# Patient Record
Sex: Female | Born: 1990 | Race: Black or African American | Hispanic: No | Marital: Single | Smoking: Former smoker
Health system: Southern US, Community
[De-identification: ages and names within clinical notes are randomized; demographics above are authoritative.]

## PROBLEM LIST (undated history)

## (undated) DIAGNOSIS — R12 Heartburn: Secondary | ICD-10-CM

## (undated) DIAGNOSIS — G47411 Narcolepsy with cataplexy: Secondary | ICD-10-CM

## (undated) DIAGNOSIS — K219 Gastro-esophageal reflux disease without esophagitis: Secondary | ICD-10-CM

## (undated) DIAGNOSIS — E6609 Other obesity due to excess calories: Secondary | ICD-10-CM

## (undated) DIAGNOSIS — G4730 Sleep apnea, unspecified: Secondary | ICD-10-CM

## (undated) DIAGNOSIS — R0602 Shortness of breath: Secondary | ICD-10-CM

## (undated) DIAGNOSIS — F419 Anxiety disorder, unspecified: Secondary | ICD-10-CM

## (undated) DIAGNOSIS — D649 Anemia, unspecified: Secondary | ICD-10-CM

## (undated) DIAGNOSIS — Z72821 Inadequate sleep hygiene: Secondary | ICD-10-CM

## (undated) DIAGNOSIS — R079 Chest pain, unspecified: Secondary | ICD-10-CM

## (undated) DIAGNOSIS — G4710 Hypersomnia, unspecified: Secondary | ICD-10-CM

## (undated) DIAGNOSIS — M549 Dorsalgia, unspecified: Secondary | ICD-10-CM

## (undated) DIAGNOSIS — F32A Depression, unspecified: Secondary | ICD-10-CM

## (undated) DIAGNOSIS — R609 Edema, unspecified: Secondary | ICD-10-CM

## (undated) DIAGNOSIS — D689 Coagulation defect, unspecified: Secondary | ICD-10-CM

## (undated) DIAGNOSIS — F329 Major depressive disorder, single episode, unspecified: Secondary | ICD-10-CM

## (undated) DIAGNOSIS — K5900 Constipation, unspecified: Secondary | ICD-10-CM

## (undated) DIAGNOSIS — F909 Attention-deficit hyperactivity disorder, unspecified type: Secondary | ICD-10-CM

## (undated) DIAGNOSIS — Z86718 Personal history of other venous thrombosis and embolism: Secondary | ICD-10-CM

## (undated) HISTORY — DX: Anxiety disorder, unspecified: F41.9

## (undated) HISTORY — DX: Other obesity due to excess calories: E66.09

## (undated) HISTORY — DX: Hypersomnia, unspecified: G47.10

## (undated) HISTORY — DX: Coagulation defect, unspecified: D68.9

## (undated) HISTORY — DX: Personal history of other venous thrombosis and embolism: Z86.718

## (undated) HISTORY — DX: Sleep apnea, unspecified: G47.30

## (undated) HISTORY — DX: Constipation, unspecified: K59.00

## (undated) HISTORY — DX: Chest pain, unspecified: R07.9

## (undated) HISTORY — DX: Narcolepsy with cataplexy: G47.411

## (undated) HISTORY — DX: Edema, unspecified: R60.9

## (undated) HISTORY — DX: Anemia, unspecified: D64.9

## (undated) HISTORY — DX: Shortness of breath: R06.02

## (undated) HISTORY — DX: Heartburn: R12

## (undated) HISTORY — DX: Dorsalgia, unspecified: M54.9

## (undated) HISTORY — PX: OTHER SURGICAL HISTORY: SHX169

## (undated) HISTORY — DX: Inadequate sleep hygiene: Z72.821

---
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
## 2008-01-20 ENCOUNTER — Telehealth: Payer: Self-pay | Admitting: *Deleted

## 2008-01-21 ENCOUNTER — Ambulatory Visit: Payer: Self-pay | Admitting: Family Medicine

## 2008-01-21 ENCOUNTER — Encounter (INDEPENDENT_AMBULATORY_CARE_PROVIDER_SITE_OTHER): Payer: Self-pay | Admitting: Family Medicine

## 2008-01-28 ENCOUNTER — Ambulatory Visit: Payer: Self-pay | Admitting: Internal Medicine

## 2008-03-01 ENCOUNTER — Encounter: Payer: Self-pay | Admitting: Internal Medicine

## 2008-03-15 ENCOUNTER — Ambulatory Visit: Payer: Self-pay | Admitting: Internal Medicine

## 2008-03-15 LAB — CONVERTED CEMR LAB
T4, Total: 9.6 ug/dL (ref 5.0–12.5)
TSH: 1.17 microintl units/mL (ref 0.35–5.50)

## 2008-07-15 ENCOUNTER — Ambulatory Visit: Payer: Self-pay | Admitting: Internal Medicine

## 2008-09-19 ENCOUNTER — Telehealth: Payer: Self-pay | Admitting: Internal Medicine

## 2009-03-17 ENCOUNTER — Inpatient Hospital Stay (HOSPITAL_COMMUNITY): Admission: AD | Admit: 2009-03-17 | Discharge: 2009-03-19 | Payer: Self-pay | Admitting: Family Medicine

## 2009-03-17 ENCOUNTER — Telehealth: Payer: Self-pay | Admitting: Family Medicine

## 2009-03-17 ENCOUNTER — Ambulatory Visit: Payer: Self-pay | Admitting: Family Medicine

## 2009-03-17 DIAGNOSIS — N920 Excessive and frequent menstruation with regular cycle: Secondary | ICD-10-CM | POA: Insufficient documentation

## 2009-03-17 LAB — CONVERTED CEMR LAB: Hemoglobin: 4.6 g/dL

## 2009-03-23 ENCOUNTER — Telehealth (INDEPENDENT_AMBULATORY_CARE_PROVIDER_SITE_OTHER): Payer: Self-pay | Admitting: Family Medicine

## 2009-03-24 ENCOUNTER — Ambulatory Visit: Payer: Self-pay | Admitting: Family Medicine

## 2009-03-24 DIAGNOSIS — K219 Gastro-esophageal reflux disease without esophagitis: Secondary | ICD-10-CM

## 2009-03-27 ENCOUNTER — Ambulatory Visit (HOSPITAL_COMMUNITY): Admission: RE | Admit: 2009-03-27 | Discharge: 2009-03-27 | Payer: Self-pay | Admitting: Family Medicine

## 2009-03-28 ENCOUNTER — Encounter (INDEPENDENT_AMBULATORY_CARE_PROVIDER_SITE_OTHER): Payer: Self-pay | Admitting: Family Medicine

## 2009-10-27 ENCOUNTER — Encounter: Payer: Self-pay | Admitting: Family Medicine

## 2009-10-27 ENCOUNTER — Ambulatory Visit: Payer: Self-pay | Admitting: Family Medicine

## 2009-10-27 LAB — CONVERTED CEMR LAB
Hemoglobin: 12.3 g/dL
TSH: 1.349 microintl units/mL (ref 0.700–6.400)

## 2009-12-27 ENCOUNTER — Ambulatory Visit: Payer: Self-pay | Admitting: Family Medicine

## 2010-02-13 ENCOUNTER — Encounter: Payer: Self-pay | Admitting: Family Medicine

## 2010-02-14 ENCOUNTER — Ambulatory Visit: Payer: Self-pay | Admitting: Family Medicine

## 2010-02-14 ENCOUNTER — Encounter: Payer: Self-pay | Admitting: Family Medicine

## 2010-02-14 LAB — CONVERTED CEMR LAB
Barbiturate Quant, Ur: NEGATIVE
Creatinine,U: 89.1 mg/dL
Ethyl Alcohol: <10 mg/dL (ref <10)
Methadone: NEGATIVE
Opiate Screen, Urine: NEGATIVE

## 2010-03-05 ENCOUNTER — Telehealth: Payer: Self-pay | Admitting: *Deleted

## 2010-03-09 ENCOUNTER — Encounter: Payer: Self-pay | Admitting: Family Medicine

## 2010-03-09 ENCOUNTER — Ambulatory Visit: Payer: Self-pay | Admitting: Family Medicine

## 2010-03-09 DIAGNOSIS — D500 Iron deficiency anemia secondary to blood loss (chronic): Principal | ICD-10-CM | POA: Diagnosis present

## 2010-03-09 DIAGNOSIS — D509 Iron deficiency anemia, unspecified: Secondary | ICD-10-CM | POA: Insufficient documentation

## 2010-03-09 DIAGNOSIS — R358 Other polyuria: Secondary | ICD-10-CM

## 2010-03-13 LAB — CONVERTED CEMR LAB
Ferritin: 11 ng/mL (ref 10–291)
Hemoglobin: 9.2 g/dL — ABNORMAL LOW (ref 12.0–15.0)
MCHC: 31.1 g/dL (ref 30.0–36.0)
MCV: 72.5 fL — ABNORMAL LOW (ref 78.0–100.0)
RBC: 4.08 M/uL (ref 3.87–5.11)
TIBC: 308 ug/dL (ref 250–470)
UIBC: 294 ug/dL
WBC: 6.9 10*3/uL (ref 4.0–10.5)

## 2010-03-28 ENCOUNTER — Telehealth: Payer: Self-pay | Admitting: Family Medicine

## 2010-04-11 ENCOUNTER — Ambulatory Visit: Payer: Self-pay | Admitting: Family Medicine

## 2010-04-11 DIAGNOSIS — J309 Allergic rhinitis, unspecified: Secondary | ICD-10-CM

## 2010-04-21 ENCOUNTER — Ambulatory Visit: Payer: Self-pay | Admitting: Internal Medicine

## 2010-04-23 ENCOUNTER — Telehealth: Payer: Self-pay | Admitting: Family Medicine

## 2010-04-24 ENCOUNTER — Ambulatory Visit (HOSPITAL_BASED_OUTPATIENT_CLINIC_OR_DEPARTMENT_OTHER): Admission: RE | Admit: 2010-04-24 | Discharge: 2010-04-24 | Payer: Self-pay | Admitting: Internal Medicine

## 2010-04-24 ENCOUNTER — Ambulatory Visit: Payer: Self-pay | Admitting: Internal Medicine

## 2010-05-21 ENCOUNTER — Telehealth: Payer: Self-pay | Admitting: Family Medicine

## 2010-05-25 ENCOUNTER — Ambulatory Visit: Payer: Self-pay | Admitting: Internal Medicine

## 2010-06-28 ENCOUNTER — Ambulatory Visit: Payer: Self-pay | Admitting: Internal Medicine

## 2010-06-29 ENCOUNTER — Telehealth (INDEPENDENT_AMBULATORY_CARE_PROVIDER_SITE_OTHER): Payer: Self-pay | Admitting: *Deleted

## 2010-07-05 ENCOUNTER — Encounter: Payer: Self-pay | Admitting: Internal Medicine

## 2010-07-05 ENCOUNTER — Ambulatory Visit: Payer: Self-pay | Admitting: Family Medicine

## 2010-07-05 DIAGNOSIS — J069 Acute upper respiratory infection, unspecified: Secondary | ICD-10-CM | POA: Insufficient documentation

## 2010-08-28 ENCOUNTER — Telehealth: Payer: Self-pay | Admitting: Internal Medicine

## 2011-01-17 NOTE — Progress Notes (Signed)
Summary: refill  Phone Note Refill Request Call back at [phone] Message from:  Ms Sharee Pimple  Refills Requested: Medication #1:  METHYLPHENIDATE HCL CR 20 MG  TBCR 3 tabs Initial call taken by: De Nurse,  Apr 23, 2010 11:13 AM  Follow-up for Phone Call        will leave up front by noon today Follow-up by: Asher Muir MD,  Apr 24, 2010 10:10 AM  Additional Follow-up for Phone Call Additional follow up Details #1::        informed Ms Sharee Pimple Additional Follow-up by: De Nurse,  Apr 24, 2010 4:44 PM    Prescriptions: METHYLPHENIDATE HCL CR 20 MG  TBCR (METHYLPHENIDATE HCL) 3 tabs, twice daily as needed Brand medically necessary #60 x 0   Entered and Authorized by:   Asher Muir MD   Signed by:   Asher Muir MD on 04/24/2010   Method used:   Print then Give to Patient   RxID:   [id number]

## 2011-01-17 NOTE — Progress Notes (Signed)
Summary: Rx Req  Phone Note Refill Request Call back at [phone] Message from:  Patient  Refills Requested: Medication #1:  METHYLPHENIDATE HCL CR 20 MG  TBCR 3 tabs Initial call taken by: Clydell Hakim,  May 21, 2010 8:52 AM  Follow-up for Phone Call        Prescription placed at front desk.  Please call to inform patient. Follow-up by: Romero Belling MD,  May 21, 2010 1:36 PM  Additional Follow-up for Phone Call Additional follow up Details #1::        pt called in and informed it was ready Additional Follow-up by: Knox Royalty,  May 21, 2010 2:23 PM    Prescriptions: METHYLPHENIDATE HCL CR 20 MG  TBCR (METHYLPHENIDATE HCL) 3 tabs, twice daily as needed Brand medically necessary #60 x 0   Entered and Authorized by:   Romero Belling MD   Signed by:   Romero Belling MD on 05/21/2010   Method used:   Print then Give to Patient   RxID:   [id number]

## 2011-01-17 NOTE — Miscellaneous (Signed)
Summary: RX Refill  Patient stopped in and needs a refill of Methylphenidate.  She uses [facility] on [address].  Her phone number is [phone]. Bradly Bienenstock  February 13, 2010 1:12 PM  Will refill meds when back in office.  She takes Methylphenidate for Narcolepsy.  Dr. Roxy Cedar note from 06/2008 indicates he felt she needed a repeat sleep study in a couple of years.  This is due at this point, so will set up for patient.  Romero Belling MD  February 13, 2010 9:22 PM  Would like patient to come to clinic to sign Controlled Substances Contract if she is going to have these medicines filled regularly from now on.  Called number above and left message for patient to call the office.  Patient just called back and we discussed Controlled Substances Contract--she is agreeable and will come to my office at 4 pm today for clinic visit to go over contract.  To Admin Team to schedule visit.  ADMIN TEAM:  Please put Ms. Messineo on my schedule for 4 pm today.  Okay to double book.  Visit is for:  "Controlled Substances Contract, Sleep Study."  Thank you. Romero Belling MD  February 14, 2010 9:00 AM

## 2011-01-17 NOTE — Progress Notes (Signed)
Summary: nos appt  Phone Note Call from Patient   Caller: [email or web address]  Call For: Andrika Peraza Summary of Call: In ref to nos from 9/12, pt will rsc at a later date. Initial call taken by: Darletta Moll,  August 28, 2010 9:13 AM
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
## 2011-01-17 NOTE — Assessment & Plan Note (Signed)
Summary: Controlled Substances Contract, Sleep Study,df   Vital Signs:  Patient profile:   20 year old female Height:      62.5 inches Weight:      318 pounds BMI:     57.44 BSA:     2.34 Temp:     98.1 degrees F Pulse rate:   111 / minute BP sitting:   126 / 82  Vitals Entered By: Jone Baseman CMA (February 14, 2010 4:31 PM) CC: Controlled Substances Contract, Sleep Study Is Patient Diabetic? No Pain Assessment Patient in pain? no        Primary Care Provider:  Romero Belling MD  CC:  Controlled Substances Contract and Sleep Study.  History of Present Illness: 20 yo female presenting for refill of Methylphenidate, which she takes for Narcolepsy and apparently ADHD, though that history is less clear.  Review of notes indicates has been seen by sleep medicine specialist in the past who felt she would need a re-evaluation in the future.  Methylphenidate is prescribed for up to two times a day, but she is taking typically once daily.  Habits & Providers  Alcohol-Tobacco-Diet     Tobacco Status: never  Current Problems (verified): 1)  Narcolepsy w/o Cataplexy  (ICD-347.00) 2)  Esophageal Reflux  (ICD-530.81) 3)  Obesity, Morbid  (ICD-278.01) 4)  Menorrhagia  (ICD-626.2) 5)  Depressive Disorder, Nos  (ICD-311) 6)  Attention Deficit, w/o Hyperactivity  (ICD-314.00)  Current Medications (verified): 1)  Methylphenidate Hcl Cr 20 Mg  Tbcr (Methylphenidate Hcl) .... 3 Tabs, Twice Daily As Needed 2)  Omeprazole 20 Mg Cpdr (Omeprazole) .Marland Kitchen.. 1 Tab By Mouth Daily For Acid Reflux  Allergies (verified): No Known Drug Allergies  Physical Exam  General:  Alert and oriented x3, obese, no acute distress Psych:  flat affect.     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  Contract is signed today and will be scanned into EMR.  UDS  today as well.  Discussed need for re-evaluation of narcolepsy with patient who is agreeable to referral to sleep clinic--will arrange today.  Medications filled x1 month. Orders: Miscellaneous Lab Charge-[facility] [phone]) [facility]- Est Level  2 (60454) Sleep Disorder Referral (Sleep Disorder)  Complete Medication List: 1)  Methylphenidate Hcl Cr 20 Mg Tbcr (Methylphenidate hcl) .... 3 tabs, twice daily as needed 2)  Omeprazole 20 Mg Cpdr (Omeprazole) .Marland Kitchen.. 1 tab by mouth daily for acid reflux  Patient Instructions: 1)  We'll call with sleep appointment. 2)  Please schedule a follow-up appointment in 3 months.  Prescriptions: METHYLPHENIDATE HCL CR 20 MG  TBCR (METHYLPHENIDATE HCL) 3 tabs, twice daily as needed Brand medically necessary #60 x 0   Entered and Authorized by:   Romero Belling MD   Signed by:   Romero Belling MD on 02/14/2010   Method used:   Print then Give to Patient   RxID:   [id number]

## 2011-01-17 NOTE — Miscellaneous (Signed)
Summary: [facility] Controlled Substance Contract  [facility] Controlled Substance Contract   Imported By: Clydell Hakim 02/16/2010 12:08:44  _____________________________________________________________________  External Attachment:    Type:   Image     Comment:   External Document
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
## 2011-01-17 NOTE — Progress Notes (Signed)
Summary: Nuvigil Prior Auth----approved!!  Phone Note Outgoing Call   Call placed by: Gweneth Dimitri RN,  June 29, 2010 11:53 AM Summary of Call: Received request from pharmacy to initiate prior auth for Nuvigil 250mg .  Called [employer] to initiate this.  Awaiting fax to traige.   Initial call taken by: Gweneth Dimitri RN,  June 29, 2010 11:54 AM  Follow-up for Phone Call        PA has been faxed and in awaiting answer stack in Triage.Reynaldo Minium CMA  June 29, 2010 1:54 PM   Additional Follow-up for Phone Call Additional follow up Details #1::        received paperwork back from [facility] company approving Nuvigil.  Arman Filter LPN  July 02, 2010 5:26 PM

## 2011-01-17 NOTE — Assessment & Plan Note (Signed)
Summary: fu/kh   Vital Signs:  Patient profile:   20 year old female Height:      62.5 inches Weight:      324.8 pounds BMI:     58.67 Temp:     98.3 degrees F oral Pulse rate:   96 / minute BP sitting:   95 / 60  (left arm) Cuff size:   large  Vitals Entered By: Garen Grams LPN (April 11, 2010 11:35 AM) CC: f/u menorrhagia Is Patient Diabetic? No Pain Assessment Patient in pain? no        Primary Care Provider:  Romero Belling MD  CC:  f/u menorrhagia.  History of Present Illness: Seen 1 month ago for menorrhagia and found to be anemic.  Took OCPs as directed:  2 daily x3 days, then 1 daily.  Period had been going on for 3 weeks when she was seen last time, but stopped when she started the OCPs.  Started again sometime later (unsure when) and has been going on for approximately 2 weeks now, approximately 3 pads per day, which she says is like last time.  Is also taking Fe two times a day.  Feels nauseated on the new medicines.  Not sexually active.  Habits & Providers  Alcohol-Tobacco-Diet     Tobacco Status: never  Allergies (verified): No Known Drug Allergies  Review of Systems       Per HPI.  Physical Exam  General:  Well-developed,well-nourished,in no acute distress; alert,appropriate and cooperative throughout examination Abdomen:  Bowel sounds positive,abdomen soft and non-tender without masses, organomegaly or hernias noted.   Impression & Recommendations:  Problem # 1:  MENORRHAGIA (ICD-626.2) Assessment Unchanged  Will do trial of OCPs as follows.  Advised that Mirena would be a good option--patient declines. Her updated medication list for this problem includes:    Ortho-cyclen (28) 0.25-35 Mg-mcg Tabs (Norgestimate-eth estradiol) .Marland KitchenMarland KitchenMarland KitchenMarland Kitchen 3 tab by mouth daily for one week on the first 7 days of the month for the next 3 months. disp #1 pack  Orders: [facility]- Est Level  3 (16109)  Problem # 2:  ANEMIA (ICD-285.9) Assessment: Unchanged  Iron deficient  secondary to menorrhagia.  Continue Fe supplement. Her updated medication list for this problem includes:    Ferrous Sulfate 325 (65 Fe) Mg Tabs (Ferrous sulfate) .Marland Kitchen..Marland Kitchen Two times a day  Hgb: 9.2 (03/09/2010)   Hct: 29.6 (03/09/2010)   Platelets: 351 (03/09/2010) RBC: 4.08 (03/09/2010)   RDW: 16.9 (03/09/2010)   WBC: 6.9 (03/09/2010) MCV: 72.5 (03/09/2010)   MCHC: 31.1 (03/09/2010) Ferritin: 11 (03/09/2010) Iron: 14 (03/09/2010)   TIBC: 308 (03/09/2010)   % Sat: 5 (03/09/2010) TSH: 1.349 (10/27/2009)  Orders: [facility]- Est Level  3 (60454)  Complete Medication List: 1)  Methylphenidate Hcl Cr 20 Mg Tbcr (Methylphenidate hcl) .... 3 tabs, twice daily as needed 2)  Omeprazole 20 Mg Cpdr (Omeprazole) .Marland Kitchen.. 1 tab by mouth daily for acid reflux 3)  Ortho-cyclen (28) 0.25-35 Mg-mcg Tabs (Norgestimate-eth estradiol) .... 3 tab by mouth daily for one week on the first 7 days of the month for the next 3 months. disp #1 pack 4)  Ferrous Sulfate 325 (65 Fe) Mg Tabs (Ferrous sulfate) .... Two times a day 5)  Naprosyn 500 Mg Tabs (Naproxen) .... One tab two times a day as needed for headache  Patient Instructions: 1)  Stop taking the other birth control pills. 2)  Take the new ones I just prescribed:  3 tabs daily for 1 week beginning May 1.  Do this again on June 1, and again on July 1. 3)  Consider having a Mirena IUD inserted. 4)  Please schedule a follow-up appointment in 2 months.  Prescriptions: ORTHO-CYCLEN (28) 0.25-35 MG-MCG TABS (NORGESTIMATE-ETH ESTRADIOL) 3 tab by mouth daily for one week on the first 7 days of the month for the next 3 months. Disp #1 pack  #1 x 2   Entered and Authorized by:   Romero Belling MD   Signed by:   Romero Belling MD on 04/11/2010   Method used:   [facility]        [facility] [address] [phone]* (retail)       [address]       [place], [place]  [address]       Ph: [phone] or [phone]       Fax: [phone]   RxID:    [id number]   Appended Document: fu/kh     Allergies: No Known Drug Allergies   Impression & Recommendations:  Problem # 1:  ALLERGIC RHINITIS (ICD-477.9)  Patient asks for medicine for allergies as she walks out the door today.  It's in her nose and throat.  Not evaluated.  Rx for Cetirizine sent.  Her updated medication list for this problem includes:    Cetirizine Hcl 10 Mg Tabs (Cetirizine hcl) .Marland Kitchen... 1 tab by mouth daily for allergies  Complete Medication List: 1)  Methylphenidate Hcl Cr 20 Mg Tbcr (Methylphenidate hcl) .... 3 tabs, twice daily as needed 2)  Omeprazole 20 Mg Cpdr (Omeprazole) .Marland Kitchen.. 1 tab by mouth daily for acid reflux 3)  Ortho-cyclen (28) 0.25-35 Mg-mcg Tabs (Norgestimate-eth estradiol) .... 3 tab by mouth daily for one week on the first 7 days of the month for the next 3 months. disp #1 pack 4)  Ferrous Sulfate 325 (65 Fe) Mg Tabs (Ferrous sulfate) .... Two times a day 5)  Naprosyn 500 Mg Tabs (Naproxen) .... One tab two times a day as needed for headache 6)  Cetirizine Hcl 10 Mg Tabs (Cetirizine hcl) .Marland Kitchen.. 1 tab by mouth daily for allergies Prescriptions: CETIRIZINE HCL 10 MG TABS (CETIRIZINE HCL) 1 tab by mouth daily for allergies  #30 x 6   Entered and Authorized by:   Romero Belling MD   Signed by:   Romero Belling MD on 04/11/2010   Method used:   [facility]ron[facility] to        [facility] [address] [phone]* (retail)       [address]       [place], [place]  [address]       Ph: [phone] or [phone]       Fax: [phone]   RxID:   [id number]
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
## 2011-01-17 NOTE — Progress Notes (Signed)
Summary: Cancel Sleep Study  Phone Note Call from Patient Call back at [phone]   Caller: Patient Summary of Call: Would like to cancel sleep study and have it rescheduled. Initial call taken by: Clydell Hakim,  March 05, 2010 2:56 PM  Follow-up for Phone Call        Called patient and gave her the number to reschedule this herself. Follow-up by: Garen Grams LPN,  March 05, 2010 3:25 PM

## 2011-01-17 NOTE — Progress Notes (Signed)
Summary: Rx Req  Phone Note Refill Request Call back at [phone] Message from:  Caregiver-Blondine  Refills Requested: Medication #1:  METHYLPHENIDATE HCL CR 20 MG  TBCR 3 tabs Initial call taken by: Clydell Hakim,  March 28, 2010 9:55 AM  Follow-up for Phone Call        Rx at front desk. Follow-up by: Romero Belling MD,  March 28, 2010 10:09 AM    Prescriptions: METHYLPHENIDATE HCL CR 20 MG  TBCR (METHYLPHENIDATE HCL) 3 tabs, twice daily as needed Brand medically necessary #60 x 0   Entered and Authorized by:   Romero Belling MD   Signed by:   Romero Belling MD on 03/28/2010   Method used:   Print then Give to Patient   RxID:   [id number]   Appended Document: Rx Req notified rx was ready to be picked up.

## 2011-01-17 NOTE — Assessment & Plan Note (Signed)
Summary: headache,df   Vital Signs:  Patient profile:   20 year old female Height:      62.5 inches Weight:      320.4 pounds BMI:     57.88 Temp:     98.2 degrees F oral Pulse rate:   90 / minute Resp:     18 per minute BP sitting:   128 / 80  Primary Care Provider:  Romero Belling MD   History of Present Illness: Vaginal bleeding for one month, feeling fatigued and has had a headache for over one week.  Past hospitalization for anemia, and was started on OCP to regulate menses she quit taking because she could not remember.   Living in a transitional home, the house mother was here and very positive, reported that [name] does very little except eat and sleep.  She occasionally walks to the corner store to buy a 2 liter soda and drinks it all.  Reports a significant history of depression, not currently on antidepressants.  Is on stimulants for 'narcolepsy" that she does not take regularly.  Just spent a week with her mother.  She denies any sexual acitivity.  Current Medications (verified): 1)  Methylphenidate Hcl Cr 20 Mg  Tbcr (Methylphenidate Hcl) .... 3 Tabs, Twice Daily As Needed 2)  Omeprazole 20 Mg Cpdr (Omeprazole) .Marland Kitchen.. 1 Tab By Mouth Daily For Acid Reflux 3)  Loestrin 24 Fe 1-20 Mg-Mcg Tabs (Norethin Ace-Eth Estrad-Fe) .... One Tab Two Times A Day For 3 Days Then One Tab Daily, Please Stay On The Pill To Contol Bleeding 4)  Ferrous Sulfate 325 (65 Fe) Mg Tabs (Ferrous Sulfate) .... Two Times A Day 5)  Naprosyn 500 Mg Tabs (Naproxen) .... One Tab Two Times A Day As Needed For Headache  Allergies (verified): No Known Drug Allergies  Review of Systems General:  Complains of fatigue and malaise. Neuro:  Complains of headaches.  Physical Exam  General:  Lethargic, morbidly obese 20 year old. Eyes:  pale lid conjunctive Mouth:  moist Lungs:  normal respiratory effort and normal breath sounds.   Heart:  rate 90, regular   Impression & Recommendations:  Problem #  1:  MENORRHAGIA (ICD-626.2) Restart OCP, add low estrogen with iron, check indicies-hbg was 9.9 today, add iron for 6 months Her updated medication list for this problem includes:    Loestrin 24 Fe 1-20 Mg-mcg Tabs (Norethin ace-eth estrad-fe) ..... 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 3 tabs, twice daily as needed 2)  Omeprazole 20 Mg Cpdr (Omeprazole) .Marland Kitchen.. 1 tab by mouth daily for acid reflux 3)  Loestrin 24 Fe 1-20 Mg-mcg Tabs (Norethin ace-eth estrad-fe) .... One tab two times a day for 3 days then one tab daily, please stay on the pill to contol  bleeding 4)  Ferrous Sulfate 325 (65 Fe) Mg Tabs (Ferrous sulfate) .... Two times a day 5)  Naprosyn 500 Mg Tabs (Naproxen) .... One tab two times a day as needed for headache  Patient Instructions: 1)  It is important that you take the hormone to control your bleeding, low blood count is dangerous and if it continue could hurt you 2)  I am sure that you feel bad because of this low blood count and it could be causing the headache 3)  You have medicine for your headache 4)  you need to take iron for 6 months. 5)  Follow up with primary MD in one month Prescriptions: NAPROSYN 500 MG TABS (NAPROXEN) one tab two times a day  as needed for headache  #60 x 1   Entered and Authorized by:   Luretha Murphy NP   Signed by:   Luretha Murphy NP on 03/09/2010   Method used:   Electronically to        [facility] [address] [phone]* (retail)       [address]       [place], [place]  [address]       Ph: [phone] or [phone]       Fax: [phone]   RxID:   [id number] FERROUS SULFATE 325 (65 FE) MG TABS (FERROUS SULFATE) two times a day  #60 x 5   Entered and Authorized by:   Luretha Murphy NP   Signed by:   Luretha Murphy NP on 03/09/2010   Method used:   Electronically to        [facility] [address] [phone]* (retail)       [address]       [place], [place]  [address]       Ph: [phone] or [phone]       Fax: [phone]   RxID:   [id number] LOESTRIN 24 FE 1-20 MG-MCG TABS (NORETHIN ACE-ETH ESTRAD-FE) one tab two times a day for 3 days then one tab daily, please stay on the pill to contol bleeding  #1 x 11   Entered and Authorized by:   Luretha Murphy NP   Signed by:   Luretha Murphy NP on 03/09/2010   Method used:   Electronically to        [facility] [address] [phone]* (retail)       [address]       [place], [place]  [address]       Ph: [phone] or [phone]       Fax: [phone]   RxID:   [id number]   Laboratory Results   Blood Tests   Date/Time Received: March 09, 2010 4:08 PM  Date/Time Reported: March 09, 2010 4:16 PM     CBC   HGB:  9.9 g/dL   (Normal Range: 78.9-38.1 in Males, 12.0-15.0 in Females) Comments: ...........test performed by...........Marland KitchenTerese Door, CMA

## 2011-01-17 NOTE — Medication Information (Signed)
Summary: Tax adviser   Imported By: Valinda Hoar 07/05/2010 08:22:57  _____________________________________________________________________  External Attachment:    Type:   Image     Comment:   External Document

## 2011-03-11 LAB — GLUCOSE, CAPILLARY: Glucose-Capillary: 91 mg/dL (ref 70–99)

## 2011-03-27 LAB — CROSSMATCH

## 2011-03-27 LAB — PREGNANCY, URINE: Preg Test, Ur: NEGATIVE

## 2011-03-27 LAB — DIFFERENTIAL
Basophils Absolute: 0.1 10*3/uL (ref 0.0–0.1)
Eosinophils Absolute: 0.2 10*3/uL (ref 0.0–0.7)
Eosinophils Relative: 3 % (ref 0–5)
Lymphocytes Relative: 34 % (ref 12–46)
Lymphs Abs: 2.9 10*3/uL (ref 0.7–4.0)
Monocytes Absolute: 0.7 10*3/uL (ref 0.1–1.0)

## 2011-03-27 LAB — COMPREHENSIVE METABOLIC PANEL
ALT: <8 U/L (ref 0–35)
AST: 13 U/L (ref 0–37)
Alkaline Phosphatase: 99 U/L (ref 39–117)
CO2: 25 mEq/L (ref 19–32)
Chloride: 109 mEq/L (ref 96–112)
GFR calc Af Amer: >60 mL/min (ref >60)
GFR calc non Af Amer: >60 mL/min (ref >60)
Glucose, Bld: 92 mg/dL (ref 70–99)
Sodium: 140 mEq/L (ref 135–145)
Total Bilirubin: 0.5 mg/dL (ref 0.3–1.2)

## 2011-03-27 LAB — CBC
HCT: 20.2 % — ABNORMAL LOW (ref 36.0–46.0)
HCT: 20.5 % — ABNORMAL LOW (ref 36.0–46.0)
Hemoglobin: 4.2 g/dL — CL (ref 12.0–15.0)
Hemoglobin: 6.2 g/dL — CL (ref 12.0–15.0)
Hemoglobin: 6.3 g/dL — CL (ref 12.0–15.0)
MCHC: 28.6 g/dL — ABNORMAL LOW (ref 30.0–36.0)
MCHC: 30.9 g/dL (ref 30.0–36.0)
MCHC: 32.6 g/dL (ref 30.0–36.0)
MCV: 61.5 fL — ABNORMAL LOW (ref 78.0–100.0)
MCV: 65.7 fL — ABNORMAL LOW (ref 78.0–100.0)
Platelets: 307 10*3/uL (ref 150–400)
Platelets: 387 10*3/uL (ref 150–400)
RBC: 2.81 MIL/uL — ABNORMAL LOW (ref 3.87–5.11)
RDW: 37.1 % — ABNORMAL HIGH (ref 11.5–15.5)
RDW: 37.1 % — ABNORMAL HIGH (ref 11.5–15.5)
WBC: 8.8 10*3/uL (ref 4.0–10.5)

## 2011-03-27 LAB — TSH: TSH: 1.595 u[IU]/mL (ref 0.350–4.500)

## 2011-04-30 NOTE — Discharge Summary (Signed)
[name], [name]          ACCOUNT NO.:  [id number]   MEDICAL RECORD NO.:  [id number]          PATIENT TYPE:  INP   LOCATION:  5507                         FACILITY:  [facility]   PHYSICIAN:  Wayne A. Sheffield Slider, M.D.    DATE OF BIRTH:  1991/11/04   DATE OF ADMISSION:  03/17/2009  DATE OF DISCHARGE:  03/19/2009                               DISCHARGE SUMMARY   PRIMARY CARE PHYSICIAN:  Lupita Raider, MD   CONSULT:  None.   DISCHARGE DIAGNOSE:  1. Symptomatic anemia secondary to dysfunctional uterine bleeding.  2. Functional uterine bleeding.  3. Morbid obesity.  4. Narcolepsy.   DISCHARGE MEDICATIONS:  1. Provera 10 mg p.o. daily until March 31, 2009.  2. Sprintec oral contraceptive pills one daily as directed starting      April 09, 2009.  3. Ferrous sulfate 325 mg p.o. b.i.d.  4. Methylphenidate 60 mg p.o. b.i.d. p.r.n.   HOSPITAL COURSE:  Please see dictated H&P for details of her admission.  Briefly, this is a 20 year old morbidly obese African American female  with no known history of menorrhagia or dysfunctional uterine bleeding  who presented to clinic with a hemoglobin of 4.6 and some dizziness and  fatigue.  She did report bleeding vaginally for approximately 5 months  straight.  She was not taking any birth control pills at that time and  has no history of fibroids.  She was admitted to the hospital and given  4 units of packed red blood cells which increased her hemoglobin to 68.9  along with IV iron dose per pharmacy.  She was started on Provera 10 mg  p.o. daily and by day of discharge she had minimal to no vaginal  bleeding.  She was feeling better from an energy standpoint. Plan is to  have her continue the Provera for 14-day course, have a withdrawal  bleeding and start on Sprintec oral contraceptive pills daily for  approximately 3 months.  She will also need an outpatient transvaginal  ultrasound to assess for fibroids or masses as her body habitus is so  large that that is not possible to palpate her uterus on exam.  We will  have the [facility] set this up and call the patient with  this appointment.  They will also call her to set her up with a followup  appointment with her primary care physician, Dr. Lupita Raider, in 1-2  months to ensure  that the bleeding has stopped and she is tolerating oral contraceptive  pills.  Of note, it was explained to the patient her increased risk of  blood clots on oral contraceptive pills given her morbid obesity.  She  was counseled on smoking and counseled on the red flags to watch for  DVTs.      Lupita Raider, M.D.  Electronically Signed      Wayne A. Sheffield Slider, M.D.  Electronically Signed    KS/MEDQ  D:  03/19/2009  T:  03/19/2009  Job:  [id number]

## 2011-04-30 NOTE — Assessment & Plan Note (Signed)
[facility]                             PULMONARY OFFICE NOTE   NAME:Yiu, TAMEYA KUZNIA                 MRN:          [id number]  DATE:08/25/2007                            DOB:          11-20-1991    PROBLEMS:  1. Narcolepsy syndrome.  2. Severe daytime somnolence.  3. Poor sleep hygiene.  4. Exogenous obesity.   HISTORY:  This young lady returns for followup with her stepmother.  She  had a polysomnogram in October of 2006, recording an Epworth Sleepiness  Score of 20/24, an AHI of 5.3, with moderate snoring and bruxism.  She  subsequently had a multiple sleep latency test with a mean sleep latency  of 0 and a mean REM latency of only 0.3 minutes, REM in all 5 naps and  marked difficulty keeping her awake between naps.  Her difficult social  environment has been detailed in earlier notes.  She comes now for  followup, having been using 20 mg sustained release Ritalin, 6 daily,  saying this is very well tolerated.  She stopped Zoloft which did not  seem to help, and her mother had taken her out of counseling for the  same reason.  Mother also says she really cannot appreciate much  difference at home on the current regimen which is usually dosed out as  3 tablets in the morning and 3 at lunch.  They deny overstimulation,  palpitations, mood changes, or other acute events.  She is still at  [employer] in 12th grade, but school work barely gets  done.   MEDICATIONS:  Zoloft was discontinued.  She takes only the Ritalin.   ALLERGIES:  NO MEDICATION ALLERGY.   OBJECTIVE:  VITAL SIGNS:  Weight 286 pounds, blood pressure 108/70,  pulse 83, room air saturation 100%.  GENERAL:  Blank affect.  Slow responses.  Obese and passive.  No tremor.   IMPRESSION:  Severe excessive somnolence.  I am sure there is a  significant psychosocial component, but I am also concerned that her  somnolence better fits the etiopathic pattern in that it is  extremely  unresponsive to medication.  We had considered Xyrem in the past, but I  was not sure if Medicaid could help with that.  I would like to at least  offer her a second opinion, and we are going to see if we can get her an  appointment with Dr. Vickey Huger to look at her from more of a neurologic  perspective.   PLAN:  1. We have reemphasized the importance of basic good sleep hygiene.  2. We will continue to refill Ritalin as she is followed here or until      a change is made.  3. Question when to repeat a sleep study.  4. She is given print material on sleep hygiene.  5. She will return her p.r.n.  In the meanwhile, an appointment is      made with Dr. Vickey Huger.  A [facility] authorization for      medication at school form is filled out.     Clinton D. Maple Hudson, MD, FCCP,  FACP  Electronically Signed    CDY/MedQ  DD: 08/30/2007  DT: 08/31/2007  Job #: [id number]   cc:   Tawnya Crook Erenest Rasher, M.D.  Melvyn Novas, M.D.

## 2011-05-03 NOTE — Assessment & Plan Note (Signed)
[facility]                               PULMONARY OFFICE NOTE   NAME:Toney, MIMA CRANMORE                 MRN:          [id number]  DATE:10/10/2006                            DOB:          January 19, 1991    PROBLEMS:  1. Narcolepsy syndrome.  2. Severe daytime somnolence.  3. Poor sleep hygiene.  4. Exogenous obesity.   HISTORY OF PRESENT ILLNESS:  She comes again by herself.  She is a little  more alert this time and a little more interactive.  She tells me Ritalin 20  mg SR, taking two b.i.d., does help in school.  Apparently, she often  forgets to take it, and she has no idea how many pills she has left.  She  should have just about used up her supply.  I think that tells [place] something.  She does seem to feel that it helps her be more alert, even if it is not  enough.  She does not recognize any of the symptoms of over stimulation that  I described.  I cannot really get a clear description suggesting either  cataplexy or hypnagogic hallucinations.  She has had no chest pain or  palpitations.  No syncope or significant headaches.  She still sleeps poorly  at night, and I cannot tell from talking to her how much of this is due to  social issues.  Many narcoleptics have sleep equally impaired, similar to  the impairment of their daytime wakening.   MEDICATIONS:  1. Zoloft.  2. Generic Ritalin 20 mg SR.   ALLERGIES:  No medication allergies.   OBJECTIVE:  VITAL SIGNS:  Weight 280 pounds, blood pressure 102/68, pulse  regular at 86.  Room air saturation 99%.  She remains passive.  Her eyes  start to roll away a little occasionally as she is just beginning to get  drowsy.  No tremor or jerking.  Short answers to questions with little  insight.  She does not volunteer much of anything.  HEART:  Sounds are regular without murmur.  LUNGS:  Breathing is unlabored.  There is no stridor.  I cannot feel her  thyroid.   IMPRESSION:  Working impression  remains narcolepsy complicated by poor sleep  hygiene and her exogenous obesity.   PLAN:  1. I gave her some pamphlets to read and share with her family on      narcolepsy and good sleep habits.  2. Refill generic Ritalin 20 mg SR to take two in the morning and two as      she returns from      school so that she can be awake enough, hopefully, to get some homework      done.  3. Schedule return in three months but earlier p.r.n.     Clinton D. Maple Hudson, MD, FCCP, FACP    CDY/MedQ  DD: 10/11/2006  DT: 10/13/2006  Job #: [id number]   cc:   Tawnya Crook Erenest Rasher, M.D.

## 2011-05-03 NOTE — Procedures (Signed)
NAME:  [name], [name]          ACCOUNT NO.:  [id number]   MEDICAL RECORD NO.:  [id number]          PATIENT TYPE:  OUT   LOCATION:  SLEEP CENTER                 FACILITY[facility]  [facility]   PHYSICIAN:  Clinton D. Maple Hudson, M.D. DATE OF BIRTH:  [birth year]   DATE OF STUDY:  07/01/2006                            MULTIPLE SLEEP LATENCY TEST   REFERRING PHYSICIAN:  Roddie Mc B. Erenest Rasher, M.D.   INDICATION FOR STUDY:  Excessive daytime somnolence, narcolepsy.   EPWORTH SLEEPINESS SCORE:  17/24.   BMI:  45.6.  Weight 258 pounds.   HOME MEDICATIONS:  1.  Adderall 30 mg.  2.  Wellbutrin 150 mg.   Patient gives a history of late bedtimes and difficulty sleeping at night  and daytime sleepiness.  A diagnostic MPSG on September 26, 2005 had recorded  an AHI of 5.3 per hour.  History again at that time had suggested that she  tended to stay awake much of the night.  A multiple sleep latency test is  now requested to evaluate daytime somnolence.   NAP TIMES               SLEEP LATENCY           REM LATENCY  08:30 am                      0 minutes               0 minutes  10:45 am                      0 minutes               0 minutes  01:00 pm                      0 minutes               0 minutes  03:15 pm                      0 minutes               0 minutes  05:15 pm                      0 minutes               1.5 minutes    MEAN SLEEP LATENCY:  0 minutes.   MEAN REM LATENCY:  0.3 minutes.   IMPRESSIONS-RECOMMENDATIONS:  Very severe daytime hypersomnolence with  increased REM pressure.  Mean sleep latency of 0 minutes and mean REM  latency of 0.3 minutes with a total of 5 sleep onset REM events.  Note that  the technician also reported difficulty keeping the patient awake between  naps.  These results would be strongly suggestive of narcolepsy in the  appropriate clinical context, although idiopathic hyposomnolence  withdrawal from antidepressant therapy together with significant sleep  deprivation or possibly a drug effect might give similar results.  Clinical  correlation is suggested.  Is there a history of cataplexy?  Clinton D. Maple Hudson, M.D.  Diplomate, Biomedical engineer of Sleep Medicine  Electronically Signed     CDY/MEDQ  D:  07/06/2006 09:42:52  T:  07/06/2006 21:04:08  Job:  [id number]

## 2011-05-03 NOTE — Assessment & Plan Note (Signed)
[facility]                             PULMONARY OFFICE NOTE   NAME:[name], [name] STORLIE                 MRN:          [id number]  DATE:12/18/2006                            DOB:          06-13-1991    PULMONARY/SLEEP MEDICINE FOLLOWUP   PROBLEM:  1. Narcolepsy syndrome.  2. Severe daytime somnolence.  3. Poor sleep hygiene.  4. Exogenous obesity.   HISTORY:  [name] returns with her mother and 3 young children today.  The  kids pretty much took over the exam room and I had to encourage mother  to cut short a telephone call, to stay in the room, and pay attention  during our discussion.  They bring a form for [name] to get her  Ritalin at school.  I discussed having mother supervise administration  of the morning dose when [name] first gets up and mother implied  that this was more than [name] could manage.  They say there has been maybe  slight benefit but really little change apparent with generic Ritalin at  a dose of 40 mg sustained release in the morning and again in the  afternoon.  There seems low confidence that [name] is actually getting this  dose on a regular basis.  They have not noticed mood changes, headache,  or any acute symptoms that might suggest overdosing.  They do feel that  [name] gets the prescribed dose most days.  Sometimes when [name] is  sitting quietly, [name] will feel her arms and legs jerk.  [name] does not  have generalized motor activity, incontinence, or either postictal  symptoms to suggest a seizure and it sounds more as if [name] is describing  hypnic jerks.  [name] is extremely laconic in her affect and interaction.  [name] will smile some and seems fully oriented.  They show me letters  which were copied to her chart from the Counseling Department at [employer] in 2004 where the complaint was that [name]  constantly sleeps in class all day as well as at home.  No homework,  does not complete class  work, consequences do not appear to matter,  slurred speech, does not appear to socialize.  I had discussed options  of trying once more with a higher dose of Ritalin, which is likely to be  one of the few medicines that are affordable for her, or neurology  evaluation.   MEDICATIONS:  1. Zoloft.  2. Generic Ritalin 20 mg SR x2 b.i.d.   OBJECTIVE:  Weight 288 pounds.  BP 112/62.  Pulse regular 85.  Room air  saturation 98%.  Passive, sluggish.  If I speak directly to her, [name] will pay attention,  smile, and interact.  [name] ambulates appropriately and picked up and  carried her child.  I saw no tremor or jerkiness.  Pulse was regular.   IMPRESSION:  1. Narcolepsy syndrome.  2. Poor sleep hygiene.  3. I am not convinced there is not more of an organic problem.  [name]      may need recheck of thyroid function, workup for organic brain  disease, possibly a trial of Xyrem if that could be afforded      through [facility] on a support basis.   PLAN:  We are going to try once to increase generic Ritalin to 20 mg SR  x3 b.i.d. with approval for the school to give it at 9 a.m. and at 2  p.m. in the hopes that will guarantee it gets done.  Schedule a return  in 2 months, earlier p.r.n.     Clinton D. Maple Hudson, MD, Tonny Bollman, FACP  Electronically Signed    CDY/MedQ  DD: 12/18/2006  DT: 12/18/2006  Job #: [id number]   cc:   Tawnya Crook Erenest Rasher, M.D.

## 2011-05-03 NOTE — Assessment & Plan Note (Signed)
[facility]                             PULMONARY OFFICE NOTE   NAME:[name], [name]                 MRN:          [id number]  DATE:04/07/2007                            DOB:          [birth year]    HISTORY OF PRESENT ILLNESS:  The patient is a 20 year old African-  American female patient of Dr. Roxy Cedar who has a known history of  narcolepsy syndrome with severe daytime hypersomnolence, poor sleep  hygiene and morbid obesity, complicated by morbid obesity.  Patient  presents today for her 2 month routine followup and a refill of her  Ritalin prescription.  She is currently on Ritalin 20 mg three tablets  twice daily.  Patient is currently in 12th grade at [employer] and continues to have difficulty with finishing her  homework, completing tasks and continues to take quite a bit of naps  throughout the day.  Patient also confides today that she has quite a  bit of difficulties at home with support from her mother and step  parents.  She also has 3 younger siblings that she has to help care for  as well.  She finds it quite difficult to complete homework at home and  feels that if she tries to complete something it takes her quite a bit  of time so she just gives up.  Patient had previously been in  counseling; however, reports to me that her mother felt that it was not  helping and took her out of this.  Patient reports that she does not  have any friends and does not do any fun activities except for watch  t.v.  Patient denies any suicidal or homicidal ideations.  Quite a bit  of discussion today.  Today's visit was talked about patient setting  small goals for herself such as weight loss, increased activity and  attempting to complete assignments as recommended by her teachers.  I  also have suggested that she utilize her time at school so she can  complete work there instead of bringing it home where it seems to be  more  difficult for her to complete as well.  Patient, once conversation  was started with her today, she did actually engage and was quite  pleasant to talk to.  Patient does report that she does seem to have  more ability to concentrate and complete her assignments when she is  taking her medications.   PAST MEDICAL HISTORY:  Reviewed.   CURRENT MEDICATIONS:  Reviewed.   PHYSICAL EXAM:  Patient is a morbidly obese female who does have a very  flat effect and poor eye contact throughout the exam except for when she  is repeatedly engaged in a conversation and she actually does make good  contact and smiles.  VITAL SIGNS:  Stable.  The O2 saturation is 100% on room air.  Weight is  up to 292.  HEENT:  Unremarkable.  NECK:  Supple without adenopathy.  No JVD.  LUNG SOUNDS:  Clear.  CARDIAC:  S1, S2 without murmur, rub or gallop.  ABDOMEN:  Morbidly obese, soft and nontender.  EXTREMITIES:  Warm without any calf cyanosis, clubbing.  No edema is  noted.  NEURO:  Appears to be intact.  Patient is alert and oriented x3.   IMPRESSION AND PLAN:  Narcolepsy syndrome with severe daytime  hypersomnolence with poor sleep hygiene and exogenous obesity.  Patient  will go ahead and continue on Ritalin 20  mg three tablets twice daily and is encouraged on weight loss and  completion of homework assignments.  Patient will return here in 1 month  or sooner if needed.      Rubye Oaks, NP  Electronically Signed      Clinton D. Maple Hudson, MD, Tonny Bollman, FACP  Electronically Signed   TP/MedQ  DD: 04/07/2007  DT: 04/07/2007  Job #: [id number]

## 2011-05-03 NOTE — Assessment & Plan Note (Signed)
[facility]                               PULMONARY OFFICE NOTE   NAME:[name], [name] SWALLOWS                 MRN:          [id number]  DATE:08/06/2006                            DOB:          [birth year]    PULMONARY SLEEP CONSULTATION:   PROBLEM:  Sleep medicine consultation at the kind request of Dr. Erenest Rasher  and Dr. Leveda Anna for this 20 year old woman with excessive daytime somnolence.   HISTORY:  Her mother and several small children in mother's extended family  came today to describe a history of excessive daytime sleepiness since about  seventh grade.  They describe a pattern of irresistible sleepiness  repeatedly through the school day, which has drawn comments from teachers.  In the past she was treated for awhile for attention deficit-hyperactivity  disorder with Adderall.  Mother says that it helped keep her awake and more  stable, but the patient says it had no effect.  Mother describes her as a  very active small child who has gradually slowed down, become quite  sluggish, and put on a lot of weight in recent years.  Thyroid function  testing was reportedly normal.  She stays up late, especially during the  summer away from school, with no regular bedtime.  She then tends to sleep  poorly at night, waking frequently, and have difficulty staying awake  throughout the day.  She does not usually take formal naps.  Sleep onset is  described as seconds.  She reportedly snores.  Mother says that [name]  has had episodes fitting my description of sleep paralysis, but they both  deny cataplexy.  She may begin dreaming sometimes during lapses into sleep  but she does not have significant visual or auditory hallucinations and she  does not recognize that she dreams a great deal at night.  They deny  sleepwalking or other complex behaviors associated with sleep.  She has not  been using significant caffeine or any medications to help sleep.  She  had a  nocturnal polysomnogram September 26, 2005, at the [facility], which  recorded 398 minutes of sleep with a sleep efficiency of 87%.  Ten percent  of sleep time was REM.  She had an apnea/hypopnea index of 5.3 per hour,  which is barely above the upper limit of normal and likely associated with  profound daytime somnolence.  What events she had were primarily positional,  related to sleeping on her back.  She had bruxism and periodic limb movement  causing arousal 6.2 times per hour.  She subsequently returned on July 01, 2006, for a Multiple Sleep Latency Test.  Home medications were listed as  Adderall and Wellbutrin at the time of that test, but they say she has not  been taking those.  She had a mean sleep latency of 0 in that she fell  asleep within the first minute on every nap between 8:30 a.m. and 5:15 p.m.  She also went immediately into REM on all naps, indicating severe daytime  hypersomnia and increased REM pressure.   MEDICATIONS:  She is now on Zoloft but has  not noticed much impact.   SOCIAL:  Nonsmoker, limited caffeine.  She is a Chief Strategy Officer at [employer].  No children   FAMILY:  Grandfather's sister said to have narcolepsy.  Mother suspects she  has sleep apnea herself.  [name] is not pregnant.   REVIEW OF SYSTEMS:  Steady weight gain.  No headache, confusion,  incontinence, palpitation, visual or auditory hallucination.   PAST HISTORY:  No history of central nervous system problems except she was  previously treated for attention deficit-hyperactivity disorder.  No history  of seizures or sleep walking.  No cardiopulmonary disease.  Thyroid function  testing normal.   OBJECTIVE:  VITAL SIGNS:  Weight 259 pounds.  BP 108/68, pulse regular at  79, room air saturation 99%.  GENERAL:  This is a markedly overweight young woman, soft-spoken, awake,  neurologically unremarkable.  SKIN:  No rash.  ADENOPATHY:  None at the neck.   HEENT:  Nose and throat were clear.  Palate spacing was 3-4/4.  Voice  quality was normal with no stridor.  No thyromegaly or neck vein distention.  CHEST:  Quiet, clear breath sounds.  CARDIAC:  Heart sounds regular without murmur or gallop.  EXTREMITIES:  Heavy arms and legs with no restlessness or tremor.  No edema,  cyanosis or clubbing.   IMPRESSION:  1. I am concerned about history indicating lack of regular sleep schedule.      Although they deny on questioning, I suspect home environment may be      disruptive and not conducive to regular sleep habits.  2. Strongly suspect narcolepsy aggravated by poor sleep hygiene.  3. With her weight she may eventually develop obstructive sleep apnea,      although she has not demonstrated clinically significant problems in      that direction as yet.   PLAN:  1. She is given a sleep log to keep for the next 2 weeks with discussion.  2. Sleep hygiene was emphasized with particular concern that she have      regular sleep schedule and a conducive sleep environment.  Her      responsibility during future driving was emphasized.  A note was      provided for school indicating she was under treatment for a narcolepsy      disorder.  3. Generic Ritalin 20 mg SR #30, one daily, with careful discussion of      this as a controlled medication stimulant.  4. Schedule return one month, earlier p.r.n.                                   Clinton D. Maple Hudson, MD, FCCP, FACP   CDY/MedQ  DD:  08/06/2006  DT:  08/07/2006  Job #:  [id number]   cc:   [facility]  Tawnya Crook. Erenest Rasher, MD

## 2011-05-03 NOTE — Assessment & Plan Note (Signed)
[facility]                               PULMONARY OFFICE NOTE   NAME:[name], [name]                 MRN:          [id number]  DATE:09/05/2006                            DOB:          03-29-1991    1. Narcolepsy syndrome.  2. Severe daytime somnolence.  3. Poor sleep hygiene.   HISTORY:  She is by herself today.  Mother came but did not come into the  room.  She reports still being sleepy in class with no effect that she  recognized from Ritalin 20 mg SR once in the morning.  She forgot to bring  her sleep diary.  She denies syncope, chest pain or palpitation.   MEDICATIONS:  Zoloft, Ritalin 20 mg SR.   ALLERGIES:  NO MEDICATION ALLERGY.   OBJECTIVE:  Significantly overweight young woman at 250 pounds.  Blood  pressure 92/60, pulse regular 70, room air oxygen saturations 100%.  She was  quite drowsy and began to drift off on a couple of occasions, alerting to  voice but not before breath began to drag a little and gaze became slightly  disconjugate.  There was no motor abnormality.  She sat quietly.  There was  no stridor.  Lungs were clear. Heart sounds normal.   IMPRESSION:  1. Narcolepsy is probably the best diagnosis at this point recognizing      that it can be associated with significant night time sleep disruption      as the other side of the coin from day time sleepiness.  2. I remain concerned that home sleep environment may not be optimum and      this was reviewed again.  3. She did not show obstructive sleep apnea on her sleep study but with      her body build, may develop this layer.   PLAN:  1. Sleep hygiene education again done.  2. Try Ritalin SR 1 or 2 twice daily as tolerated with careful discussion      again of medication as a stimulant and controlled drug with      cardiovascular and mood issues and potentials for      diversion tolerance and dependence.  3. Schedule return one month, earlier p.r.n.                              Clinton D. Maple Hudson, MD, FCCP, FACP   CDY/MedQ  DD:  09/05/2006 DT:  09/09/2006 Job #:  [id number]   cc:   Tawnya Crook Erenest Rasher, M.D.

## 2011-05-03 NOTE — Procedures (Signed)
NAME:  [name], [name]          ACCOUNT NO.:  [id number]   MEDICAL RECORD NO.:  [id number]          PATIENT TYPE:  OUT   LOCATION:  SLEEP CENTER                 FACILITY:  [facility]   PHYSICIAN:  Clinton D. Maple Hudson, M.D. DATE OF BIRTH:  1990/12/19   DATE OF STUDY:  09/26/2005                              NOCTURNAL POLYSOMNOGRAM   REFERRING PHYSICIAN:  Dr. Billey Gosling.   DATE OF STUDY:  September 26, 2005.   INDICATION FOR STUDY:  Hypersomnia with sleep apnea. Epworth sleepiness  score 20/24.   BMI:  44.   WEIGHT:  253 pounds.   SLEEP ARCHITECTURE:  Total sleep time 398 minutes with sleep efficiency 87%.  Stage I was absent, stage II 67%, stages III and IV 23%, REM 10% of total  sleep time. Sleep latency 41 minutes, REM latency 218 minutes, awake after  sleep onset 21 minutes, arousal index 23.5. Bedtime medication was not  reported. Adult scoring criteria were used.   RESPIRATORY DATA:  Apnea/hypopnea index (AHI, RDI) 5.3 obstructive events  per hour indicating very mild obstructive sleep apnea/hypopnea syndrome.  (Normal 0/5 per hour). There were 5 central apneas, 26 obstructive apneas  and 4 hypopneas. Events were strongly associated with supine sleep position,  AHI 20.3 per hour, although she spent more time sleeping on her right side.  REM AHI 44 per hour.   OXYGEN DATA:  Moderate snoring with oxygen desaturation to a nadir of 81%.  Mean oxygen saturation through the study was 98% on room air.   CARDIAC DATA:  Normal sinus rhythm.   MOVEMENT/PARASOMNIA:  A total of 207 limb jerks were reported of which 41  were associated with arousal or awakening for periodic limb movement with  arousal index of 6.2 per hour which is increased.   IMPRESSION/RECOMMENDATIONS:  1.  Very mild obstructive sleep apnea/hypopnea syndrome, AHI 5.3 per hour      with moderate snoring and oxygen desaturation to 81%.  2.  Obstructive events were strongly associated with supine sleeping  position and REM. Treatment may include weight loss, encouraged to sleep      off flat of back and possibly use of a tricyclic antidepressant at      bedtime for mild REM suppression. She did not meet usual scoring      criteria for C-PAP at this time.  3.  Bruxism was noted throughout study.  4.  Periodic limb movement with arousal, 6.2 per hour, quite mild. If      clinically appropriate, a trial of clonazepam or Requip might be      considered.  5.  The patient described her sleep pattern at home as I usually go to      sleep and then wake up and stay awake for hours until 5 or 6 a.m. and      have to wake up at 7 a.m. This strongly suggests possibility of poor      sleep hygiene. If inadequate night time sleep is not identified, then      consider the possibility of return for a Mean Sleep Latency Test to      evaluate objectively for daytime hypersomnolence and the possibility of  narcolepsy.      Clinton D. Maple Hudson, M.D.  Diplomate, Biomedical engineer of Sleep Medicine  Electronically Signed     CDY/MEDQ  D:  09/29/2005 12:38:05  T:  09/29/2005 22:26:58  Job:  [id number]

## 2011-12-13 ENCOUNTER — Telehealth: Payer: Self-pay | Admitting: Internal Medicine

## 2011-12-13 NOTE — Telephone Encounter (Signed)
Error. No msg needed. Pt has to take disability paper downstairs to [employer] per TD. Nothing further needed at this time. [name]

## 2011-12-20 ENCOUNTER — Telehealth: Payer: Self-pay | Admitting: Internal Medicine

## 2011-12-20 NOTE — Telephone Encounter (Signed)
When patient calls please let her know that the paperwork has been completed and has been at front for her to pick up-was marked on paperwork that pt will pick up on 12-18-2011. Copy has been made for our records as well.

## 2011-12-23 NOTE — Telephone Encounter (Signed)
I spoke w/ pt mother and made her aware paperwork was placed upfront for p/u. She voiced her understanding

## 2012-01-07 ENCOUNTER — Telehealth: Payer: Self-pay | Admitting: Internal Medicine

## 2012-01-07 NOTE — Telephone Encounter (Signed)
Forms on CY's cart with this message for him to review and fill out.

## 2012-01-10 NOTE — Telephone Encounter (Signed)
I attempted to call patient to see if she is wanting to come by and pick up paperwork or have [place] fax to housing auth. Was unable to leave a message as phone stated patient was unavailable at this time. Will need to try again on Monday 01-13-2012

## 2012-01-13 NOTE — Telephone Encounter (Signed)
Patient called back stating she would come by the office and pick up paperwork-copies made and sent to HIM for scanning in [employer]. Paperwork at front for pick up.

## 2012-01-23 ENCOUNTER — Ambulatory Visit (HOSPITAL_BASED_OUTPATIENT_CLINIC_OR_DEPARTMENT_OTHER): Payer: Self-pay

## 2012-02-10 ENCOUNTER — Ambulatory Visit (HOSPITAL_BASED_OUTPATIENT_CLINIC_OR_DEPARTMENT_OTHER): Payer: Medicaid Other | Attending: Family Medicine | Admitting: General Practice

## 2012-02-10 VITALS — Ht 63.0 in | Wt 320.0 lb

## 2012-02-10 DIAGNOSIS — G4710 Hypersomnia, unspecified: Secondary | ICD-10-CM | POA: Insufficient documentation

## 2012-02-10 DIAGNOSIS — Z9989 Dependence on other enabling machines and devices: Secondary | ICD-10-CM

## 2012-02-15 DIAGNOSIS — G4730 Sleep apnea, unspecified: Secondary | ICD-10-CM

## 2012-02-15 DIAGNOSIS — G4710 Hypersomnia, unspecified: Secondary | ICD-10-CM

## 2012-02-16 NOTE — Procedures (Signed)
[name], [name]          ACCOUNT NO.:  [id number]  MEDICAL RECORD NO.:  [id number]          PATIENT TYPE:  OUT  LOCATION:  SLEEP CENTER                 FACILITY:  [facility]  PHYSICIAN:  Vilas Edgerly D. Maple Hudson, MD, FCCP, FACPDATE OF BIRTH:  1991-04-[birth year]  DATE OF STUDY:  02/10/2012                           NOCTURNAL POLYSOMNOGRAM  REFERRING PHYSICIAN:  Norberto Sorenson, MD  REFERRING PHYSICIAN:  Norberto Sorenson, MD  INDICATION FOR STUDY:  Hypersomnia with sleep apnea, history of narcolepsy.  EPWORTH SLEEPINESS SCORE:  23/24.  BMI 62.5, weight 320 pounds, height 60 inches, neck 16 inches.  MEDICATIONS:  Home medications listed as "none."  An original diagnostic NPSG on September 26, 2005, had shown mild obstructive sleep apnea, AHI 5.3 per hour.  Weight was 253 pounds.  A multiple sleep latency test on July 01, 2006, had recorded severe daytime sleepiness with a mean sleep latency of 0 minutes meaning that she fell asleep immediately.  She went into a REM on each of the 5 naps.  On Apr 24, 2010, AHI was 2.2 per hour.  For the present study, a CPAP titration was ordered.  SLEEP ARCHITECTURE:  Total sleep time 264 minutes with sleep efficiency 59%.  Stage I was 17.8%, stage II 31.4%, stage III 29.9%, REM 20.8% of total sleep time.  Sleep latency 2.5 minutes, REM latency 212.5 minutes. Awake after sleep onset 181.5 minutes.  Arousal index 20.7.  BEDTIME MEDICATION:  None.  She was mostly awake prior to 2300 p.m. and then from midnight until 1 a.m.  RESPIRATORY DATA:  CPAP titration protocol.  CPAP was titrated to 11 CWP, AHI 3.2 per hour.  She wore a small ResMed Mirage Quattro full-face mask with heated humidifier and a C-Flex setting of 3.  OXYGEN DATA:  Moderate snoring before CPAP control with oxygen desaturation to a nadir of 82%.  With CPAP control, mean oxygen saturation held 98.2% on room air and snoring was prevented.  CARDIAC DATA:  Normal sinus rhythm.  MOVEMENT-PARASOMNIA:  Rare and  insignificant limb jerk with little effect on sleep.  Bathroom x1.  IMPRESSIONS-RECOMMENDATIONS: 1. CPAP titration protocol as requested with titration to a final     pressure of 11 CWP, AHI 3.2 per hour.  She wore a small ResMed     Mirage Quattro full-face mask with heated humidifier and a C-Flex     setting of 3.  Snoring was prevented at final pressure and mean     oxygen saturation held 98.2% on room air. 2. Prior sleep studies starting on September 26 2005, AHI 5.3 per hour,     weight 253 pounds.  On July 01, 2006,     MSLT with mean sleep latency of 0 indicating very severe daytime     sleepiness, sleep onset REM in all 5 naps, consistent with     narcolepsy.  On Apr 24, 2010, AHI 2.2 per hour.     Anesia Blackwell D. Maple Hudson, MD, [facility], FACP Diplomate, American Board of Sleep Medicine    CDY/MEDQ  D:  02/15/2012 16:13:05  T:  02/16/2012 00:37:37  Job:  [id number]

## 2012-02-22 ENCOUNTER — Emergency Department (HOSPITAL_COMMUNITY)
Admission: EM | Admit: 2012-02-22 | Discharge: 2012-02-23 | Disposition: A | Discharge status: Home / Self Care | Payer: Medicaid Other | Attending: Emergency Medicine | Admitting: Emergency Medicine

## 2012-02-22 ENCOUNTER — Encounter (HOSPITAL_COMMUNITY): Payer: Self-pay

## 2012-02-22 DIAGNOSIS — K089 Disorder of teeth and supporting structures, unspecified: Secondary | ICD-10-CM | POA: Insufficient documentation

## 2012-02-22 DIAGNOSIS — J45909 Unspecified asthma, uncomplicated: Secondary | ICD-10-CM | POA: Insufficient documentation

## 2012-02-22 DIAGNOSIS — K0889 Other specified disorders of teeth and supporting structures: Secondary | ICD-10-CM

## 2012-02-22 DIAGNOSIS — K029 Dental caries, unspecified: Secondary | ICD-10-CM | POA: Insufficient documentation

## 2012-02-22 MED ORDER — HYDROCODONE-ACETAMINOPHEN 5-325 MG PO TABS
2.0000 | ORAL_TABLET | Freq: Once | ORAL | Status: AC
  Administered 2012-02-22: 2 via ORAL
  Filled 2012-02-22: qty 2

## 2012-02-22 NOTE — ED Provider Notes (Signed)
History     CSN: [id number]  Arrival date & time 02/22/12  4098   First MD Initiated Contact with Patient 02/22/12 2311      No chief complaint on file.   (Consider location/radiation/quality/duration/timing/severity/associated sxs/prior treatment) Patient is a 21 y.o. female presenting with tooth pain. The history is provided by the patient. No language interpreter was used.  Dental Pain Additional symptoms include: gum swelling, gum tenderness, jaw pain and fatigue. Additional symptoms do not include: purulent gums, trismus, facial swelling, trouble swallowing, pain with swallowing, excessive salivation, taste disturbance, smell disturbance, drooling, ear pain, hearing loss, swollen glands and goiter. Medical issues do not include: alcohol problem, smoking, periodontal disease and cancer.    Past Medical History  Diagnosis Date  . Asthma   . Exogenous obesity   . Poor sleep hygiene   . 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 Narcolepsy cataplexy syndrome     Past Surgical History  Procedure Date  . None     Family History  Problem Relation Age of Onset  . Depression      History  Substance Use Topics  . Smoking status: Not on file  . Smokeless tobacco: Not on file  . Alcohol Use: Not on file    OB History    Grav Para Term Preterm Abortions TAB SAB Ect Mult Living                  Review of Systems  Constitutional: Positive for fatigue.  HENT: Negative for hearing loss, ear pain, facial swelling, drooling and trouble swallowing.   All other systems reviewed and are negative.    Allergies  Review of patient's allergies indicates not on file.  Home Medications   Current Outpatient Rx  Name Route Sig Dispense Refill  . ARMODAFINIL 250 MG PO TABS Oral Take 250 mg by mouth every morning.     . ARMODAFINIL 250 MG PO TABS Oral Take 250 mg by mouth daily.      Marland Kitchen  CETIRIZINE HCL 10 MG PO TABS Oral Take 10 mg by mouth daily.      Marland Kitchen FERROUS SULFATE 325 (65 FE) MG PO TBEC Oral Take 325 mg by mouth 3 (three) times daily with meals.      Marland Kitchen NAPROXEN 500 MG PO TBEC Oral Take 500 mg by mouth 2 (two) times daily with meals.      Suzzanne Cloud ESTRADIOL 0.25-35 MG-MCG PO TABS Oral Take 1 tablet by mouth daily. 3 tabs by mouth daily for 1 week on the first 7 days of the month for the next 3 month disp 1 pack      . OMEPRAZOLE MAGNESIUM 20 MG PO TBEC Oral Take 20 mg by mouth daily.     Marland Kitchen TEMAZEPAM 30 MG PO CAPS Oral Take 30 mg by mouth at bedtime as needed.        There were no vitals taken for this visit.  Physical Exam  Nursing note and vitals reviewed. Constitutional: She is oriented to person, place, and time. She appears well-developed and well-nourished.  HENT:  Head: Normocephalic and atraumatic. No trismus in the jaw.  Right Ear: Tympanic membrane normal. No drainage or tenderness.  Left Ear: Tympanic membrane normal. There is tenderness. No drainage.  Nose: Nose normal.  Mouth/Throat: Uvula is midline and mucous membranes are normal. She does not have dentures. No oral lesions.  Abnormal dentition. Dental caries present. No uvula swelling. No oropharyngeal exudate, posterior oropharyngeal edema, posterior oropharyngeal erythema or tonsillar abscesses.    Eyes: Conjunctivae and EOM are normal. Pupils are equal, round, and reactive to light.  Neck: Normal range of motion. Neck supple.  Cardiovascular: Normal rate, regular rhythm, normal heart sounds and intact distal pulses.  Exam reveals no gallop and no friction rub.   No murmur heard. Pulmonary/Chest: Effort normal and breath sounds normal.  Abdominal: Soft. Bowel sounds are normal.  Musculoskeletal: Normal range of motion. She exhibits no edema and no tenderness.  Neurological: She is alert and oriented to person, place, and time. She has normal reflexes.  Skin: Skin is warm and dry.    Psychiatric: She has a normal mood and affect.    ED Course  Procedures (including critical care time)  Labs Reviewed - No data to display No results found.   No diagnosis found.    MDM   20yo morbidly obese female with R upper toothache intermittant x 1 month with some gum swelling.  Treated with narcotic pain meds and penicillin.  Dental referral to call Monday am.         Jethro Bastos, NP 02/23/12 0025  Medical screening examination/treatment/procedure(s) were performed by non-physician practitioner and as supervising physician I was immediately available for consultation/collaboration.  Sunnie Nielsen, MD 02/23/12 [phone]

## 2012-02-22 NOTE — ED Notes (Signed)
Pt report dental pain x 1 month increased pain this week- rt sided face upper jaw pain

## 2012-02-23 MED ORDER — HYDROCODONE-ACETAMINOPHEN 7.5-750 MG PO TABS: 1.0000 | ORAL_TABLET | Freq: Four times a day (QID) | ORAL | Status: AC | PRN

## 2012-02-23 MED ORDER — PENICILLIN V POTASSIUM 500 MG PO TABS: 500.0000 mg | ORAL_TABLET | Freq: Three times a day (TID) | ORAL | Status: AC

## 2012-02-23 NOTE — Discharge Instructions (Signed)
Ms Horrell we gave you narcotic pain meds in the ER tonight.  Do not drive.  Call Dr. Melynda Ripple first thing Monday am and tell him you need to be seen .  Tell them you were in the ER.  Start the pcn as soon as possible. Return for severe pain or high fever.Dental Pain Toothache is pain in or around a tooth. It may get worse with chewing or with cold or heat.  HOME CARE  Your dentist may use a numbing medicine during treatment. If so, you may need to avoid eating until the medicine wears off. Ask your dentist about this.   Only take medicine as told by your dentist or doctor.   Avoid chewing food near the painful tooth until after all treatment is done. Ask your dentist about this.  GET HELP RIGHT AWAY IF:   The problem gets worse or new problems appear.   You have a fever.   There is redness and puffiness (swelling) of the face, jaw, or neck.   You cannot open your mouth.   There is pain in the jaw.   There is very bad pain that is not helped by medicine.  MAKE SURE YOU:   Understand these instructions.   Will watch your condition.   Will get help right away if you are not doing well or get worse.  Document Released: 05/20/2008 Document Revised: 11/21/2011 Document Reviewed: 05/20/2008 [facility] Patient Information 2012 [place], [place].

## 2012-03-02 ENCOUNTER — Ambulatory Visit (HOSPITAL_BASED_OUTPATIENT_CLINIC_OR_DEPARTMENT_OTHER): Payer: Medicaid Other | Attending: Family Medicine | Admitting: Radiology

## 2012-03-02 DIAGNOSIS — G4730 Sleep apnea, unspecified: Secondary | ICD-10-CM | POA: Insufficient documentation

## 2012-03-02 DIAGNOSIS — G4710 Hypersomnia, unspecified: Secondary | ICD-10-CM | POA: Insufficient documentation

## 2012-03-07 DIAGNOSIS — G4730 Sleep apnea, unspecified: Secondary | ICD-10-CM

## 2012-03-07 DIAGNOSIS — G4710 Hypersomnia, unspecified: Secondary | ICD-10-CM

## 2012-03-07 NOTE — Procedures (Signed)
NAMEJAYLANI, [name]          ACCOUNT NO.:  [id number]  MEDICAL RECORD NO.:  [id number]          PATIENT TYPE:  OUT  LOCATION:  SLEEP CENTER                 FACILITY:  [facility]  PHYSICIAN:  [name] D. Maple Hudson, MD, FCCP, FACPDATE OF BIRTH:  November 07, 1991  DATE OF STUDY:  03/02/2012                         MULTIPLE SLEEP LATENCY TEST  REFERRING PHYSICIAN:  Norberto Sorenson, MD  INDICATION FOR STUDY:  Severe daytime hypersomnia, sleep apnea controlled with CPAP.  EPWORTH SLEEPINESS SCORE:  24/24.  BMI: 1. Weight 339 pounds.  Height 5 feet 3 inches, neck 16 inches.  MEDICATIONS:  Charted "no medications taken throughout the day."  She had slept the night before wearing CPAP at 11 CWP with C-Flex setting of 3.  CPAP was 1 throughout the study.  NAP 1:  8:15 a.m., sleep latency 0.  REM latency 0.  NAP 2:  10:15 a.m., sleep latency 0.  REM latency 0.  NAP 3:  12:15 p.m., sleep latency 0.  REM latency 0.  NAP 4:  2:16 p.m., sleep latency 0.  REM latency 0.  NAP 5:  16:15 p.m., sleep latency 0.  REM latency 0.  MEAN SLEEP LATENCY:  0 minutes.  TOTAL NUMBER OF NAPS:  5.  NUMBER OF NAPS WITH SLEEP:  5.  NUMBER OF NAPS WITH REM:  5.  COMMENTS:  Severe daytime hypersomnia with high REM pressure.  She was asleep immediately and in REM immediately with each nap opportunity throughout the day.  Mean sleep latency 0, sleep onset REM 5/5.  This is pathologic daytime hypersomnia consistent with narcolepsy.  IMPRESSIONS-RECOMMENDATIONS: 1. Pathologic daytime hypersomnia consistent with narcolepsy.  The     patient uses CPAP for sleep all night every night and wore CPAP 11     CWP with a medium ResMed Quattro full-face mask, and C flex setting     of 3, for each nap in the current study. 2. Previous sleep studies:  NPSG September 26, 2005, recorded AHI 5.3     per hour. Mean Sleep Latency Test on July 01, 2006, recorded sleep     latency 0 minutes with REM on all 5 naps.  Diagnostic NPSG on Apr 24, 2010, recorded AHI 2.2 per hour.  Significant obstructive sleep     apnea is excluded.  The patient is wearing CPAP.  Sleep-disordered     breathing is not considered a likely explanation for this profound     degree of daytime hypersomnia.     [name] D. Maple Hudson, MD, [facility], FACP Diplomate, American Board of Sleep Medicine    CDY/MEDQ  D:  03/07/2012 10:15:50  T:  03/07/2012 10:52:16  Job:  [id number]

## 2012-04-22 ENCOUNTER — Emergency Department (HOSPITAL_COMMUNITY)
Admission: EM | Admit: 2012-04-22 | Discharge: 2012-04-22 | Disposition: A | Discharge status: Home / Self Care | Payer: Medicaid Other | Attending: Emergency Medicine | Admitting: Emergency Medicine

## 2012-04-22 DIAGNOSIS — G47411 Narcolepsy with cataplexy: Secondary | ICD-10-CM | POA: Insufficient documentation

## 2012-04-22 DIAGNOSIS — J45909 Unspecified asthma, uncomplicated: Secondary | ICD-10-CM | POA: Insufficient documentation

## 2012-04-22 DIAGNOSIS — E669 Obesity, unspecified: Secondary | ICD-10-CM | POA: Insufficient documentation

## 2012-04-22 DIAGNOSIS — H6690 Otitis media, unspecified, unspecified ear: Secondary | ICD-10-CM | POA: Insufficient documentation

## 2012-04-22 MED ORDER — IBUPROFEN 200 MG PO TABS
ORAL_TABLET | ORAL | Status: AC
  Administered 2012-04-22: 600 mg
  Filled 2012-04-22: qty 3

## 2012-04-22 MED ORDER — IBUPROFEN 600 MG PO TABS: 600.0000 mg | ORAL_TABLET | Freq: Four times a day (QID) | ORAL | Status: AC | PRN

## 2012-04-22 MED ORDER — AMOXICILLIN 500 MG PO CAPS: 500.0000 mg | ORAL_CAPSULE | Freq: Three times a day (TID) | ORAL | Status: AC

## 2012-04-22 MED ORDER — ANTIPYRINE-BENZOCAINE 5.4-1.4 % OT SOLN
3.0000 [drp] | Freq: Once | OTIC | Status: AC
  Administered 2012-04-22: 4 [drp] via OTIC
  Filled 2012-04-22: qty 10

## 2012-04-22 NOTE — ED Provider Notes (Signed)
Medical screening examination/treatment/procedure(s) were performed by non-physician practitioner and as supervising physician I was immediately available for consultation/collaboration.  Flint Melter, MD 04/22/12 2124

## 2012-04-22 NOTE — Discharge Instructions (Signed)
Take ibuprofen for pain. [place]agan 2-4 drops every 3-4 hrs for pain. Amoxillin as prescribed until all gone. Follow up with your doctor.   Otitis Media, Adult A middle ear infection is an infection in the space behind the eardrum. The medical name for this is "otitis media." It may happen after a common cold. It is caused by a germ that starts growing in that space. You may feel swollen glands in your neck on the side of the ear infection. HOME CARE INSTRUCTIONS   Take your medicine as directed until it is gone, even if you feel better after the first few days.   Only take over-the-counter or prescription medicines for pain, discomfort, or fever as directed by your caregiver.   Occasional use of a nasal decongestant a couple times per day may help with discomfort and help the eustachian tube to drain better.  Follow up with your caregiver in 10 to 14 days or as directed, to be certain that the infection has cleared. Not keeping the appointment could result in a chronic or permanent injury, pain, hearing loss and disability. If there is any problem keeping the appointment, you must call back to this facility for assistance. SEEK IMMEDIATE MEDICAL CARE IF:   You are not getting better in 2 to 3 days.   You have pain that is not controlled with medication.   You feel worse instead of better.   You cannot use the medication as directed.   You develop swelling, redness or pain around the ear or stiffness in your neck.  MAKE SURE YOU:   Understand these instructions.   Will watch your condition.   Will get help right away if you are not doing well or get worse.  Document Released: 09/06/2004 Document Revised: 11/21/2011 Document Reviewed: 07/08/2008 [facility] Patient Information 2012 [place], [place].

## 2012-04-22 NOTE — ED Provider Notes (Signed)
History     CSN: [id number]  Arrival date & time 04/22/12  1355   First MD Initiated Contact with Patient 04/22/12 1416      Chief Complaint  Patient presents with  . Dental Pain    (Consider location/radiation/quality/duration/timing/severity/associated sxs/prior treatment) Patient is a 21 y.o. female presenting with ear pain. The history is limited by a developmental delay.  Otalgia This is a new problem. The current episode started more than 1 week ago. There is pain in the right ear. The problem occurs constantly. The problem has not changed since onset.There has been no fever. The pain is at a severity of 4/10. Pertinent negatives include no ear discharge, no sore throat and no neck pain.  Pt with right ear ache and right jaw pain for "months." states thought it was a tooth related, had one pulled out few days ago, but pain continues. Denies fever, chills, facial swelling.   Past Medical History  Diagnosis Date  . Asthma   . Exogenous obesity   . Poor sleep hygiene   . 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 Narcolepsy cataplexy syndrome     Past Surgical History  Procedure Date  . None     Family History  Problem Relation Age of Onset  . Depression      History  Substance Use Topics  . Smoking status: Never Smoker   . Smokeless tobacco: Not on file  . Alcohol Use: No    OB History    Grav Para Term Preterm Abortions TAB SAB Ect Mult Living                  Review of Systems  Constitutional: Negative for fever and chills.  HENT: Positive for ear pain and dental problem. Negative for congestion, sore throat, neck pain and ear discharge.   Respiratory: Negative.   Cardiovascular: Negative.   Genitourinary: Negative.   Musculoskeletal: Negative.   Skin: Negative.     Allergies  Review of patient's allergies indicates no known allergies.  Home Medications    Current Outpatient Rx  Name Route Sig Dispense Refill  . ARMODAFINIL 250 MG PO TABS Oral Take 250 mg by mouth daily.    Marland Kitchen CETIRIZINE HCL 10 MG PO TABS Oral Take 10 mg by mouth daily as needed. For allergies.    Di Kindle SULFATE 325 (65 FE) MG PO TBEC Oral Take 325 mg by mouth 3 (three) times daily with meals.      Marland Kitchen NAPROXEN 500 MG PO TBEC Oral Take 500 mg by mouth 2 (two) times daily with meals.      . OMEPRAZOLE MAGNESIUM 20 MG PO TBEC Oral Take 20 mg by mouth daily.       BP 128/79  Pulse 85  Temp(Src) 98.6 F (37 C) (Oral)  Resp 20  SpO2 99%  LMP 03/15/2012  Physical Exam  Nursing note and vitals reviewed. Constitutional: She appears well-developed and well-nourished. No distress.  HENT:  Head: Normocephalic.  Right Ear: External ear and ear canal normal. Tympanic membrane is bulging. A middle ear effusion is present.  Left Ear: Tympanic membrane, external ear and ear canal normal.  Nose: Nose normal.  Mouth/Throat: Uvula is midline, oropharynx is clear and moist and mucous membranes are normal.       Normal dentition  Eyes: Conjunctivae are normal.  Neck: Neck supple.  Cardiovascular: Normal rate  and regular rhythm.   Pulmonary/Chest: Effort normal and breath sounds normal. No respiratory distress. She has no wheezes.  Lymphadenopathy:    She has no cervical adenopathy.  Neurological: She is alert.  Skin: Skin is warm and dry.  Psychiatric: She has a normal mood and affect.    ED Course  Procedures (including critical care time)  Right otitis media on exam. Auralgan given for pain. Will start on amoxicillin follow up with pcp.  1. Otitis media       MDM          Lottie Mussel, PA 04/22/12 1608

## 2012-04-22 NOTE — ED Notes (Signed)
Reviewed instructions and Rx for Advil and Abx

## 2012-04-22 NOTE — ED Notes (Signed)
Pt c/o dental pain/ right ear pain x "months". Was here previously for same.

## 2012-05-23 ENCOUNTER — Emergency Department (HOSPITAL_COMMUNITY)
Admission: EM | Admit: 2012-05-23 | Discharge: 2012-05-23 | Disposition: A | Discharge status: Home / Self Care | Payer: Medicaid Other | Attending: Emergency Medicine | Admitting: Emergency Medicine

## 2012-05-23 ENCOUNTER — Encounter (HOSPITAL_COMMUNITY): Payer: Self-pay | Admitting: Emergency Medicine

## 2012-05-23 DIAGNOSIS — K089 Disorder of teeth and supporting structures, unspecified: Secondary | ICD-10-CM | POA: Insufficient documentation

## 2012-05-23 DIAGNOSIS — H9209 Otalgia, unspecified ear: Secondary | ICD-10-CM | POA: Insufficient documentation

## 2012-05-23 DIAGNOSIS — K0889 Other specified disorders of teeth and supporting structures: Secondary | ICD-10-CM

## 2012-05-23 MED ORDER — HYDROCODONE-ACETAMINOPHEN 5-325 MG PO TABS: 1.0000 | ORAL_TABLET | ORAL | Status: AC | PRN

## 2012-05-23 NOTE — Discharge Instructions (Signed)
Toothache Toothaches are usually caused by tooth decay (cavity). However, other causes of toothache include:  Gum disease.   Cracked tooth.   Cracked filling.   Injury.   Jaw problem (temporo mandibular joint or TMJ disorder).   Tooth abscess.   Root sensitivity.   Grinding.   Eruption problems.  Swelling and redness around a painful tooth often means you have a dental abscess. Pain medicine and antibiotics can help reduce symptoms, but you will need to see a dentist within the next few days to have your problem properly evaluated and treated. If tooth decay is the problem, you may need a filling or root canal to save your tooth. If the problem is more severe, your tooth may need to be pulled. SEEK IMMEDIATE MEDICAL CARE IF:  You cannot swallow.   You develop severe swelling, increased redness, or increased pain in your mouth or face.   You have a fever.   You cannot open your mouth adequately.  Document Released: 01/09/2005 Document Revised: 11/21/2011 Document Reviewed: 03/01/2010 Exit[employer] Patient Information 2012 [employer]. 

## 2012-05-23 NOTE — ED Notes (Signed)
Pt. Stated, toothach for over 3 months, last round of antibiotics one month

## 2012-05-23 NOTE — ED Provider Notes (Signed)
History   This chart was scribed for [name] Melter, MD by Shari Heritage. The patient was seen in room STRE2/STRE2. Patient's care was started at 1345.     CSN: [id number]  Arrival date & time 05/23/12  1345   None     Chief Complaint  Patient presents with  . Dental Pain    (Consider location/radiation/quality/duration/timing/severity/associated sxs/prior treatment) The history is provided by the patient. No language interpreter was used.   [name] is a 21 y.o. female who presents to the Emergency Department complaining of dental pain on the bottom row onset 3 months ago. Patient had a tooth pulled in March and took last round of antibiotics one month ago. Patient is also complaining of bilateral discomfort in ears. 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 Patient has never smoked.  Past Medical History  Diagnosis Date  . Asthma   . Exogenous obesity   . Poor sleep hygiene   . 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 Narcolepsy cataplexy syndrome     Past Surgical History  Procedure Date  . None     Family History  Problem Relation Age of Onset  . Depression      History  Substance Use Topics  . Smoking status: Never Smoker   . Smokeless tobacco: Not on file  . Alcohol Use: No    OB History    Grav Para Term Preterm Abortions TAB SAB Ect Mult Living                  Review of Systems A complete 10 system review of systems was obtained and all systems are negative except as noted in the HPI and PMH.   Allergies  Review of patient's allergies indicates no known allergies.  Home Medications   Current Outpatient Rx  Name Route Sig Dispense Refill  . METHYLPHENIDATE HCL 10 MG PO TABS Oral Take 10 mg by mouth 2 (two) times daily.    Marland Kitchen HYDROCODONE-ACETAMINOPHEN 5-325 MG PO TABS Oral Take 1 tablet by mouth  every 4 (four) hours as needed for pain. 10 tablet 0    BP 125/82  Pulse 76  Temp(Src) 98.2 F (36.8 C) (Oral)  Resp 18  SpO2 97%  LMP 04/15/2012  Physical Exam  Nursing note and vitals reviewed. Constitutional: She is oriented to person, place, and time. She appears well-developed and well-nourished. No distress.       Morbidly obese.  HENT:  Head: Normocephalic and atraumatic. No trismus in the jaw.  Mouth/Throat: Normal dentition.       Bilateral cerumen impactions.  Eyes: Conjunctivae and EOM are normal.  Neck: Neck supple. No tracheal deviation present. No mass present.       No deformity.  Cardiovascular: Normal rate.   Pulmonary/Chest: Effort normal. No respiratory distress.  Abdominal: She exhibits no distension.  Musculoskeletal: Normal range of motion.  Lymphadenopathy:    She has no cervical adenopathy.  Neurological: She is alert and oriented to person, place, and time. No sensory deficit.  Skin: Skin is dry.  Psychiatric: She has a normal mood and affect. Her behavior is normal.    ED Course  Procedures (including critical care time) DIAGNOSTIC STUDIES: Oxygen Saturation is 97% on room air, adequate by my interpretation.    COORDINATION OF CARE: 2:28PM - Patient informed of current plan for treatment  and evaluation and agrees with plan at this time. Will provide pain relievers. Recommended that patient see dentist as soon as possible.   Labs Reviewed - No data to display No results found.   1. Pain, dental       MDM  Nonspecific dental pain, without evidence for fracture or, abscess or sinusitis. Doubt metabolic instability, serious bacterial infection or impending vascular collapse; the patient is stable for discharge.      I personally performed the services described in this documentation, which was scribed in my presence. The recorded information has been reviewed and considered.    Plan: Home Medications- Norco; Home Treatments- usual;  Recommended follow up- Dentist on Monday   [name] Melter, MD 05/23/12 2015

## 2012-06-15 ENCOUNTER — Emergency Department (INDEPENDENT_AMBULATORY_CARE_PROVIDER_SITE_OTHER)
Admission: EM | Admit: 2012-06-15 | Discharge: 2012-06-15 | Disposition: A | Discharge status: Home / Self Care | Payer: Medicaid Other | Source: Home / Self Care | Attending: Family Medicine | Admitting: Family Medicine

## 2012-06-15 DIAGNOSIS — J209 Acute bronchitis, unspecified: Secondary | ICD-10-CM

## 2012-06-15 MED ORDER — AZITHROMYCIN 250 MG PO TABS: ORAL_TABLET | ORAL | Status: AC

## 2012-06-15 MED ORDER — MONTELUKAST SODIUM 10 MG PO TABS: 10.0000 mg | ORAL_TABLET | Freq: Every day | ORAL | Status: DC

## 2012-06-15 NOTE — Discharge Instructions (Signed)
Take all of medicine, drink lots of fluids,, see your doctor if further problems °

## 2012-06-15 NOTE — ED Notes (Signed)
HERE WITH COUGH AND CLEAR SPUTUM,CHEST CONGESTION AND SORE THROAT THAT STARTED X 3 DYS AGO UNRELIEVED BY OTC MEDS

## 2012-06-15 NOTE — ED Provider Notes (Signed)
History     CSN: [id number]  Arrival date & time 06/15/12  1423   First MD Initiated Contact with Patient 06/15/12 1508      Chief Complaint  Patient presents with  . Cough    (Consider location/radiation/quality/duration/timing/severity/associated sxs/prior treatment) Patient is a 21 y.o. female presenting with cough. The history is provided by the patient.  Cough This is a new problem. The current episode started more than 2 days ago. The problem has been gradually worsening. The cough is productive of sputum. There has been no fever. Associated symptoms include rhinorrhea and sore throat. Pertinent negatives include no chest pain, no chills, no ear congestion and no wheezing. She is not a smoker. Her past medical history is significant for bronchitis.    Past Medical History  Diagnosis Date  . Asthma   . Exogenous obesity   . Poor sleep hygiene   . 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 Narcolepsy cataplexy syndrome     Past Surgical History  Procedure Date  . None     Family History  Problem Relation Age of Onset  . Depression      History  Substance Use Topics  . Smoking status: Never Smoker   . Smokeless tobacco: Not on file  . Alcohol Use: No    OB History    Grav Para Term Preterm Abortions TAB SAB Ect Mult Living                  Review of Systems  Constitutional: Negative for chills.  HENT: Positive for sore throat and rhinorrhea. Negative for congestion and postnasal drip.   Respiratory: Positive for cough. Negative for wheezing.   Cardiovascular: Negative for chest pain.  Gastrointestinal: Negative.   Skin: Negative.     Allergies  Review of patient's allergies indicates no known allergies.  Home Medications   Current Outpatient Rx  Name Route Sig Dispense Refill  . AZITHROMYCIN 250 MG PO TABS  Take as directed on pack 6 each 0  .  METHYLPHENIDATE HCL 10 MG PO TABS Oral Take 10 mg by mouth 2 (two) times daily.    Marland Kitchen MONTELUKAST SODIUM 10 MG PO TABS Oral Take 1 tablet (10 mg total) by mouth at bedtime. 30 tablet 1    BP 117/77  Pulse 98  Temp 98.8 F (37.1 C) (Oral)  Resp 20  SpO2 98%  LMP 04/15/2012  Physical Exam  Nursing note and vitals reviewed. Constitutional: She is oriented to person, place, and time. She appears well-developed and well-nourished.  HENT:  Head: Normocephalic.  Right Ear: External ear normal.  Left Ear: External ear normal.  Nose: Nose normal.  Mouth/Throat: Oropharynx is clear and moist.  Eyes: Pupils are equal, round, and reactive to light.  Neck: Normal range of motion. Neck supple.  Cardiovascular: Regular rhythm, normal heart sounds and intact distal pulses.   Pulmonary/Chest: Effort normal and breath sounds normal.  Lymphadenopathy:    She has no cervical adenopathy.  Neurological: She is alert and oriented to person, place, and time.  Skin: Skin is warm and dry.    ED Course  Procedures (including critical care time)  Labs Reviewed - No data to display No results found.   1. Bronchitis, acute       MDM          Linna Hoff, MD 06/15/12 [phone]

## 2013-10-16 ENCOUNTER — Emergency Department (HOSPITAL_COMMUNITY)
Admission: EM | Admit: 2013-10-16 | Discharge: 2013-10-16 | Disposition: A | Discharge status: Home / Self Care | Payer: Medicaid Other | Attending: Emergency Medicine | Admitting: Emergency Medicine

## 2013-10-16 ENCOUNTER — Encounter (HOSPITAL_COMMUNITY): Payer: Self-pay | Admitting: Emergency Medicine

## 2013-10-16 DIAGNOSIS — E669 Obesity, unspecified: Secondary | ICD-10-CM | POA: Insufficient documentation

## 2013-10-16 DIAGNOSIS — Z8669 Personal history of other diseases of the nervous system and sense organs: Secondary | ICD-10-CM | POA: Insufficient documentation

## 2013-10-16 DIAGNOSIS — M79609 Pain in unspecified limb: Secondary | ICD-10-CM | POA: Insufficient documentation

## 2013-10-16 DIAGNOSIS — Z79899 Other long term (current) drug therapy: Secondary | ICD-10-CM | POA: Insufficient documentation

## 2013-10-16 DIAGNOSIS — J45909 Unspecified asthma, uncomplicated: Secondary | ICD-10-CM | POA: Insufficient documentation

## 2013-10-16 DIAGNOSIS — M5432 Sciatica, left side: Secondary | ICD-10-CM

## 2013-10-16 DIAGNOSIS — M5430 Sciatica, unspecified side: Secondary | ICD-10-CM | POA: Insufficient documentation

## 2013-10-16 DIAGNOSIS — R209 Unspecified disturbances of skin sensation: Secondary | ICD-10-CM | POA: Insufficient documentation

## 2013-10-16 MED ORDER — IBUPROFEN 800 MG PO TABS: 800.0000 mg | ORAL_TABLET | Freq: Three times a day (TID) | ORAL | Status: DC

## 2013-10-16 NOTE — ED Provider Notes (Signed)
Medical screening examination/treatment/procedure(s) were performed by non-physician practitioner and as supervising physician I was immediately available for consultation/collaboration.  EKG Interpretation   None         Audree Camel, MD 10/16/13 [phone]

## 2013-10-16 NOTE — ED Notes (Signed)
C/o left pinky toe pain and numbness that started a few days ago. Stated that if she stands up for a long period of time this toe beside her pinky toe becomes numb also. Denies any injury to toe.

## 2013-10-16 NOTE — ED Provider Notes (Signed)
CSN: [id number]     Arrival date & time 10/16/13  0714 History   First MD Initiated Contact with Patient 10/16/13 0715     Chief Complaint  Patient presents with  . Toe Pain   (Consider location/radiation/quality/duration/timing/severity/associated sxs/prior Treatment) HPI Comments: Patient presents emergency department with chief complaint of numbness and tingling to her left leg and left pinky toe. Patient states that she recently strained her back while doing laundry. He states that she has had radiating symptoms from the back to the left leg for the past 3-4 days. She states that she is tried taking Aleve with some relief. She denies bowel or bladder incontinence. Denies saddle anesthesia. Denies difficulty walking. Her symptoms are mild but persistent.  The history is provided by the patient. No language interpreter was used.    Past Medical History  Diagnosis Date  . Asthma   . Exogenous obesity   . Poor sleep hygiene   . 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 Narcolepsy cataplexy syndrome    Past Surgical History  Procedure Laterality Date  . None     Family History  Problem Relation Age of Onset  . Depression     History  Substance Use Topics  . Smoking status: Never Smoker   . Smokeless tobacco: Not on file  . Alcohol Use: No   OB History   Grav Para Term Preterm Abortions TAB SAB Ect Mult Living                 Review of Systems  All other systems reviewed and are negative.    Allergies  Review of patient's allergies indicates no known allergies.  Home Medications   Current Outpatient Rx  Name  Route  Sig  Dispense  Refill  . methylphenidate (RITALIN) 10 MG tablet   Oral   Take 10 mg by mouth 2 (two) times daily.         Marland Kitchen EXPIRED: montelukast (SINGULAIR) 10 MG tablet   Oral   Take 1 tablet (10 mg total) by mouth at bedtime.   30 tablet   1    BP  129/74  Pulse 92  Temp(Src) 98 F (36.7 C) (Oral)  SpO2 99%  LMP 09/12/2013 Physical Exam  Nursing note and vitals reviewed. Constitutional: She is oriented to person, place, and time. She appears well-developed and well-nourished. No distress.  HENT:  Head: Normocephalic and atraumatic.  Eyes: Conjunctivae and EOM are normal. Right eye exhibits no discharge. Left eye exhibits no discharge. No scleral icterus.  Neck: Normal range of motion. Neck supple. No tracheal deviation present.  Cardiovascular: Normal rate, regular rhythm, normal heart sounds and intact distal pulses.  Exam reveals no gallop and no friction rub.   No murmur heard. Intact distal pulses with brisk capillary refill  Pulmonary/Chest: Effort normal and breath sounds normal. No respiratory distress. She has no wheezes.  Abdominal: Soft. She exhibits no distension. There is no tenderness.  Musculoskeletal: Normal range of motion.  Left-sided lumbar paraspinal muscles tender to palpation, no bony tenderness, step-offs, or gross abnormality or deformity of spine, patient is able to ambulate, moves all extremities  Bilateral great toe extension intact Bilateral plantar/dorsiflexion intact  Neurological: She is alert and oriented to person, place, and time. She has normal reflexes.  Sensation and strength intact bilaterally Sharp/dull sensation intact Symmetrical reflexes  Skin: Skin is warm. She is  not diaphoretic.  Psychiatric: She has a normal mood and affect. Her behavior is normal. Judgment and thought content normal.    ED Course  Procedures (including critical care time) Labs Review Labs Reviewed - No data to display Imaging Review No results found.  EKG Interpretation   None       MDM   1. Sciatica, left    Patient with back pain.  No neurological deficits and normal neuro exam.  Ambulates appropriately. No loss of bowel or bladder control.  No concern for cauda equina.  No fever, night sweats,  weight loss, h/o cancer, IVDU.  RICE protocol and pain medicine indicated and discussed with patient. Followup with orthopedics. Patient is stable and ready for discharge.    Roxy Horseman, PA-C 10/16/13 [phone]

## 2014-08-11 ENCOUNTER — Emergency Department (HOSPITAL_COMMUNITY)
Admission: EM | Admit: 2014-08-11 | Discharge: 2014-08-11 | Disposition: A | Discharge status: Home / Self Care | Payer: Medicaid Other | Attending: Emergency Medicine | Admitting: Emergency Medicine

## 2014-08-11 ENCOUNTER — Encounter (HOSPITAL_COMMUNITY): Payer: Self-pay | Admitting: Emergency Medicine

## 2014-08-11 DIAGNOSIS — M26629 Arthralgia of temporomandibular joint, unspecified side: Secondary | ICD-10-CM

## 2014-08-11 DIAGNOSIS — H9209 Otalgia, unspecified ear: Secondary | ICD-10-CM | POA: Insufficient documentation

## 2014-08-11 DIAGNOSIS — Z8719 Personal history of other diseases of the digestive system: Secondary | ICD-10-CM | POA: Insufficient documentation

## 2014-08-11 DIAGNOSIS — M2669 Other specified disorders of temporomandibular joint: Secondary | ICD-10-CM | POA: Insufficient documentation

## 2014-08-11 DIAGNOSIS — Z791 Long term (current) use of non-steroidal anti-inflammatories (NSAID): Secondary | ICD-10-CM | POA: Insufficient documentation

## 2014-08-11 DIAGNOSIS — Z8669 Personal history of other diseases of the nervous system and sense organs: Secondary | ICD-10-CM | POA: Insufficient documentation

## 2014-08-11 DIAGNOSIS — Z8659 Personal history of other mental and behavioral disorders: Secondary | ICD-10-CM | POA: Insufficient documentation

## 2014-08-11 DIAGNOSIS — Z792 Long term (current) use of antibiotics: Secondary | ICD-10-CM | POA: Insufficient documentation

## 2014-08-11 DIAGNOSIS — K089 Disorder of teeth and supporting structures, unspecified: Secondary | ICD-10-CM | POA: Insufficient documentation

## 2014-08-11 DIAGNOSIS — K047 Periapical abscess without sinus: Secondary | ICD-10-CM | POA: Insufficient documentation

## 2014-08-11 MED ORDER — AMOXICILLIN 500 MG PO CAPS: 500.0000 mg | ORAL_CAPSULE | Freq: Three times a day (TID) | ORAL | Status: DC

## 2014-08-11 MED ORDER — MELOXICAM 15 MG PO TABS: 15.0000 mg | ORAL_TABLET | Freq: Every day | ORAL | Status: DC

## 2014-08-11 NOTE — Discharge Instructions (Signed)
Look up the [facility]'s [facility] for free dental clinics. Http://www.ncdental.org/ncds/Schedule.asp ° °Get there early and be prepared to wait. [employer] and [employer] have dental hygienist schools that provide low cost routine dental care.  ° °Other resources: °[facility] °[address] °[place], [place] °[phone] ° °Patients with Medicaid: °[facility]                     [facility] °[address]                                [address] °Phone:  [phone]                                                  Phone:  [phone] ° °Dr. Janice Civils °[address] °[phone] ° °If unable to pay or uninsured, contact:  [facility] or [facility] to become qualified for the adult dental clinic. ° °No matter what dental problem you have, it will not get better unless you get good dental care.  If the tooth is not taken care of, your symptoms will come back in time and you will be visiting us again in the Urgent Care Center with a bad toothache.  So, see your dentist as soon as possible.  If you don't have a dentist, we can give you a list of dentists.  Sometimes the most cost effective treatment is removal of the tooth.  This can be done very inexpensively through one of the low cost [facility] Centers such as the facility on [address] in [place] ([phone]).  The downside to this is that you will have one less tooth and this can effect your ability to chew. ° °Some other things that can be done for a dental infection include the following: ° °· Rinse your mouth out with hot salt water (1/2 tsp of table salt and a pinch of baking soda in 8 oz of hot water).  You can do this every 2 or 3 hours. °· Avoid cold foods, beverages, and cold air.  This will make your symptoms worse. °· Sleep with your head elevated.  Sleeping flat will cause your gums and oral tissues to swell and make them hurt  more.  You can sleep on several pillows.  Even better is to sleep in a recliner with your head higher than your heart. °· For mild to moderate pain, you can take Tylenol, ibuprofen, or Aleve. °· External application of heat by a heating pad, hot water bottle, or hot wet towel can help with pain and speed healing.  You can do this every 2 to 3 hours. Do not fall asleep on a heating pad since this can cause a burn.  ° °Go to www.goodrx.com to look up your medications. This will give you a list of where you can find your prescriptions at the most affordable prices.  ° °RESOURCE GUIDE ° °Dental Problems ° °Look up http://www.ncdental.org/ncds/Schedule.asp for a schedule of the [facility] [employer]'s free dental clinics called [facility] [facility]. They have clinics all around [place]. Get there early and be prepared to wait.  ° °[facility] °[address]  [place], [place]   [address] [phone]  [facility] [address] [place], [place] [phone]  Patients with Medicaid: [facility] [phone]                                [phone] [address] [employer] [address] Phone:  [phone]                                                  Phone:  [phone]  If unable to pay or uninsured, contact:  [facility] or [facility] to become qualified for the adult dental clinic.  Temporomandibular Problems  Temporomandibular joint (TMJ) dysfunction means there are problems with the joint between your jaw and your skull. This is a joint lined by cartilage like other joints in your body but also has a small disc in the joint which keeps the bones from rubbing on each other. These joints are like other joints and can get inflamed (sore) from arthritis and other problems. When this joint gets sore, it can cause headaches and pain in the jaw and the face. CAUSES  Usually the arthritic types of problems are  caused by soreness in the joint. Soreness in the joint can also be caused by overuse. This may come from grinding your teeth. It may also come from mis-alignment in the joint. DIAGNOSIS Diagnosis of this condition can often be made by history and exam. Sometimes your caregiver may need X-rays or an MRI scan to determine the exact cause. It may be necessary to see your dentist to determine if your teeth and jaws are lined up correctly. TREATMENT  Most of the time this problem is not serious; however, sometimes it can persist (become chronic). When this happens medications that will cut down on inflammation (soreness) help. Sometimes a shot of cortisone into the joint will be helpful. If your teeth are not aligned it may help for your dentist to make a splint for your mouth that can help this problem. If no physical problems can be found, the problem may come from tension. If tension is found to be the cause, biofeedback or relaxation techniques may be helpful. HOME CARE INSTRUCTIONS   Later in the day, applications of ice packs may be helpful. Ice can be used in a plastic bag with a towel around it to prevent frostbite to skin. This may be used about every 2 hours for 20 to 30 minutes, as needed while awake, or as directed by your caregiver.  Only take over-the-counter or prescription medicines for pain, discomfort, or fever as directed by your caregiver.  If physical therapy was prescribed, follow your caregiver's directions.  Wear mouth appliances as directed if they were given. Document Released: 08/27/2001 Document Revised: 02/24/2012 Document Reviewed: 12/04/2008 [facility] Patient Information 2015 [place], [place]. This information is not intended to replace advice given to you by your health care provider. Make sure you discuss any questions you have with your health care provider.

## 2014-08-11 NOTE — ED Provider Notes (Signed)
Chief Complaint   Chief Complaint  Patient presents with  . Dental Pain  . Otalgia    History of Present Illness   [name] is a 23 year old female who's had a one half month history of a constant right-sided facial pain. This seems to be centered around the right TMJ, radiating to the ear, the side of the head, the side of the face, jaw, and the neck. There is no swelling. The pain is worse when she gets upset or with bright lights or loud noises. She denies any nausea or vomiting. The pain is better with Aleve and Excedrin. There is no drainage from the ear, difficulty hearing, ringing in the ear. She denies any fever, chills, nasal congestion, or rhinorrhea. She has no adenopathy or stiff neck. She denies any neurological symptoms.  Review of Systems   Other than as noted above, the patient denies any of the following symptoms: Systemic:  No fevers or chills. Eye:  No redness, pain, discharge, itching, blurred vision, or diplopia. ENT:  No headache, nasal congestion, sneezing, itching, epistaxis, ear pain, decreased hearing, ringing in ears, vertigo, or tinnitus.  No oral lesions, sore throat, or hoarseness. Neck:  No neck pain or adenopathy. Skin:  No rash or itching.  PMFSH   Past medical history, family history, social history, meds, and allergies were reviewed. She has a history of narcolepsy, depression, and GERD. She's not taking any medication right now.  Physical Examination     Vital signs:  BP 129/62  Pulse 93  Temp(Src) 98.1 F (36.7 C) (Oral)  Resp 18  SpO2 100% General:  Alert and oriented.  In no distress.  Skin warm and dry. Eye:  PERRL, full EOMs, lids and conjunctiva normal.   ENT:  TMs and canals clear.  Nasal mucosa not congested and without drainage.  Mucous membranes moist, no oral lesions, normal dentition, pharynx clear.  No cranial or facial pain to palplation. There is no pain or swelling over the mastoid. She has widespread dental decay,  particularly involving the right, lower third molar. This is partially decayed. There is no surrounding erythema of the gingiva no collection of pus. It is minimally tender to palpation right now. The pharynx is clear and airway is widely patent. There is no swelling of the floor the mouth. The TMJ and has a full range of motion with no clicking or popping, however there is some pain to palpation over the TMJ. Neck:  Supple, full ROM.  No adenopathy, tenderness or mass.  Thyroid normal. Lungs:  Breath sounds clear and equal bilaterally.  No wheezes, rales or rhonchi. Heart:  Rhythm regular, without extrasystoles.  No gallops or murmers. Skin:  Clear, warm and dry.  Assessment   The primary encounter diagnosis was Dental abscess. 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  Differential diagnoses is between ago abscess in TMJ syndrome. She may actually have both, although I feel the dental problem is her main issue right now. Unfortunately she has no insurance and no money and can't afford to see a Education officer, community.  Plan    1.  Meds:  The following meds were prescribed:   Discharge Medication List as of 08/11/2014 10:38 AM    START taking these medications   Details  amoxicillin (AMOXIL) 500 MG capsule Take 1 capsule (500 mg total) by mouth 3 (three) times daily., Starting 08/11/2014, Until Discontinued, Print    meloxicam (MOBIC) 15 MG tablet Take 1 tablet (15  mg total) by mouth daily., Starting 08/11/2014, Until Discontinued, Print        2.  Patient Education/Counseling:  The patient was given appropriate handouts, self care instructions, and instructed in symptomatic relief.  She was given the name of the on-call dentist for today plus several other dental resources.  3.  Follow up:  The patient was told to follow up here if no better in 3 to 4 days, or sooner if becoming worse in any way, and given some red flag symptoms such as fever, chills, more severe headache, visual, or  neurological symptoms which would prompt immediate return.       Reuben Likes, MD 08/11/14 1058

## 2014-08-11 NOTE — ED Notes (Signed)
Patient states has had R lower dental pain x 2 months.   Patient states has not been to a dentist and has no insurance.  Patient states R ear pain.

## 2014-12-19 ENCOUNTER — Encounter (HOSPITAL_COMMUNITY): Payer: Self-pay | Admitting: *Deleted

## 2014-12-19 ENCOUNTER — Emergency Department (HOSPITAL_COMMUNITY)
Admission: EM | Admit: 2014-12-19 | Discharge: 2014-12-19 | Disposition: A | Discharge status: Home / Self Care | Payer: Medicaid Other | Attending: Emergency Medicine | Admitting: Emergency Medicine

## 2014-12-19 DIAGNOSIS — Z791 Long term (current) use of non-steroidal anti-inflammatories (NSAID): Secondary | ICD-10-CM | POA: Insufficient documentation

## 2014-12-19 DIAGNOSIS — K029 Dental caries, unspecified: Secondary | ICD-10-CM

## 2014-12-19 DIAGNOSIS — E669 Obesity, unspecified: Secondary | ICD-10-CM | POA: Insufficient documentation

## 2014-12-19 DIAGNOSIS — Z8669 Personal history of other diseases of the nervous system and sense organs: Secondary | ICD-10-CM | POA: Insufficient documentation

## 2014-12-19 DIAGNOSIS — J45909 Unspecified asthma, uncomplicated: Secondary | ICD-10-CM | POA: Insufficient documentation

## 2014-12-19 DIAGNOSIS — K088 Other specified disorders of teeth and supporting structures: Secondary | ICD-10-CM | POA: Insufficient documentation

## 2014-12-19 MED ORDER — AMOXICILLIN 500 MG PO CAPS
500.0000 mg | ORAL_CAPSULE | Freq: Once | ORAL | Status: AC
  Administered 2014-12-19: 500 mg via ORAL
  Filled 2014-12-19: qty 1

## 2014-12-19 MED ORDER — AMOXICILLIN 500 MG PO CAPS: 500.0000 mg | ORAL_CAPSULE | Freq: Three times a day (TID) | ORAL | Status: DC

## 2014-12-19 MED ORDER — HYDROCODONE-ACETAMINOPHEN 5-325 MG PO TABS: ORAL_TABLET | ORAL | Status: DC

## 2014-12-19 MED ORDER — BUPIVACAINE-EPINEPHRINE (PF) 0.5% -1:200000 IJ SOLN
1.8000 mL | Freq: Once | INTRAMUSCULAR | Status: AC
  Administered 2014-12-19: 1.8 mL
  Filled 2014-12-19: qty 1.8

## 2014-12-19 NOTE — ED Notes (Addendum)
Pt observed sleeping soundly upon entering room and continued  falling asleep during assessment . Pt reports she has a HX of Narcolepsy.

## 2014-12-19 NOTE — Discharge Instructions (Signed)
Take vicodin for breakthrough pain, do not drink alcohol, drive, care for children or do other critical tasks while taking vicodin.  Return to the emergency room for fever, change in vision, redness to the face that rapidly spreads towards the eye, nausea or vomiting, difficulty swallowing or shortness of breath.   Apply warm compresses to jaw throughout the day.   Take your antibiotics as directed and to the end of the course.   Followup with a dentist is very important for ongoing evaluation and management of recurrent dental pain. Return to emergency department for emergent changing or worsening symptoms."  Low-cost dental clinic: [facility]  at [phone]**  **Nuala Alpha at [phone] [address]**    You may also call [phone]  Dental Assistance If the dentist on-call cannot see you, please use the resources below:   Patients with Medicaid: [facility] [phone] [address], [phone] [address] [phone]  If unable to pay, or uninsured, contact [facility] [phone]) or [facility] [phone] in [place], [phone] in [facility]) to become qualified for the adult dental clinic  Other Low-Cost Community Dental Services: [facility]- [address] [place], [place], [address]    [phone], Ext. [phone]    2nd and 4th Thursday of the month at 6:30am    10 clients each day by appointment, can sometimes see walk-in     patients if someone does not show for an appointment [facility]- [address] [place], [place], [address]    [phone] [facility] [address], [place], [place], [address]    [phone]  [facility]- [phone] [facility]- [phone] [facility] [phone]   Dental Caries Dental caries (also called tooth decay) is the most common oral disease. It can occur at any age but is more  common in children and young adults.  HOW DENTAL CARIES DEVELOPS  The process of decay begins when bacteria and foods (particularly sugars and starches) combine in your mouth to produce plaque. Plaque is a substance that sticks to the hard, outer surface of a tooth (enamel). The bacteria in plaque produce acids that attack enamel. These acids may also attack the root surface of a tooth (cementum) if it is exposed. Repeated attacks dissolve these surfaces and create holes in the tooth (cavities). If left untreated, the acids destroy the other layers of the tooth.  RISK FACTORS  Frequent sipping of sugary beverages.   Frequent snacking on sugary and starchy foods, especially those that easily get stuck in the teeth.   Poor oral hygiene.   Dry mouth.   Substance abuse such as methamphetamine abuse.   Broken or poor-fitting dental restorations.   Eating disorders.   Gastroesophageal reflux disease (GERD).   Certain radiation treatments to the head and neck. SYMPTOMS In the early stages of dental caries, symptoms are seldom present. Sometimes white, chalky areas may be seen on the enamel or other tooth layers. In later stages, symptoms may include:  Pits and holes on the enamel.  Toothache after sweet, hot, or cold foods or drinks are consumed.  Pain around the tooth.  Swelling around the tooth. DIAGNOSIS  Most of the time, dental caries is detected during a regular dental checkup. A diagnosis is made after a thorough medical and dental history is taken and the surfaces of your teeth are checked for signs of dental caries. Sometimes special instruments, such as  lasers, are used to check for dental caries. Dental X-ray exams may be taken so that areas not visible to the eye (such as between the contact areas of the teeth) can be checked for cavities.  TREATMENT  If dental caries is in its early stages, it may be reversed with a fluoride treatment or an application of a  remineralizing agent at the dental office. Thorough brushing and flossing at home is needed to aid these treatments. If it is in its later stages, treatment depends on the location and extent of tooth destruction:   If a small area of the tooth has been destroyed, the destroyed area will be removed and cavities will be filled with a material such as gold, silver amalgam, or composite resin.   If a large area of the tooth has been destroyed, the destroyed area will be removed and a cap (crown) will be fitted over the remaining tooth structure.   If the center part of the tooth (pulp) is affected, a procedure called a root canal will be needed before a filling or crown can be placed.   If most of the tooth has been destroyed, the tooth may need to be pulled (extracted). HOME CARE INSTRUCTIONS You can prevent, stop, or reverse dental caries at home by practicing good oral hygiene. Good oral hygiene includes:  Thoroughly cleaning your teeth at least twice a day with a toothbrush and dental floss.   Using a fluoride toothpaste. A fluoride mouth rinse may also be used if recommended by your dentist or health care provider.   Restricting the amount of sugary and starchy foods and sugary liquids you consume.   Avoiding frequent snacking on these foods and sipping of these liquids.   Keeping regular visits with a dentist for checkups and cleanings. PREVENTION   Practice good oral hygiene.  Consider a dental sealant. A dental sealant is a coating material that is applied by your dentist to the pits and grooves of teeth. The sealant prevents food from being trapped in them. It may protect the teeth for several years.  Ask about fluoride supplements if you live in a community without fluorinated water or with water that has a low fluoride content. Use fluoride supplements as directed by your dentist or health care provider.  Allow fluoride varnish applications to teeth if directed by your  dentist or health care provider. Document Released: 08/24/2002 Document Revised: 04/18/2014 Document Reviewed: 12/04/2012 [facility] Patient Information 2015 [employer] [place]. This information is not intended to replace advice given to you by your health care provider. Make sure you discuss any questions you have with your health care provider.

## 2014-12-19 NOTE — ED Provider Notes (Signed)
CSN: [id number]     Arrival date & time 12/19/14  1554 History  This chart was scribed for non-physician practitioner, Wynetta Emery, PA-C working with Joya Gaskins, MD by Greggory Stallion, ED scribe. This patient was seen in room TR05C/TR05C and the patient's care was started at 4:37 PM.   Chief Complaint  Patient presents with  . Dental Pain   The history is provided by the patient. No language interpreter was used.    HPI Comments: [name] is a 24 y.o. female who presents to the Emergency Department complaining of right lower dental pain that started one year ago and worsened a few days ago. States her wisdom tooth has started to come in. Reports associated chills. Pain radiates into her right ear and is starting to cause a headache. Pt has taken aleve with no relief. Denies fever. Denies history of diabetes.   Past Medical History  Diagnosis Date  . Asthma   . Exogenous obesity   . Poor sleep hygiene   . 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 Narcolepsy cataplexy syndrome    Past Surgical History  Procedure Laterality Date  . None     Family History  Problem Relation Age of Onset  . Depression     History  Substance Use Topics  . Smoking status: Never Smoker   . Smokeless tobacco: Not on file  . Alcohol Use: No   OB History    No data available     Review of Systems  Constitutional: Positive for chills. Negative for fever.  HENT: Positive for dental problem and ear pain.   Neurological: Positive for headaches.  All other systems reviewed and are negative.  Allergies  Review of patient's allergies indicates no known allergies.  Home Medications   Prior to Admission medications   Medication Sig Start Date End Date Taking? Authorizing Provider  amoxicillin (AMOXIL) 500 MG capsule Take 1 capsule (500 mg total) by mouth 3 (three) times daily. 12/19/14   Zandria Woldt, PA-C  HYDROcodone-acetaminophen (NORCO/VICODIN) 5-325 MG per tablet Take 1-2 tablets by mouth every 6 hours as needed for pain and/or cough. 12/19/14   Anaijah Augsburger, PA-C  ibuprofen (ADVIL,MOTRIN) 800 MG tablet Take 1 tablet (800 mg total) by mouth 3 (three) times daily. 10/16/13   Roxy Horseman, PA-C  meloxicam (MOBIC) 15 MG tablet Take 1 tablet (15 mg total) by mouth daily. 08/11/14   Reuben Likes, MD  montelukast (SINGULAIR) 10 MG tablet Take 1 tablet (10 mg total) by mouth at bedtime. 06/15/12 06/15/13  Linna Hoff, MD   BP 132/88 mmHg  Pulse 90  Temp(Src) 98.4 F (36.9 C) (Oral)  Resp 18  SpO2 100%  LMP  (LMP Unknown)   Physical Exam  Constitutional: She is oriented to person, place, and time. She appears well-developed and well-nourished. No distress.  HENT:  Head: Normocephalic.  Mouth/Throat: Uvula is midline and oropharynx is clear and moist. No trismus in the jaw. No uvula swelling. No oropharyngeal exudate, posterior oropharyngeal edema, posterior oropharyngeal erythema or tonsillar abscesses.    Generally poor dentition, no gingival swelling, erythema or tenderness to palpation. Patient is handling their secretions. There is no tenderness to palpation or firmness underneath tongue bilaterally. No trismus.    Eyes: Conjunctivae and EOM are normal. Pupils are equal, round, and reactive to light.  Cardiovascular: Normal rate.   Pulmonary/Chest: Effort  normal and breath sounds normal. No stridor.  Abdominal: Soft.  Musculoskeletal: Normal range of motion.  Lymphadenopathy:    She has no cervical adenopathy.  Neurological: She is alert and oriented to person, place, and time.  Psychiatric: She has a normal mood and affect.  Nursing note and vitals reviewed.   ED Course  NERVE BLOCK Date/Time: 12/20/2014 12:05 AM Performed by: Wynetta Emery Authorized by: Wynetta Emery Consent: Verbal consent obtained. Risks and benefits: risks, benefits and  alternatives were discussed Consent given by: patient Required items: required blood products, implants, devices, and special equipment available Patient identity confirmed: verbally with patient Indications: pain relief Body area: face/mouth Nerve: inferior alveolar Laterality: right Patient sedated: no Patient position: sitting Needle gauge: 27 G Location technique: anatomical landmarks Local anesthetic: bupivacaine 0.5% with epinephrine Anesthetic total: 1.8 ml Outcome: pain improved Patient tolerance: Patient tolerated the procedure well with no immediate complications   (including critical care time)  DIAGNOSTIC STUDIES: Oxygen Saturation is 97% on RA, normal by my interpretation.    COORDINATION OF CARE: 4:39 PM-Discussed treatment plan which includes dental block, an antibiotic and narcotic pain medication with pt at bedside and pt agreed to plan. Will give pt dental referrals and advised her to follow up.   Labs Review Labs Reviewed - No data to display  Imaging Review No results found.   EKG Interpretation None      MDM   Final diagnoses:  Pain due to dental caries    Filed Vitals:   12/19/14 1559 12/19/14 1739  BP: 147/83 132/88  Pulse: 103 90  Temp: 98.2 F (36.8 C) 98.4 F (36.9 C)  TempSrc: Oral Oral  Resp: 24 18  SpO2: 97% 100%    Medications  bupivacaine-epinephrine (MARCAINE W/ EPI) 0.5% -1:200000 injection 1.8 mL (1.8 mLs Infiltration Given 12/19/14 1650)  amoxicillin (AMOXIL) capsule 500 mg (500 mg Oral Given 12/19/14 1710)    [name] is a pleasant 24 y.o. female presenting with dental pain associated with dental caries but no signs or symptoms of dental abscess. Inferior alveolar block given with good relief. Patient afebrile, non toxic appearing and swallowing secretions well. I gave patient referral to dentist and stressed the importance of dental follow up for definitive management of dental issues. Patient voices understanding  and is agreeable to plan. She will be given amoxicillin and Vicodin. I've given her a resource guide and dental referral.  Evaluation does not show pathology that would require ongoing emergent intervention or inpatient treatment. Pt is hemodynamically stable and mentating appropriately. Discussed findings and plan with patient/guardian, who agrees with care plan. All questions answered. Return precautions discussed and outpatient follow up given.    I personally performed the services described in this documentation, which was scribed in my presence. The recorded information has been reviewed and is accurate.  Wynetta Emery, PA-C 12/20/14 0007  Joya Gaskins, MD 12/20/14 [phone]

## 2014-12-19 NOTE — ED Notes (Signed)
Pt reports right side dental pain due to wisdom tooth coming in sideways. Pt having right side head pain, face, pain and ear pain. Airway intact.

## 2014-12-26 ENCOUNTER — Encounter (HOSPITAL_COMMUNITY): Payer: Self-pay | Admitting: Family Medicine

## 2014-12-26 ENCOUNTER — Ambulatory Visit: Payer: Medicaid Other | Attending: Internal Medicine

## 2014-12-26 ENCOUNTER — Emergency Department (HOSPITAL_COMMUNITY)
Admission: EM | Admit: 2014-12-26 | Discharge: 2014-12-26 | Disposition: A | Discharge status: Home / Self Care | Payer: Medicaid Other | Attending: Emergency Medicine | Admitting: Emergency Medicine

## 2014-12-26 DIAGNOSIS — Z792 Long term (current) use of antibiotics: Secondary | ICD-10-CM | POA: Insufficient documentation

## 2014-12-26 DIAGNOSIS — J45909 Unspecified asthma, uncomplicated: Secondary | ICD-10-CM | POA: Insufficient documentation

## 2014-12-26 DIAGNOSIS — E669 Obesity, unspecified: Secondary | ICD-10-CM | POA: Insufficient documentation

## 2014-12-26 DIAGNOSIS — Z72821 Inadequate sleep hygiene: Secondary | ICD-10-CM | POA: Insufficient documentation

## 2014-12-26 DIAGNOSIS — Z791 Long term (current) use of non-steroidal anti-inflammatories (NSAID): Secondary | ICD-10-CM | POA: Insufficient documentation

## 2014-12-26 DIAGNOSIS — G8929 Other chronic pain: Secondary | ICD-10-CM | POA: Insufficient documentation

## 2014-12-26 DIAGNOSIS — Z8669 Personal history of other diseases of the nervous system and sense organs: Secondary | ICD-10-CM | POA: Insufficient documentation

## 2014-12-26 DIAGNOSIS — K089 Disorder of teeth and supporting structures, unspecified: Secondary | ICD-10-CM

## 2014-12-26 DIAGNOSIS — K011 Impacted teeth: Secondary | ICD-10-CM | POA: Insufficient documentation

## 2014-12-26 MED ORDER — NAPROXEN 500 MG PO TABS: 500.0000 mg | ORAL_TABLET | Freq: Two times a day (BID) | ORAL | Status: DC | PRN

## 2014-12-26 MED ORDER — HYDROCODONE-ACETAMINOPHEN 5-325 MG PO TABS
1.0000 | ORAL_TABLET | Freq: Once | ORAL | Status: AC
  Administered 2014-12-26: 1 via ORAL
  Filled 2014-12-26: qty 1

## 2014-12-26 MED ORDER — HYDROCODONE-ACETAMINOPHEN 5-325 MG PO TABS: 1.0000 | ORAL_TABLET | Freq: Four times a day (QID) | ORAL | Status: DC | PRN

## 2014-12-26 NOTE — ED Notes (Signed)
pt here for dental pain x 1 year. sts her wisdom teeth

## 2014-12-26 NOTE — ED Provider Notes (Signed)
CSN: [id number]     Arrival date & time 12/26/14  1237 History  This chart was scribed for non-physician practitioner, Allen Derry, PA-C working with Nelia Shi, MD by Freida Busman, ED Scribe. This patient was seen in room TR07C/TR07C and the patient's care was started at 1:20 PM.    Chief Complaint  Patient presents with  . Dental Pain    Patient is a 24 y.o. female presenting with tooth pain. The history is provided by the patient. No language interpreter was used.  Dental Pain Location:  Lower Lower teeth location:  32/RL 3rd molar Quality:  Sharp Severity:  Moderate Onset quality:  Gradual Duration:  1 year Timing:  Constant Progression:  Unchanged Chronicity:  Chronic Context comment:  Impacted wisdom tooth Previous work-up:  Dental exam (advised to follow up with dental surgeon) Relieved by:  NSAIDs Worsened by:  Cold food/drink Ineffective treatments:  None tried Associated symptoms: no congestion, no difficulty swallowing, no drooling, no facial pain, no facial swelling, no fever, no gum swelling, no headaches, no neck pain, no neck swelling, no oral bleeding, no oral lesions and no trismus   Risk factors: no chewing tobacco use and no smoking      HPI Comments:  Shizue A Ringle is a 24 y.o. female who presents to the Emergency Department complaining of 10/10 constant sharp pain to her bottom right wisdom tooth #32 for the last year. She notes wisdom teeth are growing in sideways. She was seen by a dentist on 12/22/14 and needed a referral to an oral surgeon which she has not done. Taking abx as directed. Her pain radiates to right ear and neck. She denies drainage from her ear. She denies recent change in pain. She has taken equate HA medicine which occasionally provides relief. She also denies fever, chills, neck and facial swelling, dysphagia, trismus, drooling, gum swelling/erythema/drainage, CP, SOB, nausea, vomiting, HA and blurry vision.   Past  Medical History  Diagnosis Date  . Asthma   . Exogenous obesity   . Poor sleep hygiene   . 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 Narcolepsy cataplexy syndrome    Past Surgical History  Procedure Laterality Date  . None     Family History  Problem Relation Age of Onset  . Depression     History  Substance Use Topics  . Smoking status: Never Smoker   . Smokeless tobacco: Not on file  . Alcohol Use: No   OB History    No data available     Review of Systems  Constitutional: Negative for fever and chills.  HENT: Positive for dental problem. Negative for congestion, drooling, facial swelling, mouth sores, sore throat and trouble swallowing.   Eyes: Negative for visual disturbance.  Respiratory: Negative for shortness of breath.   Cardiovascular: Negative for chest pain.  Gastrointestinal: Negative for nausea, vomiting and abdominal pain.  Musculoskeletal: Negative for neck pain and neck stiffness.  Skin: Negative for rash.  Neurological: Negative for weakness, numbness and headaches.  Hematological: Negative for adenopathy.    10 Systems reviewed and all are negative for acute change except as noted in the HPI.    Allergies  Review of patient's allergies indicates no known allergies.  Home Medications   Prior to Admission medications   Medication Sig Start Date End Date Taking? Authorizing Provider  amoxicillin (AMOXIL) 500 MG capsule Take 1 capsule (  500 mg total) by mouth 3 (three) times daily. 12/19/14   Nicole Pisciotta, PA-C  HYDROcodone-acetaminophen (NORCO/VICODIN) 5-325 MG per tablet Take 1-2 tablets by mouth every 6 hours as needed for pain and/or cough. 12/19/14   Nicole Pisciotta, PA-C  ibuprofen (ADVIL,MOTRIN) 800 MG tablet Take 1 tablet (800 mg total) by mouth 3 (three) times daily. 10/16/13   Roxy Horsemanobert Browning, PA-C  meloxicam (MOBIC) 15 MG tablet Take 1 tablet (15 mg  total) by mouth daily. 08/11/14   Reuben Likesavid C Keller, MD  montelukast (SINGULAIR) 10 MG tablet Take 1 tablet (10 mg total) by mouth at bedtime. 06/15/12 06/15/13  Linna HoffJames D Kindl, MD   BP 131/53 mmHg  Pulse 97  Temp(Src) 98.6 F (37 C)  Resp 18  SpO2 98%  LMP  (LMP Unknown) Physical Exam  Constitutional: She is oriented to person, place, and time. Vital signs are normal. She appears well-developed and well-nourished.  Non-toxic appearance. No distress.  Afebrile, nontoxic  HENT:  Head: Normocephalic and atraumatic.  Mouth/Throat: Oropharynx is clear and moist and mucous membranes are normal. No trismus in the jaw. Abnormal dentition. No dental abscesses, uvula swelling or dental caries.    R lower molar #32 impacted with TTP, no gingival swelling or erythema, no abscess  Eyes: Conjunctivae and EOM are normal. Right eye exhibits no discharge. Left eye exhibits no discharge.  Neck: Normal range of motion. Neck supple.  Cardiovascular: Normal rate and intact distal pulses.   Pulmonary/Chest: Effort normal. No respiratory distress.  Abdominal: Normal appearance. She exhibits no distension.  Musculoskeletal: Normal range of motion.  Lymphadenopathy:       Head (right side): No submandibular and no tonsillar adenopathy present.       Head (left side): No submandibular and no tonsillar adenopathy present.    She has no cervical adenopathy.  No head/neck LAD  Neurological: She is alert and oriented to person, place, and time. She has normal strength. No sensory deficit.  Skin: Skin is warm, dry and intact.  Psychiatric: She has a normal mood and affect.  Nursing note and vitals reviewed.   ED Course  Procedures   DIAGNOSTIC STUDIES:  Oxygen Saturation is 98% on RA, normal by my interpretation.    COORDINATION OF CARE:  1:24 PM Discussed treatment plan with pt at bedside and pt agreed to plan.  Labs Review Labs Reviewed - No data to display  Imaging Review No results found.   EKG  Interpretation None      MDM   Final diagnoses:  Impacted third molar tooth  Chronic dental pain    24 y.o. female with Dental pain associated with impacted molar, already seen by dentist and just needing referral to oral surgeon, with patient afebrile, non toxic appearing and swallowing secretions well. I gave patient referral to dentist and stressed the importance of dental follow up for ultimate management of dental pain.  I have also discussed reasons to return immediately to the ER.  Patient expresses understanding and agrees with plan.  Will give pain meds orally here, and to go home with. Oral surgeon referral given. Abx given at last visit, no need for ongoing abx.  I personally performed the services described in this documentation, which was scribed in my presence. The recorded information has been reviewed and is accurate.  BP 131/53 mmHg  Pulse 97  Temp(Src) 98.6 F (37 C)  Resp 18  SpO2 98%  LMP  (LMP Unknown)  Meds ordered this encounter  Medications  .  HYDROcodone-acetaminophen (NORCO/VICODIN) 5-325 MG per tablet 1 tablet    Sig:   . HYDROcodone-acetaminophen (NORCO) 5-325 MG per tablet    Sig: Take 1-2 tablets by mouth every 6 (six) hours as needed for severe pain.    Dispense:  6 tablet    Refill:  0    Order Specific Question:  Supervising Provider    Answer:  Eber Hong D [[id number]]  . naproxen (NAPROSYN) 500 MG tablet    Sig: Take 1 tablet (500 mg total) by mouth 2 (two) times daily as needed for mild pain, moderate pain or headache (TAKE WITH MEALS.).    Dispense:  20 tablet    Refill:  0    Order Specific Question:  Supervising Provider    Answer:  Vida Roller [address] [place], PA-C 12/26/14 1345  Nelia Shi, MD 12/27/14 [phone]

## 2014-12-26 NOTE — Discharge Instructions (Signed)
Apply warm compresses to jaw throughout the day. Use naprosyn and vicodin as directed, as needed for pain but do not drive or operate machinery with pain medication use. Followup with a oral surgeon is very important for ongoing evaluation and management of recurrent dental pain. See the surgeon above for ongoing management. Return to emergency department for emergent changing or worsening symptoms.     Dental Pain Toothache is pain in or around a tooth. It may get worse with chewing or with cold or heat.  HOME CARE  Your dentist may use a numbing medicine during treatment. If so, you may need to avoid eating until the medicine wears off. Ask your dentist about this.  Only take medicine as told by your dentist or doctor.  Avoid chewing food near the painful tooth until after all treatment is done. Ask your dentist about this. GET HELP RIGHT AWAY IF:   The problem gets worse or new problems appear.  You have a fever.  There is redness and puffiness (swelling) of the face, jaw, or neck.  You cannot open your mouth.  There is pain in the jaw.  There is very bad pain that is not helped by medicine. MAKE SURE YOU:   Understand these instructions.  Will watch your condition.  Will get help right away if you are not doing well or get worse. Document Released: 05/20/2008 Document Revised: 02/24/2012 Document Reviewed: 05/20/2008 [facility] Patient Information 2015 [place], [place]. This information is not intended to replace advice given to you by your health care provider. Make sure you discuss any questions you have with your health care provider.  Impacted Molar Molars are the teeth in the back of your mouth. You have 12 molars. There are 6 molars in each jaw, 3 on each side. When they grow in (erupt) they sometimes cause problems. Molars trapped inside the gum are impacted molars. Impacted molars may grow sideways, tilted, or may only partially emerge. Molars erupt at different times  in life. The first set of molars usually erupts around 386 to 26 years of age. The second set of molars typically erupts around 4311 to 26 years of age. The third set of molars are called wisdom teeth. These molars usually do not have enough space to erupt properly. Many teens and young adults develop impacted wisdom teeth and have them surgically removed (extracted). However, any molar or set of molars may become impacted. CAUSES  Teeth that are crowded are often the reason for an impacted molar, but sometimes a cyst or tumor may cause impaction of molars. SYMPTOMS  Sometimes there are no symptoms and an impacted molar is noticed during an exam or X-ray. If there are symptoms they may include:  Pain.  Swelling, redness, or inflammation near the impacted tooth or teeth.  Stiff jaw.  General feeling of illness.  Bad breath.  Gap between the teeth.  Difficulty opening your mouth.  Headache or jaw ache.  Swollen lymph nodes. Impacted teeth may increase the risk of complications such as:  Infection, with possible drainage around the infected area.  Damage to nearby teeth.  Growth of cysts.  Chronic discomfort. DIAGNOSIS  Impacted molars are diagnosed by oral exam and X-rays. TREATMENT  The goal of treatment is to obtain the best possible arrangement of your teeth. Your dentist or orthodontist will recommend the best course of action for you. After an exam, your caregiver may recommend one or a combination of the following treatments.  Supportive home care to  manage pain and other symptoms until treatment can be started.  Surgical extraction of one or a combination of molars to leave room for emerging or later molars. Teeth must be extracted at appropriate times for the best results.  Surgical uncovering of tissue covering the impacted molar.  Orthodontic repositioning with the use of appliances such as elastic or metal separators, braces, wires, springs, and other removable or  fixed devices. This is done to guide the molar and surrounding teeth to grow in properly. In some cases, you may need some surgery to assist this procedure. Follow-up orthodontic treatment is often necessary with impacted first and second molars.  Antibiotics to treat infection. HOME CARE INSTRUCTIONS Rinse as directed with an antibacterial solution or salt and warm water. Follow up with your caregiver as directed, even if you do not have symptoms. If you are waiting for treatment and have pain:  Take pain medicines as directed.  Take your antibiotics as directed. Finish them even if you start to feel better.  Put ice on the affected area.  Put ice in a plastic bag.  Place a towel between your skin and the bag.  Leave the ice on for 15-20 minutes, 03-04 times a day. SEEK DENTAL CARE IF:  You have a fever.  Pain emerges, worsens, or is not controlled by the medicines you were given.  Swelling occurs.  You have difficulty opening your mouth or swallowing. MAKE SURE YOU:   Understand these instructions.  Will watch your condition.  Will get help right away if you are not doing well or get worse. Document Released: 07/31/2011 Document Revised: 02/24/2012 Document Reviewed: 07/31/2011 [facility] Patient Information 2015 [place], [place]. This information is not intended to replace advice given to you by your health care provider. Make sure you discuss any questions you have with your health care provider.

## 2015-01-20 ENCOUNTER — Ambulatory Visit: Payer: Medicaid Other | Attending: Family Medicine

## 2015-03-20 ENCOUNTER — Telehealth: Payer: Self-pay

## 2015-03-21 NOTE — Telephone Encounter (Signed)
Completed call.  

## 2015-03-24 ENCOUNTER — Telehealth: Payer: Self-pay

## 2015-03-24 NOTE — Telephone Encounter (Signed)
First attempt to reschedule visit with [facility], did not leave message.

## 2015-03-27 ENCOUNTER — Telehealth: Payer: Self-pay

## 2015-03-27 NOTE — Telephone Encounter (Signed)
Second attempt to reschedule appointment from 1:30pm to 2:30pm on 03/27/15.

## 2015-03-28 ENCOUNTER — Encounter: Payer: Self-pay | Admitting: Family Medicine

## 2015-03-28 ENCOUNTER — Ambulatory Visit: Payer: Medicaid Other | Attending: Family Medicine | Admitting: Family Medicine

## 2015-03-28 VITALS — BP 105/72 | HR 85 | Temp 98.7°F | Resp 18 | Ht 63.0 in | Wt 377.0 lb

## 2015-03-28 DIAGNOSIS — Z114 Encounter for screening for human immunodeficiency virus [HIV]: Secondary | ICD-10-CM | POA: Diagnosis not present

## 2015-03-28 DIAGNOSIS — D509 Iron deficiency anemia, unspecified: Secondary | ICD-10-CM

## 2015-03-28 DIAGNOSIS — G47419 Narcolepsy without cataplexy: Secondary | ICD-10-CM | POA: Diagnosis not present

## 2015-03-28 DIAGNOSIS — E559 Vitamin D deficiency, unspecified: Secondary | ICD-10-CM

## 2015-03-28 LAB — GLUCOSE, POCT (MANUAL RESULT ENTRY): POC Glucose: 92 mg/dl (ref 70–99)

## 2015-03-28 LAB — COMPLETE METABOLIC PANEL WITH GFR
ALT: 13 U/L (ref 0–35)
AST: 10 U/L (ref 0–37)
Albumin: 3.4 g/dL — ABNORMAL LOW (ref 3.5–5.2)
Alkaline Phosphatase: 115 U/L (ref 39–117)
BILIRUBIN TOTAL: 0.4 mg/dL (ref 0.2–1.2)
BUN: 9 mg/dL (ref 6–23)
CALCIUM: 8.5 mg/dL (ref 8.4–10.5)
CHLORIDE: 105 meq/L (ref 96–112)
CO2: 23 meq/L (ref 19–32)
Creat: 0.48 mg/dL — ABNORMAL LOW (ref 0.50–1.10)
GFR, Est Non African American: >89 mL/min
Glucose, Bld: 78 mg/dL (ref 70–99)
POTASSIUM: 4.3 meq/L (ref 3.5–5.3)
SODIUM: 137 meq/L (ref 135–145)
TOTAL PROTEIN: 6.7 g/dL (ref 6.0–8.3)

## 2015-03-28 LAB — CBC
HEMATOCRIT: 24.8 % — AB (ref 36.0–46.0)
Hemoglobin: 7 g/dL — ABNORMAL LOW (ref 12.0–15.0)
MCH: 15.3 pg — AB (ref 26.0–34.0)
MCHC: 28.2 g/dL — ABNORMAL LOW (ref 30.0–36.0)
MCV: 54.3 fL — AB (ref 78.0–100.0)
MPV: 8.4 fL — ABNORMAL LOW (ref 8.6–12.4)
Platelets: 463 10*3/uL — ABNORMAL HIGH (ref 150–400)
RBC: 4.57 MIL/uL (ref 3.87–5.11)
RDW: 20.6 % — AB (ref 11.5–15.5)
WBC: 8.1 10*3/uL (ref 4.0–10.5)

## 2015-03-28 LAB — TSH: TSH: 1.71 u[IU]/mL (ref 0.350–4.500)

## 2015-03-28 LAB — POCT GLYCOSYLATED HEMOGLOBIN (HGB A1C): Hemoglobin A1C: 5.3

## 2015-03-28 LAB — VITAMIN B12: VITAMIN B 12: 677 pg/mL (ref 211–911)

## 2015-03-28 NOTE — Assessment & Plan Note (Addendum)
Screening HIV ordered  Screening HIV negative  

## 2015-03-28 NOTE — Progress Notes (Signed)
   Subjective:    Patient ID: [name], female    DOB: 1991-09-18, 24 y.o.   MRN: [id number] CC: narcolepsy  HPI 24 yo F with morbid obesity and narcolepsy   1. Narcolepsy: still very sleep all the time. Can be walking across the street and fall asleep and fall. Usually does not sleep well at night. Wakes ups from 3 AM-6/7AM, in and out of sleep during the rest of the night. Sleeps better sitting up and during the day.  Had narcolepsy symptoms since age 24. Was diagnosed with narcolepsy at age 24.  Was taking ADHD  Medicine (Ritalin)  that helped her stay awake when she was younger. During HS her compliance with ADHD medicine was poor and her narcolepsy got worse.   Last sleep study: 02/21/12 IMPRESSIONS-RECOMMENDATIONS: 1. Pathologic daytime hypersomnia consistent with narcolepsy. The patient uses CPAP for sleep all night every night and wore CPAP 11 CWP with a medium ResMed Quattro full-face mask, and C flex setting of 3, for each nap in the current study.  2. Previous sleep studies: NPSG September 26, 2005, recorded AHI 5.3 per hour. Mean Sleep Latency Test on July 01, 2006, recorded sleep latency 0 minutes with REM on all 5 naps. Diagnostic NPSG on Apr 24, 2010, recorded AHI 2.2 per hour. Significant obstructive sleep  apnea is excluded. The patient is wearing CPAP. Sleep-disordered breathing is not considered a likely explanation for this profound degree of daytime hypersomnia.  Soc Hx: non smoker, not in school, not working. Would like to go to school if she could stay awake. Lives with mother, 2 brothers, 2 nephews.   Med Hx: narcolepsy  Surg Hx: none Fam Hx: HTN in mother  Review of Systems  Respiratory: Negative for chest tightness and shortness of breath.   Cardiovascular: Negative for chest pain.  Psychiatric/Behavioral: Positive for sleep disturbance, dysphoric mood and decreased concentration. Negative for suicidal ideas, hallucinations, behavioral problems,  confusion, self-injury and agitation. The patient is not nervous/anxious and is not hyperactive.       Objective:   Physical Exam BP 105/72 mmHg  Pulse 85  Temp(Src) 98.7 F (37.1 C) (Oral)  Resp 18  Ht 5\' 3"  (1.6 m)  Wt 377 lb (171.006 kg)  BMI 66.80 kg/m2  SpO2 100%  LMP 03/01/2015  Wt Readings from Last 3 Encounters:  03/28/15 377 lb (171.006 kg)  03/02/12 330 lb (149.687 kg)  02/22/12 330 lb (149.687 kg)  General appearance: alert, cooperative and morbidly obese Neck: no adenopathy, supple, symmetrical, trachea midline, thyroid not enlarged, symmetric, no tenderness/mass/nodules and hyperpigmented  Lungs: clear to auscultation bilaterally Heart: regular rate and rhythm, S1, S2 normal, no murmur, click, rub or gallop  Ext: obese, w/o edema        Assessment & Plan:

## 2015-03-28 NOTE — Assessment & Plan Note (Signed)
1. Narcolepsy with ADHD history: untreated  Will work on getting CPAP Referral to neurology Referral to psychiatry

## 2015-03-28 NOTE — Patient Instructions (Addendum)
Ms. [name],  Thank you for coming in today. It was a pleasure meeting you. I look forward to being your primary doctor.  Please apply for [employer] discount and orange card, you can also inquire if any of your medications are on the PASS (medications assistance) list.   1. Narcolepsy with ADHD history: Will work on getting CPAP Referral to neurology Referral to psychiatry  2. Obesity: Will work on getting CPAP Be as active as possible Low carb diet recommended.  Diet Recommendations for Diabetes   Starchy (carb) foods include: Bread, rice, pasta, potatoes, corn, crackers, bagels, muffins, all baked goods.   Protein foods include: Meat, fish, poultry, eggs, dairy foods, and beans such as pinto and kidney beans (beans also provide carbohydrate).   1. Eat at least 3 meals and 1-2 snacks per day. Never go more than 4-5 hours while awake without eating.  2. Limit starchy foods to TWO per meal and ONE per snack. ONE portion of a starchy  food is equal to the following:   - ONE slice of bread (or its equivalent, such as half of a hamburger bun).   - 1/2 cup of a "scoopable" starchy food such as potatoes or rice.   - 1 OUNCE (28 grams) of starchy snack foods such as crackers or pretzels (look on label).   - 15 grams of carbohydrate as shown on food label.  3. Both lunch and dinner should include a protein food, a carb food, and vegetables.   - Obtain twice as many veg's as protein or carbohydrate foods for both lunch and dinner.   - Try to keep frozen veg's on hand for a quick vegetable serving.     - Fresh or frozen veg's are best.  4. Breakfast should always include protein.    You will be called with lab results  F/u in 6 weeks  Dr. Armen PickupFunches

## 2015-03-28 NOTE — Assessment & Plan Note (Signed)
Obesity: gaining weight Will work on getting CPAP Be as active as possible Low carb diet recommended

## 2015-03-28 NOTE — Progress Notes (Signed)
Establish Care Bump under lt arm  Hx Narcolepsy

## 2015-03-29 DIAGNOSIS — E559 Vitamin D deficiency, unspecified: Secondary | ICD-10-CM | POA: Insufficient documentation

## 2015-03-29 LAB — VITAMIN D 25 HYDROXY (VIT D DEFICIENCY, FRACTURES): VIT D 25 HYDROXY: 9 ng/mL — AB (ref 30–100)

## 2015-03-29 LAB — HIV ANTIBODY (ROUTINE TESTING W REFLEX): HIV 1&2 Ab, 4th Generation: NONREACTIVE

## 2015-03-29 MED ORDER — FERROUS SULFATE 325 (65 FE) MG PO TABS: 325.0000 mg | ORAL_TABLET | Freq: Two times a day (BID) | ORAL | Status: DC

## 2015-03-29 MED ORDER — VITAMIN D (ERGOCALCIFEROL) 1.25 MG (50000 UNIT) PO CAPS: 50000.0000 [IU] | ORAL_CAPSULE | ORAL | Status: DC

## 2015-03-29 NOTE — Addendum Note (Signed)
Addended by: Dessa PhiFUNCHES, Triniti Gruetzmacher on: 03/29/2015 09:08 AM   Modules accepted: Orders

## 2015-03-29 NOTE — Assessment & Plan Note (Signed)
Vit D is low, needs vit D supplement

## 2015-03-29 NOTE — Assessment & Plan Note (Signed)
>>  ASSESSMENT AND PLAN FOR IRON  DEFICIENCY ANEMIA WRITTEN ON 03/29/2015  9:08 AM BY FUNCHES, JOSALYN, MD  Hgb is quite low, patient is quite anemia, needs iron  therapy.  Ferrous sulfate  325 mg BID

## 2015-03-29 NOTE — Assessment & Plan Note (Signed)
Hgb is quite low, patient is quite anemia, needs iron therapy.  Ferrous sulfate 325 mg BID

## 2015-03-30 ENCOUNTER — Telehealth: Payer: Self-pay | Admitting: *Deleted

## 2015-03-30 NOTE — Telephone Encounter (Signed)
LVM to return call.

## 2015-03-30 NOTE — Telephone Encounter (Signed)
-----   Message from Dessa PhiJosalyn Funches, MD sent at 03/29/2015  9:06 AM EDT ----- Vit D is low, needs vit D supplement Hgb is quite low, patient is quite anemia, needs iron therapy.

## 2015-04-04 ENCOUNTER — Telehealth: Payer: Self-pay | Admitting: Clinical

## 2015-04-04 NOTE — Telephone Encounter (Signed)
F/U on treatment plan progress- Pt successfully cut out caffeine after dinner for one week. No change in sleeptime yet. For upcoming week:  - Continue with no caffeine after dinner -change bedtime goal from 1am to 2am -implement daily walk -f/u in one week w [facility] Ilyana Manuele via telephone

## 2015-04-05 NOTE — Telephone Encounter (Signed)
Left voice message to return call 

## 2015-04-07 ENCOUNTER — Telehealth: Payer: Self-pay | Admitting: *Deleted

## 2015-04-07 NOTE — Telephone Encounter (Signed)
Patient asking about a cyst under her arm and what to do.  Advised patient to make an appointment with PCP-[facility]

## 2015-04-13 ENCOUNTER — Ambulatory Visit: Payer: Medicaid Other | Admitting: Family Medicine

## 2015-04-13 ENCOUNTER — Encounter: Payer: Self-pay | Admitting: Family Medicine

## 2015-04-13 ENCOUNTER — Ambulatory Visit: Payer: Medicaid Other | Attending: Family Medicine | Admitting: Family Medicine

## 2015-04-13 VITALS — BP 103/67 | HR 98 | Temp 98.4°F | Resp 16 | Ht 63.0 in | Wt 375.0 lb

## 2015-04-13 DIAGNOSIS — D509 Iron deficiency anemia, unspecified: Secondary | ICD-10-CM | POA: Diagnosis not present

## 2015-04-13 DIAGNOSIS — L02412 Cutaneous abscess of left axilla: Secondary | ICD-10-CM | POA: Diagnosis not present

## 2015-04-13 DIAGNOSIS — L723 Sebaceous cyst: Secondary | ICD-10-CM | POA: Insufficient documentation

## 2015-04-13 DIAGNOSIS — G47419 Narcolepsy without cataplexy: Secondary | ICD-10-CM

## 2015-04-13 DIAGNOSIS — Z6841 Body Mass Index (BMI) 40.0 and over, adult: Secondary | ICD-10-CM | POA: Insufficient documentation

## 2015-04-13 DIAGNOSIS — E559 Vitamin D deficiency, unspecified: Secondary | ICD-10-CM

## 2015-04-13 MED ORDER — CLINDAMYCIN HCL 300 MG PO CAPS
300.0000 mg | ORAL_CAPSULE | Freq: Four times a day (QID) | ORAL | Status: DC
Start: 2015-04-13 — End: 2015-05-26

## 2015-04-13 MED ORDER — FLUCONAZOLE 150 MG PO TABS: 150.0000 mg | ORAL_TABLET | ORAL | Status: DC

## 2015-04-13 MED ORDER — FERROUS SULFATE 325 (65 FE) MG PO TABS: 325.0000 mg | ORAL_TABLET | Freq: Two times a day (BID) | ORAL | Status: DC

## 2015-04-13 MED ORDER — VITAMIN D (ERGOCALCIFEROL) 1.25 MG (50000 UNIT) PO CAPS
50000.0000 [IU] | ORAL_CAPSULE | ORAL | Status: DC
Start: 2015-04-13 — End: 2015-09-08

## 2015-04-13 NOTE — Assessment & Plan Note (Addendum)
Narcolepsy: Ordered CPAP from CPAP assistance program- [employer]

## 2015-04-13 NOTE — Assessment & Plan Note (Signed)
1. L under arm swelling:   I am suspicious for soft tissue infection vs blocked sweat gland  Plan clindamycin for 10 day course Followed by diflucan to prevent yeast infection, vaginal  Use sensitive deodorant Trim hair instead of shaving

## 2015-04-13 NOTE — Progress Notes (Signed)
ASSESSMENT: Pt currently experiencing symptoms of depression as a result of narcolepsy. She needs to follow up with her upcoming  Appointments, and take her medications as prescribed. She would benefit from continuing to follow her treatment plan, and begin using the CPAP equipment when it arrives, to improve sleep.  Stage of Change: contemplative  PT AGREES WITH FOLLOWING TREATMENT PLAN: 1. F/U with behavioral health consultant in one week.  2. Psychiatric Medications: none 3. Behavioral recommendation(s):   -Continue to avoid caffeine after dinner -Continue striving for 2am bedtime -Consider going outside daily, including walks in the late afternoon -Continue striving for only a light snack before bedtime -Await arrival of CPAP to begin using for improved sleep  SUBJECTIVE: Pt. referred by Dr. Armen PickupFunches for CPAP referral Pt. here for  FOLLOW-UP regarding sleep apnea  Pt. reports the following symptoms/concerns: Pt states that she is successfully avoiding caffeine after dinner daily, but has not had success completing daily walks. She says that she hopes that using the CPAP will help her to sleep.  Duration of problem: >11 years Severity: severe   OBJECTIVE: Orientation & Cognition: Oriented x3. Thought processes normal and appropriate to situation. Mood:appropriate Affect: appropriate to lack of sleep Appearance: appropriate Risk of harm to self or others: no risk of harm to self or others Substance use: none Psychiatric medication use: Unchanged from prior contact. 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

## 2015-04-13 NOTE — Progress Notes (Signed)
   Subjective:    Patient ID: [name], female    DOB: Apr 03, 1991, 24 y.o.   MRN: [id number] CC: L under arm swelling, narcolepsy  HPI  1. L under arm lesion: pain with swelling x 3 months. No discharge. Area is tender. No skin changes. Shaving hurts so patient trims. She wears different types of deodorant, whatever she can get.  2. Narcolepsy: no change. Still needs CPAP. Still need psych.   3. IDA: has not stated iron.  4. Vit D def: has not stated vit D   Soc Hx: non smoker  Review of Systems  Constitutional: Positive for fatigue. Negative for fever and chills.       Objective:   Physical Exam BP 103/67 mmHg  Pulse 98  Temp(Src) 98.4 F (36.9 C) (Oral)  Resp 16  Ht 5\' 3"  (1.6 m)  Wt 375 lb (170.099 kg)  BMI 66.44 kg/m2  SpO2 99%  LMP 03/01/2015 General appearance: alert, cooperative, no distress and morbidly obese Skin: L axilla with 2x1.5 cm area of tenderness and swelling. No skin erythema. No dimpling. No adenopathy. No drainage.        Assessment & Plan:

## 2015-04-13 NOTE — Progress Notes (Signed)
Complaining of skin tag painfully x10 days ago

## 2015-04-13 NOTE — Patient Instructions (Addendum)
Ms. Pablo LawrenceClarida,  Thank you for coming in today.  1. L under arm swelling:   I am suspicious for soft tissue infection vs blocked sweat gland  Plan clindamycin for 10 day course Followed by diflucan to prevent yeast infection, vaginal  Use sensitive deodorant Trim hair instead of shaving   2.  Narcolepsy: Will order CPAP from CPAP assistance program   F/u in 6 weeks for L under arm swelling   Dr. Armen PickupFunches

## 2015-04-24 ENCOUNTER — Ambulatory Visit: Payer: Medicaid Other | Attending: Family Medicine

## 2015-05-01 ENCOUNTER — Telehealth: Payer: Self-pay | Admitting: Clinical

## 2015-05-01 NOTE — Telephone Encounter (Signed)
Completed call, see above 

## 2015-05-05 ENCOUNTER — Telehealth: Payer: Self-pay | Admitting: Family Medicine

## 2015-05-05 NOTE — Telephone Encounter (Signed)
LVM for pt to call back and schedule an appointment with the nurse for her CPAP machine.

## 2015-05-24 ENCOUNTER — Ambulatory Visit: Payer: Self-pay | Admitting: Family Medicine

## 2015-05-26 ENCOUNTER — Ambulatory Visit: Payer: Self-pay | Attending: Family Medicine | Admitting: Family Medicine

## 2015-05-26 ENCOUNTER — Encounter: Payer: Self-pay | Admitting: Family Medicine

## 2015-05-26 VITALS — BP 120/75 | HR 84 | Temp 98.0°F | Resp 16 | Wt 380.0 lb

## 2015-05-26 DIAGNOSIS — G47419 Narcolepsy without cataplexy: Secondary | ICD-10-CM | POA: Insufficient documentation

## 2015-05-26 DIAGNOSIS — L723 Sebaceous cyst: Secondary | ICD-10-CM | POA: Insufficient documentation

## 2015-05-26 DIAGNOSIS — K088 Other specified disorders of teeth and supporting structures: Secondary | ICD-10-CM | POA: Insufficient documentation

## 2015-05-26 DIAGNOSIS — K0889 Other specified disorders of teeth and supporting structures: Secondary | ICD-10-CM | POA: Insufficient documentation

## 2015-05-26 MED ORDER — IBUPROFEN 600 MG PO TABS
600.0000 mg | ORAL_TABLET | Freq: Four times a day (QID) | ORAL | Status: DC | PRN
Start: 2015-05-26 — End: 2015-09-08

## 2015-05-26 NOTE — Assessment & Plan Note (Signed)
CPAP education and CPAP provided to patient today

## 2015-05-26 NOTE — Patient Instructions (Signed)
Ms. Fortman  Thank you for coming in today   1. L under arm swelling: Drained yellow/clearish fluid consistent with cyst No abscess  Keep wound clean and dry If you develop severe pain, redness, drainage or fever seek immediate medical attention   F/u in 3 days for wound check and partial packing removal   Dr. Armen Pickup

## 2015-05-26 NOTE — Assessment & Plan Note (Signed)
A; molar pain w/o dental abscess P: dental referral placed

## 2015-05-26 NOTE — Progress Notes (Signed)
Patient complains of having some dental pain Patient was recently on ABT for some "bumps" under her left arm Patient states the "bumps" are still there and still bothering her

## 2015-05-26 NOTE — Assessment & Plan Note (Signed)
A: ID done today. Area was packed  P: Patient to return in 3 days for wound check and packing removal

## 2015-05-26 NOTE — Progress Notes (Signed)
   Subjective:    Patient ID: [name], female    DOB: 07/01/1991, 24 y.o.   MRN: [id number] CC: L axilla abscess, dental pain  HPI 24 yo F with morbid obesity, narcolepsy, sleep apnea  1. L axilla swelling: with pain. No drainage x 4 months now. No improvement with clindamycin. No fever.  2. Dental pain: R lower molar pain. No swelling. Soreness. No improvement with tylenol.  3. Sleep apnea: with narcolepsy. CPAP has arrived from [employer].   Soc Hx: non smoker  Review of Systems  Constitutional: Negative for fever and chills.  HENT: Positive for dental problem.        Objective:   Physical Exam BP 120/75 mmHg  Pulse 84  Temp(Src) 98 F (36.7 C)  Resp 16  Wt 380 lb (172.367 kg)  SpO2 100% General appearance: alert, cooperative, no distress and morbidly obese Throat: normal findings: lips normal without lesions, tongue midline and normal, soft palate, uvula, and tonsils normal and oropharynx pink & moist without lesions or evidence of thrush and abnormal findings: dentition: multiple carries Skin:  Tenderness and induration under L axilla, no erythema, no adenopathy   Incision and Drainage Procedure Note  Pre-operative Diagnosis: abscess vs cyst   Post-operative Diagnosis: cyst   Indications:  Pain and swelling   Anesthesia: 2% lidocaine with epinephrine  Procedure Details  The procedure, risks and complications have been discussed in detail (including, but not limited to airway compromise, infection, bleeding) with the patient, and the patient has signed consent to the procedure.  The skin was sterilely prepped and draped over the affected area in the usual fashion. After adequate local anesthesia, I&D with a #11 blade was performed on the left axilla. Purulent drainage: absent. Yellow tinged drainage present.  The patient was observed until stable.  Findings: Cyst   EBL: 5 cc's  Drains: 1/4 inch gauze about 4 cm    Condition: Tolerated procedure well and Stable   Complications: none.       Assessment & Plan:

## 2015-05-29 ENCOUNTER — Ambulatory Visit: Payer: Self-pay | Attending: Family Medicine | Admitting: *Deleted

## 2015-05-29 VITALS — Temp 97.9°F

## 2015-05-29 DIAGNOSIS — L723 Sebaceous cyst: Secondary | ICD-10-CM | POA: Insufficient documentation

## 2015-05-29 NOTE — Progress Notes (Signed)
Patient presents for wound check left axilla following cyst I and D on 05/26/15 Patient states she has noted a foul odor Dressing removed No foul odor noted Approx 6 mm packing material exposed Moderate amt of brown drainage noted on dressing No redness or warmth at wound site Patient rates pain 4/10 below incision with palpation only Packing material clipped near incision site Site cleaned and redressed  T 97.9 oral Patient instructed to keep wound site clean and dry Patient will return in 4 days for wound recheck

## 2015-06-02 ENCOUNTER — Ambulatory Visit: Payer: Self-pay

## 2015-06-05 ENCOUNTER — Ambulatory Visit: Payer: Self-pay | Attending: Family Medicine

## 2015-06-05 VITALS — BP 107/69 | HR 79 | Temp 98.0°F | Resp 18 | Ht 63.0 in | Wt 381.0 lb

## 2015-06-05 DIAGNOSIS — L723 Sebaceous cyst: Secondary | ICD-10-CM

## 2015-06-05 DIAGNOSIS — Z872 Personal history of diseases of the skin and subcutaneous tissue: Secondary | ICD-10-CM | POA: Insufficient documentation

## 2015-06-05 DIAGNOSIS — Z4801 Encounter for change or removal of surgical wound dressing: Secondary | ICD-10-CM | POA: Insufficient documentation

## 2015-06-05 NOTE — Progress Notes (Signed)
Patient here for wound check of left axilla.  Patient presents for wound check left axilla following cyst I and D on 05/26/15 Patient states she has noted a foul odor Dressing removed No foul odor noted Approx 1 mm packing material exposed Moderate amt of brown drainage noted on dressing No redness or warmth at wound site Patient rates pain 4/10 in axilla area. Packing material clipped near incision site Site cleaned and redressed  T 98.0 oral Patient instructed to keep wound site clean and dry Patient will return in 4 days for wound recheck

## 2015-06-05 NOTE — Patient Instructions (Signed)
Keep area dry and clean. Return to clinic t see nurse in 4 days for dressing change.

## 2015-06-09 ENCOUNTER — Ambulatory Visit: Payer: Self-pay | Attending: Family Medicine | Admitting: *Deleted

## 2015-06-09 VITALS — BP 109/72 | HR 83 | Temp 98.2°F | Resp 18

## 2015-06-09 DIAGNOSIS — Z4801 Encounter for change or removal of surgical wound dressing: Secondary | ICD-10-CM | POA: Insufficient documentation

## 2015-06-09 DIAGNOSIS — Z872 Personal history of diseases of the skin and subcutaneous tissue: Secondary | ICD-10-CM | POA: Insufficient documentation

## 2015-06-09 DIAGNOSIS — L723 Sebaceous cyst: Secondary | ICD-10-CM

## 2015-06-09 NOTE — Progress Notes (Signed)
Patient presents for wound check left axilla following cyst I and D on 05/26/15 Patient states she changes dressing daily when she notes a foul odor Dressing removed Dressing slight malodorous Approx 4 cm tannish/yellowish drainage noted on inside of dressing  Packing material visible at wound site, however, no packing material outside wound No redness or warmth noted at wound site Patient rates pain 4/10 above and to the left of incision with palpation and intermittently with left arm movement Site cleaned and redressed   Filed Vitals:   06/09/15 1730  BP: 109/72  Pulse: 83  Temp: 98.2 F (36.8 C)  Resp: 18   Patient instructed to keep wound site clean and dry Patient will return in 7 days for wound recheck by PCP

## 2015-06-16 ENCOUNTER — Encounter: Payer: Self-pay | Admitting: Family Medicine

## 2015-06-16 ENCOUNTER — Ambulatory Visit: Payer: Self-pay | Attending: Family Medicine | Admitting: Family Medicine

## 2015-06-16 VITALS — BP 116/74 | HR 78 | Temp 98.5°F | Resp 20 | Ht 63.0 in | Wt 373.0 lb

## 2015-06-16 DIAGNOSIS — Z6841 Body Mass Index (BMI) 40.0 and over, adult: Secondary | ICD-10-CM | POA: Insufficient documentation

## 2015-06-16 DIAGNOSIS — H9201 Otalgia, right ear: Secondary | ICD-10-CM

## 2015-06-16 DIAGNOSIS — L723 Sebaceous cyst: Secondary | ICD-10-CM

## 2015-06-16 DIAGNOSIS — G47419 Narcolepsy without cataplexy: Secondary | ICD-10-CM

## 2015-06-16 DIAGNOSIS — K0889 Other specified disorders of teeth and supporting structures: Secondary | ICD-10-CM

## 2015-06-16 DIAGNOSIS — F329 Major depressive disorder, single episode, unspecified: Secondary | ICD-10-CM

## 2015-06-16 DIAGNOSIS — G4730 Sleep apnea, unspecified: Secondary | ICD-10-CM | POA: Insufficient documentation

## 2015-06-16 DIAGNOSIS — F32A Depression, unspecified: Secondary | ICD-10-CM

## 2015-06-16 MED ORDER — BUPROPION HCL ER (XL) 150 MG PO TB24: 150.0000 mg | ORAL_TABLET | Freq: Every day | ORAL | Status: DC

## 2015-06-16 MED ORDER — ANTIPYRINE-BENZOCAINE 5.4-1.4 % OT SOLN: 3.0000 [drp] | OTIC | Status: DC | PRN

## 2015-06-16 NOTE — Progress Notes (Addendum)
   Subjective:    Patient ID: [name], female    DOB: [birth year], 24 y.o.   MRN: [id number] CC: wound check L axilla cyst  HPI 24 yo F with narcolepsy, sleep apnea, morbid obesity  1. Sleep apnea: not using CPAP. Having R lower molar pain and sensitivity to cold. No oral swelling. No fever.  2. Narcolepsy:  Still falling asleep quickly and in dangerous places. Previously on ritalin and wellbutrin while in high school.   3. L axilla cyast: s/p I & D with packing. No redness, severe pain or swelling.   4. R earache: pain w/o drainage or fever x 3-4 days. No water exposure  Soc Hx: non smoker  Review of Systems  Constitutional: Negative for fever and chills.  HENT: Positive for dental problem.   Respiratory: Negative for shortness of breath.   Cardiovascular: Negative for chest pain.  Gastrointestinal: Positive for abdominal pain.  Skin: Positive for wound. Negative for color change and rash.  Psychiatric/Behavioral: Positive for sleep disturbance and dysphoric mood. Negative for suicidal ideas.  GAD-7: score of 7. 1-1,4,5,7. And 3-6.     Objective:   Physical Exam BP 116/74 mmHg  Pulse 78  Temp(Src) 98.5 F (36.9 C) (Oral)  Resp 20  Ht 5\' 3"  (1.6 m)  Wt 373 lb (169.192 kg)  BMI 66.09 kg/m2  SpO2 99%  LMP 06/07/2015 Wt Readings from Last 3 Encounters:  06/16/15 373 lb (169.192 kg)  06/05/15 381 lb (172.82 kg)  05/26/15 380 lb (172.367 kg)  Head: Normocephalic, without obvious abnormality, atraumatic  Ears: normal TM's and external ear canals both ears L axilla: 5 cm of sterile packing removed from L axilla wound there is moderate light yellow discharge, no erythema or edema.     Assessment & Plan:

## 2015-06-16 NOTE — Assessment & Plan Note (Signed)
Depression: Start wellbutrin 150 mg daily [facility]   5. R ear ach: no sign of infection. Auralgan for pain. If auralgan not available tylenol or motrin recommended.

## 2015-06-16 NOTE — Assessment & Plan Note (Signed)
Weight loss:  Great job with weight loss. Ongoing weight loss is important for your sleep apnea to prevent heart and lung disease.

## 2015-06-16 NOTE — Patient Instructions (Addendum)
Ms. Pablo LawrenceClarida,  Thank you for coming in today  1. 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 L axilla cyst:  Skin looks healthy  Keep wound clean and dry   3. Weight loss:  Great job with weight loss. Ongoing weight loss is important for your sleep apnea to prevent heart and lung disease.   4. Depression: Start wellbutrin 150 mg daily [facility] [place] and [place].   5. R ear ach: no sign of infection. Auralgan for pain. If auralgan not available tylenol or motrin recommended.   F/u in 7-10 days with RN for wound check and packing removal 3-5 cm F/u with me in 3-4 weeks for depression  Dr. Armen PickupFunches

## 2015-06-16 NOTE — Assessment & Plan Note (Signed)
L axilla cyst:  Skin looks healthy  Keep wound clean and dry

## 2015-06-16 NOTE — Assessment & Plan Note (Signed)
Narcolepsy: neurology referral placed again today

## 2015-06-16 NOTE — Assessment & Plan Note (Signed)
R ear ach: no sign of infection. Auralgan for pain. If auralgan not available tylenol or motrin recommended.

## 2015-06-16 NOTE — Progress Notes (Signed)
F/U cyst under lt arm  No pain no sweelling  Complaining of rt ear ache

## 2015-06-21 ENCOUNTER — Ambulatory Visit: Payer: Self-pay | Attending: Family Medicine | Admitting: *Deleted

## 2015-06-21 ENCOUNTER — Telehealth: Payer: Self-pay | Admitting: Family Medicine

## 2015-06-21 VITALS — Temp 98.0°F

## 2015-06-21 DIAGNOSIS — L02412 Cutaneous abscess of left axilla: Secondary | ICD-10-CM | POA: Insufficient documentation

## 2015-06-21 NOTE — Telephone Encounter (Signed)
Patient came to check out window asking to be scheduled on a Tuesday for a wound check; there are no available appointments or providers for that date; please contact patient to schedule a date because she has transportation barriers

## 2015-06-21 NOTE — Progress Notes (Signed)
Patient presents for wound check of left axilla s/p I and D on 05/26/15 States hasn't changed dressing in 5 days and yesterday dressing fell off and all packing fell out  Dressing clean and dry Wound without redness or wamth Depth of wound approximated at 6 mm Patient rates pain 4/10 above incision site with palpation T 98.0  Discussed with Medical Director and sterile packing reinserted and dressing reapplied  Patient instructed to keep area clean and dry and change dressing daily  Patient to return in 5 days for wound reassessment

## 2015-06-22 NOTE — Telephone Encounter (Signed)
Please call patient and see when she can come in, ok to dbl book with a 15 minute English speaker, wound check only.

## 2015-06-26 ENCOUNTER — Ambulatory Visit: Payer: Self-pay

## 2015-06-29 ENCOUNTER — Ambulatory Visit: Payer: Self-pay

## 2015-06-29 ENCOUNTER — Telehealth: Payer: Self-pay | Admitting: Family Medicine

## 2015-06-29 NOTE — Telephone Encounter (Signed)
Pt was advised to take Tylenol per Dr Armen PickupFunches If no changes schedule an OV

## 2015-06-29 NOTE — Telephone Encounter (Signed)
Patient came into clinic requesting medication for ear pain, patient states Auralgan was suppose to be prescribed. Please f/u

## 2015-07-03 ENCOUNTER — Ambulatory Visit: Payer: Self-pay

## 2015-07-07 ENCOUNTER — Ambulatory Visit: Payer: Self-pay | Attending: Family Medicine | Admitting: Family Medicine

## 2015-07-07 ENCOUNTER — Encounter: Payer: Self-pay | Admitting: Family Medicine

## 2015-07-07 VITALS — BP 118/76 | HR 86 | Temp 98.7°F | Resp 18 | Ht 63.0 in | Wt 380.0 lb

## 2015-07-07 DIAGNOSIS — F329 Major depressive disorder, single episode, unspecified: Secondary | ICD-10-CM | POA: Insufficient documentation

## 2015-07-07 DIAGNOSIS — G47411 Narcolepsy with cataplexy: Secondary | ICD-10-CM | POA: Insufficient documentation

## 2015-07-07 DIAGNOSIS — Z6841 Body Mass Index (BMI) 40.0 and over, adult: Secondary | ICD-10-CM | POA: Insufficient documentation

## 2015-07-07 DIAGNOSIS — L723 Sebaceous cyst: Secondary | ICD-10-CM | POA: Insufficient documentation

## 2015-07-07 DIAGNOSIS — F32A Depression, unspecified: Secondary | ICD-10-CM

## 2015-07-07 MED ORDER — BUPROPION HCL ER (XL) 150 MG PO TB24: 150.0000 mg | ORAL_TABLET | Freq: Two times a day (BID) | ORAL | Status: DC

## 2015-07-07 NOTE — Assessment & Plan Note (Signed)
Chest has been removed and area is healing

## 2015-07-07 NOTE — Patient Instructions (Signed)
Ms. Ingber,  Thank you for coming in today  1. Depression is still moderate to severe Increase Wellbutrin to 150 mg twice daily, take the second dose before 6 pm  Counseling services available at [facility] [place], [place] and [place].   2. Obesity: Contributing to sleep apnea  I would like you to consider weight loss surgery as an option In order for weight loss you will need health insurance Please continue to apply for disability and medicaid, please take the letter provided to the social security office   I recommend a low carb diet   Diet Recommendations   Starchy (carb) foods include: Bread, rice, pasta, potatoes, corn, crackers, bagels, muffins, all baked goods.   Protein foods include: Meat, fish, poultry, eggs, dairy foods, and beans such as pinto and kidney beans (beans also provide carbohydrate).   1. Eat at least 3 meals and 1-2 snacks per day. Never go more than 4-5 hours while awake without eating.  2. Limit starchy foods to TWO per meal and ONE per snack. ONE portion of a starchy  food is equal to the following:   - ONE slice of bread (or its equivalent, such as half of a hamburger bun).   - 1/2 cup of a "scoopable" starchy food such as potatoes or rice.   - 1 OUNCE (28 grams) of starchy snack foods such as crackers or pretzels (look on label).   - 15 grams of carbohydrate as shown on food label.  3. Both lunch and dinner should include a protein food, a carb food, and vegetables.   - Obtain twice as many veg's as protein or carbohydrate foods for both lunch and dinner.   - Try to keep frozen veg's on hand for a quick vegetable serving.     - Fresh or frozen veg's are best.  4. Breakfast should always include protein.     F/u in 6 weeks for depression  Dr. Armen Pickup

## 2015-07-07 NOTE — Progress Notes (Signed)
   Subjective:    Patient ID: [name], female    DOB: December 16, 1991, 24 y.o.   MRN: [id number] CC: depression  HPI 24 yo F with morbid obesity and narcolepsy, uninsured presents for f/u appt  1. Depression: taking wellbutrin 150 mg once daily. Admits to depressed mood with sleep disturbance. She has a strained relationship with her mother and her younger brother and nephews age 5-14.   2. L axillary I&D: healing. packing has fallen out. No fever or chills. Induration above incision that is tender. No redness. Patient does not shave. She is using fragrant free deodorant.   3. Morbid obesity: has been obese since age 24 Gained weight steadily since then. Mom is morbidly obese as well. Has sleep apnea and narcolepsy.   Current Outpatient Prescriptions on File Prior to Visit  Medication Sig Dispense Refill  . buPROPion (WELLBUTRIN XL) 150 MG 24 hr tablet Take 1 tablet (150 mg total) by mouth daily. 30 tablet 2  . ferrous sulfate 325 (65 FE) MG tablet Take 1 tablet (325 mg total) by mouth 2 (two) times daily with a meal. 60 tablet 3  . ibuprofen (ADVIL,MOTRIN) 600 MG tablet Take 1 tablet (600 mg total) by mouth every 6 (six) hours as needed. 30 tablet 0  . Vitamin D, Ergocalciferol, (DRISDOL) 50000 UNITS CAPS capsule Take 1 capsule (50,000 Units total) by mouth every 7 (seven) days. For 12 weeks 12 capsule 0   No current facility-administered medications on file prior to visit.   Past Medical History  Diagnosis Date  . Asthma   . Exogenous obesity   . Poor sleep hygiene   . 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 Narcolepsy cataplexy syndrome   . Clotting disorder    Review of Systems  Constitutional: Negative for fever and chills.  HENT: Positive for sore throat.   Gastrointestinal: Positive for abdominal pain.  Psychiatric/Behavioral: Positive for sleep disturbance and dysphoric  mood. Negative for suicidal ideas. The patient is not nervous/anxious.    GAD-7: score of 10. 1-3,5. 2-7, 3-4,6    Objective:   Physical Exam BP 118/76 mmHg  Pulse 86  Temp(Src) 98.7 F (37.1 C) (Oral)  Resp 18  Ht  (1.6 m)  Wt 380 lb (172.367 kg)  BMI 67.33 kg/m2  SpO2 98%  LMP 06/17/2015  Wt Readings from Last 3 Encounters:  07/07/15 380 lb (172.367 kg)  06/16/15 373 lb (169.192 kg)  06/05/15 381 lb (172.82 kg)  General appearance: alert, cooperative, no distress and morbidly obese Throat:mutiple fillings in teeth, normal oropharynx  Lungs: normal WOB Skin: L axillary 1 cm linear incision superficial wound   Depression screen [facility] 2/9 07/07/2015 06/16/2015 04/13/2015  Decreased Interest 1 3 0  Down, Depressed, Hopeless 3 3 0  PHQ - 2 Score 4 6 0  Altered sleeping 3 3 -  Tired, decreased energy 2 3 -  Change in appetite 3 3 -  Feeling bad or failure about yourself  3 3 -  Trouble concentrating 2 1 -  Moving slowly or fidgety/restless 3 0 -  Suicidal thoughts 0 0 -  PHQ-9 Score 20 19 -       Assessment & Plan:

## 2015-07-07 NOTE — Assessment & Plan Note (Signed)
Obesity: Contributing to sleep apnea  I would like you to consider weight loss surgery as an option In order for weight loss you will need health insurance Please continue to apply for disability and medicaid, please take the letter provided to the social security office

## 2015-07-07 NOTE — Progress Notes (Signed)
F/U Depression Stated stomach pain and sore throat due to bleach smell at home

## 2015-07-07 NOTE — Assessment & Plan Note (Signed)
Depression is still moderate to severe Increase Wellbutrin to 150 mg twice daily, take the second dose before 6 pm  Counseling services available at [facility] [place], [place] and [place].

## 2015-07-10 ENCOUNTER — Other Ambulatory Visit: Payer: Self-pay | Admitting: Family Medicine

## 2015-07-10 MED ORDER — BUPROPION HCL ER (XL) 300 MG PO TB24: 300.0000 mg | ORAL_TABLET | Freq: Every day | ORAL | Status: DC

## 2015-08-01 ENCOUNTER — Encounter (HOSPITAL_COMMUNITY): Payer: Self-pay | Admitting: Emergency Medicine

## 2015-08-01 DIAGNOSIS — K002 Abnormalities of size and form of teeth: Secondary | ICD-10-CM | POA: Insufficient documentation

## 2015-08-01 DIAGNOSIS — Z79899 Other long term (current) drug therapy: Secondary | ICD-10-CM | POA: Insufficient documentation

## 2015-08-01 DIAGNOSIS — R51 Headache: Secondary | ICD-10-CM | POA: Insufficient documentation

## 2015-08-01 DIAGNOSIS — K029 Dental caries, unspecified: Secondary | ICD-10-CM | POA: Insufficient documentation

## 2015-08-01 DIAGNOSIS — J45909 Unspecified asthma, uncomplicated: Secondary | ICD-10-CM | POA: Insufficient documentation

## 2015-08-01 DIAGNOSIS — E669 Obesity, unspecified: Secondary | ICD-10-CM | POA: Insufficient documentation

## 2015-08-01 DIAGNOSIS — H9201 Otalgia, right ear: Secondary | ICD-10-CM | POA: Insufficient documentation

## 2015-08-01 NOTE — ED Notes (Signed)
Pt. reports right lower molar pain for 1 month with headache unrelieved by OTC Tylenol .

## 2015-08-02 ENCOUNTER — Emergency Department (HOSPITAL_COMMUNITY)
Admission: EM | Admit: 2015-08-02 | Discharge: 2015-08-02 | Disposition: A | Discharge status: Home / Self Care | Payer: Self-pay | Attending: Emergency Medicine | Admitting: Emergency Medicine

## 2015-08-02 DIAGNOSIS — K029 Dental caries, unspecified: Secondary | ICD-10-CM

## 2015-08-02 MED ORDER — NAPROXEN 500 MG PO TABS: 500.0000 mg | ORAL_TABLET | Freq: Two times a day (BID) | ORAL | Status: DC | PRN

## 2015-08-02 MED ORDER — DOXYCYCLINE HYCLATE 100 MG PO CAPS: 100.0000 mg | ORAL_CAPSULE | Freq: Two times a day (BID) | ORAL | Status: DC

## 2015-08-02 MED ORDER — HYDROCODONE-ACETAMINOPHEN 5-325 MG PO TABS
1.0000 | ORAL_TABLET | Freq: Once | ORAL | Status: AC
  Administered 2015-08-02: 1 via ORAL
  Filled 2015-08-02: qty 1

## 2015-08-02 MED ORDER — HYDROCODONE-ACETAMINOPHEN 5-325 MG PO TABS: 1.0000 | ORAL_TABLET | Freq: Four times a day (QID) | ORAL | Status: DC | PRN

## 2015-08-02 NOTE — ED Provider Notes (Signed)
CSN: [id number]     Arrival date & time 08/01/15  2307 History   First MD Initiated Contact with Patient 08/02/15 0038     Chief Complaint  Patient presents with  . Dental Pain     (Consider location/radiation/quality/duration/timing/severity/associated sxs/prior Treatment) HPI Comments: [name] is a 24 y.o. female who presents to the ED with complaints of right lower molar pain 1 month gradually worsening over one week. She reports pain is 9/10 constant throbbing right lower molar pain which radiates to the right ear, worse with cold exposure, and unrelieved with naproxen and Excedrin. She reports that this is causing her to have mild intermittent headaches which are similar to her prior headaches. She denies any ear drainage, trismus, drooling, difficulty swallowing, facial or neck swelling, gum swelling or drainage, fevers, chills, chest pain, shortness of breath, rhinorrhea, sore throat, abdominal pain, nausea vomiting, numbness, tingling, weakness, vision changes, lightheadedness or dizziness. She has no dentist. She is a nonsmoker. She believes one of her fillings broke.   Patient is a 24 y.o. female presenting with tooth pain. The history is provided by the patient. No language interpreter was used.  Dental Pain Location:  Lower Lower teeth location:  31/RL 2nd molar Quality:  Throbbing Severity:  Moderate Onset quality:  Gradual Duration:  1 month Timing:  Constant Progression:  Worsening Chronicity:  Recurrent Context: dental caries and poor dentition   Relieved by:  Nothing Worsened by:  Cold food/drink Ineffective treatments:  NSAIDs Associated symptoms: headaches (mild, from the pain)   Associated symptoms: no difficulty swallowing, no drooling, no facial pain, no facial swelling, no fever, no gum swelling, no neck pain, no neck swelling, no oral bleeding, no oral lesions and no trismus   Risk factors: lack of dental care   Risk factors: no smoking     Past  Medical History  Diagnosis Date  . Asthma   . Exogenous obesity   . Poor sleep hygiene   . 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 Narcolepsy cataplexy syndrome 2007    Prior sleep studies starting on September 26 2005, AHI 5.3 per hour,  . Clotting disorder    Past Surgical History  Procedure Laterality Date  . None     Family History  Problem Relation Age of Onset  . Hypertension Mother   . Asthma Mother   . Alcohol abuse Father   . Asthma Brother   . Alcohol abuse Maternal Grandmother    Social History  Substance Use Topics  . Smoking status: Never Smoker   . Smokeless tobacco: None  . Alcohol Use: Yes     Comment: ocassional   OB History    No data available     Review of Systems  Constitutional: Negative for fever and chills.  HENT: Positive for ear pain (radiating to R ear). Negative for drooling, ear discharge, facial swelling, mouth sores, rhinorrhea, sore throat and trouble swallowing.   Eyes: Negative for visual disturbance.  Respiratory: Negative for shortness of breath.   Cardiovascular: Negative for chest pain.  Gastrointestinal: Negative for nausea, vomiting and abdominal pain.  Musculoskeletal: Negative for myalgias, arthralgias and neck pain.  Skin: Negative for color change.  Neurological: Positive for headaches (mild, from the pain). Negative for dizziness, weakness, light-headedness and numbness.  Psychiatric/Behavioral: Negative for confusion.   10 Systems reviewed and are negative for acute change except as noted  in the HPI.    Allergies  Banana  Home Medications   Prior to Admission medications   Medication Sig Start Date End Date Taking? Authorizing Provider  buPROPion (WELLBUTRIN XL) 300 MG 24 hr tablet Take 1 tablet (300 mg total) by mouth daily. 07/10/15   Dessa Phi, MD  ferrous sulfate 325 (65 FE) MG tablet Take 1 tablet (325 mg total) by  mouth 2 (two) times daily with a meal. 04/13/15   Josalyn Funches, MD  ibuprofen (ADVIL,MOTRIN) 600 MG tablet Take 1 tablet (600 mg total) by mouth every 6 (six) hours as needed. 05/26/15   Josalyn Funches, MD  Vitamin D, Ergocalciferol, (DRISDOL) 50000 UNITS CAPS capsule Take 1 capsule (50,000 Units total) by mouth every 7 (seven) days. For 12 weeks 04/13/15   Josalyn Funches, MD   BP 115/56 mmHg  Pulse 99  Temp(Src) 98.5 F (36.9 C) (Oral)  Resp 18  SpO2 98%  LMP 06/17/2015 Physical Exam  Constitutional: She is oriented to person, place, and time. Vital signs are normal. She appears well-developed and well-nourished.  Non-toxic appearance. No distress.  Afebrile, nontoxic, NAD  HENT:  Head: Normocephalic and atraumatic.  Right Ear: Hearing, tympanic membrane, external ear and ear canal normal.  Left Ear: Hearing, tympanic membrane, external ear and ear canal normal.  Nose: Nose normal.  Mouth/Throat: Uvula is midline, oropharynx is clear and moist and mucous membranes are normal. No trismus in the jaw. Dental caries present. No dental abscesses or uvula swelling.    Ears are clear bilaterally. Nose clear. R lower molar #31 decayed with caries noted around a filling, with TTP, diffuse caries and fillings in multiple teeth, no gingival swelling or erythema, no dental abscess noted. Oropharynx clear and moist, without uvular swelling or deviation, no trismus or drooling, no tonsillar swelling or erythema, no exudates.    Eyes: Conjunctivae and EOM are normal. Right eye exhibits no discharge. Left eye exhibits no discharge.  Neck: Normal range of motion. Neck supple.  Cardiovascular: Normal rate.   Pulmonary/Chest: Effort normal. No respiratory distress.  Abdominal: Normal appearance. She exhibits no distension.  Musculoskeletal: Normal range of motion.  Lymphadenopathy:       Head (right side): No submandibular and no tonsillar adenopathy present.       Head (left side): No submandibular  and no tonsillar adenopathy present.    She has no cervical adenopathy.  Neurological: She is alert and oriented to person, place, and time. She has normal strength. No sensory deficit.  Skin: Skin is warm, dry and intact. No rash noted.  Psychiatric: She has a normal mood and affect. Her behavior is normal.  Nursing note and vitals reviewed.   ED Course  Procedures (including critical care time) Labs Review Labs Reviewed - No data to display  Imaging Review No results found. I have personally reviewed and evaluated these images and lab results as part of my medical decision-making.   EKG Interpretation None      MDM   Final diagnoses:  Pain due to dental caries  Dental decay    24 y.o. female here with Dental pain associated with dental decay and possible dental infection with patient afebrile, non toxic appearing and swallowing secretions well. I gave patient referral to dentist and stressed the importance of dental follow up for ultimate management of dental pain.  I have also discussed reasons to return immediately to the ER.  Patient expresses understanding and agrees with plan.  I will also give doxycycline  and pain control.    BP 115/56 mmHg  Pulse 99  Temp(Src) 98.5 F (36.9 C) (Oral)  Resp 18  SpO2 98%  LMP 06/17/2015  Meds ordered this encounter  Medications  . HYDROcodone-acetaminophen (NORCO/VICODIN) 5-325 MG per tablet 1 tablet    Sig:   . doxycycline (VIBRAMYCIN) 100 MG capsule    Sig: Take 1 capsule (100 mg total) by mouth 2 (two) times daily. One po bid x 7 days    Dispense:  14 capsule    Refill:  0  . HYDROcodone-acetaminophen (NORCO) 5-325 MG per tablet    Sig: Take 1 tablet by mouth every 6 (six) hours as needed for severe pain.    Dispense:  10 tablet    Refill:  0  . naproxen (NAPROSYN) 500 MG tablet    Sig: Take 1 tablet (500 mg total) by mouth 2 (two) times daily as needed for mild pain, moderate pain or headache (TAKE WITH MEALS.).     Dispense:  20 tablet    Refill:  0      Pooja Camuso Camprubi-Soms, PA-C 08/02/15 0145  Derwood Kaplan, MD 08/03/15 1610

## 2015-08-02 NOTE — Discharge Instructions (Signed)
Apply warm compresses to jaw throughout the day. Take antibiotic until finished. Take naprosyn and norco as directed, as needed for pain but do not drive or operate machinery with pain medication use. Followup with a dentist is very important for ongoing evaluation and management of recurrent dental pain. Call the dentist listed above in the next 24-48 hours, or use the list below to find a dentist. Return to emergency department for emergent changing or worsening symptoms.   Dental Pain Toothache is pain in or around a tooth. It may get worse with chewing or with cold or heat.  HOME CARE  Your dentist may use a numbing medicine during treatment. If so, you may need to avoid eating until the medicine wears off. Ask your dentist about this.  Only take medicine as told by your dentist or doctor.  Avoid chewing food near the painful tooth until after all treatment is done. Ask your dentist about this. GET HELP RIGHT AWAY IF:   The problem gets worse or new problems appear.  You have a fever.  There is redness and puffiness (swelling) of the face, jaw, or neck.  You cannot open your mouth.  There is pain in the jaw.  There is very bad pain that is not helped by medicine. MAKE SURE YOU:   Understand these instructions.  Will watch your condition.  Will get help right away if you are not doing well or get worse. Document Released: 05/20/2008 Document Revised: 02/24/2012 Document Reviewed: 05/20/2008 [facility] Patient Information 2015 [place], [place]. This information is not intended to replace advice given to you by your health care provider. Make sure you discuss any questions you have with your health care provider.  Dental Caries Dental caries is tooth decay. This decay can cause a hole in teeth (cavity) that can get bigger and deeper over time. HOME CARE  Brush and floss your teeth. Do this at least two times a day.  Use a fluoride toothpaste.  Use a mouth rinse if told by  your dentist or doctor.  Eat less sugary and starchy foods. Drink less sugary drinks.  Avoid snacking often on sugary and starchy foods. Avoid sipping often on sugary drinks.  Keep regular checkups and cleanings with your dentist.  Use fluoride supplements if told by your dentist or doctor.  Allow fluoride to be applied to teeth if told by your dentist or doctor. Document Released: 09/10/2008 Document Revised: 04/18/2014 Document Reviewed: 12/04/2012 [facility] Patient Information 2015 [place], [place]. This information is not intended to replace advice given to you by your health care provider. Make sure you discuss any questions you have with your health care provider.   Emergency Department Resource Guide 1) Find a Doctor and Pay Out of Pocket Although you won't have to find out who is covered by your insurance plan, it is a good idea to ask around and get recommendations. You will then need to call the office and see if the doctor you have chosen will accept you as a new patient and what types of options they offer for patients who are self-pay. Some doctors offer discounts or will set up payment plans for their patients who do not have insurance, but you will need to ask so you aren't surprised when you get to your appointment.  2) Contact Your Local Health Department Not all health departments have doctors that can see patients for sick visits, but many do, so it is worth a call to see if yours does. If  you don't know where your local health department is, you can check in your phone book. The CDC also has a tool to help you locate your state's health department, and many state websites also have listings of all of their local health departments.  3) Find a Walk-in Clinic If your illness is not likely to be very severe or complicated, you may want to try a walk in clinic. These are popping up all over the country in pharmacies, drugstores, and shopping centers. They're usually staffed by  nurse practitioners or physician assistants that have been trained to treat common illnesses and complaints. They're usually fairly quick and inexpensive. However, if you have serious medical issues or chronic medical problems, these are probably not your best option.  No Primary Care Doctor: - Call Health Connect at  [phone] - they can help you locate a primary care doctor that  accepts your insurance, provides certain services, etc. - Physician Referral Service- [phone]  Chronic Pain Problems: Organization         Address  Phone   Notes  [facility]  [phone] Patients need to be referred by their primary care doctor.   Medication Assistance: Organization         Address  Phone   Notes  [facility] Medication [facility] [address] [place], [place] [address] [phone] --Must be a resident of [facility] -- Must have NO insurance coverage whatsoever (no Medicaid/ Medicare, etc.) -- The pt. MUST have a primary care doctor that directs their care regularly and follows them in the community   [facility]  [phone]   [place]  [phone]     Dental Care: Organization         Address  Phone  Notes  [facility] Department of [facility] [address] [place], [place] [phone] Accepts children up to age 73 who are enrolled in [place] or [place] Health Choice; pregnant women with a Medicaid card; and children who have applied for Medicaid or [place] Health Choice, but were declined, whose parents can pay a reduced fee at time of service.  [facility] Department of [facility]  [address], [place] [phone] Accepts children up to age 32 who are enrolled in [place] or [place] Health Choice; pregnant women with a Medicaid card; and children who have applied for Medicaid or [place] Health Choice, but were declined, whose parents can pay a reduced fee at time of service.   [facility]  [address] [place], [place] [phone] Patients are seen by appointment only. Walk-ins are not accepted. [facility] will see patients 48 years of age and older. Monday - Tuesday (8am-5pm) Most Wednesdays (8:30-5pm) $30 per visit, cash only  [facility]  [address], [facility] [phone] Patients are seen by appointment only. Walk-ins are not accepted. [facility] will see patients 38 years of age and older. One Wednesday Evening (Monthly: Volunteer Based).  $30 per visit, cash only  [employer]  [phone] for adults; Children under age 16, call Graduate Pediatric Dentistry at [phone]. Children aged 67-14, please call [phone] to request a pediatric application.  Dental services are provided in all areas of dental care including fillings, crowns and bridges, complete and partial dentures, implants, gum treatment, root canals, and extractions. Preventive care is also provided. Treatment  is provided to both adults and children. Patients are selected via a lottery and there is often a waiting list.   [facility] [address] [place]  [phone] [email or web address]   [facility] [address] [place], [place] [phone], Ext. [phone] Second and Fourth Thursday of each month, opens at 6:30 AM; Clinic ends at 9 AM.  Patients are seen on a first-come first-served basis, and a limited number are seen during each clinic.   [facility]  [address] [place], [place] [phone]   Eligibility Requirements You must have lived in [place], [place], or [place] counties for at least the last three months.   You cannot be eligible for state or federal sponsored [place], including [employer], [place], or [employer].   You generally cannot be eligible for healthcare insurance through your employer.    How  to apply: Eligibility screenings are held every Tuesday and Wednesday afternoon from 1:00 pm until 4:00 pm. You do not need an appointment for the interview!  [facility] [address], [place], [place] [phone]   [facility] Health Department  [phone]   [facility] Health Department  [phone]   [facility] Health Department  [phone]

## 2015-09-08 ENCOUNTER — Encounter: Payer: Self-pay | Admitting: Family Medicine

## 2015-09-08 ENCOUNTER — Ambulatory Visit: Payer: Self-pay | Attending: Family Medicine | Admitting: Family Medicine

## 2015-09-08 VITALS — BP 120/70 | HR 81 | Temp 98.2°F | Resp 18 | Ht 63.0 in | Wt 375.0 lb

## 2015-09-08 DIAGNOSIS — G44219 Episodic tension-type headache, not intractable: Secondary | ICD-10-CM | POA: Insufficient documentation

## 2015-09-08 DIAGNOSIS — R51 Headache: Secondary | ICD-10-CM

## 2015-09-08 DIAGNOSIS — L723 Sebaceous cyst: Secondary | ICD-10-CM | POA: Insufficient documentation

## 2015-09-08 DIAGNOSIS — K088 Other specified disorders of teeth and supporting structures: Secondary | ICD-10-CM | POA: Insufficient documentation

## 2015-09-08 DIAGNOSIS — G47419 Narcolepsy without cataplexy: Secondary | ICD-10-CM | POA: Insufficient documentation

## 2015-09-08 DIAGNOSIS — R519 Headache, unspecified: Secondary | ICD-10-CM | POA: Insufficient documentation

## 2015-09-08 DIAGNOSIS — Z23 Encounter for immunization: Secondary | ICD-10-CM

## 2015-09-08 DIAGNOSIS — K0889 Other specified disorders of teeth and supporting structures: Secondary | ICD-10-CM

## 2015-09-08 MED ORDER — IBUPROFEN 800 MG PO TABS
800.0000 mg | ORAL_TABLET | Freq: Three times a day (TID) | ORAL | Status: DC | PRN
Start: 2015-09-08 — End: 2015-11-17

## 2015-09-08 NOTE — Progress Notes (Signed)
C/C HA  On and off for a long time  Pain scale #7 Dental referral

## 2015-09-08 NOTE — Patient Instructions (Addendum)
  Mcgivern,  Thank you for coming in today.  [name] was seen today for referral and headache.  Diagnoses and all orders for this visit:  Pain of molar -     Ambulatory referral to Dentistry  Sebaceous cyst of left axilla -     Ambulatory referral to Dermatology  Other orders -     Flu Vaccine QUAD 36+ mos IM   I will work with social work to get you a new CPAP mask    Dr. Armen Pickup  i

## 2015-09-08 NOTE — Progress Notes (Signed)
Patient ID: [name], female   DOB: 1991-10-19, 24 y.o.   MRN: [id number]   Subjective:  Patient ID: [name], female    DOB: [birth year]  Age: 24 y.o. MRN: [id number]  CC: Referral and Headache   HPI [name] presents for dental referral, headache  1. Dental referral: requesting referral for cavities. No oral pain or swelling.   2. Headache: intermittent HA. Has OSA associated with narcolepsy. CPAP mask has a tear and a leak.   Social History  Substance Use Topics  . Smoking status: Never Smoker   . Smokeless tobacco: Not on file  . Alcohol Use: Yes     Comment: ocassional    Outpatient Prescriptions Prior to Visit  Medication Sig Dispense Refill  . buPROPion (WELLBUTRIN XL) 300 MG 24 hr tablet Take 1 tablet (300 mg total) by mouth daily. 30 tablet 3  . doxycycline (VIBRAMYCIN) 100 MG capsule Take 1 capsule (100 mg total) by mouth 2 (two) times daily. One po bid x 7 days (Patient not taking: Reported on 09/08/2015) 14 capsule 0  . ferrous sulfate 325 (65 FE) MG tablet Take 1 tablet (325 mg total) by mouth 2 (two) times daily with a meal. (Patient not taking: Reported on 09/08/2015) 60 tablet 3  . HYDROcodone-acetaminophen (NORCO) 5-325 MG per tablet Take 1 tablet by mouth every 6 (six) hours as needed for severe pain. (Patient not taking: Reported on 09/08/2015) 10 tablet 0  . ibuprofen (ADVIL,MOTRIN) 600 MG tablet Take 1 tablet (600 mg total) by mouth every 6 (six) hours as needed. (Patient not taking: Reported on 09/08/2015) 30 tablet 0  . naproxen (NAPROSYN) 500 MG tablet Take 1 tablet (500 mg total) by mouth 2 (two) times daily as needed for mild pain, moderate pain or headache (TAKE WITH MEALS.). (Patient not taking: Reported on 09/08/2015) 20 tablet 0  . Vitamin D, Ergocalciferol, (DRISDOL) 50000 UNITS CAPS capsule Take 1 capsule (50,000 Units total) by mouth every 7 (seven) days. For 12 weeks (Patient not taking: Reported on 09/08/2015) 12 capsule 0    No facility-administered medications prior to visit.    ROS Review of Systems  Constitutional: Negative for fever and chills.  HENT: Positive for dental problem.   Eyes: Negative for visual disturbance.  Respiratory: Negative for shortness of breath.   Cardiovascular: Negative for chest pain.  Gastrointestinal: Negative for abdominal pain and blood in stool.  Musculoskeletal: Negative for back pain.  Skin: Positive for wound. Negative for rash.  Allergic/Immunologic: Negative for immunocompromised state.  Neurological: Positive for headaches.  Hematological: Negative for adenopathy. Does not bruise/bleed easily.  Psychiatric/Behavioral: Positive for sleep disturbance. Negative for suicidal ideas and dysphoric mood.    Objective:  BP 120/70 mmHg  Pulse 81  Temp(Src) 98.2 F (36.8 C) (Oral)  Resp 18  Ht  (1.6 m)  Wt 375 lb (170.099 kg)  BMI 66.44 kg/m2  SpO2 100%  LMP 08/22/2015  BP/Weight 09/08/2015 08/02/2015 07/07/2015  Systolic BP 120 120 118  Diastolic BP 70 68 76  Wt. (Lbs) 375 - 380  BMI 66.44 - 67.33   Physical Exam  Constitutional: She is oriented to person, place, and time. She appears well-developed and well-nourished. No distress.  Morbidly obese   HENT:  Head: Normocephalic and atraumatic.  Mouth/Throat: Abnormal dentition. Dental caries present.  Cardiovascular: Intact distal pulses.   Pulmonary/Chest: Effort normal.  Musculoskeletal: She exhibits no edema.  Neurological: She is alert and oriented to person, place, and time.  Skin: Skin is warm and dry. No rash noted.  Mild induration with tenderness under L axilla   Psychiatric: She has a normal mood and affect.    Assessment & Plan:   Problem List Items Addressed This Visit    Headache   Relevant Medications   ibuprofen (ADVIL,MOTRIN) 800 MG tablet   Pain of molar - Primary   Relevant Orders   Ambulatory referral to Dentistry   Sebaceous cyst of left axilla   Relevant Orders    Ambulatory referral to Dermatology      No orders of the defined types were placed in this encounter.    Follow-up: No Follow-up on file.   Dessa Phi MD

## 2015-10-26 ENCOUNTER — Ambulatory Visit: Payer: Self-pay | Admitting: Family Medicine

## 2015-11-17 ENCOUNTER — Ambulatory Visit: Payer: Self-pay | Attending: Family Medicine | Admitting: Family Medicine

## 2015-11-17 ENCOUNTER — Encounter: Payer: Self-pay | Admitting: Family Medicine

## 2015-11-17 ENCOUNTER — Telehealth: Payer: Self-pay

## 2015-11-17 VITALS — BP 124/79 | HR 96 | Temp 98.6°F | Resp 16 | Ht 63.0 in | Wt 399.0 lb

## 2015-11-17 DIAGNOSIS — R51 Headache: Secondary | ICD-10-CM | POA: Insufficient documentation

## 2015-11-17 DIAGNOSIS — Z6841 Body Mass Index (BMI) 40.0 and over, adult: Secondary | ICD-10-CM

## 2015-11-17 DIAGNOSIS — M7989 Other specified soft tissue disorders: Secondary | ICD-10-CM

## 2015-11-17 DIAGNOSIS — D509 Iron deficiency anemia, unspecified: Secondary | ICD-10-CM

## 2015-11-17 DIAGNOSIS — G44219 Episodic tension-type headache, not intractable: Secondary | ICD-10-CM

## 2015-11-17 DIAGNOSIS — G47419 Narcolepsy without cataplexy: Secondary | ICD-10-CM | POA: Insufficient documentation

## 2015-11-17 DIAGNOSIS — Z79899 Other long term (current) drug therapy: Secondary | ICD-10-CM | POA: Insufficient documentation

## 2015-11-17 DIAGNOSIS — F329 Major depressive disorder, single episode, unspecified: Secondary | ICD-10-CM

## 2015-11-17 DIAGNOSIS — J029 Acute pharyngitis, unspecified: Secondary | ICD-10-CM

## 2015-11-17 DIAGNOSIS — F32A Depression, unspecified: Secondary | ICD-10-CM

## 2015-11-17 LAB — IRON AND TIBC
%SAT: 3 % — AB (ref 11–50)
IRON: 12 ug/dL — AB (ref 40–190)
TIBC: 378 ug/dL (ref 250–450)
UIBC: 366 ug/dL (ref 125–400)

## 2015-11-17 LAB — CBC
HCT: 25.2 % — ABNORMAL LOW (ref 36.0–46.0)
Hemoglobin: 7 g/dL — ABNORMAL LOW (ref 12.0–15.0)
MCH: 15.3 pg — AB (ref 26.0–34.0)
MCHC: 27.8 g/dL — AB (ref 30.0–36.0)
MCV: 54.9 fL — ABNORMAL LOW (ref 78.0–100.0)
MPV: 8.4 fL — ABNORMAL LOW (ref 8.6–12.4)
Platelets: 413 10*3/uL — ABNORMAL HIGH (ref 150–400)
RBC: 4.59 MIL/uL (ref 3.87–5.11)
RDW: 20.9 % — AB (ref 11.5–15.5)
WBC: 7.5 10*3/uL (ref 4.0–10.5)

## 2015-11-17 LAB — POCT RAPID STREP A (OFFICE): RAPID STREP A SCREEN: NEGATIVE

## 2015-11-17 LAB — FERRITIN: FERRITIN: 7 ng/mL — AB (ref 10–291)

## 2015-11-17 MED ORDER — BUPROPION HCL ER (XL) 300 MG PO TB24: 300.0000 mg | ORAL_TABLET | Freq: Every day | ORAL | Status: DC

## 2015-11-17 MED ORDER — MEDICAL COMPRESSION STOCKINGS MISC: Status: DC

## 2015-11-17 MED ORDER — IBUPROFEN 800 MG PO TABS: 800.0000 mg | ORAL_TABLET | Freq: Three times a day (TID) | ORAL | Status: DC | PRN

## 2015-11-17 MED ORDER — FERROUS SULFATE 325 (65 FE) MG PO TABS
325.0000 mg | ORAL_TABLET | Freq: Two times a day (BID) | ORAL | Status: DC
Start: 2015-11-17 — End: 2016-01-22

## 2015-11-17 NOTE — Progress Notes (Signed)
C/C legs swelling x 2 weeks  Sore throat x 2 day No pain today  No tobacco user  No suicide thought in the past two weeks

## 2015-11-17 NOTE — Progress Notes (Signed)
Subjective:  Patient ID: [name], female    DOB: 1991-06-26  Age: 24 y.o. MRN: [id number]  CC: Knee Pain   HPI [name] presents for   1. Leg swelling: x 2 weeks. Mostly in feet. She is gaining weight. She is having more trouble walking. She has known anemia. She is not taking iron.   2. Sore throat: x 2 days. Moderate. No known sick contact. No fever or chills. She has had strep before. This does not feel like strep per patient.   3. Morbid obesity: gaining weight. Has sleep apnea, has narcolepsy. Having difficulty with ambulation. Feels 2-3 weeks ago. Injured R knee. She is uninsured. If she has insurance she would be interested in weight loss surgery.   Social History  Substance Use Topics  . Smoking status: Never Smoker   . Smokeless tobacco: Not on file  . Alcohol Use: Yes     Comment: ocassional    Outpatient Prescriptions Prior to Visit  Medication Sig Dispense Refill  . buPROPion (WELLBUTRIN XL) 300 MG 24 hr tablet Take 1 tablet (300 mg total) by mouth daily. 30 tablet 3  . ibuprofen (ADVIL,MOTRIN) 800 MG tablet Take 1 tablet (800 mg total) by mouth every 8 (eight) hours as needed. 30 tablet 0   No facility-administered medications prior to visit.    ROS Review of Systems  Constitutional: Negative for fever and chills.  HENT: Positive for sore throat.   Eyes: Negative for visual disturbance.  Respiratory: Negative for shortness of breath.   Cardiovascular: Positive for leg swelling. Negative for chest pain.  Gastrointestinal: Negative for abdominal pain and blood in stool.  Musculoskeletal: Negative for back pain and arthralgias.  Skin: Negative for rash.  Allergic/Immunologic: Negative for immunocompromised state.  Hematological: Negative for adenopathy. Does not bruise/bleed easily.  Psychiatric/Behavioral: Positive for dysphoric mood. Negative for suicidal ideas.    Objective:  BP 124/79 mmHg  Pulse 96  Temp(Src) 98.6 F (37 C)  (Oral)  Resp 16  Ht 5\' 3"  (1.6 m)  Wt 399 lb (180.985 kg)  BMI 70.70 kg/m2  SpO2 100%  LMP 10/31/2015  BP/Weight 11/17/2015 09/08/2015 08/02/2015  Systolic BP 124 120 120  Diastolic BP 79 70 68  Wt. (Lbs) 399 375 -  BMI 70.7 66.44 -    Physical Exam  Constitutional: She is oriented to person, place, and time. She appears well-developed and well-nourished. No distress.  Morbid obesity   HENT:  Head: Normocephalic and atraumatic.  Right Ear: Tympanic membrane, external ear and ear canal normal.  Left Ear: Tympanic membrane, external ear and ear canal normal.  Nose: Mucosal edema present.  Mouth/Throat: Oropharynx is clear and moist.  Cardiovascular: Normal rate, regular rhythm, normal heart sounds and intact distal pulses.   Pulmonary/Chest: Effort normal and breath sounds normal.  Musculoskeletal: She exhibits edema (in feet and lower legs ).  Neurological: She is alert and oriented to person, place, and time.  Skin: Skin is warm and dry. No rash noted.  Psychiatric: She has a normal mood and affect.   Assessment & Plan:   Problem List Items Addressed This Visit    Depression (Chronic)   Relevant Medications   buPROPion (WELLBUTRIN XL) 300 MG 24 hr tablet   Headache   Relevant Medications   buPROPion (WELLBUTRIN XL) 300 MG 24 hr tablet   ibuprofen (ADVIL,MOTRIN) 800 MG tablet   Leg swelling    Leg swelling: I suspect this is due to anemia, checking levels  today. Start iron. Elevate legs at rest. Compress legs. Low salt diet.      Relevant Medications   Elastic Bandages & Supports (MEDICAL COMPRESSION STOCKINGS) MISC   Microcytic anemia (Chronic)    Lab today per orders        Relevant Medications   ferrous sulfate 325 (65 FE) MG tablet   Other Relevant Orders   CBC   Iron and TIBC   Ferritin   Morbid obesity with body mass index of 70 and over in adult [facility]) (Chronic)    A: gaining weight and losing mobility P: Nutrition referral       Relevant Orders    Amb ref to Medical Nutrition Therapy-MNT   Sore throat - Primary    Strep is negative. Sore throat is likely viral. Use cough drops, honey and ibuprofen for the pain        Relevant Orders   POCT rapid strep A (Completed)      No orders of the defined types were placed in this encounter.    Follow-up: No Follow-up on file.   Dessa Phi MD

## 2015-11-17 NOTE — Assessment & Plan Note (Signed)
A: gaining weight and losing mobility P: Nutrition referral

## 2015-11-17 NOTE — Assessment & Plan Note (Signed)
Lab today per orders

## 2015-11-17 NOTE — Assessment & Plan Note (Signed)
Strep is negative. Sore throat is likely viral. Use cough drops, honey and ibuprofen for the pain

## 2015-11-17 NOTE — Assessment & Plan Note (Signed)
Leg swelling: I suspect this is due to anemia, checking levels today. Start iron. Elevate legs at rest. Compress legs. Low salt diet.

## 2015-11-17 NOTE — Assessment & Plan Note (Signed)
>>  ASSESSMENT AND PLAN FOR IRON  DEFICIENCY ANEMIA WRITTEN ON 11/17/2015 12:57 PM BY FUNCHES, JOSALYN, MD  Lab today per orders

## 2015-11-17 NOTE — Telephone Encounter (Signed)
Patient called nurse, left message on voicemail reporting swelling in legs. Nurse called patient, patient verified date of birth. Patient reports being on her way to see Dr. Armen PickupFunches at this time for leg swelling.

## 2015-11-17 NOTE — Patient Instructions (Addendum)
[name] was seen today for knee pain and osteoarthritis.  Diagnoses and all orders for this visit:  Sore throat -     POCT rapid strep A  Depression -     buPROPion (WELLBUTRIN XL) 300 MG 24 hr tablet; Take 1 tablet (300 mg total) by mouth daily.  Microcytic anemia -     CBC -     Iron and TIBC -     Ferritin -     ferrous sulfate 325 (65 FE) MG tablet; Take 1 tablet (325 mg total) by mouth 2 (two) times daily with a meal.  Episodic tension-type headache, not intractable -     ibuprofen (ADVIL,MOTRIN) 800 MG tablet; Take 1 tablet (800 mg total) by mouth every 8 (eight) hours as needed.   Strep is negative. Sore throat is likely viral. Use cough drops, honey and ibuprofen for the pain  Leg swelling: I suspect this is due to anemia, checking levels today. Start iron. Elevate legs at rest. Compress legs. Low salt diet.   ApartmentMom.com.eehttp://www.sleepapnea.org/resources/cpap-assistance-program.html, check this website for $25 sleep apnea mask   F/u in 6 weeks for depression and anemia  Dr. Armen PickupFunches   Low-Sodium Eating Plan Sodium raises blood pressure and causes water to be held in the body. Getting less sodium from food will help lower your blood pressure, reduce any swelling, and protect your heart, liver, and kidneys. We get sodium by adding salt (sodium chloride) to food. Most of our sodium comes from canned, boxed, and frozen foods. Restaurant foods, fast foods, and pizza are also very high in sodium. Even if you take medicine to lower your blood pressure or to reduce fluid in your body, getting less sodium from your food is important. WHAT IS MY PLAN? Most people should limit their sodium intake to 2,300 mg a day. Your health care provider recommends that you limit your sodium intake to __________ a day.  WHAT DO I NEED TO KNOW ABOUT THIS EATING PLAN? For the low-sodium eating plan, you will follow these general guidelines:  Choose foods with a % Daily Value for sodium of less than 5% (as  listed on the food label).   Use salt-free seasonings or herbs instead of table salt or sea salt.   Check with your health care provider or pharmacist before using salt substitutes.   Eat fresh foods.  Eat more vegetables and fruits.  Limit canned vegetables. If you do use them, rinse them well to decrease the sodium.   Limit cheese to 1 oz (28 g) per day.   Eat lower-sodium products, often labeled as "lower sodium" or "no salt added."  Avoid foods that contain monosodium glutamate (MSG). MSG is sometimes added to [place] food and some canned foods.  Check food labels (Nutrition Facts labels) on foods to learn how much sodium is in one serving.  Eat more home-cooked food and less restaurant, buffet, and fast food.  When eating at a restaurant, ask that your food be prepared with less salt, or no salt if possible.  HOW DO I READ FOOD LABELS FOR SODIUM INFORMATION? The Nutrition Facts label lists the amount of sodium in one serving of the food. If you eat more than one serving, you must multiply the listed amount of sodium by the number of servings. Food labels may also identify foods as:  Sodium free--Less than 5 mg in a serving.  Very low sodium--35 mg or less in a serving.  Low sodium--140 mg or less in a serving.  Light in sodium--50% less sodium in a serving. For example, if a food that usually has 300 mg of sodium is changed to become light in sodium, it will have 150 mg of sodium.  Reduced sodium--25% less sodium in a serving. For example, if a food that usually has 400 mg of sodium is changed to reduced sodium, it will have 300 mg of sodium. WHAT FOODS CAN I EAT? Grains Low-sodium cereals, including oats, puffed wheat and rice, and shredded wheat cereals. Low-sodium crackers. Unsalted rice and pasta. Lower-sodium bread.  Vegetables Frozen or fresh vegetables. Low-sodium or reduced-sodium canned vegetables. Low-sodium or reduced-sodium tomato sauce and paste.  Low-sodium or reduced-sodium tomato and vegetable juices.  Fruits Fresh, frozen, and canned fruit. Fruit juice.  Meat and Other Protein Products Low-sodium canned tuna and salmon. Fresh or frozen meat, poultry, seafood, and fish. Lamb. Unsalted nuts. Dried beans, peas, and lentils without added salt. Unsalted canned beans. Homemade soups without salt. Eggs.  Dairy Milk. Soy milk. Ricotta cheese. Low-sodium or reduced-sodium cheeses. Yogurt.  Condiments Fresh and dried herbs and spices. Salt-free seasonings. Onion and garlic powders. Low-sodium varieties of mustard and ketchup. Fresh or refrigerated horseradish. Lemon juice.  Fats and Oils Reduced-sodium salad dressings. Unsalted butter.  Other Unsalted popcorn and pretzels.  The items listed above may not be a complete list of recommended foods or beverages. Contact your dietitian for more options. WHAT FOODS ARE NOT RECOMMENDED? Grains Instant hot cereals. Bread stuffing, pancake, and biscuit mixes. Croutons. Seasoned rice or pasta mixes. Noodle soup cups. Boxed or frozen macaroni and cheese. Self-rising flour. Regular salted crackers. Vegetables Regular canned vegetables. Regular canned tomato sauce and paste. Regular tomato and vegetable juices. Frozen vegetables in sauces. Salted [place] fries. Olives. Rosita Fire. Relishes. Sauerkraut. Salsa. Meat and Other Protein Products Salted, canned, smoked, spiced, or pickled meats, seafood, or fish. Bacon, ham, sausage, hot dogs, corned beef, chipped beef, and packaged luncheon meats. Salt pork. Jerky. Pickled herring. Anchovies, regular canned tuna, and sardines. Salted nuts. Dairy Processed cheese and cheese spreads. Cheese curds. Blue cheese and cottage cheese. Buttermilk.  Condiments Onion and garlic salt, seasoned salt, table salt, and sea salt. Canned and packaged gravies. Worcestershire sauce. Tartar sauce. Barbecue sauce. Teriyaki sauce. Soy sauce, including reduced sodium. Steak  sauce. Fish sauce. Oyster sauce. Cocktail sauce. Horseradish that you find on the shelf. Regular ketchup and mustard. Meat flavorings and tenderizers. Bouillon cubes. Hot sauce. Tabasco sauce. Marinades. Taco seasonings. Relishes. Fats and Oils Regular salad dressings. Salted butter. Margarine. Ghee. Bacon fat.  Other Potato and tortilla chips. Corn chips and puffs. Salted popcorn and pretzels. Canned or dried soups. Pizza. Frozen entrees and pot pies.  The items listed above may not be a complete list of foods and beverages to avoid. Contact your dietitian for more information.   This information is not intended to replace advice given to you by your health care provider. Make sure you discuss any questions you have with your health care provider.   Document Released: 05/24/2002 Document Revised: 12/23/2014 Document Reviewed: 10/06/2013 Elsevier Interactive Patient Education [employer]

## 2015-11-20 ENCOUNTER — Telehealth: Payer: Self-pay | Admitting: *Deleted

## 2015-11-20 NOTE — Telephone Encounter (Signed)
Date of birth verified by pt Lab results given Low ferritin, pt anemic continue taking Iron as prescribed  Pt verbalized understanding

## 2015-11-20 NOTE — Telephone Encounter (Signed)
-----   Message from Dessa PhiJosalyn Funches, MD sent at 11/20/2015 10:47 AM EST ----- Patient is still anemic with Hgb 7 Low ferritin Continue iron as ordered

## 2015-12-05 ENCOUNTER — Telehealth: Payer: Self-pay | Admitting: Clinical

## 2015-12-05 NOTE — Telephone Encounter (Signed)
Follow-up with Ms. [name] reveals she has not been using her CPAP machine, as she has not purchased a replacement mask. Pt informed that she may go to www.sleepapnea.org, or call [employer] at  [phone] to purchase one mask for $25, two masks for $45, or three masks for $60, until they run out of masks. She is aware these masks retail for $185 each. Pt acknowledges that she understands.

## 2015-12-26 ENCOUNTER — Ambulatory Visit: Payer: Self-pay | Admitting: Family Medicine

## 2016-01-18 ENCOUNTER — Ambulatory Visit: Payer: Self-pay

## 2016-01-22 ENCOUNTER — Encounter: Payer: Self-pay | Attending: Family Medicine | Admitting: Skilled Nursing Facility1

## 2016-01-22 ENCOUNTER — Encounter (HOSPITAL_COMMUNITY): Payer: Self-pay | Admitting: *Deleted

## 2016-01-22 ENCOUNTER — Encounter: Payer: Self-pay | Admitting: Skilled Nursing Facility1

## 2016-01-22 ENCOUNTER — Emergency Department (HOSPITAL_COMMUNITY)
Admission: EM | Admit: 2016-01-22 | Discharge: 2016-01-22 | Disposition: A | Discharge status: Home / Self Care | Payer: Self-pay | Attending: Emergency Medicine | Admitting: Emergency Medicine

## 2016-01-22 DIAGNOSIS — R609 Edema, unspecified: Secondary | ICD-10-CM

## 2016-01-22 DIAGNOSIS — R262 Difficulty in walking, not elsewhere classified: Secondary | ICD-10-CM | POA: Insufficient documentation

## 2016-01-22 DIAGNOSIS — Z79899 Other long term (current) drug therapy: Secondary | ICD-10-CM | POA: Insufficient documentation

## 2016-01-22 DIAGNOSIS — J45909 Unspecified asthma, uncomplicated: Secondary | ICD-10-CM | POA: Insufficient documentation

## 2016-01-22 DIAGNOSIS — D509 Iron deficiency anemia, unspecified: Secondary | ICD-10-CM | POA: Insufficient documentation

## 2016-01-22 DIAGNOSIS — D500 Iron deficiency anemia secondary to blood loss (chronic): Secondary | ICD-10-CM | POA: Insufficient documentation

## 2016-01-22 DIAGNOSIS — F329 Major depressive disorder, single episode, unspecified: Secondary | ICD-10-CM | POA: Insufficient documentation

## 2016-01-22 DIAGNOSIS — R600 Localized edema: Secondary | ICD-10-CM | POA: Insufficient documentation

## 2016-01-22 DIAGNOSIS — M7989 Other specified soft tissue disorders: Secondary | ICD-10-CM | POA: Insufficient documentation

## 2016-01-22 DIAGNOSIS — J029 Acute pharyngitis, unspecified: Secondary | ICD-10-CM | POA: Insufficient documentation

## 2016-01-22 DIAGNOSIS — R519 Headache, unspecified: Secondary | ICD-10-CM

## 2016-01-22 DIAGNOSIS — R51 Headache: Secondary | ICD-10-CM | POA: Insufficient documentation

## 2016-01-22 DIAGNOSIS — Z6841 Body Mass Index (BMI) 40.0 and over, adult: Secondary | ICD-10-CM | POA: Insufficient documentation

## 2016-01-22 DIAGNOSIS — Z8669 Personal history of other diseases of the nervous system and sense organs: Secondary | ICD-10-CM | POA: Insufficient documentation

## 2016-01-22 LAB — BASIC METABOLIC PANEL
ANION GAP: 8 (ref 5–15)
BUN: 10 mg/dL (ref 6–20)
CHLORIDE: 104 mmol/L (ref 101–111)
CO2: 24 mmol/L (ref 22–32)
Calcium: 8.5 mg/dL — ABNORMAL LOW (ref 8.9–10.3)
Creatinine, Ser: 0.62 mg/dL (ref 0.44–1.00)
GFR calc Af Amer: >60 mL/min (ref >60)
GFR calc non Af Amer: >60 mL/min (ref >60)
GLUCOSE: 98 mg/dL (ref 65–99)
POTASSIUM: 3.8 mmol/L (ref 3.5–5.1)
Sodium: 136 mmol/L (ref 135–145)

## 2016-01-22 LAB — URINE MICROSCOPIC-ADD ON

## 2016-01-22 LAB — URINALYSIS, ROUTINE W REFLEX MICROSCOPIC
BILIRUBIN URINE: NEGATIVE
GLUCOSE, UA: NEGATIVE mg/dL
Ketones, ur: NEGATIVE mg/dL
Leukocytes, UA: NEGATIVE
NITRITE: NEGATIVE
PH: 6 (ref 5.0–8.0)
Protein, ur: NEGATIVE mg/dL
SPECIFIC GRAVITY, URINE: 1.028 (ref 1.005–1.030)

## 2016-01-22 LAB — CBC WITH DIFFERENTIAL/PLATELET
BASOS ABS: 0.1 10*3/uL (ref 0.0–0.1)
BASOS PCT: 1 %
EOS ABS: 0.1 10*3/uL (ref 0.0–0.7)
Eosinophils Relative: 2 %
HEMATOCRIT: 23.7 % — AB (ref 36.0–46.0)
Hemoglobin: 6.8 g/dL — CL (ref 12.0–15.0)
LYMPHS PCT: 36 %
Lymphs Abs: 2.6 10*3/uL (ref 0.7–4.0)
MCH: 15.9 pg — ABNORMAL LOW (ref 26.0–34.0)
MCHC: 28.7 g/dL — ABNORMAL LOW (ref 30.0–36.0)
MCV: 55.4 fL — AB (ref 78.0–100.0)
MONO ABS: 0.6 10*3/uL (ref 0.1–1.0)
Monocytes Relative: 8 %
NEUTROS ABS: 3.7 10*3/uL (ref 1.7–7.7)
NEUTROS PCT: 53 %
Platelets: 392 10*3/uL (ref 150–400)
RBC: 4.28 MIL/uL (ref 3.87–5.11)
RDW: 21.6 % — AB (ref 11.5–15.5)
WBC: 7.1 10*3/uL (ref 4.0–10.5)

## 2016-01-22 LAB — POC URINE PREG, ED: PREG TEST UR: NEGATIVE

## 2016-01-22 LAB — BRAIN NATRIURETIC PEPTIDE: B Natriuretic Peptide: 9.6 pg/mL (ref 0.0–100.0)

## 2016-01-22 MED ORDER — FERROUS SULFATE 325 (65 FE) MG PO TABS: 325.0000 mg | ORAL_TABLET | Freq: Every day | ORAL | Status: DC

## 2016-01-22 MED ORDER — METOCLOPRAMIDE HCL 5 MG/ML IJ SOLN
10.0000 mg | Freq: Once | INTRAMUSCULAR | Status: AC
  Administered 2016-01-22: 10 mg via INTRAMUSCULAR
  Filled 2016-01-22: qty 2

## 2016-01-22 MED ORDER — FUROSEMIDE 20 MG PO TABS: 20.0000 mg | ORAL_TABLET | Freq: Every day | ORAL | Status: DC

## 2016-01-22 MED ORDER — KETOROLAC TROMETHAMINE 60 MG/2ML IM SOLN
60.0000 mg | Freq: Once | INTRAMUSCULAR | Status: AC
  Administered 2016-01-22: 60 mg via INTRAMUSCULAR
  Filled 2016-01-22: qty 2

## 2016-01-22 MED ORDER — POTASSIUM CHLORIDE ER 20 MEQ PO TBCR: 20.0000 meq | EXTENDED_RELEASE_TABLET | Freq: Every day | ORAL | Status: DC

## 2016-01-22 NOTE — ED Provider Notes (Signed)
CSN: [id number]     Arrival date & time 01/22/16  0005 History  By signing my name below, I, Budd Palmer, attest that this documentation has been prepared under the direction and in the presence of Gilda Crease, MD. Electronically Signed: Budd Palmer, ED Scribe. 01/22/2016. 3:17 AM.    Chief Complaint  Patient presents with  . Headache   The history is provided by the patient. No language interpreter was used.   HPI Comments: [name] is a 25 y.o. female who presents to the Emergency Department complaining of a constant, occipital headache onset 3 days ago. She notes a PMHX of the same, but states that this HA has been lasting longer than usual. She has taken 800 mg Excedrin without relief. Pt denies cough and n/v/d.   She also c/o worsening leg swelling onset 3 weeks ago. She denies any recent changes in activity.   Past Medical History  Diagnosis Date  . Asthma   . Exogenous obesity   . Poor sleep hygiene   . 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 Narcolepsy cataplexy syndrome 2007    Prior sleep studies starting on September 26 2005, AHI 5.3 per hour,  . Clotting disorder [facility])    Past Surgical History  Procedure Laterality Date  . None     Family History  Problem Relation Age of Onset  . Hypertension Mother   . Asthma Mother   . Alcohol abuse Father   . Asthma Brother   . Alcohol abuse Maternal Grandmother    Social History  Substance Use Topics  . Smoking status: Never Smoker   . Smokeless tobacco: None  . Alcohol Use: Yes     Comment: ocassional   OB History    No data available     Review of Systems  Respiratory: Negative for cough.   Cardiovascular: Positive for leg swelling.  Gastrointestinal: Negative for nausea, vomiting and diarrhea.  Neurological: Positive for headaches.  All other systems reviewed and are negative.   Allergies   Banana  Home Medications   Prior to Admission medications   Medication Sig Start Date End Date Taking? Authorizing Provider  buPROPion (WELLBUTRIN XL) 300 MG 24 hr tablet Take 1 tablet (300 mg total) by mouth daily. 11/17/15   Dessa Phi, MD  Elastic Bandages & Supports (MEDICAL COMPRESSION STOCKINGS) MISC Wear daily. 30 mm Hg 11/17/15   Josalyn Funches, MD  ferrous sulfate 325 (65 FE) MG tablet Take 1 tablet (325 mg total) by mouth 2 (two) times daily with a meal. 11/17/15   Josalyn Funches, MD  ibuprofen (ADVIL,MOTRIN) 800 MG tablet Take 1 tablet (800 mg total) by mouth every 8 (eight) hours as needed. 11/17/15   Josalyn Funches, MD   BP 121/73 mmHg  Pulse 91  Temp(Src) 98.6 F (37 C) (Oral)  Resp 22  Ht  (1.6 m)  Wt 397 lb (180.078 kg)  BMI 70.34 kg/m2  SpO2 100%  LMP 12/22/2015 Physical Exam  Constitutional: She is oriented to person, place, and time. She appears well-developed and well-nourished. No distress.  HENT:  Head: Normocephalic and atraumatic.  Right Ear: Hearing normal.  Left Ear: Hearing normal.  Nose: Nose normal.  Mouth/Throat: Oropharynx is clear and moist and mucous membranes are normal.  Eyes: Conjunctivae and EOM are normal. Pupils are equal, round, and reactive to light.  Neck: Normal range of motion.  Neck supple.  Cardiovascular: Regular rhythm, S1 normal and S2 normal.  Exam reveals no gallop and no friction rub.   No murmur heard. Pulmonary/Chest: Effort normal and breath sounds normal. No respiratory distress. She exhibits no tenderness.  Abdominal: Soft. Normal appearance and bowel sounds are normal. There is no hepatosplenomegaly. There is no tenderness. There is no rebound, no guarding, no tenderness at McBurney's point and negative Murphy's sign. No hernia.  Musculoskeletal: Normal range of motion. She exhibits edema.  Non-pitting edema without erythema, warmth, or rash on the BLE  Neurological: She is alert and oriented to person, place,  and time. She has normal strength. No cranial nerve deficit or sensory deficit. Coordination normal. GCS eye subscore is 4. GCS verbal subscore is 5. GCS motor subscore is 6.  Skin: Skin is warm, dry and intact. No rash noted. No cyanosis or erythema.  Psychiatric: She has a normal mood and affect. Her speech is normal and behavior is normal. Thought content normal.  Nursing note and vitals reviewed.   ED Course  Procedures  DIAGNOSTIC STUDIES: Oxygen Saturation is 100% on RA, normal by my interpretation.    COORDINATION OF CARE: 3:05 AM - Discussed plans to order diagnostic studies and treat the HA. Pt advised of plan for treatment and pt agrees.  Labs Review Labs Reviewed  CBC WITH DIFFERENTIAL/PLATELET  BASIC METABOLIC PANEL  URINALYSIS, ROUTINE W REFLEX MICROSCOPIC (NOT AT [facility])  BRAIN NATRIURETIC PEPTIDE    Imaging Review No results found. I have personally reviewed and evaluated these images and lab results as part of my medical decision-making.   EKG Interpretation None      MDM   Final diagnoses:  None   headache Peripheral edema anemia  Patient presents to the ER for evaluation of headache. Patient reports that she does get frequent headaches, takes Excedrin and ibuprofen for them usually. Although this headache is not worse than headaches she has had previously, it has lasted longer than she usually has. Patient has no unusual neurologic findings. Her examination is essentially normal. She does, however, complain of leg swelling. Patient is morbidly obese, 400 pounds. It's difficult to tell what her baseline is. She does have some hardening of the lower legs bilaterally that seems consistent with edema, but there are no overlying skin changes. No signs of erythema or cellulitis. There is no unilateral swelling or obvious signs of DVT. This appears to be consistent with peripheral edema. Her blood work did not show any signs of congestive heart failure, renal failure.  She is anemic, however, but has a history of significant anemia, presumably from menstruation. She is at her baseline. Will prescribe iron supplement. Follow-up with PCP.  I personally performed the services described in this documentation, which was scribed in my presence. The recorded information has been reviewed and is accurate.   Gilda Crease, MD 01/22/16 [id number]

## 2016-01-22 NOTE — Patient Instructions (Signed)
-  Try not to get in the shower when you are sleepy -Try not to use the stove/oven/grill when no one is home -Try to march in place every day for 5 minutes (do longer if you can)-Don't forget to move your arms -Eat 3 meals a day and 2-3 snack a day -For example: breakfast   Snack    Lunch      Snack       Dinner     Maybe a snack -You do NOT have to eat raw vegetables -Get the steamable vegetables from the frozen section or canned vegetables -A meal: carbohydrate, meat, vegetables  -A snack: protein AND carbohydrate OR vegetable  -Frozen meals have A LOT of sodium  -Try frozen fish-Swai or Tilapia-On fish lemon juice is good-put the fish on a tinfoil pan in the oven at 350 degrees and cook until flaky  -Work on drinking more water -Try flavoring water -Try crystal light -Try to limit the diet soda and cool aid

## 2016-01-22 NOTE — Progress Notes (Signed)
Medical Nutrition Therapy:  Appt start time: 1400 end time:  1500.   Assessment:  Primary concerns today: referred for obesity. Pt having trouble keeping her eyes open and is barely getting her words out. Pt states she lives with her family. Pt states her mother/brothers/nephews Laughed at her for falling in the shower. Pt states she Has to get an orange card so she can get therapy at [facility]. Pt continuously Fiddled with ehr hands/rubbed legs/hands. Pt Hesitated when asked harm screening questions. Pt states she has ADHD which causes her to read things over more than once. Pt states she gets overwhelmed if there are a lot of words on the paper. Pt states if she talked to her mother about laughing at her, her mother would say she's being disrespectful.  Pt states she has sleep apnea. Pt can read but needs assistance-dietitian helped her fillout her paperwork. Pt states she has not gotten her prescriptions filled yet. 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 Pt states she has no privacy and shares a bedroom with her 2 brothers and 2 nephews which are preteens/teenagers and pt states they do not respect her and dont listen. Pt states her Mom blames her for everything. Pt states she wants to get out of her moms house and has a hearing for disability the 13th of Feruary. Pt states she Has to ask permission from her mom before leaving the house, mom asks if the house is clean, her mom usually denies that request. Pt states her step dad is on dialysis and lost both legs due to diabetes. Pt states she grocery shops for herself and cooks for herself. Pt states her mother is on disability. Pt states her Family will not allow her to nap and was told by her doctor napping will help with her narcolepsy. Pt states her Mask to the C-PAP is broken and costs 25 dollars to fix but has no funds to fix it.   Preferred Learning Style:   Auditory  Visual   Learning Readiness:  Ready  MEDICATIONS: See List    DIETARY INTAKE:  Usual eating pattern includes 2 meals and 3 snacks per day.  Everyday foods include frozen meals.  Avoided foods include none stated.    24-hr recall:  B ( AM): none Snk ( AM): none L ( PM): frozen dinners Snk ( PM): frozen foods D ( PM): hicken wings----hamburger helper----tacos Snk ( PM): tuna and eggs Beverages: diet soda, cool aid  Usual physical activity: ADL's  Estimated energy needs: 1800 calories 200 g carbohydrates 135 g protein 50 g fat  Progress Towards Goal(s):  In progress.   Nutritional Diagnosis:  NB-1.1 Food and nutrition-related knowledge deficit As related to no prior nutrition education from a nutrition professional .  As evidenced by pt report, 24 hr recall, and BMI 70.33.    Intervention:  Nutrition counseling for obesity. Dietitian educated the pt on inexpensive food options, being safe, things to do when she is feeling upset besides eat, and the importance of movement. Goals: -Try not to get in the shower when you are sleepy -Try not to use the stove/oven/grill when no one is home -Try to march in place every day for 5 minutes (do longer if you can)-Don't forget to move your arms -Eat 3 meals a day and 2-3 snack a day -For example: breakfast   Snack    [employer]  Maybe a snack -You do NOT have to eat raw vegetables -Get the steamable vegetables from the frozen section or canned vegetables -A meal: carbohydrate, meat, vegetables  -A snack: protein AND carbohydrate OR vegetable  -Frozen meals have A LOT of sodium  -Try frozen fish-Swai or Tilapia-On fish lemon juice is good-put the fish on a tinfoil pan in the oven at 350 degrees and cook until flaky  -Work on drinking more water -Try flavoring water -Try crystal light -Try to limit the diet soda and cool aid  Teaching Method Utilized:  Visual Auditory  Handouts given during visit include:  Snack sheet  MyPlate  Barriers to learning/adherence to  lifestyle change: disability/oppression   Demonstrated degree of understanding via:  Teach Back   Monitoring/Evaluation:  Dietary intake, exercise, and body weight prn.

## 2016-01-22 NOTE — ED Notes (Signed)
Informed provider of critical lab value

## 2016-01-22 NOTE — ED Notes (Signed)
The pt is c/o a headache for 3 days.  She is also c/o swollen legs for 2 weeks no know  Injury  lmp now

## 2016-01-22 NOTE — Discharge Instructions (Signed)
General Headache Without Cause A headache is pain or discomfort felt around the head or neck area. The specific cause of a headache may not be found. There are many causes and types of headaches. A few common ones are:  Tension headaches.  Migraine headaches.  Cluster headaches.  Chronic daily headaches. HOME CARE INSTRUCTIONS  Watch your condition for any changes. Take these steps to help with your condition: Managing Pain  Take over-the-counter and prescription medicines only as told by your health care provider.  Lie down in a dark, quiet room when you have a headache.  If directed, apply ice to the head and neck area:  Put ice in a plastic bag.  Place a towel between your skin and the bag.  Leave the ice on for 20 minutes, 2-3 times per day.  Use a heating pad or hot shower to apply heat to the head and neck area as told by your health care provider.  Keep lights dim if bright lights bother you or make your headaches worse. Eating and Drinking  Eat meals on a regular schedule.  Limit alcohol use.  Decrease the amount of caffeine you drink, or stop drinking caffeine. General Instructions  Keep all follow-up visits as told by your health care provider. This is important.  Keep a headache journal to help find out what may trigger your headaches. For example, write down:  What you eat and drink.  How much sleep you get.  Any change to your diet or medicines.  Try massage or other relaxation techniques.  Limit stress.  Sit up straight, and do not tense your muscles.  Do not use tobacco products, including cigarettes, chewing tobacco, or e-cigarettes. If you need help quitting, ask your health care provider.  Exercise regularly as told by your health care provider.  Sleep on a regular schedule. Get 7-9 hours of sleep, or the amount recommended by your health care provider. SEEK MEDICAL CARE IF:   Your symptoms are not helped by medicine.  You have a  headache that is different from the usual headache.  You have nausea or you vomit.  You have a fever. SEEK IMMEDIATE MEDICAL CARE IF:   Your headache becomes severe.  You have repeated vomiting.  You have a stiff neck.  You have a loss of vision.  You have problems with speech.  You have pain in the eye or ear.  You have muscular weakness or loss of muscle control.  You lose your balance or have trouble walking.  You feel faint or pass out.  You have confusion.   This information is not intended to replace advice given to you by your health care provider. Make sure you discuss any questions you have with your health care provider.   Document Released: 12/02/2005 Document Revised: 08/23/2015 Document Reviewed: 03/27/2015 Elsevier Interactive Patient Education 2016 Elsevier Inc.  Edema Edema is an abnormal buildup of fluids in your bodytissues. Edema is somewhatdependent on gravity to pull the fluid to the lowest place in your body. That makes the condition more common in the legs and thighs (lower extremities). Painless swelling of the feet and ankles is common and becomes more likely as you get older. It is also common in looser tissues, like around your eyes.  When the affected area is squeezed, the fluid may move out of that spot and leave a dent for a few moments. This dent is called pitting.  CAUSES  There are many possible causes of edema. Eating  too much salt and being on your feet or sitting for a long time can cause edema in your legs and ankles. Hot weather may make edema worse. Common medical causes of edema include:  Heart failure.  Liver disease.  Kidney disease.  Weak blood vessels in your legs.  Cancer.  An injury.  Pregnancy.  Some medications.  Obesity. SYMPTOMS  Edema is usually painless.Your skin may look swollen or shiny.  DIAGNOSIS  Your health care provider may be able to diagnose edema by asking about your medical history and doing  a physical exam. You may need to have tests such as X-rays, an electrocardiogram, or blood tests to check for medical conditions that may cause edema.  TREATMENT  Edema treatment depends on the cause. If you have heart, liver, or kidney disease, you need the treatment appropriate for these conditions. General treatment may include:  Elevation of the affected body part above the level of your heart.  Compression of the affected body part. Pressure from elastic bandages or support stockings squeezes the tissues and forces fluid back into the blood vessels. This keeps fluid from entering the tissues.  Restriction of fluid and salt intake.  Use of a water pill (diuretic). These medications are appropriate only for some types of edema. They pull fluid out of your body and make you urinate more often. This gets rid of fluid and reduces swelling, but diuretics can have side effects. Only use diuretics as directed by your health care provider. HOME CARE INSTRUCTIONS   Keep the affected body part above the level of your heart when you are lying down.   Do not sit still or stand for prolonged periods.   Do not put anything directly under your knees when lying down.  Do not wear constricting clothing or garters on your upper legs.   Exercise your legs to work the fluid back into your blood vessels. This may help the swelling go down.   Wear elastic bandages or support stockings to reduce ankle swelling as directed by your health care provider.   Eat a low-salt diet to reduce fluid if your health care provider recommends it.   Only take medicines as directed by your health care provider. SEEK MEDICAL CARE IF:   Your edema is not responding to treatment.  You have heart, liver, or kidney disease and notice symptoms of edema.  You have edema in your legs that does not improve after elevating them.   You have sudden and unexplained weight gain. SEEK IMMEDIATE MEDICAL CARE IF:   You  develop shortness of breath or chest pain.   You cannot breathe when you lie down.  You develop pain, redness, or warmth in the swollen areas.   You have heart, liver, or kidney disease and suddenly get edema.  You have a fever and your symptoms suddenly get worse. MAKE SURE YOU:   Understand these instructions.  Will watch your condition.  Will get help right away if you are not doing well or get worse.   This information is not intended to replace advice given to you by your health care provider. Make sure you discuss any questions you have with your health care provider.   Document Released: 12/02/2005 Document Revised: 12/23/2014 Document Reviewed: 09/24/2013 Elsevier Interactive Patient Education [employer]

## 2016-01-25 ENCOUNTER — Ambulatory Visit: Payer: Self-pay

## 2016-02-21 ENCOUNTER — Ambulatory Visit: Payer: Self-pay | Attending: Family Medicine

## 2016-03-12 ENCOUNTER — Ambulatory Visit: Payer: Self-pay | Admitting: Family Medicine

## 2016-03-22 ENCOUNTER — Encounter: Payer: Self-pay | Admitting: Clinical

## 2016-03-22 ENCOUNTER — Ambulatory Visit: Payer: Self-pay | Attending: Family Medicine | Admitting: Family Medicine

## 2016-03-22 ENCOUNTER — Encounter: Payer: Self-pay | Admitting: Family Medicine

## 2016-03-22 VITALS — BP 111/70 | HR 85 | Temp 98.6°F | Resp 18 | Ht 63.0 in | Wt 381.0 lb

## 2016-03-22 DIAGNOSIS — G47419 Narcolepsy without cataplexy: Secondary | ICD-10-CM

## 2016-03-22 DIAGNOSIS — H5711 Ocular pain, right eye: Secondary | ICD-10-CM | POA: Insufficient documentation

## 2016-03-22 DIAGNOSIS — H00013 Hordeolum externum right eye, unspecified eyelid: Secondary | ICD-10-CM

## 2016-03-22 DIAGNOSIS — F988 Other specified behavioral and emotional disorders with onset usually occurring in childhood and adolescence: Secondary | ICD-10-CM

## 2016-03-22 DIAGNOSIS — M722 Plantar fascial fibromatosis: Secondary | ICD-10-CM

## 2016-03-22 DIAGNOSIS — R400 Somnolence: Secondary | ICD-10-CM | POA: Insufficient documentation

## 2016-03-22 DIAGNOSIS — M79671 Pain in right foot: Secondary | ICD-10-CM | POA: Insufficient documentation

## 2016-03-22 DIAGNOSIS — K219 Gastro-esophageal reflux disease without esophagitis: Secondary | ICD-10-CM

## 2016-03-22 DIAGNOSIS — Z113 Encounter for screening for infections with a predominantly sexual mode of transmission: Secondary | ICD-10-CM

## 2016-03-22 DIAGNOSIS — Z79899 Other long term (current) drug therapy: Secondary | ICD-10-CM | POA: Insufficient documentation

## 2016-03-22 DIAGNOSIS — J309 Allergic rhinitis, unspecified: Secondary | ICD-10-CM

## 2016-03-22 DIAGNOSIS — H578 Other specified disorders of eye and adnexa: Secondary | ICD-10-CM | POA: Insufficient documentation

## 2016-03-22 DIAGNOSIS — N920 Excessive and frequent menstruation with regular cycle: Secondary | ICD-10-CM

## 2016-03-22 DIAGNOSIS — D509 Iron deficiency anemia, unspecified: Secondary | ICD-10-CM

## 2016-03-22 DIAGNOSIS — F900 Attention-deficit hyperactivity disorder, predominantly inattentive type: Secondary | ICD-10-CM

## 2016-03-22 DIAGNOSIS — F909 Attention-deficit hyperactivity disorder, unspecified type: Secondary | ICD-10-CM | POA: Insufficient documentation

## 2016-03-22 LAB — HIV ANTIBODY (ROUTINE TESTING W REFLEX): HIV: NONREACTIVE

## 2016-03-22 MED ORDER — NORGESTIMATE-ETH ESTRADIOL 0.25-35 MG-MCG PO TABS: 1.0000 | ORAL_TABLET | Freq: Every day | ORAL | Status: DC

## 2016-03-22 MED ORDER — FERROUS SULFATE 325 (65 FE) MG PO TABS: 325.0000 mg | ORAL_TABLET | Freq: Three times a day (TID) | ORAL | Status: DC

## 2016-03-22 MED ORDER — FERROUS SULFATE 325 (65 FE) MG PO TABS: 325.0000 mg | ORAL_TABLET | Freq: Every day | ORAL | Status: DC

## 2016-03-22 MED ORDER — OMEPRAZOLE 20 MG PO CPDR: 20.0000 mg | DELAYED_RELEASE_CAPSULE | Freq: Every day | ORAL | Status: DC

## 2016-03-22 MED ORDER — METHYLPHENIDATE HCL 5 MG PO TABS: 5.0000 mg | ORAL_TABLET | Freq: Two times a day (BID) | ORAL | Status: DC

## 2016-03-22 MED FILL — ?OMEPRAZOLE DR 20 MG CAPSUL: 20 | 30 days supply | Qty: 30 | Fill #0

## 2016-03-22 MED FILL — MONO-LINYAH 28 TABLET: 0.25-35 | 28 days supply | Qty: 28 | Fill #0

## 2016-03-22 NOTE — Progress Notes (Signed)
Pain on Rt foot, possible cyst on rt eye  Want to be tested for STD  Pain scale # 7 No suicidal thought in the past two weeks  No tobacco user

## 2016-03-22 NOTE — Progress Notes (Signed)
[facility] 03/22/2016 01/22/2016 11/17/2015 07/07/2015 06/16/2015  Decreased Interest 2 1 0 1 3  Down, Depressed, Hopeless 2 1 0 3 3  PHQ - 2 Score 4 2 0 4 6  Altered sleeping 3 - - 3 3  Tired, decreased energy 3 - - 2 3  Change in appetite 2 - - 3 3  Feeling bad or failure about yourself  3 - - 3 3  Trouble concentrating 3 - - 2 1  Moving slowly or fidgety/restless - - - 3 0  Suicidal thoughts 0 - - 0 0  PHQ-9 Score 18 - - 20 19    GAD 7 : Generalized Anxiety Score 03/22/2016  Nervous, Anxious, on Edge 2  Control/stop worrying 1  Worry too much - different things 1  Trouble relaxing 2  Restless 1  Easily annoyed or irritable 2  Afraid - awful might happen 2  Total GAD 7 Score 11

## 2016-03-22 NOTE — Progress Notes (Signed)
Subjective:  Patient ID: [name], female    DOB: 05-30-1991  Age: 25 y.o. MRN: [id number]  CC: Stye and Foot Pain   HPI [name] presents for    1. R eye pain: x 10 days. Tops of eye. With swelling in eye lid. No red eye. Had scant yellow discharge from eye.   2. R foot pain: x 2 days. On the bottom. Midfoot. No known injury.  Pain is exacerbated by standing or walking. pain is relieved by sitting. Has hx of plantar fascitis.   3. Requesting STD testing: no vaginal discharge. No lesions. Last sexually active about 3 weeks. Just oral sex so far. No condom use.   4. Narcolepsy/ADHD: was previously treated with ritalin 30 mg daily. Untreated, uninsured, unemployed. Has somnolence and weight gain.   5. IDA: taking iron once daily. Feels tired. No syncope. No cold intolerance. No CP or SOB. She admits to heavy menstrual periods.   Social History  Substance Use Topics  . Smoking status: Never Smoker   . Smokeless tobacco: Not on file  . Alcohol Use: Yes     Comment: ocassional    Outpatient Prescriptions Prior to Visit  Medication Sig Dispense Refill  . ferrous sulfate 325 (65 FE) MG tablet Take 1 tablet (325 mg total) by mouth daily. 30 tablet 0  . ibuprofen (ADVIL,MOTRIN) 800 MG tablet Take 1 tablet (800 mg total) by mouth every 8 (eight) hours as needed. (Patient taking differently: Take 800 mg by mouth every 8 (eight) hours as needed for moderate pain. ) 60 tablet 2  . furosemide (LASIX) 20 MG tablet Take 1 tablet (20 mg total) by mouth daily. (Patient not taking: Reported on 03/22/2016) 5 tablet 0  . potassium chloride 20 MEQ TBCR Take 20 mEq by mouth daily. (Patient not taking: Reported on 03/22/2016) 5 tablet 0   No facility-administered medications prior to visit.    ROS Review of Systems  Constitutional: Positive for fatigue. Negative for fever and chills.  HENT: Negative for sore throat.   Eyes: Positive for pain. Negative for visual disturbance.    Respiratory: Negative for shortness of breath.   Cardiovascular: Negative for chest pain and leg swelling.  Gastrointestinal: Negative for abdominal pain and blood in stool.  Genitourinary: Positive for menstrual problem. Negative for vaginal discharge.  Musculoskeletal: Positive for arthralgias and gait problem. Negative for back pain.  Skin: Negative for rash.  Allergic/Immunologic: Negative for immunocompromised state.  Hematological: Negative for adenopathy. Does not bruise/bleed easily.  Psychiatric/Behavioral: Positive for sleep disturbance. Negative for suicidal ideas and dysphoric mood.    Objective:  BP 111/70 mmHg  Pulse 85  Temp(Src) 98.6 F (37 C) (Oral)  Resp 18  Ht 5\' 3"  (1.6 m)  Wt 381 lb (172.82 kg)  BMI 67.51 kg/m2  SpO2 100%  LMP 03/19/2016  BP/Weight 03/22/2016 01/22/2016 01/22/2016  Systolic BP 111 106 -  Diastolic BP 70 62 -  Wt. (Lbs) 381 397 397  BMI 67.51 70.34 70.34    Physical Exam  Constitutional: She is oriented to person, place, and time. She appears well-developed and well-nourished. No distress.  Morbid obesity   HENT:  Head: Normocephalic and atraumatic.  Right Ear: Tympanic membrane, external ear and ear canal normal.  Left Ear: Tympanic membrane, external ear and ear canal normal.  Nose: Mucosal edema present.  Mouth/Throat: Oropharynx is clear and moist.  Eyes: Conjunctivae and EOM are normal. Pupils are equal, round, and reactive to light. Lids  are everted and swept, no foreign bodies found. Right eye exhibits hordeolum. Right eye exhibits no chemosis, no discharge and no exudate. No foreign body present in the right eye.    Cardiovascular: Normal rate, regular rhythm, normal heart sounds and intact distal pulses.   Pulmonary/Chest: Effort normal and breath sounds normal.  Musculoskeletal: She exhibits no edema.       Feet:  Neurological: She is alert and oriented to person, place, and time.  Skin: Skin is warm and dry. No rash noted.   Psychiatric: She has a normal mood and affect.     Assessment & Plan:   There are no diagnoses linked to this encounter. [name] was seen today for stye and foot pain.  Diagnoses and all orders for this visit:  NARCOLEPSY W/O CATAPLEXY -     methylphenidate (RITALIN) 5 MG tablet; Take 1 tablet (5 mg total) by mouth 2 (two) times daily with breakfast and lunch. For one week then 10 mg twice daily  ADD (attention deficit disorder) without hyperactivity -     methylphenidate (RITALIN) 5 MG tablet; Take 1 tablet (5 mg total) by mouth 2 (two) times daily with breakfast and lunch. For one week then 10 mg twice daily  Microcytic anemia -     CBC -     Discontinue: ferrous sulfate 325 (65 FE) MG tablet; Take 1 tablet (325 mg total) by mouth daily. -     ferrous sulfate 325 (65 FE) MG tablet; Take 1 tablet (325 mg total) by mouth 3 (three) times daily with meals.  Gastroesophageal reflux disease, esophagitis presence not specified -     omeprazole (PRILOSEC) 20 MG capsule; Take 1 capsule (20 mg total) by mouth daily before supper.  Routine screening for STI (sexually transmitted infection) -     HIV antibody (with reflex) -     RPR -     Cytology (oral, anal, urethral) ancillary only  MENORRHAGIA -     norgestimate-ethinyl estradiol (SPRINTEC 28) 0.25-35 MG-MCG tablet; Take 1 tablet by mouth daily.   Meds ordered this encounter  Medications  . DISCONTD: ferrous sulfate 325 (65 FE) MG tablet    Sig: Take 1 tablet (325 mg total) by mouth daily.    Dispense:  30 tablet    Refill:  0  . omeprazole (PRILOSEC) 20 MG capsule    Sig: Take 1 capsule (20 mg total) by mouth daily before supper.    Dispense:  30 capsule    Refill:  5  . ferrous sulfate 325 (65 FE) MG tablet    Sig: Take 1 tablet (325 mg total) by mouth 3 (three) times daily with meals.    Dispense:  90 tablet    Refill:  5    Follow-up: No Follow-up on file.   Dessa Phi MD

## 2016-03-22 NOTE — Patient Instructions (Addendum)
[name] was seen today for stye and foot pain.  Diagnoses and all orders for this visit:  NARCOLEPSY W/O CATAPLEXY -     methylphenidate (RITALIN) 5 MG tablet; Take 1 tablet (5 mg total) by mouth 2 (two) times daily with breakfast and lunch. For one week then 10 mg twice daily  ADD (attention deficit disorder) without hyperactivity -     methylphenidate (RITALIN) 5 MG tablet; Take 1 tablet (5 mg total) by mouth 2 (two) times daily with breakfast and lunch. For one week then 10 mg twice daily  Microcytic anemia -     CBC -     Discontinue: ferrous sulfate 325 (65 FE) MG tablet; Take 1 tablet (325 mg total) by mouth daily. -     ferrous sulfate 325 (65 FE) MG tablet; Take 1 tablet (325 mg total) by mouth 3 (three) times daily with meals.  Gastroesophageal reflux disease, esophagitis presence not specified -     omeprazole (PRILOSEC) 20 MG capsule; Take 1 capsule (20 mg total) by mouth daily before supper.  Routine screening for STI (sexually transmitted infection) -     HIV antibody (with reflex) -     RPR -     Cytology (oral, anal, urethral) ancillary only  MENORRHAGIA -     norgestimate-ethinyl estradiol (SPRINTEC 28) 0.25-35 MG-MCG tablet; Take 1 tablet by mouth daily.    F/u in 2 weeks after starting ritalin for ADHD/narcolepsy. Ritalin is a controlled substance and is not available through [facility]. Please take Rx to [facility] or [facility] to check cost.   Dr. Armen PickupFunches

## 2016-03-23 LAB — RPR

## 2016-03-24 LAB — CBC

## 2016-03-25 ENCOUNTER — Telehealth: Payer: Self-pay | Admitting: *Deleted

## 2016-03-25 DIAGNOSIS — M722 Plantar fascial fibromatosis: Secondary | ICD-10-CM | POA: Insufficient documentation

## 2016-03-25 DIAGNOSIS — H00019 Hordeolum externum unspecified eye, unspecified eyelid: Secondary | ICD-10-CM | POA: Insufficient documentation

## 2016-03-25 LAB — CYTOLOGY, (ORAL, ANAL, URETHRAL) ANCILLARY ONLY
Chlamydia: NEGATIVE
Neisseria Gonorrhea: NEGATIVE

## 2016-03-25 MED ORDER — FLUTICASONE PROPIONATE 50 MCG/ACT NA SUSP
2.0000 | Freq: Every day | NASAL | Status: DC
Start: 2016-03-25 — End: 2016-08-23

## 2016-03-25 NOTE — Telephone Encounter (Signed)
-----   Message from Dessa PhiJosalyn Funches, MD sent at 03/25/2016  8:58 AM EDT ----- RPR and HIV screening is negative For some reason CBC was cancelled  Please come in at earliest convenience to have CBC drawn to follow up anemia

## 2016-03-25 NOTE — Telephone Encounter (Signed)
LVM to return call   Pt need lab appointment schedule

## 2016-03-25 NOTE — Assessment & Plan Note (Signed)
Discussed treatment with warm compress Avoid picking at stye

## 2016-03-25 NOTE — Telephone Encounter (Signed)
-----   Message from Dessa PhiJosalyn Funches, MD sent at 03/25/2016  4:45 PM EDT ----- Negative GC/chlam swab

## 2016-03-25 NOTE — Assessment & Plan Note (Addendum)
PT R foot in obese patient  Plan: Weight loss Ice  Stretches

## 2016-03-25 NOTE — Assessment & Plan Note (Signed)
Menorrhagia with anemia. She declines IUD. Amenable to OCP Start OCP Continue iron Check CBC and iron studies

## 2016-04-01 ENCOUNTER — Telehealth: Payer: Self-pay | Admitting: Family Medicine

## 2016-04-01 NOTE — Telephone Encounter (Signed)
Patient called stating that the stye on her eye is still bothering her and forgot what the instructions where to treat. Please f/u

## 2016-04-04 NOTE — Telephone Encounter (Signed)
Pt. Called stating she received a call from the nurse and that the nurse had stated to give her a call back. Please f/u with pt.

## 2016-04-05 NOTE — Telephone Encounter (Signed)
Returned pt call Pt was advised on last OV   treatment with warm compress Avoid picking at stye  Pt verbalized understanding

## 2016-04-05 NOTE — Telephone Encounter (Signed)
Date of birth verified by pt  Negative GC/Chlam, HIV negative, Pt stated has appointment on 4/28 with PCP Will do labs then

## 2016-04-12 ENCOUNTER — Ambulatory Visit: Payer: Self-pay | Attending: Family Medicine | Admitting: Family Medicine

## 2016-04-12 ENCOUNTER — Encounter: Payer: Self-pay | Admitting: Family Medicine

## 2016-04-12 VITALS — BP 108/68 | HR 75 | Temp 98.1°F | Resp 16 | Ht 63.0 in | Wt 380.0 lb

## 2016-04-12 DIAGNOSIS — D509 Iron deficiency anemia, unspecified: Secondary | ICD-10-CM

## 2016-04-12 DIAGNOSIS — R5383 Other fatigue: Secondary | ICD-10-CM | POA: Insufficient documentation

## 2016-04-12 DIAGNOSIS — Z79899 Other long term (current) drug therapy: Secondary | ICD-10-CM | POA: Insufficient documentation

## 2016-04-12 DIAGNOSIS — G44209 Tension-type headache, unspecified, not intractable: Secondary | ICD-10-CM

## 2016-04-12 DIAGNOSIS — G44219 Episodic tension-type headache, not intractable: Secondary | ICD-10-CM

## 2016-04-12 LAB — CBC
HEMATOCRIT: 27.7 % — AB (ref 35.0–45.0)
HEMOGLOBIN: 7.8 g/dL — AB (ref 11.7–15.5)
MCH: 16.1 pg — ABNORMAL LOW (ref 27.0–33.0)
MCHC: 28.2 g/dL — ABNORMAL LOW (ref 32.0–36.0)
MCV: 57.2 fL — ABNORMAL LOW (ref 80.0–100.0)
Platelets: 432 10*3/uL — ABNORMAL HIGH (ref 140–400)
RBC: 4.84 MIL/uL (ref 3.80–5.10)
RDW: 21.2 % — ABNORMAL HIGH (ref 11.0–15.0)
WBC: 6.4 10*3/uL (ref 3.8–10.8)

## 2016-04-12 LAB — IRON AND TIBC
%SAT: 3 % — AB (ref 11–50)
IRON: 13 ug/dL — AB (ref 40–190)
TIBC: 457 ug/dL — AB (ref 250–450)
UIBC: 444 ug/dL — AB (ref 125–400)

## 2016-04-12 LAB — FERRITIN: FERRITIN: 6 ng/mL — AB (ref 10–154)

## 2016-04-12 MED ORDER — KETOROLAC TROMETHAMINE 60 MG/2ML IM SOLN
60.0000 mg | Freq: Once | INTRAMUSCULAR | Status: AC
  Administered 2016-04-12: 60 mg via INTRAMUSCULAR

## 2016-04-12 MED ORDER — ONDANSETRON HCL 4 MG PO TABS
8.0000 mg | ORAL_TABLET | Freq: Once | ORAL | Status: AC
  Administered 2016-04-12: 8 mg via ORAL

## 2016-04-12 NOTE — Progress Notes (Signed)
Nausea, HA weakness x 1 day Tobacco user  No suicidal thoughts in the past two weeks  Pain scale # 7

## 2016-04-12 NOTE — Assessment & Plan Note (Signed)
Chronic anemia CBC and iron studies today Continue current iron

## 2016-04-12 NOTE — Assessment & Plan Note (Signed)
Tension headache, no red flags   Treated with toradol and zofran in office today Trial off sprintec for a few days

## 2016-04-12 NOTE — Patient Instructions (Signed)
[name] was seen today for fatigue.  Diagnoses and all orders for this visit:  Tension headache -     ketorolac (TORADOL) injection 60 mg; Inject 2 mLs (60 mg total) into the muscle once. -     ondansetron (ZOFRAN) tablet 8 mg; Take 2 tablets (8 mg total) by mouth once.  Microcytic anemia   Shop around for ritalin best cost  Labs done today  F/u in 3 weeks for headache and anemia   Dr. Armen PickupFunches

## 2016-04-12 NOTE — Progress Notes (Signed)
Subjective:  Patient ID: [name], female    DOB: [birth year]  Age: 25 y.o. MRN: [id number]  CC: Fatigue   HPI [name] presents for    1. Fatigue: worsening over the past week. Seeing a little vaginal bleeding lately. Also having bad headache. She has known severe anemia. She is taking sprintec irregularly and iron 1-3 times a day. Her periods have lightened since starting sprintec.   2. Headache: since last night. B/l temple and forehead. Also R occipital. She describes the pain as tension. She took equate headache medicine that helped temporarily. Admits to nausea and feeling like she will pass out.   3. 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 Uninsured. Worried about how she will afford the medication.   Social History  Substance Use Topics  . Smoking status: Never Smoker   . Smokeless tobacco: Not on file  . Alcohol Use: Yes     Comment: ocassional    Outpatient Prescriptions Prior to Visit  Medication Sig Dispense Refill  . ferrous sulfate 325 (65 FE) MG tablet Take 1 tablet (325 mg total) by mouth 3 (three) times daily with meals. 90 tablet 5  . fluticasone (FLONASE) 50 MCG/ACT nasal spray Place 2 sprays into both nostrils daily. 16 g 6  . methylphenidate (RITALIN) 5 MG tablet Take 1 tablet (5 mg total) by mouth 2 (two) times daily with breakfast and lunch. For one week then 10 mg twice daily 120 tablet 0  . norgestimate-ethinyl estradiol (SPRINTEC 28) 0.25-35 MG-MCG tablet Take 1 tablet by mouth daily. 1 Package 11  . omeprazole (PRILOSEC) 20 MG capsule Take 1 capsule (20 mg total) by mouth daily before supper. 30 capsule 5   No facility-administered medications prior to visit.    ROS Review of Systems  Constitutional: Positive for fatigue. Negative for fever and chills.  HENT: Positive for rhinorrhea. Negative for congestion and sore throat.   Eyes: Negative for visual disturbance.  Respiratory: Negative for cough and shortness of breath.     Cardiovascular: Negative for chest pain and leg swelling.  Gastrointestinal: Positive for nausea. Negative for abdominal pain and blood in stool.  Genitourinary: Positive for menstrual problem. Negative for vaginal discharge.  Musculoskeletal: Positive for arthralgias and gait problem. Negative for back pain.  Skin: Negative for rash.  Allergic/Immunologic: Negative for immunocompromised state.  Neurological: Positive for headaches.  Hematological: Negative for adenopathy. Does not bruise/bleed easily.  Psychiatric/Behavioral: Positive for sleep disturbance. Negative for suicidal ideas and dysphoric mood.    Objective:  BP 108/68 mmHg  Pulse 75  Temp(Src) 98.1 F (36.7 C) (Oral)  Resp 16  Ht 5\' 3"  (1.6 m)  Wt 380 lb (172.367 kg)  BMI 67.33 kg/m2  SpO2 99%  LMP 03/19/2016  BP/Weight 04/12/2016 03/22/2016 01/22/2016  Systolic BP 108 111 106  Diastolic BP 68 70 62  Wt. (Lbs) 380 381 397  BMI 67.33 67.51 70.34    Physical Exam  Constitutional: She is oriented to person, place, and time. She appears well-developed and well-nourished. No distress.  Obese   HENT:  Head: Normocephalic and atraumatic.  Nose: Right sinus exhibits no maxillary sinus tenderness and no frontal sinus tenderness. Left sinus exhibits no maxillary sinus tenderness and no frontal sinus tenderness.  Mouth/Throat: Oropharynx is clear and moist.  Eyes: Conjunctivae and EOM are normal. Pupils are equal, round, and reactive to light.  Cardiovascular: Normal rate, regular rhythm, normal heart sounds and intact distal pulses.   Pulmonary/Chest: Effort normal and  breath sounds normal.  Musculoskeletal: She exhibits no edema.  Neurological: She is alert and oriented to person, place, and time.  Skin: Skin is warm and dry. No rash noted.  Psychiatric: She has a normal mood and affect.   Treated with toradol and zofran   Depression screen [facility] 2/9 04/12/2016 03/22/2016 01/22/2016  Decreased Interest Down,  Depressed, Hopeless PHQ - 2 Score Altered sleeping 3 3 -  Tired, decreased energy 3 3 -  Change in appetite 3 2 -  Feeling bad or failure about yourself  3 3 -  Trouble concentrating 2 3 -  Moving slowly or fidgety/restless 2 - -  Suicidal thoughts 0 0 -  PHQ-9 Score 21 18 -    GAD 7 : Generalized Anxiety Score 04/12/2016 03/22/2016  Nervous, Anxious, on Edge 3 2  Control/stop worrying 2 1  Worry too much - different things 2 1  Trouble relaxing 3 2  Restless 0 1  Easily annoyed or irritable 3 2  Afraid - awful might happen 2 2  Total GAD 7 Score 15 11    Assessment & Plan:   There are no diagnoses linked to this encounter. Ladon was seen today for fatigue.  Diagnoses and all orders for this visit:  Tension headache -     ketorolac (TORADOL) injection 60 mg; Inject 2 mLs (60 mg total) into the muscle once. -     ondansetron (ZOFRAN) tablet 8 mg; Take 2 tablets (8 mg total) by mouth once.  Microcytic anemia -     CBC -     Iron and TIBC -     Ferritin   Meds ordered this encounter  Medications  . ketorolac (TORADOL) injection 60 mg    Sig:   . ondansetron (ZOFRAN) tablet 8 mg    Sig:     Follow-up: No Follow-up on file.   Dessa Phi MD

## 2016-04-12 NOTE — Assessment & Plan Note (Signed)
>>  ASSESSMENT AND PLAN FOR IRON  DEFICIENCY ANEMIA WRITTEN ON 04/12/2016  6:34 PM BY FUNCHES, JOSALYN, MD  Chronic anemia CBC and iron  studies today Continue current iron

## 2016-04-15 ENCOUNTER — Telehealth: Payer: Self-pay | Admitting: Family Medicine

## 2016-04-15 NOTE — Telephone Encounter (Signed)
Patient called requesting lab results, please f/up °

## 2016-04-15 NOTE — Telephone Encounter (Signed)
Pt. Called requesting to speak to the nurse about her lab results that she had. Pt. Would like to know her results and also she is not feeling well. Please f/u with pt.

## 2016-04-18 ENCOUNTER — Emergency Department (HOSPITAL_COMMUNITY)
Admission: EM | Admit: 2016-04-18 | Discharge: 2016-04-18 | Disposition: A | Discharge status: Home / Self Care | Payer: Self-pay | Attending: Emergency Medicine | Admitting: Emergency Medicine

## 2016-04-18 ENCOUNTER — Encounter (HOSPITAL_COMMUNITY): Payer: Self-pay | Admitting: Emergency Medicine

## 2016-04-18 DIAGNOSIS — N939 Abnormal uterine and vaginal bleeding, unspecified: Secondary | ICD-10-CM | POA: Insufficient documentation

## 2016-04-18 DIAGNOSIS — E669 Obesity, unspecified: Secondary | ICD-10-CM | POA: Insufficient documentation

## 2016-04-18 DIAGNOSIS — Z8669 Personal history of other diseases of the nervous system and sense organs: Secondary | ICD-10-CM | POA: Insufficient documentation

## 2016-04-18 DIAGNOSIS — D500 Iron deficiency anemia secondary to blood loss (chronic): Secondary | ICD-10-CM | POA: Insufficient documentation

## 2016-04-18 DIAGNOSIS — Z793 Long term (current) use of hormonal contraceptives: Secondary | ICD-10-CM | POA: Insufficient documentation

## 2016-04-18 DIAGNOSIS — Z79899 Other long term (current) drug therapy: Secondary | ICD-10-CM | POA: Insufficient documentation

## 2016-04-18 DIAGNOSIS — J45909 Unspecified asthma, uncomplicated: Secondary | ICD-10-CM | POA: Insufficient documentation

## 2016-04-18 DIAGNOSIS — R200 Anesthesia of skin: Secondary | ICD-10-CM | POA: Insufficient documentation

## 2016-04-18 DIAGNOSIS — R51 Headache: Secondary | ICD-10-CM | POA: Insufficient documentation

## 2016-04-18 DIAGNOSIS — Z7952 Long term (current) use of systemic steroids: Secondary | ICD-10-CM | POA: Insufficient documentation

## 2016-04-18 LAB — BASIC METABOLIC PANEL
Anion gap: 9 (ref 5–15)
BUN: 8 mg/dL (ref 6–20)
CHLORIDE: 106 mmol/L (ref 101–111)
CO2: 23 mmol/L (ref 22–32)
CREATININE: 0.58 mg/dL (ref 0.44–1.00)
Calcium: 9 mg/dL (ref 8.9–10.3)
Glucose, Bld: 112 mg/dL — ABNORMAL HIGH (ref 65–99)
POTASSIUM: 4.3 mmol/L (ref 3.5–5.1)
SODIUM: 138 mmol/L (ref 135–145)

## 2016-04-18 LAB — URINALYSIS, ROUTINE W REFLEX MICROSCOPIC
Bilirubin Urine: NEGATIVE
GLUCOSE, UA: NEGATIVE mg/dL
KETONES UR: NEGATIVE mg/dL
LEUKOCYTES UA: NEGATIVE
NITRITE: NEGATIVE
PROTEIN: NEGATIVE mg/dL
Specific Gravity, Urine: 1.019 (ref 1.005–1.030)
pH: 6 (ref 5.0–8.0)

## 2016-04-18 LAB — URINE MICROSCOPIC-ADD ON

## 2016-04-18 LAB — CBC
HCT: 26.5 % — ABNORMAL LOW (ref 36.0–46.0)
Hemoglobin: 7.5 g/dL — ABNORMAL LOW (ref 12.0–15.0)
MCH: 16.3 pg — ABNORMAL LOW (ref 26.0–34.0)
MCHC: 28.3 g/dL — ABNORMAL LOW (ref 30.0–36.0)
MCV: 57.7 fL — AB (ref 78.0–100.0)
PLATELETS: 404 10*3/uL — AB (ref 150–400)
RBC: 4.59 MIL/uL (ref 3.87–5.11)
RDW: 21.8 % — ABNORMAL HIGH (ref 11.5–15.5)
WBC: 5.5 10*3/uL (ref 4.0–10.5)

## 2016-04-18 LAB — CBG MONITORING, ED: GLUCOSE-CAPILLARY: 113 mg/dL — AB (ref 65–99)

## 2016-04-18 MED ORDER — SODIUM CHLORIDE 0.9 % IV SOLN
510.0000 mg | Freq: Once | INTRAVENOUS | Status: AC
  Administered 2016-04-18: 510 mg via INTRAVENOUS
  Filled 2016-04-18: qty 17

## 2016-04-18 NOTE — ED Notes (Signed)
Pt st's she is hungry and requesting something to eat.  MD made aware

## 2016-04-18 NOTE — ED Notes (Signed)
Pt up to bathroom without any problems.  Ambulatory back to room

## 2016-04-18 NOTE — ED Provider Notes (Signed)
  Physical Exam  BP 118/69 mmHg  Pulse 66  Temp(Src) 98.7 F (37.1 C) (Oral)  Resp 16  SpO2 100%  LMP 04/18/2016  Physical Exam  ED Course  Procedures  MDM Hx of menorrhagia, now symptomatic anemia. Stopped OCPs last week due to headaches.   Getting iron infusion and is going home afterwords.     6:57 PM Patient is received her iron, stable for discharge.  She will follow-up with OB/GYN ASAP.  Erskine Emeryhris [name] Folker, MD 04/18/16 1857  Lyndal Pulleyaniel Knott, MD 04/19/16 0730

## 2016-04-18 NOTE — ED Notes (Signed)
Pt eating [place] sandwich and drinking a sprite.  No complaints voiced at this time

## 2016-04-18 NOTE — ED Provider Notes (Signed)
CSN: [id number]     Arrival date & time 04/18/16  1048 History   First MD Initiated Contact with Patient 04/18/16 1242     Chief Complaint  Patient presents with  . Weakness    generalized  . Fatigue     (Consider location/radiation/quality/duration/timing/severity/associated sxs/prior Treatment) HPI 25 y.o. female with a hx of menorrhagia, and iron deficiency anemia presents to the ED noting the onset of her menses approximately 1 week prior with similar very heavy vaginal bleeding without any noted vaginal discharge or vaginal pain aside from menstrual cramping similar to prior. She has a hx of significant anemia and has required transfusion in the past. She states that she was taking an OCP until last Friday but stopped because she was getting intermittent headaches. She states that she does not follow with an OBGYN currently. She states that over the last one to two days she has been noticing her left leg falling asleep occasionally. The patient is morbidly obese and states that these sx only occur when she has been sitting in one place for an extended period of time. She stated that her paresthesia sx in her leg would spontaneously resolve with movement and currently she denies any sx. She denies any syncope, chest pain, SOB, N, V, light-headedness or palpitations. She states that she usually is compliant with her rx'd iron supplementation.   Past Medical History  Diagnosis Date  . Asthma   . Exogenous obesity   . Poor sleep hygiene   . 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 Narcolepsy cataplexy syndrome 2007    Prior sleep studies starting on September 26 2005, AHI 5.3 per hour,  . Clotting disorder [facility])    Past Surgical History  Procedure Laterality Date  . None     Family History  Problem Relation Age of Onset  . Hypertension Mother   . Asthma Mother   . Alcohol abuse Father   .  Asthma Brother   . Alcohol abuse Maternal Grandmother    Social History  Substance Use Topics  . Smoking status: Never Smoker   . Smokeless tobacco: None  . Alcohol Use: Yes     Comment: ocassional   OB History    No data available     Review of Systems  Constitutional: Positive for activity change and fatigue. Negative for fever and chills.  HENT: Negative for congestion, rhinorrhea and sinus pressure.   Eyes: Negative for visual disturbance.  Respiratory: Negative for cough, chest tightness and shortness of breath.   Cardiovascular: Negative for chest pain and palpitations.  Gastrointestinal: Negative for nausea, vomiting, abdominal pain, diarrhea and blood in stool.  Genitourinary: Positive for vaginal bleeding and menstrual problem. Negative for dysuria, frequency, hematuria, flank pain, vaginal discharge and vaginal pain.  Musculoskeletal: Negative for back pain and neck pain.  Skin: Negative for rash.  Neurological: Positive for numbness (intermitteny leg paresthesias when sitting for long periods of time, 'Leg falling asleep"). Negative for dizziness, tremors, seizures, syncope, weakness, light-headedness and headaches.  All other systems reviewed and are negative.     Allergies  Banana  Home Medications   Prior to Admission medications   Medication Sig Start Date End Date Taking? Authorizing Provider  ferrous sulfate 325 (65 FE) MG tablet Take 1 tablet (325 mg total) by mouth 3 (three) times daily with meals. 03/22/16  Yes Dessa Phi, MD  norgestimate-ethinyl estradiol (  SPRINTEC 28) 0.25-35 MG-MCG tablet Take 1 tablet by mouth daily. 03/22/16  Yes Josalyn Funches, MD  omeprazole (PRILOSEC) 20 MG capsule Take 1 capsule (20 mg total) by mouth daily before supper. 03/22/16  Yes Josalyn Funches, MD  fluticasone (FLONASE) 50 MCG/ACT nasal spray Place 2 sprays into both nostrils daily. 03/25/16   Josalyn Funches, MD  methylphenidate (RITALIN) 5 MG tablet Take 1 tablet (5 mg  total) by mouth 2 (two) times daily with breakfast and lunch. For one week then 10 mg twice daily Patient not taking: Reported on 04/12/2016 03/22/16   Josalyn Funches, MD   BP 132/72 mmHg  Pulse 79  Temp(Src) 98.7 F (37.1 C) (Oral)  Resp 17  SpO2 100%  LMP 04/18/2016 Physical Exam  Constitutional: She is oriented to person, place, and time. She appears well-developed and well-nourished. No distress.  HENT:  Head: Normocephalic and atraumatic.  Nose: Nose normal.  Mouth/Throat: Oropharynx is clear and moist.  Eyes: Conjunctivae and EOM are normal. Pupils are equal, round, and reactive to light.  Neck: Neck supple.  Cardiovascular: Normal rate, regular rhythm, normal heart sounds and intact distal pulses.   Pulmonary/Chest: Effort normal and breath sounds normal.  Abdominal: Soft. She exhibits no distension. There is no tenderness.  Musculoskeletal: She exhibits no edema or tenderness.  Neurological: She is alert and oriented to person, place, and time. She has normal strength. No cranial nerve deficit or sensory deficit. Coordination normal. GCS eye subscore is 4. GCS verbal subscore is 5. GCS motor subscore is 6.  Skin: Skin is warm and dry. She is not diaphoretic.  Nursing note and vitals reviewed.   ED Course  Procedures (including critical care time) Labs Review Labs Reviewed  BASIC METABOLIC PANEL - Abnormal; Notable for the following:    Glucose, Bld 112 (*)    All other components within normal limits  CBC - Abnormal; Notable for the following:    Hemoglobin 7.5 (*)    HCT 26.5 (*)    MCV 57.7 (*)    MCH 16.3 (*)    MCHC 28.3 (*)    RDW 21.8 (*)    Platelets 404 (*)    All other components within normal limits  URINALYSIS, ROUTINE W REFLEX MICROSCOPIC (NOT AT [facility]) - Abnormal; Notable for the following:    Hgb urine dipstick TRACE (*)    All other components within normal limits  URINE MICROSCOPIC-ADD ON - Abnormal; Notable for the following:    Squamous Epithelial  / LPF 0-5 (*)    Bacteria, UA FEW (*)    All other components within normal limits  CBG MONITORING, ED - Abnormal; Notable for the following:    Glucose-Capillary 113 (*)    All other components within normal limits    Imaging Review No results found. I have personally reviewed and evaluated these images and lab results as part of my medical decision-making.   EKG Interpretation None      MDM  25 y.o. female with a hx of menorrhagia and anemia currently on her period who has not been taking OCP x 1 week due to intermittent headache sx presents to the ED noting intermittent leg falling asleep, fatigue and to follow up her blood work which was done as an outpatient. On exam the patient is AF with VSS sitting in bed in NAD. Neurologically intact, as above. EKG shows NSR with normal intervals and no acute ST or t-wave abnormalities suggestive of ischemia. Significant conjunctival pallor noted. Labs were  drawn and were significant for anemia with a hg of 7.5, similar to previous measures that she has had recently with microcytic anemia suggestive likely of iron deficiency. She was given a dose of IV iron supplementation in the ED and was recommended to follow up closely with [facility] tomorrow for further evaluation of her anemia and menorrhagia. This plan was discussed with the patient at the bedside and she stated both understanding and agreement with this plan.    Final diagnoses:  Iron deficiency anemia due to chronic blood loss      Francoise Ceo, DO 04/18/16 2126  Lyndal Pulley, MD 04/19/16 [phone]

## 2016-04-18 NOTE — ED Notes (Signed)
C/o feeling tired, states [facility] And had her blood drawn hasn't been able to get the results. States she also has periods of where her feet go numb.

## 2016-04-18 NOTE — ED Notes (Signed)
Patient states she had low hemoglobin last month and has been on iron since 04/12/16.   Patient states had blood done last week and hasn't received results back and wanted the results.   Patient states has overall weakness and fatigue since last month when her blood was low.   [facility], but couldn't get back in to see them today.

## 2016-04-18 NOTE — Discharge Instructions (Signed)
Iron Deficiency Anemia, Adult Anemia is when you have a low number of healthy red blood cells. It is often caused by too little iron. This is called iron deficiency anemia. It may make you tired and short of breath. HOME CARE   Take iron as told by your doctor.  Take vitamins as told by your doctor.  Eat foods that have iron in them. This includes liver, lean beef, whole-grain bread, eggs, dried fruit, and dark green leafy vegetables. GET HELP RIGHT AWAY IF:  You pass out (faint).  You have chest pain.  You feel sick to your stomach (nauseous) or throw up (vomit).  You get very short of breath with activity.  You are weak.  You have a fast heartbeat.  You start to sweat for no reason.  You become light-headed when getting up from a chair or bed. MAKE SURE YOU:  Understand these instructions.  Will watch your condition.  Will get help right away if you are not doing well or get worse.   This information is not intended to replace advice given to you by your health care provider. Make sure you discuss any questions you have with your health care provider.   Document Released: 01/04/2011 Document Revised: 12/23/2014 Document Reviewed: 08/09/2013 Elsevier Interactive Patient Education 2016 Elsevier Inc.  

## 2016-04-19 ENCOUNTER — Telehealth: Payer: Self-pay | Admitting: Family Medicine

## 2016-04-19 NOTE — Telephone Encounter (Signed)
Patient is requesting a new rx cpac  Mask please, call patient  Thank you

## 2016-04-23 NOTE — Telephone Encounter (Signed)
Rx for CPAP mask ready for pick up or if patient prefers it can be mailed

## 2016-04-23 NOTE — Telephone Encounter (Signed)
-----   Message from Dessa PhiJosalyn Funches, MD sent at 04/23/2016  8:14 AM EDT ----- Hgb improved to 78 Iron is still very low Be sure to take oral iron with vit C (like vit C tablet or small glass of low sugar orange juice) I will work on getting in an IV iron infusion

## 2016-04-23 NOTE — Telephone Encounter (Signed)
Date of birth verified by pt  Lab results given  Advised pt to take Iron daily as prescribed.  Pt stated had IV iron infusion at  ER visit  Rx CPAP mail   Per pt [facility] [address] [facility]  [phone]

## 2016-04-23 NOTE — Telephone Encounter (Addendum)
Labs have been resulted  Hgb improved to 7.8 Iron is still very low Be sure to take oral iron with vit C (like vit C tablet or small glass of low sugar orange juice) I will work on getting in an IV iron infusion

## 2016-05-02 MED FILL — ?OMEPRAZOLE DR 20 MG CAPSUL: 20 | 30 days supply | Qty: 30 | Fill #1

## 2016-05-02 MED FILL — FLUTICASONE PROP 50 MCG SPR: 50 | 27 days supply | Qty: 16 | Fill #0

## 2016-05-03 ENCOUNTER — Ambulatory Visit: Payer: Self-pay | Admitting: Family Medicine

## 2016-05-05 ENCOUNTER — Encounter (HOSPITAL_COMMUNITY): Payer: Self-pay

## 2016-05-05 DIAGNOSIS — Z8742 Personal history of other diseases of the female genital tract: Secondary | ICD-10-CM | POA: Insufficient documentation

## 2016-05-05 DIAGNOSIS — Z7951 Long term (current) use of inhaled steroids: Secondary | ICD-10-CM | POA: Insufficient documentation

## 2016-05-05 DIAGNOSIS — J45909 Unspecified asthma, uncomplicated: Secondary | ICD-10-CM | POA: Insufficient documentation

## 2016-05-05 DIAGNOSIS — Z3202 Encounter for pregnancy test, result negative: Secondary | ICD-10-CM | POA: Insufficient documentation

## 2016-05-05 DIAGNOSIS — L03116 Cellulitis of left lower limb: Secondary | ICD-10-CM | POA: Insufficient documentation

## 2016-05-05 DIAGNOSIS — Z793 Long term (current) use of hormonal contraceptives: Secondary | ICD-10-CM | POA: Insufficient documentation

## 2016-05-05 DIAGNOSIS — D72829 Elevated white blood cell count, unspecified: Secondary | ICD-10-CM | POA: Insufficient documentation

## 2016-05-05 DIAGNOSIS — G47411 Narcolepsy with cataplexy: Secondary | ICD-10-CM | POA: Insufficient documentation

## 2016-05-05 DIAGNOSIS — D509 Iron deficiency anemia, unspecified: Secondary | ICD-10-CM | POA: Insufficient documentation

## 2016-05-05 DIAGNOSIS — Z79899 Other long term (current) drug therapy: Secondary | ICD-10-CM | POA: Insufficient documentation

## 2016-05-05 NOTE — ED Notes (Signed)
Patient complains of bilateral leg swelling over the past week.  States she feels like her legs are burning. Ambulatory to triage. Patient A&Ox4

## 2016-05-06 ENCOUNTER — Ambulatory Visit (HOSPITAL_COMMUNITY): Payer: Self-pay

## 2016-05-06 ENCOUNTER — Emergency Department (HOSPITAL_COMMUNITY)
Admission: EM | Admit: 2016-05-06 | Discharge: 2016-05-06 | Disposition: A | Discharge status: Home / Self Care | Payer: Self-pay | Attending: Emergency Medicine | Admitting: Emergency Medicine

## 2016-05-06 ENCOUNTER — Encounter (HOSPITAL_COMMUNITY): Payer: Self-pay

## 2016-05-06 ENCOUNTER — Ambulatory Visit (HOSPITAL_COMMUNITY)
Admission: RE | Admit: 2016-05-06 | Discharge: 2016-05-06 | Disposition: A | Discharge status: Home / Self Care | Payer: Self-pay | Source: Ambulatory Visit | Attending: Emergency Medicine | Admitting: Emergency Medicine

## 2016-05-06 DIAGNOSIS — M7989 Other specified soft tissue disorders: Secondary | ICD-10-CM | POA: Insufficient documentation

## 2016-05-06 DIAGNOSIS — L03116 Cellulitis of left lower limb: Secondary | ICD-10-CM

## 2016-05-06 DIAGNOSIS — E669 Obesity, unspecified: Secondary | ICD-10-CM | POA: Insufficient documentation

## 2016-05-06 LAB — BASIC METABOLIC PANEL
Anion gap: 5 (ref 5–15)
BUN: 10 mg/dL (ref 6–20)
CALCIUM: 8.9 mg/dL (ref 8.9–10.3)
CHLORIDE: 104 mmol/L (ref 101–111)
CO2: 25 mmol/L (ref 22–32)
CREATININE: 0.69 mg/dL (ref 0.44–1.00)
GFR calc Af Amer: >60 mL/min (ref >60)
GFR calc non Af Amer: >60 mL/min (ref >60)
GLUCOSE: 101 mg/dL — AB (ref 65–99)
Potassium: 3.9 mmol/L (ref 3.5–5.1)
Sodium: 134 mmol/L — ABNORMAL LOW (ref 135–145)

## 2016-05-06 LAB — CBC WITH DIFFERENTIAL/PLATELET
BASOS ABS: 0.1 10*3/uL (ref 0.0–0.1)
BASOS PCT: 1 %
EOS PCT: 2 %
Eosinophils Absolute: 0.1 10*3/uL (ref 0.0–0.7)
HEMATOCRIT: 29.5 % — AB (ref 36.0–46.0)
HEMOGLOBIN: 8.5 g/dL — AB (ref 12.0–15.0)
LYMPHS ABS: 2 10*3/uL (ref 0.7–4.0)
Lymphocytes Relative: 27 %
MCH: 17.6 pg — AB (ref 26.0–34.0)
MCHC: 28.8 g/dL — AB (ref 30.0–36.0)
MCV: 61.1 fL — AB (ref 78.0–100.0)
MONOS PCT: 11 %
Monocytes Absolute: 0.8 10*3/uL (ref 0.1–1.0)
NEUTROS ABS: 4.4 10*3/uL (ref 1.7–7.7)
Neutrophils Relative %: 59 %
Platelets: 309 10*3/uL (ref 150–400)
RBC: 4.83 MIL/uL (ref 3.87–5.11)
RDW: 26.8 % — AB (ref 11.5–15.5)
WBC: 7.4 10*3/uL (ref 4.0–10.5)

## 2016-05-06 LAB — I-STAT BETA HCG BLOOD, ED (MC, WL, AP ONLY): I-stat hCG, quantitative: <5 m[IU]/mL (ref <5)

## 2016-05-06 MED ORDER — SULFAMETHOXAZOLE-TRIMETHOPRIM 800-160 MG PO TABS: 1.0000 | ORAL_TABLET | Freq: Two times a day (BID) | ORAL | Status: AC

## 2016-05-06 MED ORDER — FUROSEMIDE 10 MG/ML IJ SOLN
40.0000 mg | Freq: Once | INTRAMUSCULAR | Status: AC
  Administered 2016-05-06: 40 mg via INTRAMUSCULAR
  Filled 2016-05-06: qty 4

## 2016-05-06 MED ORDER — IBUPROFEN 800 MG PO TABS: 800.0000 mg | ORAL_TABLET | Freq: Three times a day (TID) | ORAL | Status: DC | PRN

## 2016-05-06 MED ORDER — SULFAMETHOXAZOLE-TRIMETHOPRIM 800-160 MG PO TABS
1.0000 | ORAL_TABLET | Freq: Once | ORAL | Status: AC
  Administered 2016-05-06: 1 via ORAL
  Filled 2016-05-06: qty 1

## 2016-05-06 NOTE — Progress Notes (Addendum)
VASCULAR LAB PRELIMINARY  PRELIMINARY  PRELIMINARY  PRELIMINARY  Bilateral lower extremity venous duplex completed.    Preliminary report:   Severely technically limited due to body habitus. Bilateral:  No obvious evidence of DVT or superficial thrombosis of the vessels imaged, or Baker's Cyst.   Kyvon Hu, RVS 05/06/2016, 5:05 PM

## 2016-05-06 NOTE — ED Provider Notes (Signed)
By signing my name below, I, Doreatha Martinva Mathews, attest that this documentation has been prepared under the direction and in the presence of Kenyatta Gloeckner N Tamia Dial, DO. Electronically Signed: Doreatha MartinEva Mathews, ED Scribe. 05/06/2016. 2:08 AM.   TIME SEEN: 2:00 AM   CHIEF COMPLAINT:  Chief Complaint  Patient presents with  . Leg Swelling    bilateral     HPI:  HPI Comments: [name] is a 25 y.o. female with h/o morbid obesity who presents to the Emergency Department complaining of bilateral lower leg swelling (L>R) onset a week ago with associated burning pain. Pt denies taking OTC medications at home to improve symptoms. No worsening or alleviating factors noted. Per pt, she had 2 days of diarrhea last week, but this has currently resolved. No h/o DM or cellulitis. No recent surgery, prolonged travel. No h/o PE/DVT. Not on exogenous estrogen. She denies fever, CP, SOB, emesis, diarrhea.  Per pt, she has previously had similar leg swelling and has taken Lasix in the past.   ROS: See HPI Constitutional: no fever  Eyes: no drainage  ENT: no runny nose   Cardiovascular:  no chest pain  Resp: no SOB  GI: no vomiting GU: no dysuria Integumentary: no rash  Allergy: no hives  Musculoskeletal: Positive for leg swelling (L>R), burning leg pain  Neurological: no slurred speech ROS otherwise negative  PAST MEDICAL HISTORY/PAST SURGICAL HISTORY:  Past Medical History  Diagnosis Date  . Asthma   . Exogenous obesity   . Poor sleep hygiene   . 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 Narcolepsy cataplexy syndrome 2007    Prior sleep studies starting on September 26 2005, AHI 5.3 per hour,  . Clotting disorder (HCC)     MEDICATIONS:  Prior to Admission medications   Medication Sig Start Date End Date Taking? Authorizing Provider  ferrous sulfate 325 (65 FE) MG tablet Take 1 tablet (325 mg total) by mouth 3  (three) times daily with meals. 03/22/16   Josalyn Funches, MD  fluticasone (FLONASE) 50 MCG/ACT nasal spray Place 2 sprays into both nostrils daily. 03/25/16   Josalyn Funches, MD  methylphenidate (RITALIN) 5 MG tablet Take 1 tablet (5 mg total) by mouth 2 (two) times daily with breakfast and lunch. For one week then 10 mg twice daily Patient not taking: Reported on 04/12/2016 03/22/16   Dessa PhiJosalyn Funches, MD  norgestimate-ethinyl estradiol (SPRINTEC 28) 0.25-35 MG-MCG tablet Take 1 tablet by mouth daily. 03/22/16   Josalyn Funches, MD  omeprazole (PRILOSEC) 20 MG capsule Take 1 capsule (20 mg total) by mouth daily before supper. 03/22/16   Dessa PhiJosalyn Funches, MD    ALLERGIES:  Allergies  Allergen Reactions  . Banana Itching    SOCIAL HISTORY:  Social History  Substance Use Topics  . Smoking status: Never Smoker   . Smokeless tobacco: Not on file  . Alcohol Use: Yes     Comment: ocassional    FAMILY HISTORY: Family History  Problem Relation Age of Onset  . Hypertension Mother   . Asthma Mother   . Alcohol abuse Father   . Asthma Brother   . Alcohol abuse Maternal Grandmother     EXAM: BP 131/78 mmHg  Pulse 93  Temp(Src) 98.2 F (36.8 C) (Oral)  Resp 16  SpO2 100%  LMP 04/18/2016 CONSTITUTIONAL: Alert and oriented and responds appropriately to questions. Well-appearing; well-nourished. Morbid obesity. Afebrile. Nontoxic. No  distress.  HEAD: Normocephalic EYES: Conjunctivae clear, PERRL ENT: normal nose; no rhinorrhea; moist mucous membranes NECK: Supple, no meningismus, no LAD  CARD: RRR; S1 and S2 appreciated; no murmurs, no clicks, no rubs, no gallops RESP: Normal chest excursion without splinting or tachypnea; breath sounds clear and equal bilaterally; no wheezes, no rhonchi, no rales, no hypoxia or respiratory distress, speaking full sentences ABD/GI: Normal bowel sounds; non-distended; soft, non-tender, no rebound, no guarding, no peritoneal signs BACK:  The back appears normal  and is non-tender to palpation, there is no CVA tenderness EXT: Normal ROM in all joints; non-tender to palpation; normal capillary refill; no cyanosis, no calf tenderness; no pitting edema. Difficult to assess if pt has significant swelling d/t morbid obesity. Good perfusion of the lower extremities. 2+ DP pulses bilaterally. Area of erythema, warmth and induration without fluctuance to the inner left thigh. Compartments soft. No joint effusion.  SKIN: Normal color for age and race; warm; no rash NEURO: Moves all extremities equally, sensation to light touch intact diffusely, cranial nerves II through XII intact PSYCH: The patient's mood and manner are appropriate. Grooming and personal hygiene are appropriate.  MEDICAL DECISION MAKING: Patient here with bilateral lower extremity swelling worse on the left side than the right. May have a small area of cellulitis to the inner left thigh but no sign of abscess. No systemic symptoms. Denies chest pain or shortness of breath. Lungs are clear to auscultation with good aeration and no hypoxia. Doubt CHF. I do feel she needs venous Dopplers in the morning to rule out DVT. Will hold on therapeutic Lovenox at this time however given she has a history of menorrhagia and has had iron deficiency anemia and given she will have the ultrasound in several hours from now. We'll give antibiotics, Lasix. Labs pending.  ED PROGRESS: Patient's labs showed a leukocytosis. She has chronic anemia which is improving. Hemoglobin today is 8.5. Normal electrolytes, kidney function. She is not pregnant. We'll discharge home on Bactrim. Have advised her to keep her legs elevated at all times at rest. Will order bilateral venous Doppler of her lower extremities to be performed in the morning. Discussed return precautions. She declines pain medication. She verbalizes understanding is comfortable with this plan.    At this time, I do not feel there is any life-threatening condition  present. I have reviewed and discussed all results (EKG, imaging, lab, urine as appropriate), exam findings with patient. I have reviewed nursing notes and appropriate previous records.  I feel the patient is safe to be discharged home without further emergent workup. Discussed usual and customary return precautions. Patient and family (if present) verbalize understanding and are comfortable with this plan.  Patient will follow-up with their primary care provider. If they do not have a primary care provider, information for follow-up has been provided to them. All questions have been answered.      I personally performed the services described in this documentation, which was scribed in my presence. The recorded information has been reviewed and is accurate.   Layla Maw Jahmil Macleod, DO 05/06/16 0300

## 2016-05-06 NOTE — Discharge Instructions (Signed)
Edema °Edema is an abnormal buildup of fluids in your body tissues. Edema is somewhat dependent on gravity to pull the fluid to the lowest place in your body. That makes the condition more common in the legs and thighs (lower extremities). Painless swelling of the feet and ankles is common and becomes more likely as you get older. It is also common in looser tissues, like around your eyes.  °When the affected area is squeezed, the fluid may move out of that spot and leave a dent for a few moments. This dent is called pitting.  °CAUSES  °There are many possible causes of edema. Eating too much salt and being on your feet or sitting for a long time can cause edema in your legs and ankles. Hot weather may make edema worse. Common medical causes of edema include: °· Heart failure. °· Liver disease. °· Kidney disease. °· Weak blood vessels in your legs. °· Cancer. °· An injury. °· Pregnancy. °· Some medications. °· Obesity.  °SYMPTOMS  °Edema is usually painless. Your skin may look swollen or shiny.  °DIAGNOSIS  °Your health care provider may be able to diagnose edema by asking about your medical history and doing a physical exam. You may need to have tests such as X-rays, an electrocardiogram, or blood tests to check for medical conditions that may cause edema.  °TREATMENT  °Edema treatment depends on the cause. If you have heart, liver, or kidney disease, you need the treatment appropriate for these conditions. General treatment may include: °· Elevation of the affected body part above the level of your heart. °· Compression of the affected body part. Pressure from elastic bandages or support stockings squeezes the tissues and forces fluid back into the blood vessels. This keeps fluid from entering the tissues. °· Restriction of fluid and salt intake. °· Use of a water pill (diuretic). These medications are appropriate only for some types of edema. They pull fluid out of your body and make you urinate more often. This  gets rid of fluid and reduces swelling, but diuretics can have side effects. Only use diuretics as directed by your health care provider. °HOME CARE INSTRUCTIONS  °· Keep the affected body part above the level of your heart when you are lying down.   °· Do not sit still or stand for prolonged periods.   °· Do not put anything directly under your knees when lying down. °· Do not wear constricting clothing or garters on your upper legs.   °· Exercise your legs to work the fluid back into your blood vessels. This may help the swelling go down.   °· Wear elastic bandages or support stockings to reduce ankle swelling as directed by your health care provider.   °· Eat a low-salt diet to reduce fluid if your health care provider recommends it.   °· Only take medicines as directed by your health care provider.  °SEEK MEDICAL CARE IF:  °· Your edema is not responding to treatment. °· You have heart, liver, or kidney disease and notice symptoms of edema. °· You have edema in your legs that does not improve after elevating them.   °· You have sudden and unexplained weight gain. °SEEK IMMEDIATE MEDICAL CARE IF:  °· You develop shortness of breath or chest pain.   °· You cannot breathe when you lie down. °· You develop pain, redness, or warmth in the swollen areas.   °· You have heart, liver, or kidney disease and suddenly get edema. °· You have a fever and your symptoms suddenly get worse. °MAKE SURE YOU:  °·   Understand these instructions.  Will watch your condition.  Will get help right away if you are not doing well or get worse.   This information is not intended to replace advice given to you by your health care provider. Make sure you discuss any questions you have with your health care provider.   Document Released: 12/02/2005 Document Revised: 12/23/2014 Document Reviewed: 09/24/2013 Elsevier Interactive Patient Education 2016 Elsevier Inc.  Cellulitis Cellulitis is an infection of the skin and the tissue  beneath it. The infected area is usually red and tender. Cellulitis occurs most often in the arms and lower legs.  CAUSES  Cellulitis is caused by bacteria that enter the skin through cracks or cuts in the skin. The most common types of bacteria that cause cellulitis are staphylococci and streptococci. SIGNS AND SYMPTOMS   Redness and warmth.  Swelling.  Tenderness or pain.  Fever. DIAGNOSIS  Your health care provider can usually determine what is wrong based on a physical exam. Blood tests may also be done. TREATMENT  Treatment usually involves taking an antibiotic medicine. HOME CARE INSTRUCTIONS   Take your antibiotic medicine as directed by your health care provider. Finish the antibiotic even if you start to feel better.  Keep the infected arm or leg elevated to reduce swelling.  Apply a warm cloth to the affected area up to 4 times per day to relieve pain.  Take medicines only as directed by your health care provider.  Keep all follow-up visits as directed by your health care provider. SEEK MEDICAL CARE IF:   You notice red streaks coming from the infected area.  Your red area gets larger or turns dark in color.  Your bone or joint underneath the infected area becomes painful after the skin has healed.  Your infection returns in the same area or another area.  You notice a swollen bump in the infected area.  You develop new symptoms.  You have a fever. SEEK IMMEDIATE MEDICAL CARE IF:   You feel very sleepy.  You develop vomiting or diarrhea.  You have a general ill feeling (malaise) with muscle aches and pains.   This information is not intended to replace advice given to you by your health care provider. Make sure you discuss any questions you have with your health care provider.   Document Released: 09/11/2005 Document Revised: 08/23/2015 Document Reviewed: 02/17/2012 Elsevier Interactive Patient Education [employer]

## 2016-05-08 ENCOUNTER — Encounter: Payer: Self-pay | Admitting: Obstetrics & Gynecology

## 2016-05-24 ENCOUNTER — Encounter: Payer: Self-pay | Admitting: Family Medicine

## 2016-05-24 ENCOUNTER — Ambulatory Visit: Payer: Self-pay | Attending: Family Medicine | Admitting: Family Medicine

## 2016-05-24 VITALS — BP 112/73 | HR 97 | Temp 98.7°F | Resp 18 | Ht 63.0 in | Wt 390.0 lb

## 2016-05-24 DIAGNOSIS — N920 Excessive and frequent menstruation with regular cycle: Secondary | ICD-10-CM

## 2016-05-24 DIAGNOSIS — K219 Gastro-esophageal reflux disease without esophagitis: Secondary | ICD-10-CM | POA: Insufficient documentation

## 2016-05-24 DIAGNOSIS — I8311 Varicose veins of right lower extremity with inflammation: Secondary | ICD-10-CM

## 2016-05-24 DIAGNOSIS — I8312 Varicose veins of left lower extremity with inflammation: Secondary | ICD-10-CM

## 2016-05-24 DIAGNOSIS — D509 Iron deficiency anemia, unspecified: Secondary | ICD-10-CM

## 2016-05-24 DIAGNOSIS — I872 Venous insufficiency (chronic) (peripheral): Secondary | ICD-10-CM | POA: Insufficient documentation

## 2016-05-24 DIAGNOSIS — Z79899 Other long term (current) drug therapy: Secondary | ICD-10-CM | POA: Insufficient documentation

## 2016-05-24 DIAGNOSIS — G47419 Narcolepsy without cataplexy: Secondary | ICD-10-CM | POA: Insufficient documentation

## 2016-05-24 MED ORDER — MEDICAL COMPRESSION SOCKS MISC: 1.0000 | Freq: Every day | Status: DC

## 2016-05-24 MED ORDER — METHYLPHENIDATE HCL 5 MG PO TABS: 5.0000 mg | ORAL_TABLET | Freq: Two times a day (BID) | ORAL | Status: DC

## 2016-05-24 MED ORDER — OMEPRAZOLE 40 MG PO CPDR: 40.0000 mg | DELAYED_RELEASE_CAPSULE | Freq: Every day | ORAL | Status: DC

## 2016-05-24 MED FILL — OMEPRAZOLE DR 40 MG CAPSULE: 40 | 30 days supply | Qty: 30 | Fill #0

## 2016-05-24 MED FILL — FERROUS SULFATE 325 MG TAB: 325 (65 FE) | 30 days supply | Qty: 90 | Fill #0

## 2016-05-24 NOTE — Progress Notes (Signed)
Subjective:  Patient ID: [name], female    DOB: 1991-01-16  Age: 25 y.o. 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 ED f/u L leg cellulitis: she presented to the ED on 05/05/2016 with two weeks of bilateral leg swelling  L> R with burning pain.  She has normal WBC. Hgb has improved from 6.8 to 8.5 following IV iron. She was treated with a course bactrim for possible cellulitis. Lasix for fluid retention.  LE venous doppler obtained the next day  was negative for DVT. Today she reports improvement in pain. She still has mild swelling. She is not elevating her legs or wearing compression socks/hose.   2. Narcolepsy with cataplexy: she has not started prescribed ritalin. She reports losing the Rx provided at her OV on 03/22/16.   Social History  Substance Use Topics  . Smoking status: Never Smoker   . Smokeless tobacco: Not on file  . Alcohol Use: Yes     Comment: ocassional    Outpatient Prescriptions Prior to Visit  Medication Sig Dispense Refill  . ferrous sulfate 325 (65 FE) MG tablet Take 1 tablet (325 mg total) by mouth 3 (three) times daily with meals. 90 tablet 5  . omeprazole (PRILOSEC) 20 MG capsule Take 1 capsule (20 mg total) by mouth daily before supper. 30 capsule 5  . fluticasone (FLONASE) 50 MCG/ACT nasal spray Place 2 sprays into both nostrils daily. (Patient not taking: Reported on 05/24/2016) 16 g 6  . ibuprofen (ADVIL,MOTRIN) 800 MG tablet Take 1 tablet (800 mg total) by mouth every 8 (eight) hours as needed for mild pain. (Patient not taking: Reported on 05/24/2016) 30 tablet 0   No facility-administered medications prior to visit.    ROS Review of Systems  Constitutional: Positive for fatigue. Negative for fever and chills.  Cardiovascular: Positive for leg swelling.  Skin:  Negative for rash.  Psychiatric/Behavioral: Positive for sleep disturbance. Negative for suicidal ideas.    Objective:  BP 112/73 mmHg  Pulse 97  Temp(Src) 98.7 F (37.1 C) (Oral)  Resp 18  Ht  (1.6 m)  Wt 390 lb (176.903 kg)  BMI 69.10 kg/m2  SpO2 99%  LMP 05/13/2016  BP/Weight 05/24/2016 05/06/2016 04/18/2016  Systolic BP 112 132 132  Diastolic BP 73 74 72  Wt. (Lbs) 390 - -  BMI 69.1 - -    Physical Exam  Constitutional: She is oriented to person, place, and time. She appears well-developed and well-nourished. No distress.  Obese   HENT:  Head: Normocephalic and atraumatic.  Cardiovascular: Normal rate, regular rhythm, normal heart sounds and intact distal pulses.   Pulmonary/Chest: Effort normal and breath sounds normal.  Musculoskeletal: She exhibits edema (L >R trace in legs ).  Neurological: She is alert and oriented to person, place, and time.  Skin: Skin is warm and dry. No rash noted.  Psychiatric: She has a normal mood and affect.     Assessment & Plan:   There are no diagnoses linked to this encounter. [name] was seen today for hospitalization follow-up.  Diagnoses and all orders for this visit:  Gastroesophageal reflux disease, esophagitis presence not specified -     omeprazole (PRILOSEC) 40 MG capsule; Take 1 capsule (40 mg total) by mouth daily before supper.  NARCOLEPSY W/O CATAPLEXY -  methylphenidate (RITALIN) 5 MG tablet; Take 1 tablet (5 mg total) by mouth 2 (two) times daily with breakfast and lunch.  Venous stasis dermatitis of both lower extremities -     Elastic Bandages & Supports (MEDICAL COMPRESSION SOCKS) MISC; 1 each by Does not apply route daily. 30 mmHg pressure   Meds ordered this encounter  Medications  . omeprazole (PRILOSEC) 40 MG capsule    Sig: Take 1 capsule (40 mg total) by mouth daily before supper.    Dispense:  30 capsule    Refill:  5  . methylphenidate (RITALIN) 5 MG tablet    Sig: Take 1 tablet (5 mg  total) by mouth 2 (two) times daily with breakfast and lunch.    Dispense:  60 tablet    Refill:  0    Follow-up: No Follow-up on file.   Dessa PhiJosalyn Lovely Kerins MD

## 2016-05-24 NOTE — Patient Instructions (Addendum)
[name] was seen today for hospitalization follow-up.  Diagnoses and all orders for this visit:  Gastroesophageal reflux disease, esophagitis presence not specified -     omeprazole (PRILOSEC) 40 MG capsule; Take 1 capsule (40 mg total) by mouth daily before supper.  NARCOLEPSY W/O CATAPLEXY -     methylphenidate (RITALIN) 5 MG tablet; Take 1 tablet (5 mg total) by mouth 2 (two) times daily with breakfast and lunch.  Venous stasis dermatitis of both lower extremities -     Elastic Bandages & Supports (MEDICAL COMPRESSION SOCKS) MISC; 1 each by Does not apply route daily. 30 mmHg pressure  Microcytic anemia -     Ambulatory referral to Gynecology  MENORRHAGIA -     Ambulatory referral to Gynecology   There is no evidence of infection in legs Elevate legs above heart Eat a low salt diet  Work on losing weight   F/u in 4 weeks for narcolepsy re-starting ritalin  Dr. Armen PickupFunches

## 2016-05-24 NOTE — Progress Notes (Signed)
HFU cellulitis on left leg  Pt stated leg burning, finish round of antibiotic  Pain scale # 0 No tobacco user No suicidal thoughts in the past two weeks

## 2016-05-26 ENCOUNTER — Encounter: Payer: Self-pay | Admitting: Family Medicine

## 2016-05-26 DIAGNOSIS — I872 Venous insufficiency (chronic) (peripheral): Secondary | ICD-10-CM | POA: Insufficient documentation

## 2016-05-26 NOTE — Assessment & Plan Note (Addendum)
B/l LE venous stasis in obese patient with anemia  Plan: Compression Elevation Continue iron therapy Gyn referral for persistent menorrhagia and anemia

## 2016-06-08 ENCOUNTER — Emergency Department (HOSPITAL_COMMUNITY)
Admission: EM | Admit: 2016-06-08 | Discharge: 2016-06-08 | Disposition: A | Discharge status: Home / Self Care | Payer: Self-pay | Attending: Emergency Medicine | Admitting: Emergency Medicine

## 2016-06-08 ENCOUNTER — Encounter (HOSPITAL_COMMUNITY): Payer: Self-pay

## 2016-06-08 DIAGNOSIS — R519 Headache, unspecified: Secondary | ICD-10-CM

## 2016-06-08 DIAGNOSIS — Z79899 Other long term (current) drug therapy: Secondary | ICD-10-CM | POA: Insufficient documentation

## 2016-06-08 DIAGNOSIS — R600 Localized edema: Secondary | ICD-10-CM | POA: Insufficient documentation

## 2016-06-08 DIAGNOSIS — Z791 Long term (current) use of non-steroidal anti-inflammatories (NSAID): Secondary | ICD-10-CM | POA: Insufficient documentation

## 2016-06-08 DIAGNOSIS — R51 Headache: Secondary | ICD-10-CM | POA: Insufficient documentation

## 2016-06-08 DIAGNOSIS — J45909 Unspecified asthma, uncomplicated: Secondary | ICD-10-CM | POA: Insufficient documentation

## 2016-06-08 LAB — I-STAT BETA HCG BLOOD, ED (MC, WL, AP ONLY)

## 2016-06-08 LAB — CBC WITH DIFFERENTIAL/PLATELET
BASOS PCT: 1 %
Basophils Absolute: 0.1 10*3/uL (ref 0.0–0.1)
EOS ABS: 0.1 10*3/uL (ref 0.0–0.7)
EOS PCT: 2 %
HEMATOCRIT: 28.7 % — AB (ref 36.0–46.0)
HEMOGLOBIN: 8.6 g/dL — AB (ref 12.0–15.0)
LYMPHS PCT: 27 %
Lymphs Abs: 1.8 10*3/uL (ref 0.7–4.0)
MCH: 18.9 pg — AB (ref 26.0–34.0)
MCHC: 30 g/dL (ref 30.0–36.0)
MCV: 63.1 fL — AB (ref 78.0–100.0)
Monocytes Absolute: 0.7 10*3/uL (ref 0.1–1.0)
Monocytes Relative: 11 %
NEUTROS ABS: 4 10*3/uL (ref 1.7–7.7)
NEUTROS PCT: 59 %
Platelets: 298 10*3/uL (ref 150–400)
RBC: 4.55 MIL/uL (ref 3.87–5.11)
RDW: 24.9 % — ABNORMAL HIGH (ref 11.5–15.5)
WBC: 6.7 10*3/uL (ref 4.0–10.5)

## 2016-06-08 LAB — COMPREHENSIVE METABOLIC PANEL
ALK PHOS: 89 U/L (ref 38–126)
ALT: 17 U/L (ref 14–54)
ANION GAP: 5 (ref 5–15)
AST: 14 U/L — ABNORMAL LOW (ref 15–41)
Albumin: 2.5 g/dL — ABNORMAL LOW (ref 3.5–5.0)
BILIRUBIN TOTAL: 0.5 mg/dL (ref 0.3–1.2)
BUN: 7 mg/dL (ref 6–20)
CALCIUM: 8.3 mg/dL — AB (ref 8.9–10.3)
CO2: 23 mmol/L (ref 22–32)
CREATININE: 0.53 mg/dL (ref 0.44–1.00)
Chloride: 109 mmol/L (ref 101–111)
Glucose, Bld: 96 mg/dL (ref 65–99)
Potassium: 3.5 mmol/L (ref 3.5–5.1)
SODIUM: 137 mmol/L (ref 135–145)
TOTAL PROTEIN: 5.8 g/dL — AB (ref 6.5–8.1)

## 2016-06-08 LAB — LIPASE, BLOOD: LIPASE: 13 U/L (ref 11–51)

## 2016-06-08 MED ORDER — DIPHENHYDRAMINE HCL 25 MG PO CAPS
25.0000 mg | ORAL_CAPSULE | Freq: Once | ORAL | Status: DC
  Filled 2016-06-08: qty 1

## 2016-06-08 MED ORDER — DIPHENHYDRAMINE HCL 50 MG/ML IJ SOLN
25.0000 mg | Freq: Once | INTRAMUSCULAR | Status: AC
  Administered 2016-06-08: 25 mg via INTRAVENOUS
  Filled 2016-06-08: qty 1

## 2016-06-08 MED ORDER — SODIUM CHLORIDE 0.9 % IV BOLUS (SEPSIS)
1000.0000 mL | Freq: Once | INTRAVENOUS | Status: AC
  Administered 2016-06-08: 1000 mL via INTRAVENOUS

## 2016-06-08 MED ORDER — PROMETHAZINE HCL 25 MG/ML IJ SOLN
12.5000 mg | Freq: Once | INTRAMUSCULAR | Status: AC
  Administered 2016-06-08: 12.5 mg via INTRAVENOUS
  Filled 2016-06-08: qty 1

## 2016-06-08 MED ORDER — DEXAMETHASONE SODIUM PHOSPHATE 10 MG/ML IJ SOLN
10.0000 mg | Freq: Once | INTRAMUSCULAR | Status: AC
  Administered 2016-06-08: 10 mg via INTRAVENOUS
  Filled 2016-06-08: qty 1

## 2016-06-08 MED ORDER — SODIUM CHLORIDE 0.9 % IV SOLN: INTRAVENOUS | Status: DC

## 2016-06-08 NOTE — Discharge Instructions (Signed)
Return for any new or worse symptoms. Go home and rest. Today's labs without any significant abnormalities.

## 2016-06-08 NOTE — ED Notes (Signed)
Patient complains of frontal and left sided migraine that started yesterday, nausea and diarrhea with same. Denies trauma. Dizziness with same. Alert and oriented

## 2016-06-08 NOTE — ED Notes (Signed)
MD at bedside. 

## 2016-06-08 NOTE — ED Notes (Addendum)
The pt has had a headache for 3-4 days  She reports that she has migraine headaches  n v.  She is barely  [place] she talks in low mono toned  Voice.  She has very poor hygiene  .  She was dropped off here

## 2016-06-08 NOTE — ED Provider Notes (Addendum)
CSN: [id number]     Arrival date & time 06/08/16  1504 History   First MD Initiated Contact with Patient 06/08/16 1548     Chief Complaint  Patient presents with  . Headache     (Consider location/radiation/quality/duration/timing/severity/associated sxs/prior Treatment) Patient is a 25 y.o. female presenting with headaches. The history is provided by the patient.  Headache Associated symptoms: diarrhea, eye pain, nausea and photophobia   Associated symptoms: no abdominal pain, no back pain, no congestion, no dizziness, no fever, no neck pain, no vomiting and no weakness   Patient presents with the onset of headache that she calls migraines starting yesterday. Patient with no formal diagnosis of migraines. States she gets headaches like this and takes Excedrin they normally go away. Headache is a frontal and left sided and behind the left eye. Patient denies any visual changes however does have some light sensitivity. Associated with nausea no vomiting and also associated with diarrhea but no blood in the diarrhea. Also no fevers. Patient denies any abdominal pain. No history of any head injury recently patient denies any vertigo or dizziness. Patient's followed by the wellness clinic.  Past Medical History  Diagnosis Date  . Asthma   . Exogenous obesity   . Poor sleep hygiene   . 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 Narcolepsy cataplexy syndrome 2007    Prior sleep studies starting on September 26 2005, AHI 5.3 per hour,  . Clotting disorder [facility](HCC)    Past Surgical History  Procedure Laterality Date  . None     Family History  Problem Relation Age of Onset  . Hypertension Mother   . Asthma Mother   . Alcohol abuse Father   . Asthma Brother   . Alcohol abuse Maternal Grandmother    Social History  Substance Use Topics  . Smoking status: Never Smoker   . Smokeless tobacco: None  .  Alcohol Use: Yes     Comment: ocassional   OB History    No data available     Review of Systems  Constitutional: Negative for fever.  HENT: Negative for congestion.   Eyes: Positive for photophobia and pain.  Respiratory: Negative for shortness of breath.   Cardiovascular: Positive for leg swelling. Negative for chest pain.  Gastrointestinal: Positive for nausea and diarrhea. Negative for vomiting and abdominal pain.  Genitourinary: Negative for dysuria.  Musculoskeletal: Negative for back pain and neck pain.  Skin: Negative for rash.  Neurological: Positive for headaches. Negative for dizziness and weakness.  Hematological: Does not bruise/bleed easily.  Psychiatric/Behavioral: Negative for confusion.      Allergies  Banana  Home Medications   Prior to Admission medications   Medication Sig Start Date End Date Taking? Authorizing Provider  Elastic Bandages & Supports (MEDICAL COMPRESSION SOCKS) MISC 1 each by Does not apply route daily. 30 mmHg pressure 05/24/16   Josalyn Funches, MD  ferrous sulfate 325 (65 FE) MG tablet Take 1 tablet (325 mg total) by mouth 3 (three) times daily with meals. 03/22/16   Josalyn Funches, MD  fluticasone (FLONASE) 50 MCG/ACT nasal spray Place 2 sprays into both nostrils daily. Patient not taking: Reported on 05/24/2016 03/25/16   Dessa PhiJosalyn Funches, MD  ibuprofen (ADVIL,MOTRIN) 800 MG tablet Take 1 tablet (800 mg total) by mouth every 8 (eight) hours as needed for mild pain. Patient not taking: Reported on 05/24/2016 05/06/16   Baxter HireKristen  N Ward, DO  methylphenidate (RITALIN) 5 MG tablet Take 1 tablet (5 mg total) by mouth 2 (two) times daily with breakfast and lunch. 05/24/16   Josalyn Funches, MD  omeprazole (PRILOSEC) 40 MG capsule Take 1 capsule (40 mg total) by mouth daily before supper. 05/24/16   Josalyn Funches, MD   BP 107/58 mmHg  Pulse 79  Temp(Src) 98.5 F (36.9 C) (Oral)  Resp 18  Ht 5\' 3"  (1.6 m)  Wt 182.063 kg  BMI 71.12 kg/m2  SpO2 100%   LMP 05/13/2016 Physical Exam  Constitutional: She is oriented to person, place, and time. She appears well-developed and well-nourished. No distress.  HENT:  Head: Normocephalic and atraumatic.  Mouth/Throat: Oropharynx is clear and moist.  Eyes: Conjunctivae and EOM are normal. Pupils are equal, round, and reactive to light.  Neck: Normal range of motion. Neck supple.  Pulmonary/Chest: Effort normal and breath sounds normal.  Abdominal: Soft. Bowel sounds are normal. There is no tenderness.  Musculoskeletal: Normal range of motion. She exhibits edema.  Bilateral leg swelling with trace edema.  Neurological: She is alert and oriented to person, place, and time. No cranial nerve deficit. She exhibits normal muscle tone. Coordination normal.  Skin: Skin is warm.  Nursing note and vitals reviewed.   ED Course  Procedures (including critical care time) Labs Review Labs Reviewed  CBC WITH DIFFERENTIAL/PLATELET  COMPREHENSIVE METABOLIC PANEL  LIPASE, BLOOD    Imaging Review No results found. I have personally reviewed and evaluated these images and lab results as part of my medical decision-making.   EKG Interpretation None      MDM   Final diagnoses:  Acute nonintractable headache, unspecified headache type   Patient with persistent anemia known history of anemia. Not worse.   Patient with significant improvement with the migraine cocktail. Headache almost completely resolved. Patient now eating. No further diarrhea problems. Nausea better. Patient nontoxic no acute distress. No concerns for head bleed as the cause of the headache also no concerns for meningitis.  Vanetta MuldersScott Moon Budde, MD 06/08/16 [id number]  Vanetta MuldersScott Jalonda Antigua, MD 06/08/16 2028

## 2016-06-08 NOTE — ED Notes (Signed)
Up to the br  Her headache is still a 6/10  She is very drowsy

## 2016-07-11 ENCOUNTER — Ambulatory Visit: Payer: Self-pay | Attending: Family Medicine | Admitting: Family Medicine

## 2016-07-11 ENCOUNTER — Encounter: Payer: Self-pay | Admitting: Family Medicine

## 2016-07-11 VITALS — BP 121/74 | HR 91 | Temp 98.2°F | Resp 17 | Ht 63.0 in | Wt 399.4 lb

## 2016-07-11 DIAGNOSIS — M25562 Pain in left knee: Secondary | ICD-10-CM

## 2016-07-11 DIAGNOSIS — Z6841 Body Mass Index (BMI) 40.0 and over, adult: Secondary | ICD-10-CM

## 2016-07-11 DIAGNOSIS — G47419 Narcolepsy without cataplexy: Secondary | ICD-10-CM

## 2016-07-11 DIAGNOSIS — M25561 Pain in right knee: Secondary | ICD-10-CM

## 2016-07-11 DIAGNOSIS — K219 Gastro-esophageal reflux disease without esophagitis: Secondary | ICD-10-CM

## 2016-07-11 MED ORDER — OMEPRAZOLE 20 MG PO CPDR: 40.0000 mg | DELAYED_RELEASE_CAPSULE | Freq: Every day | ORAL | 5 refills | Status: DC

## 2016-07-11 MED ORDER — MODAFINIL 200 MG PO TABS: 200.0000 mg | ORAL_TABLET | Freq: Every day | ORAL | 3 refills | Status: DC

## 2016-07-11 MED ORDER — METFORMIN HCL ER 500 MG PO TB24: 500.0000 mg | ORAL_TABLET | Freq: Every day | ORAL | 2 refills | Status: DC

## 2016-07-11 MED FILL — OMEPRAZOLE DR 20 MG CAPSULE: 20 | 3 days supply | Qty: 60 | Fill #0

## 2016-07-11 MED FILL — ?METFORMIN HCL 500MG TABLET: 500 | 30 days supply | Qty: 30 | Fill #0

## 2016-07-11 NOTE — Patient Instructions (Addendum)
[name] was seen today for headache and knee pain.  Diagnoses and all orders for this visit:  NARCOLEPSY W/O CATAPLEXY -     Ambulatory referral to Neurology -     modafinil (PROVIGIL) 200 MG tablet; Take 1 tablet (200 mg total) by mouth daily. For PASS  Gastroesophageal reflux disease, esophagitis presence not specified -     omeprazole (PRILOSEC) 20 MG capsule; Take 2 capsules (40 mg total) by mouth daily before supper.  Morbid obesity with body mass index of 70 and over in adult [facility]) -     metFORMIN (GLUCOPHAGE XR) 500 MG 24 hr tablet; Take 1 tablet (500 mg total) by mouth daily after supper.  weight loss is needed most for back and knee pain   You will be called about neurology referral Drop Rx for provigil off at the pharmacy, it should be available via PASS, patient medication assistance .   F/u in 4 weeks for narcolepsy  Dr. Armen Pickup

## 2016-07-11 NOTE — Progress Notes (Signed)
Patient is here for ED follow up and patient is having knee pain

## 2016-07-11 NOTE — Progress Notes (Signed)
Subjective:  Patient ID: [name], female    DOB: [birth year]  Age: 25 y.o. MRN: [id number]  CC: Headache and Knee Pain   HPI [name] has morbid obesity, ADHD, narcolepsy, OSA she  presents for    1. Headache: intermittent. Improved since recent ED visit. She is compliant with CPAP.   2. Knee pain: both knees hurt. She slipped at home and fell onto her L knee. No significant redness or swelling.   3. Narcolepsy/ADHD: she was unable to afford ritalin. She still has severe somnolence. She has tried nuvigil in the past without significant improvement in narcolepsy. She would be amenable to trying provigil. She is not followed by neurology.   Social History  Substance Use Topics  . Smoking status: Never Smoker  . Smokeless tobacco: Not on file  . Alcohol use Yes     Comment: ocassional    Outpatient Medications Prior to Visit  Medication Sig Dispense Refill  . Elastic Bandages & Supports (MEDICAL COMPRESSION SOCKS) MISC 1 each by Does not apply route daily. 30 mmHg pressure 2 each 0  . ferrous sulfate 325 (65 FE) MG tablet Take 1 tablet (325 mg total) by mouth 3 (three) times daily with meals. 90 tablet 5  . fluticasone (FLONASE) 50 MCG/ACT nasal spray Place 2 sprays into both nostrils daily. (Patient not taking: Reported on 05/24/2016) 16 g 6  . ibuprofen (ADVIL,MOTRIN) 800 MG tablet Take 1 tablet (800 mg total) by mouth every 8 (eight) hours as needed for mild pain. (Patient not taking: Reported on 05/24/2016) 30 tablet 0  . methylphenidate (RITALIN) 5 MG tablet Take 1 tablet (5 mg total) by mouth 2 (two) times daily with breakfast and lunch. 60 tablet 0  . omeprazole (PRILOSEC) 40 MG capsule Take 1 capsule (40 mg total) by mouth daily before supper. 30 capsule 5   No facility-administered medications prior to visit.     ROS Review of Systems  Constitutional: Negative for chills and fever.  Eyes: Negative for visual disturbance.  Respiratory: Negative for  shortness of breath.   Cardiovascular: Negative for chest pain.  Gastrointestinal: Negative for abdominal pain and blood in stool.  Musculoskeletal: Positive for arthralgias. Negative for back pain.  Skin: Negative for rash.  Allergic/Immunologic: Negative for immunocompromised state.  Neurological: Positive for headaches.  Hematological: Negative for adenopathy. Does not bruise/bleed easily.  Psychiatric/Behavioral: Positive for sleep disturbance. Negative for dysphoric mood and suicidal ideas.    Objective:  BP 121/74 (BP Location: Right Wrist, Patient Position: Sitting, Cuff Size: Large)   Pulse 91   Temp 98.2 F (36.8 C) (Oral)   Resp 17   Ht 5\' 3"  (1.6 m)   Wt (!) 399 lb 6.4 oz (181.2 kg)   LMP 06/24/2016 (Approximate)   SpO2 99%   BMI 70.75 kg/m   BP/Weight 07/11/2016 06/08/2016 05/24/2016  Systolic BP 121 130 112  Diastolic BP 74 71 73  Wt. (Lbs) 399.4 401.38 390  BMI 70.75 71.12 69.1   Physical Exam  Constitutional: She is oriented to person, place, and time. She appears well-developed and well-nourished. No distress.  Obese   HENT:  Head: Normocephalic and atraumatic.  Cardiovascular: Normal rate, regular rhythm, normal heart sounds and intact distal pulses.   Pulmonary/Chest: Effort normal and breath sounds normal.  Musculoskeletal: She exhibits edema (L >R trace in legs ).  Neurological: She is alert and oriented to person, place, and time.  Skin: Skin is warm and dry. No rash noted.  Psychiatric: She has a normal mood and affect.   Lab Results  Component Value Date   HGBA1C 5.30 03/28/2015    Assessment & Plan:  [name] was seen today for headache and knee pain.  Diagnoses and all orders for this visit:  NARCOLEPSY W/O CATAPLEXY -     Ambulatory referral to Neurology -     modafinil (PROVIGIL) 200 MG tablet; Take 1 tablet (200 mg total) by mouth daily. For PASS  Gastroesophageal reflux disease, esophagitis presence not specified -     omeprazole  (PRILOSEC) 20 MG capsule; Take 2 capsules (40 mg total) by mouth daily before supper.  Morbid obesity with body mass index of 70 and over in adult [facility]) -     metFORMIN (GLUCOPHAGE XR) 500 MG 24 hr tablet; Take 1 tablet (500 mg total) by mouth daily after supper.   There are no diagnoses linked to this encounter.  No orders of the defined types were placed in this encounter.   Follow-up: No Follow-up on file.   Dessa Phi MD

## 2016-07-14 DIAGNOSIS — M25562 Pain in left knee: Secondary | ICD-10-CM

## 2016-07-14 DIAGNOSIS — M25561 Pain in right knee: Secondary | ICD-10-CM | POA: Insufficient documentation

## 2016-07-14 NOTE — Assessment & Plan Note (Signed)
bl knee pain in setting of morbid obesity Exam is not consistent with acute fracture or significant soft tissue injury  Plan: Weight loss Tylenol or ibuprofen for pain control

## 2016-07-14 NOTE — Assessment & Plan Note (Signed)
provigil ordered via PASS  neurology referral placed

## 2016-07-14 NOTE — Assessment & Plan Note (Signed)
Morbid obesity Start metformin with goal of weight loss

## 2016-07-24 ENCOUNTER — Telehealth: Payer: Self-pay | Admitting: Family Medicine

## 2016-07-24 NOTE — Telephone Encounter (Signed)
I Spoke to [name] she don't have the CAFA that's why they are asking for $200 copay . Patient schedule to see the financial on 8/14 @ 2:30pm .

## 2016-07-24 NOTE — Telephone Encounter (Signed)
[facility] called states they will be unable to see patient due to patient not having copay amount. Please f/up

## 2016-07-29 ENCOUNTER — Ambulatory Visit: Payer: Self-pay

## 2016-08-01 ENCOUNTER — Encounter: Payer: Self-pay | Admitting: Family Medicine

## 2016-08-08 ENCOUNTER — Ambulatory Visit: Payer: Self-pay | Admitting: Family Medicine

## 2016-08-14 ENCOUNTER — Ambulatory Visit: Payer: Self-pay | Attending: Internal Medicine

## 2016-08-23 ENCOUNTER — Encounter: Payer: Self-pay | Admitting: Family Medicine

## 2016-08-23 ENCOUNTER — Ambulatory Visit: Payer: Self-pay | Attending: Family Medicine | Admitting: Family Medicine

## 2016-08-23 ENCOUNTER — Telehealth: Payer: Self-pay | Admitting: Family Medicine

## 2016-08-23 VITALS — BP 127/85 | HR 92 | Temp 98.4°F | Wt >= 6400 oz

## 2016-08-23 DIAGNOSIS — G47419 Narcolepsy without cataplexy: Secondary | ICD-10-CM | POA: Insufficient documentation

## 2016-08-23 DIAGNOSIS — Z23 Encounter for immunization: Secondary | ICD-10-CM

## 2016-08-23 DIAGNOSIS — R0782 Intercostal pain: Secondary | ICD-10-CM | POA: Insufficient documentation

## 2016-08-23 DIAGNOSIS — R51 Headache: Secondary | ICD-10-CM | POA: Insufficient documentation

## 2016-08-23 DIAGNOSIS — D509 Iron deficiency anemia, unspecified: Secondary | ICD-10-CM | POA: Insufficient documentation

## 2016-08-23 LAB — CBC
HEMATOCRIT: 30 % — AB (ref 35.0–45.0)
HEMOGLOBIN: 9.1 g/dL — AB (ref 11.7–15.5)
MCH: 18.8 pg — AB (ref 27.0–33.0)
MCHC: 30.3 g/dL — AB (ref 32.0–36.0)
MCV: 62 fL — ABNORMAL LOW (ref 80.0–100.0)
MPV: 9.6 fL (ref 7.5–12.5)
Platelets: 386 10*3/uL (ref 140–400)
RBC: 4.84 MIL/uL (ref 3.80–5.10)
RDW: 19 % — ABNORMAL HIGH (ref 11.0–15.0)
WBC: 7.6 10*3/uL (ref 3.8–10.8)

## 2016-08-23 MED ORDER — IBUPROFEN 800 MG PO TABS: 800.0000 mg | ORAL_TABLET | Freq: Three times a day (TID) | ORAL | 0 refills | Status: DC | PRN

## 2016-08-23 NOTE — Patient Instructions (Addendum)
Deseri was seen today for follow-up.  Diagnoses and all orders for this visit:  Intercostal pain -     ibuprofen (ADVIL,MOTRIN) 800 MG tablet; Take 1 tablet (800 mg total) by mouth every 8 (eight) hours as needed for mild pain.  Microcytic anemia -     CBC  NARCOLEPSY W/O CATAPLEXY -     Ambulatory referral to Sleep Studies -     Ambulatory referral to Neurology  Other orders -     Flu Vaccine QUAD 36+ mos IM   F/u in 6 weeks for narcolepsy   Go to ED if you develop severe chest pain or shortness of breath.   Dr. Armen PickupFunches

## 2016-08-23 NOTE — Progress Notes (Signed)
Subjective:  Patient ID: [name], female    DOB: [birth year]  Age: 25 y.o. MRN: [id number]  CC: Follow-up   HPI Reana A Tarquinio has morbid obesity, ADHD, narcolepsy, OSA, anemia she  presents for    1. Headache: intermittent. Improved since recent ED visit. She is not compliant with CPAP stating that she needs new tubing.   2. Narcolepsy/ADHD: she was unable to afford ritalin. She still has severe somnolence. She has tried nuvigil in the past without significant improvement in narcolepsy. She would be amenable to trying provigil. She is not followed by neurology. Since her last OV she has not started provigil.    Social History  Substance Use Topics  . Smoking status: Never Smoker  . Smokeless tobacco: Not on file  . Alcohol use Yes     Comment: ocassional    Outpatient Medications Prior to Visit  Medication Sig Dispense Refill  . ferrous sulfate 325 (65 FE) MG tablet Take 1 tablet (325 mg total) by mouth 3 (three) times daily with meals. 90 tablet 5  . metFORMIN (GLUCOPHAGE XR) 500 MG 24 hr tablet Take 1 tablet (500 mg total) by mouth daily after supper. 30 tablet 2  . modafinil (PROVIGIL) 200 MG tablet Take 1 tablet (200 mg total) by mouth daily. For PASS 90 tablet 3  . omeprazole (PRILOSEC) 20 MG capsule Take 2 capsules (40 mg total) by mouth daily before supper. 60 capsule 5  . Elastic Bandages & Supports (MEDICAL COMPRESSION SOCKS) MISC 1 each by Does not apply route daily. 30 mmHg pressure (Patient not taking: Reported on 07/11/2016) 2 each 0  . fluticasone (FLONASE) 50 MCG/ACT nasal spray Place 2 sprays into both nostrils daily. (Patient not taking: Reported on 05/24/2016) 16 g 6  . ibuprofen (ADVIL,MOTRIN) 800 MG tablet Take 1 tablet (800 mg total) by mouth every 8 (eight) hours as needed for mild pain. (Patient not taking: Reported on 05/24/2016) 30 tablet 0   No facility-administered medications prior to visit.     ROS Review of Systems  Constitutional:  Positive for fatigue. Negative for chills and fever.  Eyes: Negative for visual disturbance.  Respiratory: Negative for shortness of breath.   Cardiovascular: Positive for chest pain.  Gastrointestinal: Negative for abdominal pain and blood in stool.  Musculoskeletal: Positive for arthralgias (R shoulder, knees ). Negative for back pain.  Skin: Negative for rash.  Allergic/Immunologic: Negative for immunocompromised state.  Neurological: Negative for headaches.  Hematological: Negative for adenopathy. Does not bruise/bleed easily.  Psychiatric/Behavioral: Positive for sleep disturbance. Negative for dysphoric mood and suicidal ideas.    Objective:  BP 127/85 (BP Location: Right Wrist, Patient Position: Sitting, Cuff Size: Small)   Pulse 92   Temp 98.4 F (36.9 C) (Oral)   Wt (!) 412 lb 3.2 oz (187 kg)   SpO2 100%   BMI 73.02 kg/m   BP/Weight 08/23/2016 07/11/2016 06/08/2016  Systolic BP 127 121 130  Diastolic BP 85 74 71  Wt. (Lbs) 412.2 399.4 401.38  BMI 73.02 70.75 71.12   Physical Exam  Constitutional: She is oriented to person, place, and time. She appears well-developed and well-nourished. No distress.  Morbidly obese   HENT:  Head: Normocephalic and atraumatic.  Cardiovascular: Normal rate, regular rhythm, normal heart sounds and intact distal pulses.   Pulmonary/Chest: Effort normal and breath sounds normal.  Musculoskeletal: She exhibits edema (L >R trace in legs ).  Neurological: She is alert and oriented to person, place, and time.  Skin: Skin is warm and dry. No rash noted.  Psychiatric: She has a normal mood and affect.   Lab Results  Component Value Date   HGBA1C 5.30 03/28/2015    Assessment & Plan:  Marget was seen today for follow-up.  Diagnoses and all orders for this visit:  Intercostal pain -     ibuprofen (ADVIL,MOTRIN) 800 MG tablet; Take 1 tablet (800 mg total) by mouth every 8 (eight) hours as needed for mild pain.  Microcytic anemia -      CBC  NARCOLEPSY W/O CATAPLEXY -     Cancel: Ambulatory referral to Sleep Studies -     Ambulatory referral to Neurology -     Split night study; Future  Other orders -     Flu Vaccine QUAD 36+ mos IM   There are no diagnoses linked to this encounter.  No orders of the defined types were placed in this encounter.   Follow-up: No Follow-up on file.   Dessa PhiJosalyn Dayanne Yiu MD

## 2016-08-25 DIAGNOSIS — R0782 Intercostal pain: Secondary | ICD-10-CM | POA: Insufficient documentation

## 2016-08-26 ENCOUNTER — Telehealth: Payer: Self-pay

## 2016-08-26 NOTE — Telephone Encounter (Signed)
Pt was called on 9/11 and informed of results.

## 2016-08-26 NOTE — Telephone Encounter (Signed)
Sleep study order corrected.

## 2016-10-07 ENCOUNTER — Ambulatory Visit (HOSPITAL_BASED_OUTPATIENT_CLINIC_OR_DEPARTMENT_OTHER): Payer: Self-pay | Attending: Family Medicine | Admitting: Internal Medicine

## 2016-10-07 DIAGNOSIS — G4733 Obstructive sleep apnea (adult) (pediatric): Secondary | ICD-10-CM | POA: Insufficient documentation

## 2016-10-07 DIAGNOSIS — R0683 Snoring: Secondary | ICD-10-CM | POA: Insufficient documentation

## 2016-10-07 DIAGNOSIS — G47419 Narcolepsy without cataplexy: Secondary | ICD-10-CM | POA: Insufficient documentation

## 2016-10-07 DIAGNOSIS — G4710 Hypersomnia, unspecified: Secondary | ICD-10-CM | POA: Insufficient documentation

## 2016-10-09 ENCOUNTER — Telehealth: Payer: Self-pay | Admitting: Family Medicine

## 2016-10-09 NOTE — Telephone Encounter (Signed)
Pt. Called requesting a letter from PCP stating that she has a sleeping disorder. Pt. Needs the letter so that she can continue receiving food stamps.  Please f/u.

## 2016-10-12 DIAGNOSIS — G47419 Narcolepsy without cataplexy: Secondary | ICD-10-CM

## 2016-10-12 NOTE — Procedures (Signed)
  Patient Name: [name], [name] Study Date: 10/07/2016 Gender: Female D.O.B: 03-Apr-1991 Age (years): 25 Referring Provider: Gerilyn NestleJosalyn Funches Height (inches): 63 Interpreting Physician: Jetty Duhamellinton Young MD, ABSM Weight (lbs): 398 RPSGT: [place], [place] BMI: 70 MRN: [id number] Neck Size: 17.00 CLINICAL INFORMATION Sleep Study Type: NPSG Indication for sleep study: Hypersomnia with sleep apnea Epworth Sleepiness Score: 22  SLEEP STUDY TECHNIQUE As per the AASM Manual for the Scoring of Sleep and Associated Events v2.3 (April 2016) with a hypopnea requiring 4% desaturations. The channels recorded and monitored were frontal, central and occipital EEG, electrooculogram (EOG), submentalis EMG (chin), nasal and oral airflow, thoracic and abdominal wall motion, anterior tibialis EMG, snore microphone, electrocardiogram, and pulse oximetry.  MEDICATIONS Medications self-administered by patient taken the night of the study : none  SLEEP ARCHITECTURE The study was initiated at 9:04:40 AM and ended at 3:17:24 PM. Sleep onset time was 0.0 minutes and the sleep efficiency was 86.1%. The total sleep time was 321.0 minutes. Stage REM latency was 0.0 minutes. The patient spent 12.46% of the night in stage N1 sleep, 36.29% in stage N2 sleep, 30.37% in stage N3 and 20.88% in REM. Alpha intrusion was absent. Supine sleep was 99.97%.  RESPIRATORY PARAMETERS The overall apnea/hypopnea index (AHI) was 5.6 per hour. There were 20 total apneas, including 13 obstructive, 5 central and 2 mixed apneas. There were 10 hypopneas and 11 RERAs. The AHI during Stage REM sleep was 24.2 per hour. AHI while supine was 5.6 per hour. The mean oxygen saturation was 98.32%. Loud snoring was noted during this study.  CARDIAC DATA The 2 lead EKG demonstrated sinus rhythm. The mean heart rate was N/A beats per minute. Other EKG findings include: None.  LEG MOVEMENT DATA The total PLMS were 0 with a resulting  PLMS index of 0.00. Associated arousal with leg movement index was 0.0 .  IMPRESSIONS - Mild obstructive sleep apnea occurred during this study (AHI = 5.6/h). - No significant central sleep apnea occurred during this study (CAI = 0.9/h). - Oxygen desaturation to 82% was noted during this study . - The patient snored with Loud snoring volume. - No cardiac abnormalities were noted during this study. - Clinically significant periodic limb movements did not occur during sleep. No significant associated arousals.  DIAGNOSIS - Obstructive Sleep Apnea (327.23 [G47.33 ICD-10])  RECOMMENDATIONS - Very mild obstructive sleep apnea. Return to discuss treatment options. - Avoid alcohol, sedatives and other CNS depressants that may worsen sleep apnea and disrupt normal sleep architecture. - Sleep hygiene should be reviewed to assess factors that may improve sleep quality. - Weight management and regular exercise should be initiated or continued if appropriate.  [Electronically signed] 10/12/2016 12:12 PM  Jetty Duhamellinton Young MD, ABSM Diplomate, American Board of Sleep Medicine   NPI: [id number]  Waymon BudgeYOUNG,CLINTON D Diplomate, American Board of Sleep Medicine  ELECTRONICALLY SIGNED ON:  10/12/2016, 12:06 PM [facility] PH: [phone]   FX: [phone] ACCREDITED BY THE AMERICAN ACADEMY OF SLEEP MEDICINE

## 2016-10-14 NOTE — Telephone Encounter (Signed)
Pt was called on 10/30 and a VM was left for pt informing her that here letter is ready for pick up. The letter will be in the box upfront.

## 2016-10-14 NOTE — Telephone Encounter (Signed)
Letter written  Please call patient to pick up

## 2016-10-16 ENCOUNTER — Telehealth: Payer: Self-pay

## 2016-10-16 NOTE — Telephone Encounter (Signed)
Pt was called on 11/01 and informed of results from sleep study.

## 2016-10-28 ENCOUNTER — Telehealth: Payer: Self-pay | Admitting: Family Medicine

## 2016-10-28 DIAGNOSIS — G47419 Narcolepsy without cataplexy: Secondary | ICD-10-CM

## 2016-10-28 NOTE — Telephone Encounter (Signed)
Pt. Called stating that the sleep study department called her and told her that she needs to have a MSLT sleep study . Please f/u

## 2016-10-29 NOTE — Telephone Encounter (Signed)
Will route to PCP 

## 2016-10-29 NOTE — Telephone Encounter (Signed)
Order placed

## 2016-11-04 ENCOUNTER — Other Ambulatory Visit: Payer: Self-pay | Admitting: Obstetrics & Gynecology

## 2016-12-26 ENCOUNTER — Ambulatory Visit: Payer: Self-pay | Admitting: Family Medicine

## 2016-12-29 ENCOUNTER — Encounter (HOSPITAL_COMMUNITY): Payer: Self-pay

## 2016-12-29 ENCOUNTER — Emergency Department (HOSPITAL_COMMUNITY)
Admission: EM | Admit: 2016-12-29 | Discharge: 2016-12-30 | Disposition: A | Discharge status: Home / Self Care | Payer: Self-pay | Attending: Emergency Medicine | Admitting: Emergency Medicine

## 2016-12-29 DIAGNOSIS — G4489 Other headache syndrome: Secondary | ICD-10-CM | POA: Insufficient documentation

## 2016-12-29 DIAGNOSIS — F909 Attention-deficit hyperactivity disorder, unspecified type: Secondary | ICD-10-CM | POA: Insufficient documentation

## 2016-12-29 DIAGNOSIS — J45909 Unspecified asthma, uncomplicated: Secondary | ICD-10-CM | POA: Insufficient documentation

## 2016-12-29 DIAGNOSIS — R05 Cough: Secondary | ICD-10-CM | POA: Insufficient documentation

## 2016-12-29 DIAGNOSIS — R059 Cough, unspecified: Secondary | ICD-10-CM

## 2016-12-29 DIAGNOSIS — Z7984 Long term (current) use of oral hypoglycemic drugs: Secondary | ICD-10-CM | POA: Insufficient documentation

## 2016-12-29 NOTE — ED Triage Notes (Signed)
Onset 2 weeks cough, onset yesterday headache.  Pt took Excedrin with no relief.

## 2016-12-30 ENCOUNTER — Ambulatory Visit (HOSPITAL_BASED_OUTPATIENT_CLINIC_OR_DEPARTMENT_OTHER): Payer: Self-pay

## 2016-12-30 LAB — CBC WITH DIFFERENTIAL/PLATELET
BASOS PCT: 0 %
Basophils Absolute: 0 10*3/uL (ref 0.0–0.1)
EOS ABS: 0.2 10*3/uL (ref 0.0–0.7)
Eosinophils Relative: 2 %
HCT: 33 % — ABNORMAL LOW (ref 36.0–46.0)
HEMOGLOBIN: 10.1 g/dL — AB (ref 12.0–15.0)
LYMPHS ABS: 2.6 10*3/uL (ref 0.7–4.0)
Lymphocytes Relative: 34 %
MCH: 19.3 pg — AB (ref 26.0–34.0)
MCHC: 30.6 g/dL (ref 30.0–36.0)
MCV: 63.2 fL — ABNORMAL LOW (ref 78.0–100.0)
MONO ABS: 0.6 10*3/uL (ref 0.1–1.0)
Monocytes Relative: 8 %
NEUTROS ABS: 4.3 10*3/uL (ref 1.7–7.7)
Neutrophils Relative %: 56 %
PLATELETS: 374 10*3/uL (ref 150–400)
RBC: 5.22 MIL/uL — ABNORMAL HIGH (ref 3.87–5.11)
RDW: 19.4 % — ABNORMAL HIGH (ref 11.5–15.5)
WBC: 7.7 10*3/uL (ref 4.0–10.5)

## 2016-12-30 LAB — BASIC METABOLIC PANEL
Anion gap: 9 (ref 5–15)
BUN: 7 mg/dL (ref 6–20)
CALCIUM: 9 mg/dL (ref 8.9–10.3)
CO2: 24 mmol/L (ref 22–32)
CREATININE: 0.56 mg/dL (ref 0.44–1.00)
Chloride: 105 mmol/L (ref 101–111)
GFR calc Af Amer: >60 mL/min (ref >60)
GFR calc non Af Amer: >60 mL/min (ref >60)
GLUCOSE: 89 mg/dL (ref 65–99)
Potassium: 3.8 mmol/L (ref 3.5–5.1)
Sodium: 138 mmol/L (ref 135–145)

## 2016-12-30 LAB — I-STAT BETA HCG BLOOD, ED (MC, WL, AP ONLY)

## 2016-12-30 LAB — URINALYSIS, ROUTINE W REFLEX MICROSCOPIC
Bilirubin Urine: NEGATIVE
Glucose, UA: NEGATIVE mg/dL
Hgb urine dipstick: NEGATIVE
KETONES UR: NEGATIVE mg/dL
LEUKOCYTES UA: NEGATIVE
Nitrite: NEGATIVE
Protein, ur: NEGATIVE mg/dL
Specific Gravity, Urine: 1.028 (ref 1.005–1.030)
pH: 5 (ref 5.0–8.0)

## 2016-12-30 MED ORDER — METOCLOPRAMIDE HCL 5 MG/ML IJ SOLN
10.0000 mg | Freq: Once | INTRAMUSCULAR | Status: AC
  Administered 2016-12-30: 10 mg via INTRAVENOUS
  Filled 2016-12-30: qty 2

## 2016-12-30 MED ORDER — DIPHENHYDRAMINE HCL 50 MG/ML IJ SOLN
25.0000 mg | Freq: Once | INTRAMUSCULAR | Status: AC
  Administered 2016-12-30: 25 mg via INTRAVENOUS
  Filled 2016-12-30: qty 1

## 2016-12-30 MED ORDER — SODIUM CHLORIDE 0.9 % IV BOLUS (SEPSIS)
1000.0000 mL | Freq: Once | INTRAVENOUS | Status: AC
  Administered 2016-12-30: 1000 mL via INTRAVENOUS

## 2016-12-30 NOTE — ED Provider Notes (Signed)
[facility]-EMERGENCY DEPT Provider Note   CSN: [id number] Arrival date & time: 12/29/16  2055  By signing my name below, I, Alyssa Grove, attest that this documentation has been prepared under the direction and in the presence of Zadie Rhine, MD. Electronically Signed: Alyssa Grove, ED Scribe. 12/30/16. 12:50 AM.   History   Chief Complaint Chief Complaint  Patient presents with  . Cough  . Headache   PMHx of Narcolepsy. She reports associated abdominal pain and increased urinary frequency.   The history is provided by the patient. No language interpreter was used.  Cough  This is a new problem. The current episode started more than 1 week ago (2 weeks ago). The problem occurs every few minutes. The problem has not changed since onset.Associated symptoms include headaches.  Headache   This is a new problem. The current episode started yesterday. The problem occurs constantly. The pain is moderate. The pain does not radiate. Pertinent negatives include no fever and no vomiting. Associated symptoms comments: + Photophobia. Treatments tried: Excedrin. The treatment provided no relief.    Past Medical History:  Diagnosis Date  . Asthma   . Clotting disorder (HCC)   . 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 Exogenous obesity   . Narcolepsy cataplexy syndrome 2007   Prior sleep studies starting on September 26 2005, AHI 5.3 per hour,  . Poor sleep hygiene     Patient Active Problem List   Diagnosis Date Noted  . Intercostal pain 08/25/2016  . Knee pain, bilateral 07/14/2016  . Venous stasis dermatitis of both lower extremities 05/26/2016  . Stye external 03/25/2016  . Plantar fasciitis, right 03/25/2016  . Leg swelling 11/17/2015  . Headache 09/08/2015  . Pain of molar 05/26/2015  . Sebaceous cyst of left axilla 04/13/2015  . Vitamin D deficiency 03/29/2015  . Allergic rhinitis 04/11/2010   . Microcytic anemia 03/09/2010  . Esophageal reflux 03/24/2009  . MENORRHAGIA 03/17/2009  . Morbid obesity with body mass index of 70 and over in adult [facility]) 05/15/2007  . Depression 02/12/2007  . 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 none      OB History    No data available       Home Medications    Prior to Admission medications   Medication Sig Start Date End Date Taking? Authorizing Provider  ferrous sulfate 325 (65 FE) MG tablet Take 1 tablet (325 mg total) by mouth 3 (three) times daily with meals. 03/22/16   Josalyn Funches, MD  ibuprofen (ADVIL,MOTRIN) 800 MG tablet Take 1 tablet (800 mg total) by mouth every 8 (eight) hours as needed for mild pain. 08/23/16   Josalyn Funches, MD  metFORMIN (GLUCOPHAGE XR) 500 MG 24 hr tablet Take 1 tablet (500 mg total) by mouth daily after supper. 07/11/16   Josalyn Funches, MD  modafinil (PROVIGIL) 200 MG tablet Take 1 tablet (200 mg total) by mouth daily. For PASS 07/11/16   Dessa Phi, MD  omeprazole (PRILOSEC) 20 MG capsule Take 2 capsules (40 mg total) by mouth daily before supper. 07/11/16   Dessa Phi, MD    Family History Family History  Problem Relation Age of Onset  . Hypertension Mother   . Asthma Mother   . Alcohol abuse Father   . Asthma Brother   .  Alcohol abuse Maternal Grandmother     Social History Social History  Substance Use Topics  . Smoking status: Never Smoker  . Smokeless tobacco: Never Used  . Alcohol use Yes     Comment: ocassional     Allergies   Banana   Review of Systems Review of Systems  Constitutional: Negative for fever.  Eyes: Positive for photophobia.  Respiratory: Positive for cough.   Gastrointestinal: Positive for abdominal pain. Negative for diarrhea and vomiting.  Genitourinary: Positive for frequency.  Neurological: Positive for headaches.    All other systems reviewed and are negative.  Physical Exam Updated Vital Signs BP 116/57 (BP Location: Left Arm)   Pulse 78   Temp 98.3 F (36.8 C) (Oral)   Resp 22   LMP 11/25/2016   SpO2 100%   Physical Exam CONSTITUTIONAL: Well developed/well nourished HEAD: Normocephalic/atraumatic EYES: EOMI/PERRL ENMT: Mucous membranes moist NECK: supple no meningeal signs SPINE/BACK:entire spine nontender CV: S1/S2 noted, no murmurs/rubs/gallops noted LUNGS: Lungs are clear to auscultation bilaterally, no apparent distress ABDOMEN: soft, nontender, no rebound or guarding, bowel sounds noted throughout abdomen.  Obesity noted GU:no cva tenderness NEURO: Pt is awake/alert/appropriate, moves all extremitiesx4.  No facial droop.  No arm or leg drift.  Patient is ambulatory EXTREMITIES: pulses normal/equal, full ROM SKIN: warm, color normal PSYCH: flat affect  ED Treatments / Results  DIAGNOSTIC STUDIES: Oxygen Saturation is 100% on RA, normal by my interpretation.    COORDINATION OF CARE: 12:50 AM Discussed treatment plan with pt at bedside which includes Fluids and pt agreed to plan.  Labs (all labs ordered are listed, but only abnormal results are displayed) Labs Reviewed  CBC WITH DIFFERENTIAL/PLATELET - Abnormal; Notable for the following:       Result Value   RBC 5.22 (*)    Hemoglobin 10.1 (*)    HCT 33.0 (*)    MCV 63.2 (*)    MCH 19.3 (*)    RDW 19.4 (*)    All other components within normal limits  BASIC METABOLIC PANEL  URINALYSIS, ROUTINE W REFLEX MICROSCOPIC  I-STAT BETA HCG BLOOD, ED ([facility], [facility], [facility] ONLY)    EKG  EKG Interpretation None       Radiology No results found.  Procedures Procedures (including critical care time)  Medications Ordered in ED Medications  sodium chloride 0.9 % bolus 1,000 mL (0 mLs Intravenous Stopped 12/30/16 0331)  metoCLOPramide (REGLAN) injection 10 mg (10 mg Intravenous Given 12/30/16 0128)  diphenhydrAMINE (BENADRYL)  injection 25 mg (25 mg Intravenous Given 12/30/16 0128)     Initial Impression / Assessment and Plan / ED Course  I have reviewed the triage vital signs and the nursing notes.  Pertinent labs  results that were available during my care of the patient were reviewed by me and considered in my medical decision making (see chart for details).  Clinical Course     5:30 AM Pt monitored for several hours She slept for most of ER stay She is ambulatory She is taking PO No distress noted No focal facial/arm/leg weakness I doubt SAH or other acute neurologic emergency As for cough, no fever, lungs clear Labs otherwise reassuring She has sleep study today She is to call her PCP for followup this week We discussed strict ER return precautions   Final Clinical Impressions(s) / ED Diagnoses   Final diagnoses:  Other headache syndrome  Cough    New Prescriptions New Prescriptions   No medications on file   I personally  performed the services described in this documentation, which was scribed in my presence. The recorded information has been reviewed and is accurate.       Zadie Rhineonald Claudine Stallings, MD 12/30/16 [phone]

## 2016-12-30 NOTE — ED Notes (Signed)
Patient ambulated in the hallway, without dizziness

## 2016-12-30 NOTE — Discharge Instructions (Signed)

## 2017-01-01 ENCOUNTER — Encounter (HOSPITAL_BASED_OUTPATIENT_CLINIC_OR_DEPARTMENT_OTHER): Payer: Self-pay

## 2017-01-08 ENCOUNTER — Ambulatory Visit (HOSPITAL_BASED_OUTPATIENT_CLINIC_OR_DEPARTMENT_OTHER): Payer: Self-pay | Attending: Family Medicine | Admitting: Internal Medicine

## 2017-01-08 DIAGNOSIS — G4733 Obstructive sleep apnea (adult) (pediatric): Secondary | ICD-10-CM | POA: Insufficient documentation

## 2017-01-08 DIAGNOSIS — G47419 Narcolepsy without cataplexy: Secondary | ICD-10-CM | POA: Insufficient documentation

## 2017-01-10 ENCOUNTER — Encounter: Payer: Self-pay | Admitting: Family Medicine

## 2017-01-10 ENCOUNTER — Ambulatory Visit: Payer: Self-pay | Attending: Family Medicine | Admitting: Family Medicine

## 2017-01-10 VITALS — BP 128/80 | HR 92 | Temp 98.1°F | Ht 63.0 in | Wt >= 6400 oz

## 2017-01-10 DIAGNOSIS — Z7984 Long term (current) use of oral hypoglycemic drugs: Secondary | ICD-10-CM | POA: Insufficient documentation

## 2017-01-10 DIAGNOSIS — G47419 Narcolepsy without cataplexy: Secondary | ICD-10-CM | POA: Insufficient documentation

## 2017-01-10 DIAGNOSIS — Z6841 Body Mass Index (BMI) 40.0 and over, adult: Secondary | ICD-10-CM | POA: Insufficient documentation

## 2017-01-10 DIAGNOSIS — F329 Major depressive disorder, single episode, unspecified: Secondary | ICD-10-CM | POA: Insufficient documentation

## 2017-01-10 DIAGNOSIS — Z79899 Other long term (current) drug therapy: Secondary | ICD-10-CM | POA: Insufficient documentation

## 2017-01-10 DIAGNOSIS — F32A Depression, unspecified: Secondary | ICD-10-CM

## 2017-01-10 DIAGNOSIS — D649 Anemia, unspecified: Secondary | ICD-10-CM | POA: Insufficient documentation

## 2017-01-10 DIAGNOSIS — F909 Attention-deficit hyperactivity disorder, unspecified type: Secondary | ICD-10-CM | POA: Insufficient documentation

## 2017-01-10 DIAGNOSIS — G4733 Obstructive sleep apnea (adult) (pediatric): Secondary | ICD-10-CM | POA: Insufficient documentation

## 2017-01-10 DIAGNOSIS — G44219 Episodic tension-type headache, not intractable: Secondary | ICD-10-CM | POA: Insufficient documentation

## 2017-01-10 LAB — POCT GLYCOSYLATED HEMOGLOBIN (HGB A1C): Hemoglobin A1C: 5.7

## 2017-01-10 MED ORDER — METFORMIN HCL ER 500 MG PO TB24: 500.0000 mg | ORAL_TABLET | Freq: Every day | ORAL | 2 refills | Status: DC

## 2017-01-10 MED ORDER — BUPROPION HCL ER (XL) 150 MG PO TB24: 150.0000 mg | ORAL_TABLET | Freq: Every day | ORAL | 2 refills | Status: DC

## 2017-01-10 MED ORDER — AMOXICILLIN 500 MG PO CAPS: 500.0000 mg | ORAL_CAPSULE | Freq: Three times a day (TID) | ORAL | 0 refills | Status: DC

## 2017-01-10 MED FILL — AMOXICILLIN 500 MG CAPSULE: 500 | 10 days supply | Qty: 30 | Fill #0

## 2017-01-10 MED FILL — METFORMIN HCL ER 500 MG TAB: 500 | 30 days supply | Qty: 30 | Fill #0

## 2017-01-10 MED FILL — BUPROPION HCL XL 150 MG TAB: 150 | 30 days supply | Qty: 30 | Fill #0

## 2017-01-10 NOTE — Progress Notes (Signed)
Subjective:  Patient ID: [name], female    DOB: 1991-01-28  Age: 26 y.o. MRN: [id number]  CC: Hospitalization Follow-up   HPI Lajada A Cobbins has morbid obesity, ADHD, narcolepsy, mild OSA, anemia she  presents for    1. ED follow up headache: she was seen in ED on 12/29/2016 for headache. She has intermittent headache. She has known OSA and narcolepsy. She is not compliant with CPAP. She has had 4-5 similar headaches since her hospital visit. She is urinating a lot. She has some nausea with headaches. There is associated sensitivity to light. There is congestion. No fever or chills.  Headaches occur in middle of forehead.  No emesis.  Tylenol or execedrin help with headache. Today she took two tylenol.  2. Narcolepsy/ADHD: she was unable to afford ritalin or provigil. She still has severe somnolence. She has tried nuvigil in the past without significant improvement in narcolepsy. . She is not followed by neurology.   3. Depression: she has long standing depression. She was previously treated at [facility]. She denies suicidal ideation. She is depressed by her health, lack of resources, strained family relationships particularly with her mother.   Social History  Substance Use Topics  . Smoking status: Never Smoker  . Smokeless tobacco: Never Used  . Alcohol use Yes     Comment: ocassional    Outpatient Medications Prior to Visit  Medication Sig Dispense Refill  . ibuprofen (ADVIL,MOTRIN) 800 MG tablet Take 1 tablet (800 mg total) by mouth every 8 (eight) hours as needed for mild pain. 30 tablet 0  . omeprazole (PRILOSEC) 20 MG capsule Take 2 capsules (40 mg total) by mouth daily before supper. 60 capsule 5  . ferrous sulfate 325 (65 FE) MG tablet Take 1 tablet (325 mg total) by mouth 3 (three) times daily with meals. (Patient not taking: Reported on 01/10/2017) 90 tablet 5  . metFORMIN (GLUCOPHAGE XR) 500 MG 24 hr tablet Take 1 tablet (500 mg  total) by mouth daily after supper. (Patient not taking: Reported on 12/30/2016) 30 tablet 2  . modafinil (PROVIGIL) 200 MG tablet Take 1 tablet (200 mg total) by mouth daily. For PASS (Patient not taking: Reported on 12/30/2016) 90 tablet 3   No facility-administered medications prior to visit.     ROS Review of Systems  Constitutional: Positive for fatigue. Negative for chills and fever.  HENT: Positive for congestion.   Eyes: Negative for visual disturbance.  Respiratory: Negative for shortness of breath.   Cardiovascular: Negative for chest pain.  Gastrointestinal: Negative for abdominal pain and blood in stool.  Genitourinary: Positive for frequency. Negative for dysuria.  Musculoskeletal: Positive for arthralgias (R shoulder, knees ). Negative for back pain.  Skin: Negative for rash.  Allergic/Immunologic: Negative for immunocompromised state.  Neurological: Positive for headaches.  Hematological: Negative for adenopathy. Does not bruise/bleed easily.  Psychiatric/Behavioral: Positive for dysphoric mood and sleep disturbance. Negative for suicidal ideas.    Objective:  BP 128/80 (BP Location: Left Wrist, Patient Position: Sitting, Cuff Size: Small)   Pulse 92   Temp 98.1 F (36.7 C) (Oral)   Ht 5\' 3"  (1.6 m)   Wt (!) 407 lb 3.2 oz (184.7 kg)   SpO2 100%   BMI 72.13 kg/m   BP/Weight 01/10/2017 12/30/2016 10/07/2016  Systolic BP 128 104 -  Diastolic BP 80 63 -  Wt. (Lbs) 407.2 - -  BMI 72.13 - -   Wt Readings from Last 3 Encounters:  01/10/17 (!) 407 lb 3.2 oz (184.7 kg)  08/23/16 (!) 412 lb 3.2 oz (187 kg)  07/11/16 (!) 399 lb 6.4 oz (181.2 kg)    Physical Exam  Constitutional: She is oriented to person, place, and time. She appears well-developed and well-nourished. No distress.  Morbidly obese   HENT:  Head: Normocephalic and atraumatic.  Cardiovascular: Normal rate, regular rhythm, normal heart sounds and intact distal pulses.   Pulmonary/Chest: Effort normal  and breath sounds normal.  Musculoskeletal: She exhibits edema (L >R trace in legs ).  Neurological: She is alert and oriented to person, place, and time.  Fatigue during exam   Skin: Skin is warm and dry. No rash noted.  Psychiatric: She has a normal mood and affect.   Lab Results  Component Value Date   HGBA1C 5.7 01/10/2017   Depression screen [facility] 2/9 01/10/2017 07/11/2016 05/24/2016  Decreased Interest 3 1 3   Down, Depressed, Hopeless 3 3 3   PHQ - 2 Score 6 4 6   Altered sleeping 3 3 3   Tired, decreased energy 3 3 3   Change in appetite 3 2 2   Feeling bad or failure about yourself  3 2 -  Trouble concentrating 3 3 2   Moving slowly or fidgety/restless 2 3 1   Suicidal thoughts 0 0 -  PHQ-9 Score 23 20 17    GAD 7 : Generalized Anxiety Score 01/10/2017 07/11/2016 05/24/2016 04/12/2016  Nervous, Anxious, on Edge 2 2 - 3  Control/stop worrying 1 1 2 2   Worry too much - different things 3 2 2 2   Trouble relaxing 2 1 3 3   Restless 1 2 1  0  Easily annoyed or irritable 3 3 3 3   Afraid - awful might happen 2 3 3 2   Total GAD 7 Score 14 14 - 15     Assessment & Plan:  Davona was seen today for hospitalization follow-up.  Diagnoses and all orders for this visit:  Narcolepsy without cataplexy -     Ambulatory referral to Neuropsychology  Depression, unspecified depression type -     Ambulatory referral to Neuropsychology -     buPROPion (WELLBUTRIN XL) 150 MG 24 hr tablet; Take 1 tablet (150 mg total) by mouth daily.  Morbid obesity with body mass index of 70 and over in adult [facility](HCC) -     Amb ref to Medical Nutrition Therapy-MNT -     metFORMIN (GLUCOPHAGE XR) 500 MG 24 hr tablet; Take 1 tablet (500 mg total) by mouth daily after supper. -     POCT glycosylated hemoglobin (Hb A1C) -     buPROPion (WELLBUTRIN XL) 150 MG 24 hr tablet; Take 1 tablet (150 mg total) by mouth daily.  Episodic tension-type headache, not intractable -     amoxicillin (AMOXIL) 500 MG capsule; Take 1  capsule (500 mg total) by mouth 3 (three) times daily.   There are no diagnoses linked to this encounter.  No orders of the defined types were placed in this encounter.   Follow-up: Return in about 3 weeks (around 01/31/2017) for headaches and depression .   Dessa PhiJosalyn Eva Griffo MD

## 2017-01-10 NOTE — Patient Instructions (Addendum)
[name] was seen today for hospitalization follow-up.  Diagnoses and all orders for this visit:  Narcolepsy without cataplexy -     Ambulatory referral to Neuropsychology  Depression, unspecified depression type -     Ambulatory referral to Neuropsychology -     buPROPion (WELLBUTRIN XL) 150 MG 24 hr tablet; Take 1 tablet (150 mg total) by mouth daily.  Morbid obesity with body mass index of 70 and over in adult [facility](HCC) -     Amb ref to Medical Nutrition Therapy-MNT -     metFORMIN (GLUCOPHAGE XR) 500 MG 24 hr tablet; Take 1 tablet (500 mg total) by mouth daily after supper. -     POCT glycosylated hemoglobin (Hb A1C) -     buPROPion (WELLBUTRIN XL) 150 MG 24 hr tablet; Take 1 tablet (150 mg total) by mouth daily.  Episodic tension-type headache, not intractable -     amoxicillin (AMOXIL) 500 MG capsule; Take 1 capsule (500 mg total) by mouth 3 (three) times daily.   I do recommend that you establish with mental health but since your depression symptoms are significant, while you wait please start the antidepressant wellbutrin this is useful for depression and weight management  [facility].   F/u in 3 weeks for headaches   Dr. Armen PickupFunches

## 2017-01-12 NOTE — Assessment & Plan Note (Signed)
Restart Wellbutrin patient referred to counseling services

## 2017-01-12 NOTE — Assessment & Plan Note (Signed)
Referral to neuropsychology  Patient unable to afford stimulants due to lack of insurance and lack of income

## 2017-01-12 NOTE — Assessment & Plan Note (Signed)
Morbid obesity Plan: Continue metformin Add Wellbutrin Referral to nutritionist

## 2017-01-12 NOTE — Assessment & Plan Note (Signed)
Recurrent frontal headache Course of steroid  Patient has OSA Needs CPAP Needs mask

## 2017-01-19 DIAGNOSIS — G47419 Narcolepsy without cataplexy: Secondary | ICD-10-CM

## 2017-01-19 NOTE — Procedures (Signed)
    Patient Name: [name], Tyiesha Study Date: 01/08/2017 Gender: Female D.O.B: 03-Mar-1991 Age (years): 25 Referring Provider: Gerilyn NestleJosalyn Funches Height (inches): 63 Interpreting Physician: Jetty Duhamellinton Annamay Laymon MD, ABSM Weight (lbs): 398 RPSGT: [place], [place] BMI: 70 MRN: [id number] Neck Size: 17.00 CLINICAL INFORMATION Sleep Study Type: MSLT  The patient was referred to the sleep center for evaluation of daytime sleepiness.  Epworth Sleepiness Score: 22  SLEEP STUDY TECHNIQUE A Multiple Sleep Latency Test was performed after an overnight polysomnogram according to the AASM scoring manual v2.3 (April 2016) and clinical guidelines. Five nap opportunities occurred over the course of the test which followed an overnight polysomnogram. The channels recorded and monitored were frontal, central, and occipital electroencephalography (EEG), right and left electrooculogram (EOG), chin electromyography (EMG), and electrocardiogram (EKG).  MEDICATIONS Medications taken by the patient : charted for review Medications administered by patient during sleep study : None reported during this study.  IMPRESSIONS - Total number of naps attempted: 5.00 . Total number of naps with sleep attained: 5.00. The Mean Sleep Latency was 0:03 minutes. There were 5.00 sleep-onset REM periods. - The patient appears to have pathologic sleepiness, evidenced by a short mean sleep latency (8 minutes or less) on this MSLT. - 5.00 sleep onset REMs were noted during this MSLT. - Clinical  correlation required. Multiple Sleep Latency Tests are usually administered the day following an overnight NPSG to permit assessment of the preceding night sleep. In this case, pathologic daytime sleepiness is demonstrated together with increased REM pressure evidenced by early onset REM during each nap. 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  [Electronically signed] 01/19/2017 10:08 AM  Jetty Duhamellinton Maleyah Evans MD, ABSM Diplomate, American Board of Sleep Medicine   NPI: [id number]  Waymon BudgeYOUNG,Naziah Weckerly D Diplomate, American Board of Sleep Medicine  ELECTRONICALLY SIGNED ON:  01/19/2017, 10:08 AM [facility] PH: [phone]   FX: [phone] ACCREDITED BY THE AMERICAN ACADEMY OF SLEEP MEDICINE

## 2017-01-22 ENCOUNTER — Telehealth: Payer: Self-pay

## 2017-01-22 NOTE — Telephone Encounter (Signed)
Pt was called and informed of sleep study results. 

## 2017-01-23 ENCOUNTER — Telehealth: Payer: Self-pay | Admitting: Family Medicine

## 2017-01-23 NOTE — Telephone Encounter (Signed)
Patient called the office to speak with nurse regarding her sleep study results. Please follow up.  Thank you.

## 2017-01-27 NOTE — Telephone Encounter (Signed)
Pt was called back and results were explained to pt in more detail.

## 2017-01-31 ENCOUNTER — Ambulatory Visit: Payer: Self-pay | Attending: Family Medicine | Admitting: Family Medicine

## 2017-01-31 ENCOUNTER — Encounter: Payer: Self-pay | Admitting: Family Medicine

## 2017-01-31 VITALS — BP 112/75 | HR 87 | Temp 98.1°F | Ht 63.0 in | Wt >= 6400 oz

## 2017-01-31 DIAGNOSIS — D649 Anemia, unspecified: Secondary | ICD-10-CM | POA: Insufficient documentation

## 2017-01-31 DIAGNOSIS — G44219 Episodic tension-type headache, not intractable: Secondary | ICD-10-CM | POA: Insufficient documentation

## 2017-01-31 DIAGNOSIS — Z6841 Body Mass Index (BMI) 40.0 and over, adult: Secondary | ICD-10-CM | POA: Insufficient documentation

## 2017-01-31 DIAGNOSIS — R110 Nausea: Secondary | ICD-10-CM | POA: Insufficient documentation

## 2017-01-31 DIAGNOSIS — Z7984 Long term (current) use of oral hypoglycemic drugs: Secondary | ICD-10-CM | POA: Insufficient documentation

## 2017-01-31 DIAGNOSIS — G47419 Narcolepsy without cataplexy: Secondary | ICD-10-CM | POA: Insufficient documentation

## 2017-01-31 DIAGNOSIS — F909 Attention-deficit hyperactivity disorder, unspecified type: Secondary | ICD-10-CM | POA: Insufficient documentation

## 2017-01-31 DIAGNOSIS — G4733 Obstructive sleep apnea (adult) (pediatric): Secondary | ICD-10-CM | POA: Insufficient documentation

## 2017-01-31 DIAGNOSIS — F329 Major depressive disorder, single episode, unspecified: Secondary | ICD-10-CM | POA: Insufficient documentation

## 2017-01-31 MED ORDER — ARMODAFINIL 150 MG PO TABS: 150.0000 mg | ORAL_TABLET | Freq: Every day | ORAL | 0 refills | Status: DC

## 2017-01-31 MED ORDER — ONDANSETRON 4 MG PO TBDP
8.0000 mg | ORAL_TABLET | Freq: Once | ORAL | Status: AC
  Administered 2017-01-31: 8 mg via ORAL

## 2017-01-31 NOTE — Progress Notes (Signed)
Subjective:  Patient ID: [name], female    DOB: [birth year]  Age: 26 y.o. MRN: [id number]  CC: Headache and Depression   HPI Raea A Golda has morbid obesity, ADHD, narcolepsy, mild OSA, anemia she  presents for    1 Narcolepsy/ADHD: she was unable to afford ritalin or provigil. She had a sleep study on 01/08/2017 that confirmed narcolepsy.   still has severe somnolence. She has tried nuvigil in the past without significant improvement in narcolepsy. She  She is not followed by neurology due to lack of insurance.   2. Depression: she has long standing depression. She was previously treated at [facility] of the [place]. She denies suicidal ideation. She is depressed by her health, lack of resources, strained family relationships particularly with her mother. She has restarted wellbutrin. Her depression has improved a bit.   3. Headaches: persistent frontal headache.  Comes and goes since 2016.  Moderate to severe. Last hours when they occur. Having some headache today. Having some nausea. No emesis. She felt faint this AM, but doe not feel faint now. Amoxicillin has not helped with headache.   Social History  Substance Use Topics  . Smoking status: Never Smoker  . Smokeless tobacco: Never Used  . Alcohol use Yes     Comment: ocassional    Outpatient Medications Prior to Visit  Medication Sig Dispense Refill  . buPROPion (WELLBUTRIN XL) 150 MG 24 hr tablet Take 1 tablet (150 mg total) by mouth daily. 30 tablet 2  . ibuprofen (ADVIL,MOTRIN) 800 MG tablet Take 1 tablet (800 mg total) by mouth every 8 (eight) hours as needed for mild pain. 30 tablet 0  . metFORMIN (GLUCOPHAGE XR) 500 MG 24 hr tablet Take 1 tablet (500 mg total) by mouth daily after supper. 30 tablet 2  . omeprazole (PRILOSEC) 20 MG capsule Take 2 capsules (40 mg total) by mouth daily before supper. 60 capsule 5  . amoxicillin (AMOXIL) 500 MG capsule Take 1 capsule (500 mg total) by mouth 3 (three)  times daily. (Patient not taking: Reported on 01/31/2017) 30 capsule 0  . ferrous sulfate 325 (65 FE) MG tablet Take 1 tablet (325 mg total) by mouth 3 (three) times daily with meals. (Patient not taking: Reported on 01/10/2017) 90 tablet 5   No facility-administered medications prior to visit.     ROS Review of Systems  Constitutional: Positive for fatigue. Negative for chills and fever.  HENT: Positive for congestion.   Eyes: Negative for visual disturbance.  Respiratory: Negative for shortness of breath.   Cardiovascular: Negative for chest pain.  Gastrointestinal: Negative for abdominal pain and blood in stool.  Genitourinary: Positive for frequency. Negative for dysuria.  Musculoskeletal: Positive for arthralgias (R shoulder, knees ). Negative for back pain.  Skin: Negative for rash.  Allergic/Immunologic: Negative for immunocompromised state.  Neurological: Positive for headaches.  Hematological: Negative for adenopathy. Does not bruise/bleed easily.  Psychiatric/Behavioral: Positive for dysphoric mood and sleep disturbance. Negative for suicidal ideas.    Objective:  BP 112/75 (BP Location: Left Wrist, Patient Position: Sitting, Cuff Size: Small)   Pulse 87   Temp 98.1 F (36.7 C) (Oral)   Ht 5\' 3"  (1.6 m)   Wt (!) 412 lb 9.6 oz (187.2 kg)   LMP 01/13/2017   SpO2 99%   BMI 73.09 kg/m   BP/Weight 01/31/2017 01/10/2017 12/30/2016  Systolic BP 112 128 104  Diastolic BP 75 80 63  Wt. (Lbs) 412.6 407.2 -  BMI 73.09  72.13 -   Wt Readings from Last 3 Encounters:  01/31/17 (!) 412 lb 9.6 oz (187.2 kg)  01/10/17 (!) 407 lb 3.2 oz (184.7 kg)  08/23/16 (!) 412 lb 3.2 oz (187 kg)    Physical Exam  Constitutional: She is oriented to person, place, and time. She appears well-developed and well-nourished. No distress.  Morbidly obese   HENT:  Head: Normocephalic and atraumatic.  Cardiovascular: Normal rate, regular rhythm, normal heart sounds and intact distal pulses.     Pulmonary/Chest: Effort normal and breath sounds normal.  Musculoskeletal: She exhibits edema (L >R trace in legs ).  Neurological: She is alert and oriented to person, place, and time.  Fatigue during exam   Skin: Skin is warm and dry. No rash noted.  Psychiatric: She has a normal mood and affect.   Lab Results  Component Value Date   HGBA1C 5.7 01/10/2017   [facility] 01/31/2017 01/10/2017 07/11/2016  Decreased Interest 2 3 1   Down, Depressed, Hopeless 2 3 3   PHQ - 2 Score 4 6 4   Altered sleeping 3 3 3   Tired, decreased energy 3 3 3   Change in appetite 2 3 2   Feeling bad or failure about yourself  1 3 2   Trouble concentrating 3 3 3   Moving slowly or fidgety/restless 1 2 3   Suicidal thoughts 0 0 0  PHQ-9 Score 17 23 20    GAD 7 : Generalized Anxiety Score 01/31/2017 01/10/2017 07/11/2016 05/24/2016  Nervous, Anxious, on Edge 0 2 2 -  Control/stop worrying 1 1 1 2   Worry too much - different things 1 3 2 2   Trouble relaxing 0 2 1 3   Restless 1 1 2 1   Easily annoyed or irritable 3 3 3 3   Afraid - awful might happen 0 2 3 3   Total GAD 7 Score 6 14 14  -     Assessment & Plan:  Maddi was seen today for headache and depression.  Diagnoses and all orders for this visit:  Narcolepsy without cataplexy -     Ambulatory referral to Neurology -     Armodafinil (NUVIGIL) 150 MG tablet; Take 1 tablet (150 mg total) by mouth daily.  Nausea -     ondansetron (ZOFRAN-ODT) disintegrating tablet 8 mg; Take 2 tablets (8 mg total) by mouth once.  Episodic tension-type headache, not intractable -     CT Head Wo Contrast; Future   There are no diagnoses linked to this encounter.  No orders of the defined types were placed in this encounter.   Follow-up: Return in about 4 weeks (around 02/28/2017) for headache, narcolepsy .   Dessa PhiJosalyn Collyn Selk MD

## 2017-01-31 NOTE — Patient Instructions (Addendum)
[name] was seen today for headache and depression.  Diagnoses and all orders for this visit:  Narcolepsy without cataplexy -     Ambulatory referral to Neurology -     Armodafinil (NUVIGIL) 150 MG tablet; Take 1 tablet (150 mg total) by mouth daily.  Nausea -     ondansetron (ZOFRAN-ODT) disintegrating tablet 8 mg; Take 2 tablets (8 mg total) by mouth once.  Episodic tension-type headache, not intractable -     CT Head Wo Contrast; Future

## 2017-02-05 ENCOUNTER — Telehealth: Payer: Self-pay | Admitting: Family Medicine

## 2017-02-05 NOTE — Telephone Encounter (Signed)
Bonita QuinLinda from [facility] calling to clarify sleep study order that was recently placed States pt had a sleep study performed a couple of weeks prior  Requesting a call back at [phone] to clarify whether order was a mistake or not

## 2017-02-06 ENCOUNTER — Ambulatory Visit (HOSPITAL_COMMUNITY)
Admission: RE | Admit: 2017-02-06 | Discharge: 2017-02-06 | Disposition: A | Discharge status: Home / Self Care | Payer: Self-pay | Source: Ambulatory Visit | Attending: Family Medicine | Admitting: Family Medicine

## 2017-02-06 DIAGNOSIS — G44219 Episodic tension-type headache, not intractable: Secondary | ICD-10-CM | POA: Insufficient documentation

## 2017-02-06 DIAGNOSIS — J329 Chronic sinusitis, unspecified: Secondary | ICD-10-CM | POA: Insufficient documentation

## 2017-02-06 NOTE — Telephone Encounter (Signed)
[name] was called and a VM was left informing pt that no orders were put in for a sleep study from Dr. [name].

## 2017-02-07 ENCOUNTER — Telehealth: Payer: Self-pay

## 2017-02-07 NOTE — Telephone Encounter (Signed)
Pt contacted the office to receive results from her CT scan. I looked in Alycia basket didn't see results for patient. I informed pt that once provider review scan the nurse will give her a call to go over results.

## 2017-02-13 ENCOUNTER — Ambulatory Visit: Payer: Self-pay | Attending: Family Medicine | Admitting: Family Medicine

## 2017-02-13 ENCOUNTER — Telehealth: Payer: Self-pay

## 2017-02-13 ENCOUNTER — Encounter: Payer: Self-pay | Admitting: Family Medicine

## 2017-02-13 VITALS — BP 115/76 | HR 81 | Temp 98.0°F | Ht 63.0 in | Wt >= 6400 oz

## 2017-02-13 DIAGNOSIS — Z7984 Long term (current) use of oral hypoglycemic drugs: Secondary | ICD-10-CM | POA: Insufficient documentation

## 2017-02-13 DIAGNOSIS — G47419 Narcolepsy without cataplexy: Secondary | ICD-10-CM | POA: Insufficient documentation

## 2017-02-13 DIAGNOSIS — D509 Iron deficiency anemia, unspecified: Secondary | ICD-10-CM | POA: Insufficient documentation

## 2017-02-13 DIAGNOSIS — F329 Major depressive disorder, single episode, unspecified: Secondary | ICD-10-CM | POA: Insufficient documentation

## 2017-02-13 DIAGNOSIS — Z6841 Body Mass Index (BMI) 40.0 and over, adult: Secondary | ICD-10-CM | POA: Insufficient documentation

## 2017-02-13 DIAGNOSIS — F32A Depression, unspecified: Secondary | ICD-10-CM

## 2017-02-13 DIAGNOSIS — Z79899 Other long term (current) drug therapy: Secondary | ICD-10-CM | POA: Insufficient documentation

## 2017-02-13 DIAGNOSIS — J302 Other seasonal allergic rhinitis: Secondary | ICD-10-CM | POA: Insufficient documentation

## 2017-02-13 DIAGNOSIS — N920 Excessive and frequent menstruation with regular cycle: Secondary | ICD-10-CM | POA: Insufficient documentation

## 2017-02-13 LAB — CBC
HCT: 27.6 % — ABNORMAL LOW (ref 35.0–45.0)
HEMOGLOBIN: 8.1 g/dL — AB (ref 11.7–15.5)
MCH: 18.7 pg — ABNORMAL LOW (ref 27.0–33.0)
MCHC: 29.3 g/dL — ABNORMAL LOW (ref 32.0–36.0)
MCV: 63.6 fL — ABNORMAL LOW (ref 80.0–100.0)
MPV: 9 fL (ref 7.5–12.5)
Platelets: 409 10*3/uL — ABNORMAL HIGH (ref 140–400)
RBC: 4.34 MIL/uL (ref 3.80–5.10)
RDW: 19.5 % — ABNORMAL HIGH (ref 11.0–15.0)
WBC: 7.7 10*3/uL (ref 3.8–10.8)

## 2017-02-13 LAB — IRON AND TIBC
%SAT: 1 % — AB (ref 11–50)
TIBC: 350 ug/dL (ref 250–450)
UIBC: 345 ug/dL (ref 125–400)

## 2017-02-13 LAB — POCT URINE PREGNANCY: PREG TEST UR: NEGATIVE

## 2017-02-13 LAB — FERRITIN: FERRITIN: 6 ng/mL — AB (ref 10–154)

## 2017-02-13 MED ORDER — CETIRIZINE HCL 10 MG PO TABS: 10.0000 mg | ORAL_TABLET | Freq: Every day | ORAL | 11 refills | Status: DC

## 2017-02-13 MED ORDER — MEDROXYPROGESTERONE ACETATE 10 MG PO TABS: 10.0000 mg | ORAL_TABLET | Freq: Every day | ORAL | 0 refills | Status: DC

## 2017-02-13 MED ORDER — FERROUS SULFATE 325 (65 FE) MG PO TABS: 325.0000 mg | ORAL_TABLET | Freq: Three times a day (TID) | ORAL | 5 refills | Status: DC

## 2017-02-13 MED ORDER — BUPROPION HCL ER (XL) 300 MG PO TB24: 300.0000 mg | ORAL_TABLET | Freq: Every day | ORAL | 5 refills | Status: DC

## 2017-02-13 MED FILL — BUPROPION HCL XL 300 MG TAB: 300 | 30 days supply | Qty: 30 | Fill #0

## 2017-02-13 MED FILL — MEDROXYPROGESTERONE 10 MG T: 10 | 10 days supply | Qty: 10 | Fill #0

## 2017-02-13 MED FILL — FERROUS SULFATE 325 MG TAB: 325 (65 FE) | 30 days supply | Qty: 90 | Fill #0

## 2017-02-13 MED FILL — ?CETIRIZINE HCL 10 MG TABLE: 10 | 30 days supply | Qty: 30 | Fill #0

## 2017-02-13 NOTE — Progress Notes (Signed)
Subjective:  Patient ID: [name], female    DOB: Oct 29, 1991  Age: 26 y.o. MRN: [id number]  CC: Menorrhagia   HPI [name] has morbid obesity, narcolepsy, OSA, ADD, depression, chronic headache, intermittent menorrhagia with IDA anemia ( Hgb 6.8 01/2016) she presents for   1. Prolonged menstrual bleeding: x 1 month. Bleeding is moderate to heavy. She has never been sexually active. She reports mild headache this AM. In general headache has been mild over the last week. She reports weakness, fatigue. She is not taking iron. Mild cramping. She declines pelvic exam today.   2. Depression/ADD: slight improvement since starting Wellbutrin. She takes 150 mg XL most days reports that she forgets sometimes. Denies SI.  3. Narcolepsy: has not turned in nuvigil to pharmacy to check cost.   4. Chronic headache: improved. She denies facial pain. She admit to eye itching, runny nose and sneezing. CT head 02/12/2017: FINDINGS: Brain: No evidence for acute infarction, hemorrhage, mass lesion, hydrocephalus, or extra-axial fluid. Normal cerebral volume. No white matter disease.  Vascular: No hyperdense vessel or unexpected calcification.  Skull: Normal. Negative for fracture or focal lesion.  Sinuses/Orbits: Mild mucosal thickening in the maxillary sinus on the LEFT. Negative appearing orbits.  Other: Small calcification in the subcutaneous soft tissues of the cheek on the LEFT, without visible surrounding inflammation.  IMPRESSION: Mild chronic sinusitis, otherwise negative.   Social History  Substance Use Topics  . Smoking status: Never Smoker  . Smokeless tobacco: Never Used  . Alcohol use Yes     Comment: ocassional    Outpatient Medications Prior to Visit  Medication Sig Dispense Refill  . buPROPion (WELLBUTRIN XL) 150 MG 24 hr tablet Take 1 tablet (150 mg total) by mouth daily. 30 tablet 2  . ibuprofen (ADVIL,MOTRIN) 800 MG tablet Take 1 tablet (800  mg total) by mouth every 8 (eight) hours as needed for mild pain. 30 tablet 0  . metFORMIN (GLUCOPHAGE XR) 500 MG 24 hr tablet Take 1 tablet (500 mg total) by mouth daily after supper. 30 tablet 2  . omeprazole (PRILOSEC) 20 MG capsule Take 2 capsules (40 mg total) by mouth daily before supper. 60 capsule 5  . Armodafinil (NUVIGIL) 150 MG tablet Take 1 tablet (150 mg total) by mouth daily. (Patient not taking: Reported on 02/13/2017) 30 tablet 0  . ferrous sulfate 325 (65 FE) MG tablet Take 1 tablet (325 mg total) by mouth 3 (three) times daily with meals. (Patient not taking: Reported on 01/10/2017) 90 tablet 5   No facility-administered medications prior to visit.     ROS Review of Systems  Constitutional: Negative for chills and fever.  Eyes: Negative for visual disturbance.  Respiratory: Negative for shortness of breath.   Cardiovascular: Negative for chest pain.  Gastrointestinal: Negative for abdominal pain and blood in stool.  Genitourinary: Positive for menstrual problem.  Musculoskeletal: Negative for arthralgias and back pain.  Skin: Negative for rash.  Allergic/Immunologic: Negative for immunocompromised state.  Hematological: Negative for adenopathy. Does not bruise/bleed easily.  Psychiatric/Behavioral: Negative for dysphoric mood and suicidal ideas.    Objective:  BP 115/76 (BP Location: Left Wrist, Patient Position: Sitting, Cuff Size: Small)   Pulse 81   Temp 98 F (36.7 C) (Oral)   Ht 5\' 3"  (1.6 m)   Wt (!) 411 lb 12.8 oz (186.8 kg)   LMP 01/12/2017   SpO2 100%   BMI 72.95 kg/m   BP/Weight 02/13/2017 01/31/2017 01/10/2017  Systolic BP 115  112 128  Diastolic BP 76 75 80  Wt. (Lbs) 411.8 412.6 407.2  BMI 72.95 73.09 72.13    Physical Exam  Constitutional: She appears well-developed and well-nourished. No distress.  Cardiovascular: Normal rate, regular rhythm, normal heart sounds and intact distal pulses.   Pulmonary/Chest: Effort normal and breath sounds normal.    Musculoskeletal: She exhibits no edema.  Lymphadenopathy:       Right: No inguinal adenopathy present.       Left: No inguinal adenopathy present.  Skin: Skin is warm and dry. No rash noted.    U preg: negative   Lab Results  Component Value Date   WBC 7.7 12/30/2016   HGB 10.1 (L) 12/30/2016   HCT 33.0 (L) 12/30/2016   MCV 63.2 (L) 12/30/2016   PLT 374 12/30/2016   Depression screen PHQ 2/9 02/13/2017 01/31/2017 01/10/2017  Decreased Interest 3 2 3   Down, Depressed, Hopeless 2 2 3   PHQ - 2 Score 5 4 6   Altered sleeping 3 3 3   Tired, decreased energy 2 3 3   Change in appetite 2 2 3   Feeling bad or failure about yourself  3 1 3   Trouble concentrating 3 3 3   Moving slowly or fidgety/restless 1 1 2   Suicidal thoughts 0 0 0  PHQ-9 Score 19 17 23   Some recent data might be hidden   GAD 7 : Generalized Anxiety Score 02/13/2017 01/31/2017 01/10/2017 07/11/2016  Nervous, Anxious, on Edge 2 0 2 2  Control/stop worrying 1 1 1 1   Worry too much - different things 2 1 3 2   Trouble relaxing 1 0 2 1  Restless 2 1 1 2   Easily annoyed or irritable 3 3 3 3   Afraid - awful might happen 2 0 2 3  Total GAD 7 Score 13 6 14 14     Assessment & Plan:  [name] was seen today for menorrhagia.  Diagnoses and all orders for this visit:  MENORRHAGIA -     POCT urine pregnancy -     CBC -     medroxyPROGESTERone (PROVERA) 10 MG tablet; Take 1 tablet (10 mg total) by mouth daily. For 10 days  Chronic seasonal allergic rhinitis, unspecified trigger -     cetirizine (ZYRTEC) 10 MG tablet; Take 1 tablet (10 mg total) by mouth daily.  Depression, unspecified depression type -     buPROPion (WELLBUTRIN XL) 300 MG 24 hr tablet; Take 1 tablet (300 mg total) by mouth daily.  Morbid obesity with body mass index of 70 and over in adult (HCC) -     buPROPion (WELLBUTRIN XL) 300 MG 24 hr tablet; Take 1 tablet (300 mg total) by mouth daily.  Microcytic anemia -     ferrous sulfate 325 (65 FE) MG  tablet; Take 1 tablet (325 mg total) by mouth 3 (three) times daily with meals. -     Iron and TIBC -     Ferritin   There are no diagnoses linked to this encounter.  No orders of the defined types were placed in this encounter.   Follow-up: Return in about 2 weeks (around 02/27/2017) for prolonged menstraul bleeding .   Dessa Phi MD

## 2017-02-13 NOTE — Telephone Encounter (Signed)
Pt was called and informed of CT scan results. 

## 2017-02-13 NOTE — Assessment & Plan Note (Signed)
Increase Wellbutrin to 300 mg XL

## 2017-02-13 NOTE — Assessment & Plan Note (Signed)
Menorrhagia Obese patient Has hx of IDA requiring blood transfusion and IV iron in past  Plan: Provera x 10 days CBC Iron studies

## 2017-02-13 NOTE — Progress Notes (Signed)
Pt is here today for menstrual cycle that has lasted more than 1 month.

## 2017-02-13 NOTE — Patient Instructions (Addendum)
[name] was seen today for menorrhagia.  Diagnoses and all orders for this visit:  MENORRHAGIA -     POCT urine pregnancy -     CBC -     medroxyPROGESTERone (PROVERA) 10 MG tablet; Take 1 tablet (10 mg total) by mouth daily. For 10 days  Chronic seasonal allergic rhinitis, unspecified trigger -     cetirizine (ZYRTEC) 10 MG tablet; Take 1 tablet (10 mg total) by mouth daily.  Depression, unspecified depression type -     buPROPion (WELLBUTRIN XL) 300 MG 24 hr tablet; Take 1 tablet (300 mg total) by mouth daily.  Morbid obesity with body mass index of 70 and over in adult (HCC) -     buPROPion (WELLBUTRIN XL) 300 MG 24 hr tablet; Take 1 tablet (300 mg total) by mouth daily.  Microcytic anemia -     ferrous sulfate 325 (65 FE) MG tablet; Take 1 tablet (325 mg total) by mouth 3 (three) times daily with meals. -     Iron and TIBC -     Ferritin   F/u in 2 weeks for prolonged menstrual bleeding  Dr. Armen PickupFunches

## 2017-02-18 ENCOUNTER — Encounter (HOSPITAL_COMMUNITY): Payer: Self-pay

## 2017-02-18 ENCOUNTER — Emergency Department (HOSPITAL_COMMUNITY)
Admission: EM | Admit: 2017-02-18 | Discharge: 2017-02-19 | Disposition: A | Discharge status: Home / Self Care | Payer: Self-pay | Attending: Emergency Medicine | Admitting: Emergency Medicine

## 2017-02-18 DIAGNOSIS — F909 Attention-deficit hyperactivity disorder, unspecified type: Secondary | ICD-10-CM | POA: Insufficient documentation

## 2017-02-18 DIAGNOSIS — K0889 Other specified disorders of teeth and supporting structures: Secondary | ICD-10-CM | POA: Insufficient documentation

## 2017-02-18 DIAGNOSIS — J45909 Unspecified asthma, uncomplicated: Secondary | ICD-10-CM | POA: Insufficient documentation

## 2017-02-18 DIAGNOSIS — R519 Headache, unspecified: Secondary | ICD-10-CM

## 2017-02-18 DIAGNOSIS — R51 Headache: Secondary | ICD-10-CM | POA: Insufficient documentation

## 2017-02-18 DIAGNOSIS — Z79899 Other long term (current) drug therapy: Secondary | ICD-10-CM | POA: Insufficient documentation

## 2017-02-18 HISTORY — DX: Attention-deficit hyperactivity disorder, unspecified type: F90.9

## 2017-02-18 HISTORY — DX: Gastro-esophageal reflux disease without esophagitis: K21.9

## 2017-02-18 MED ORDER — KETOROLAC TROMETHAMINE 30 MG/ML IJ SOLN
30.0000 mg | Freq: Once | INTRAMUSCULAR | Status: AC
  Administered 2017-02-18: 30 mg via INTRAMUSCULAR
  Filled 2017-02-18: qty 1

## 2017-02-18 MED ORDER — DIPHENHYDRAMINE HCL 25 MG PO CAPS
25.0000 mg | ORAL_CAPSULE | Freq: Once | ORAL | Status: AC
Start: 2017-02-18 — End: 2017-02-18
  Administered 2017-02-18: 25 mg via ORAL
  Filled 2017-02-18: qty 1

## 2017-02-18 MED ORDER — METOCLOPRAMIDE HCL 10 MG PO TABS
10.0000 mg | ORAL_TABLET | Freq: Once | ORAL | Status: AC
  Administered 2017-02-18: 10 mg via ORAL
  Filled 2017-02-18: qty 1

## 2017-02-18 NOTE — ED Triage Notes (Signed)
Onset today headache.  Onset 2 weeks right lower molar pain, pt thinks she has a cavity.  Onset 2 days cough, sinus congestion.  No respiratory difficulties.

## 2017-02-19 MED ORDER — PENICILLIN V POTASSIUM 500 MG PO TABS: 500.0000 mg | ORAL_TABLET | Freq: Four times a day (QID) | ORAL | 0 refills | Status: DC

## 2017-02-19 NOTE — ED Provider Notes (Signed)
[facility]-EMERGENCY DEPT Provider Note   CSN: [id number] Arrival date & time: 02/18/17  2114     History   Chief Complaint Chief Complaint  Patient presents with  . Headache  . Dental Pain  . URI    HPI [name] is a 26 y.o. female.  Patient presents emergency department with chief complaint of headache and dental pain. She states that she has been having the headache for most today. Has been gradually worsening. She also complaints of poor dentition. She thinks this is contributing to her headache. She denies any fevers, or chills. Denies any neck stiffness. She denies any weakness, numbness, or tingling. She has not taken anything for symptoms. There are no modifying factors.   The history is provided by the patient. No language interpreter was used.    Past Medical History:  Diagnosis Date  . Acid reflux   . ADHD   . Asthma   . Clotting disorder (HCC)   . 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 Exogenous obesity   . Narcolepsy cataplexy syndrome 2007   Prior sleep studies starting on September 26 2005, AHI 5.3 per hour,  . Poor sleep hygiene     Patient Active Problem List   Diagnosis Date Noted  . Intercostal pain 08/25/2016  . Knee pain, bilateral 07/14/2016  . Venous stasis dermatitis of both lower extremities 05/26/2016  . Stye external 03/25/2016  . Plantar fasciitis, right 03/25/2016  . Leg swelling 11/17/2015  . Headache 09/08/2015  . Pain of molar 05/26/2015  . Sebaceous cyst of left axilla 04/13/2015  . Vitamin D deficiency 03/29/2015  . Allergic rhinitis 04/11/2010  . Microcytic anemia 03/09/2010  . Esophageal reflux 03/24/2009  . MENORRHAGIA 03/17/2009  . Morbid obesity with body mass index of 70 and over in adult [facility]) 05/15/2007  . Depression 02/12/2007  . 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 none      OB History    No data available       Home Medications    Prior to Admission medications   Medication Sig Start Date End Date Taking? Authorizing Provider  buPROPion (WELLBUTRIN XL) 300 MG 24 hr tablet Take 1 tablet (300 mg total) by mouth daily. 02/13/17  Yes Josalyn Funches, MD  cetirizine (ZYRTEC) 10 MG tablet Take 1 tablet (10 mg total) by mouth daily. 02/13/17  Yes Josalyn Funches, MD  ferrous sulfate 325 (65 FE) MG tablet Take 1 tablet (325 mg total) by mouth 3 (three) times daily with meals. 02/13/17  Yes Josalyn Funches, MD  ibuprofen (ADVIL,MOTRIN) 800 MG tablet Take 1 tablet (800 mg total) by mouth every 8 (eight) hours as needed for mild pain. 08/23/16  Yes Josalyn Funches, MD  medroxyPROGESTERone (PROVERA) 10 MG tablet Take 1 tablet (10 mg total) by mouth daily. For 10 days 02/13/17  Yes Dessa Phi, MD  metFORMIN (GLUCOPHAGE XR) 500 MG 24 hr tablet Take 1 tablet (500 mg total) by mouth daily after supper. 01/10/17  Yes Josalyn Funches, MD  omeprazole (PRILOSEC) 20 MG capsule Take 2 capsules (40 mg total) by mouth daily before supper. 07/11/16  Yes Josalyn Funches, MD  Armodafinil (NUVIGIL) 150 MG tablet Take 1 tablet (150 mg total) by mouth daily. Patient not taking: Reported on 02/13/2017 01/31/17   Josalyn  Funches, MD  penicillin v potassium (VEETID) 500 MG tablet Take 1 tablet (500 mg total) by mouth 4 (four) times daily. 02/19/17   Roxy Horseman, PA-C    Family History Family History  Problem Relation Age of Onset  . Hypertension Mother   . Asthma Mother   . Alcohol abuse Father   . Asthma Brother   . Alcohol abuse Maternal Grandmother     Social History Social History  Substance Use Topics  . Smoking status: Never Smoker  . Smokeless tobacco: Never Used  . Alcohol use Yes     Comment: ocassional     Allergies   Banana   Review of Systems Review of Systems  All other  systems reviewed and are negative.    Physical Exam Updated Vital Signs BP 133/74   Pulse 89   Temp 98 F (36.7 C) (Oral)   Resp 16   LMP 02/13/2017   SpO2 100%   Physical Exam  Constitutional: She is oriented to person, place, and time. She appears well-developed and well-nourished.  HENT:  Head: Normocephalic and atraumatic.  Right Ear: External ear normal.  Left Ear: External ear normal.  Poor dentition throughout. No signs of peritonsillar or tonsillar abscess.  No signs of gingival abscess. Oropharynx is clear and without exudates.  Uvula is midline.  Airway is intact. No signs of Ludwig's angina with palpation of oral and sublingual mucosa.   Eyes: Conjunctivae and EOM are normal. Pupils are equal, round, and reactive to light.  Neck: Normal range of motion. Neck supple.  No pain with neck flexion, no meningismus  Cardiovascular: Normal rate, regular rhythm and normal heart sounds.  Exam reveals no gallop and no friction rub.   No murmur heard. Pulmonary/Chest: Effort normal and breath sounds normal. No respiratory distress. She has no wheezes. She has no rales. She exhibits no tenderness.  Abdominal: Soft. She exhibits no distension and no mass. There is no tenderness. There is no rebound and no guarding.  Musculoskeletal: Normal range of motion. She exhibits no edema or tenderness.  Normal gait.  Neurological: She is alert and oriented to person, place, and time. She has normal reflexes.  CN 3-12 intact, normal finger to nose, no pronator drift, sensation and strength intact bilaterally.  Skin: Skin is warm and dry.  Psychiatric: She has a normal mood and affect. Her behavior is normal. Judgment and thought content normal.  Nursing note and vitals reviewed.    ED Treatments / Results  Labs (all labs ordered are listed, but only abnormal results are displayed) Labs Reviewed - No data to display  EKG  EKG Interpretation None       Radiology No results  found.  Procedures Procedures (including critical care time)  Medications Ordered in ED Medications  ketorolac (TORADOL) 30 MG/ML injection 30 mg (30 mg Intramuscular Given 02/18/17 2353)  metoCLOPramide (REGLAN) tablet 10 mg (10 mg Oral Given 02/18/17 2353)  diphenhydrAMINE (BENADRYL) capsule 25 mg (25 mg Oral Given 02/18/17 2353)     Initial Impression / Assessment and Plan / ED Course  I have reviewed the triage vital signs and the nursing notes.  Pertinent labs & imaging results that were available during my care of the patient were reviewed by me and considered in my medical decision making (see chart for details).     Pt HA treated and improved while in ED.  Presentation is like pts typical HA and non concerning for [facility], ICH, Meningitis, or temporal arteritis. Pt  is afebrile with no focal neuro deficits, nuchal rigidity, or change in vision. Pt is to follow up with PCP to discuss prophylactic medication.   Patient with dentalgia.  No abscess requiring immediate incision and drainage.  Exam not concerning for Ludwig's angina or pharyngeal abscess.  Will treat with penicillin. Pt instructed to follow-up with dentist.    Pt verbalizes understanding and is agreeable with plan to dc.    Final Clinical Impressions(s) / ED Diagnoses   Final diagnoses:  Nonintractable headache, unspecified chronicity pattern, unspecified headache type  Pain, dental    New Prescriptions New Prescriptions   PENICILLIN V POTASSIUM (VEETID) 500 MG TABLET    Take 1 tablet (500 mg total) by mouth 4 (four) times daily.     Roxy Horsemanobert Leitha Hyppolite, PA-C 02/19/17 [id number]    Tomasita CrumbleAdeleke Oni, MD 02/19/17 [phone]

## 2017-02-20 MED FILL — PENICILLIN VK 500 MG TABLET: 500 | 10 days supply | Qty: 40 | Fill #0

## 2017-02-25 ENCOUNTER — Telehealth: Payer: Self-pay | Admitting: Family Medicine

## 2017-02-25 MED ORDER — SODIUM CHLORIDE 0.9 % IV SOLN: 1000.0000 mg | Freq: Once | INTRAVENOUS | Status: DC

## 2017-02-25 NOTE — Addendum Note (Signed)
Addended by: Dessa PhiFUNCHES, Topacio Cella on: 02/25/2017 11:16 AM   Modules accepted: Orders

## 2017-02-25 NOTE — Telephone Encounter (Signed)
Pt was called and informed of appointment for IV iron infusion.

## 2017-02-25 NOTE — Telephone Encounter (Signed)
Patient worsening iron deficiency anemia  Called [facility] to schedule IV iron infusion (Feraheme 510 mg x 2)  [name]   An order form will be faxed over and signed  Please call patient to inform her of appointment She will have two appointments for iron infusion, 1 week apart  Takes about 15 minutes to complete the infusion once started

## 2017-02-27 ENCOUNTER — Other Ambulatory Visit (HOSPITAL_COMMUNITY): Payer: Self-pay

## 2017-02-28 ENCOUNTER — Ambulatory Visit (HOSPITAL_COMMUNITY)
Admission: RE | Admit: 2017-02-28 | Discharge: 2017-02-28 | Disposition: A | Discharge status: Home / Self Care | Payer: Self-pay | Source: Ambulatory Visit | Attending: Family Medicine | Admitting: Family Medicine

## 2017-02-28 ENCOUNTER — Encounter: Payer: Self-pay | Admitting: Family Medicine

## 2017-02-28 ENCOUNTER — Ambulatory Visit: Payer: Self-pay | Attending: Family Medicine | Admitting: Family Medicine

## 2017-02-28 VITALS — BP 96/65 | HR 85 | Temp 98.1°F | Ht 63.0 in | Wt >= 6400 oz

## 2017-02-28 DIAGNOSIS — R51 Headache: Secondary | ICD-10-CM | POA: Insufficient documentation

## 2017-02-28 DIAGNOSIS — F411 Generalized anxiety disorder: Secondary | ICD-10-CM | POA: Insufficient documentation

## 2017-02-28 DIAGNOSIS — D509 Iron deficiency anemia, unspecified: Secondary | ICD-10-CM | POA: Insufficient documentation

## 2017-02-28 DIAGNOSIS — G47419 Narcolepsy without cataplexy: Secondary | ICD-10-CM | POA: Insufficient documentation

## 2017-02-28 DIAGNOSIS — N920 Excessive and frequent menstruation with regular cycle: Secondary | ICD-10-CM | POA: Insufficient documentation

## 2017-02-28 DIAGNOSIS — G4733 Obstructive sleep apnea (adult) (pediatric): Secondary | ICD-10-CM | POA: Insufficient documentation

## 2017-02-28 DIAGNOSIS — F329 Major depressive disorder, single episode, unspecified: Secondary | ICD-10-CM | POA: Insufficient documentation

## 2017-02-28 LAB — POCT URINE PREGNANCY: Preg Test, Ur: NEGATIVE

## 2017-02-28 MED ORDER — MEDROXYPROGESTERONE ACETATE 10 MG PO TABS: 10.0000 mg | ORAL_TABLET | Freq: Every day | ORAL | 0 refills | Status: DC

## 2017-02-28 MED ORDER — SODIUM CHLORIDE 0.9 % IV SOLN
510.0000 mg | INTRAVENOUS | Status: DC
  Administered 2017-02-28: 510 mg via INTRAVENOUS
  Filled 2017-02-28: qty 17

## 2017-02-28 MED ORDER — MEDROXYPROGESTERONE ACETATE 150 MG/ML IM SUSP
150.0000 mg | Freq: Once | INTRAMUSCULAR | Status: AC
  Administered 2017-02-28: 150 mg via INTRAMUSCULAR

## 2017-02-28 NOTE — Assessment & Plan Note (Signed)
A; improved with provera. Patient at high risk due to IDA P: Second feraheme next week Depo provera shot today f/u in 4 weeks for iron studies, order placed

## 2017-02-28 NOTE — Progress Notes (Signed)
Subjective:  Patient ID: [name], female    DOB: March 27, 1991  Age: 26 y.o. MRN: [id number]  CC: Follow-up   HPI [name] has morbid obesity, narcolepsy, OSA, ADD, depression, chronic headache, intermittent menorrhagia with IDA anemia ( Hgb 6.8 01/2016) she presents for   1. Prolonged menstrual bleeding: x 1 month. Bleeding stopped with provera x 10 days. Then returned. It stopped again 2 days ago. She had IV iron infusion today for IDA. She reports intermittent headache. No active vaginal bleeding. She tolerated provera. She is not sexually active.   Social History  Substance Use Topics  . Smoking status: Never Smoker  . Smokeless tobacco: Never Used  . Alcohol use Yes     Comment: ocassional    Outpatient Medications Prior to Visit  Medication Sig Dispense Refill  . buPROPion (WELLBUTRIN XL) 300 MG 24 hr tablet Take 1 tablet (300 mg total) by mouth daily. 30 tablet 5  . cetirizine (ZYRTEC) 10 MG tablet Take 1 tablet (10 mg total) by mouth daily. 30 tablet 11  . ferrous sulfate 325 (65 FE) MG tablet Take 1 tablet (325 mg total) by mouth 3 (three) times daily with meals. 90 tablet 5  . ibuprofen (ADVIL,MOTRIN) 800 MG tablet Take 1 tablet (800 mg total) by mouth every 8 (eight) hours as needed for mild pain. 30 tablet 0  . metFORMIN (GLUCOPHAGE XR) 500 MG 24 hr tablet Take 1 tablet (500 mg total) by mouth daily after supper. 30 tablet 2  . omeprazole (PRILOSEC) 20 MG capsule Take 2 capsules (40 mg total) by mouth daily before supper. 60 capsule 5  . penicillin v potassium (VEETID) 500 MG tablet Take 1 tablet (500 mg total) by mouth 4 (four) times daily. 40 tablet 0  . Armodafinil (NUVIGIL) 150 MG tablet Take 1 tablet (150 mg total) by mouth daily. (Patient not taking: Reported on 02/13/2017) 30 tablet 0  . medroxyPROGESTERone (PROVERA) 10 MG tablet Take 1 tablet (10 mg total) by mouth daily. For 10 days (Patient not taking: Reported on 02/28/2017) 10 tablet 0    Facility-Administered Medications Prior to Visit  Medication Dose Route Frequency Provider Last Rate Last Dose  . ferumoxytol (FERAHEME) 510 mg in sodium chloride 0.9 % 100 mL IVPB  510 mg Intravenous Weekly Purl Claytor, MD   510 mg at 02/28/17 1228    ROS Review of Systems  Constitutional: Negative for chills and fever.  Eyes: Negative for visual disturbance.  Respiratory: Negative for shortness of breath.   Cardiovascular: Negative for chest pain.  Gastrointestinal: Negative for abdominal pain and blood in stool.  Genitourinary: Positive for menstrual problem.  Musculoskeletal: Negative for arthralgias and back pain.  Skin: Negative for rash.  Allergic/Immunologic: Negative for immunocompromised state.  Hematological: Negative for adenopathy. Does not bruise/bleed easily.  Psychiatric/Behavioral: Negative for dysphoric mood and suicidal ideas.    Objective:  BP 96/65   Pulse 85   Temp 98.1 F (36.7 C) (Oral)   Ht 5\' 3"  (1.6 m)   Wt (!) 401 lb 12.8 oz (182.3 kg)   LMP 02/13/2017   SpO2 100%   BMI 71.18 kg/m   BP/Weight 02/28/2017 02/28/2017 02/19/2017  Systolic BP 110 96 124  Diastolic BP 61 65 75  Wt. (Lbs) 400 401.8 -  BMI 70.86 71.18 -    Physical Exam  Constitutional: She appears well-developed and well-nourished. No distress.  Morbidly obese   Cardiovascular: Normal rate, regular rhythm, normal heart sounds and intact distal  pulses.   Pulmonary/Chest: Effort normal and breath sounds normal.  Musculoskeletal: She exhibits no edema.  Lymphadenopathy:       Right: No inguinal adenopathy present.       Left: No inguinal adenopathy present.  Skin: Skin is warm and dry. No rash noted.   U preg: negative   Lab Results  Component Value Date   WBC 7.7 02/13/2017   HGB 8.1 (L) 02/13/2017   HCT 27.6 (L) 02/13/2017   MCV 63.6 (L) 02/13/2017   PLT 409 (H) 02/13/2017   Depression screen [facility] 2/9 02/28/2017 02/13/2017 01/31/2017  Decreased Interest 2 3 2   Down,  Depressed, Hopeless 1 2 2   PHQ - 2 Score 3 5 4   Altered sleeping 3 3 3   Tired, decreased energy 1 2 3   Change in appetite 1 2 2   Feeling bad or failure about yourself  1 3 1   Trouble concentrating 3 3 3   Moving slowly or fidgety/restless 1 1 1   Suicidal thoughts 0 0 0  PHQ-9 Score 13 19 17   Some recent data might be hidden   GAD 7 : Generalized Anxiety Score 02/28/2017 02/13/2017 01/31/2017 01/10/2017  Nervous, Anxious, on Edge 2 2 0 2  Control/stop worrying 2 1 1 1   Worry too much - different things 2 2 1 3   Trouble relaxing 1 1 0 2  Restless 1 2 1 1   Easily annoyed or irritable 3 3 3 3   Afraid - awful might happen 1 2 0 2  Total GAD 7 Score 12 13 6 14     Assessment & Plan:  [name] was seen today for follow-up.  Diagnoses and all orders for this visit:  Iron deficiency anemia, unspecified iron deficiency anemia type -     Iron and TIBC; Future -     Ferritin; Future -     CBC; Future  MENORRHAGIA -     Discontinue: medroxyPROGESTERone (PROVERA) 10 MG tablet; Take 1 tablet (10 mg total) by mouth daily. For 10 days -     medroxyPROGESTERone (DEPO-PROVERA) injection 150 mg; Inject 1 mL (150 mg total) into the muscle once. -     POCT urine pregnancy   There are no diagnoses linked to this encounter.  No orders of the defined types were placed in this encounter.   Follow-up: Return in about 4 weeks (around 03/28/2017) for iron studies and CBC, lab draw .   Dessa PhiJosalyn Guhan Bruington MD

## 2017-02-28 NOTE — Discharge Instructions (Signed)

## 2017-02-28 NOTE — Patient Instructions (Addendum)
[name] was seen today for follow-up.  Diagnoses and all orders for this visit:  Iron deficiency anemia, unspecified iron deficiency anemia type -     Iron and TIBC; Future -     Ferritin; Future -     CBC; Future  MENORRHAGIA -     Discontinue: medroxyPROGESTERone (PROVERA) 10 MG tablet; Take 1 tablet (10 mg total) by mouth daily. For 10 days -     medroxyPROGESTERone (DEPO-PROVERA) injection 150 mg; Inject 1 mL (150 mg total) into the muscle once. -     POCT urine pregnancy   Be sure to get second iron infusion  Next depo shot in 3 months Call and check on nuvigil  f/u in 4 weeks for labs, anemia  f/u in 3 months sooner if needed for office visit   Dr. Armen PickupFunches

## 2017-03-05 ENCOUNTER — Other Ambulatory Visit (HOSPITAL_COMMUNITY): Payer: Self-pay | Admitting: *Deleted

## 2017-03-06 ENCOUNTER — Ambulatory Visit (HOSPITAL_COMMUNITY)
Admission: RE | Admit: 2017-03-06 | Discharge: 2017-03-06 | Disposition: A | Discharge status: Home / Self Care | Payer: Self-pay | Source: Ambulatory Visit | Attending: Family Medicine | Admitting: Family Medicine

## 2017-03-06 DIAGNOSIS — D509 Iron deficiency anemia, unspecified: Secondary | ICD-10-CM | POA: Insufficient documentation

## 2017-03-06 MED ORDER — SODIUM CHLORIDE 0.9 % IV SOLN
510.0000 mg | INTRAVENOUS | Status: DC
  Administered 2017-03-06: 14:00:00 510 mg via INTRAVENOUS
  Filled 2017-03-06: qty 17

## 2017-03-10 ENCOUNTER — Ambulatory Visit (HOSPITAL_COMMUNITY): Payer: Self-pay | Admitting: Psychiatry

## 2017-03-12 ENCOUNTER — Ambulatory Visit: Payer: Self-pay

## 2017-03-17 ENCOUNTER — Ambulatory Visit: Payer: Self-pay

## 2017-03-28 ENCOUNTER — Ambulatory Visit: Payer: Self-pay

## 2017-03-28 ENCOUNTER — Other Ambulatory Visit: Payer: Self-pay

## 2017-03-28 NOTE — Addendum Note (Signed)
Addended by: Dessa Phi on: 03/28/2017 11:22 AM   Modules accepted: Orders

## 2017-04-04 ENCOUNTER — Ambulatory Visit: Payer: Self-pay | Admitting: Family Medicine

## 2017-04-04 ENCOUNTER — Ambulatory Visit: Payer: Self-pay

## 2017-04-25 ENCOUNTER — Telehealth: Payer: Self-pay | Admitting: Family Medicine

## 2017-04-25 NOTE — Telephone Encounter (Signed)
PT call to request a letter of excuse for jury duty, due to her condition, she asking you to call her back to give you the complete information, please follow up

## 2017-04-28 NOTE — Telephone Encounter (Signed)
PT called again about the request for a letter for jury duty excuse...please follow up

## 2017-04-29 NOTE — Telephone Encounter (Signed)
Please inform patient Letter for jury duty ready for pick up

## 2017-04-29 NOTE — Telephone Encounter (Signed)
Will route to PCP 

## 2017-04-29 NOTE — Telephone Encounter (Signed)
Pt was called and informed of letter being ready for pick up. 

## 2017-04-30 ENCOUNTER — Encounter: Payer: Self-pay | Admitting: Family Medicine

## 2017-06-02 ENCOUNTER — Ambulatory Visit: Payer: Self-pay | Attending: Family Medicine | Admitting: Family Medicine

## 2017-06-02 VITALS — BP 99/66 | HR 74 | Temp 98.2°F | Resp 18 | Ht 63.0 in | Wt >= 6400 oz

## 2017-06-02 DIAGNOSIS — N920 Excessive and frequent menstruation with regular cycle: Secondary | ICD-10-CM | POA: Insufficient documentation

## 2017-06-02 DIAGNOSIS — G47411 Narcolepsy with cataplexy: Secondary | ICD-10-CM | POA: Insufficient documentation

## 2017-06-02 DIAGNOSIS — K219 Gastro-esophageal reflux disease without esophagitis: Secondary | ICD-10-CM | POA: Insufficient documentation

## 2017-06-02 DIAGNOSIS — R0782 Intercostal pain: Secondary | ICD-10-CM

## 2017-06-02 DIAGNOSIS — Z3042 Encounter for surveillance of injectable contraceptive: Secondary | ICD-10-CM

## 2017-06-02 DIAGNOSIS — G4733 Obstructive sleep apnea (adult) (pediatric): Secondary | ICD-10-CM | POA: Insufficient documentation

## 2017-06-02 DIAGNOSIS — G47419 Narcolepsy without cataplexy: Secondary | ICD-10-CM

## 2017-06-02 DIAGNOSIS — G43909 Migraine, unspecified, not intractable, without status migrainosus: Secondary | ICD-10-CM | POA: Insufficient documentation

## 2017-06-02 DIAGNOSIS — Z7984 Long term (current) use of oral hypoglycemic drugs: Secondary | ICD-10-CM | POA: Insufficient documentation

## 2017-06-02 DIAGNOSIS — J45909 Unspecified asthma, uncomplicated: Secondary | ICD-10-CM | POA: Insufficient documentation

## 2017-06-02 DIAGNOSIS — Z79899 Other long term (current) drug therapy: Secondary | ICD-10-CM | POA: Insufficient documentation

## 2017-06-02 DIAGNOSIS — F909 Attention-deficit hyperactivity disorder, unspecified type: Secondary | ICD-10-CM | POA: Insufficient documentation

## 2017-06-02 DIAGNOSIS — G43709 Chronic migraine without aura, not intractable, without status migrainosus: Secondary | ICD-10-CM | POA: Insufficient documentation

## 2017-06-02 DIAGNOSIS — Z888 Allergy status to other drugs, medicaments and biological substances status: Secondary | ICD-10-CM | POA: Insufficient documentation

## 2017-06-02 DIAGNOSIS — M7989 Other specified soft tissue disorders: Secondary | ICD-10-CM | POA: Insufficient documentation

## 2017-06-02 DIAGNOSIS — Z6841 Body Mass Index (BMI) 40.0 and over, adult: Secondary | ICD-10-CM | POA: Insufficient documentation

## 2017-06-02 DIAGNOSIS — D500 Iron deficiency anemia secondary to blood loss (chronic): Secondary | ICD-10-CM | POA: Insufficient documentation

## 2017-06-02 LAB — POCT URINE PREGNANCY: PREG TEST UR: NEGATIVE

## 2017-06-02 MED ORDER — IBUPROFEN 800 MG PO TABS: 800.0000 mg | ORAL_TABLET | Freq: Three times a day (TID) | ORAL | 0 refills | Status: DC | PRN

## 2017-06-02 MED ORDER — MEDROXYPROGESTERONE ACETATE 150 MG/ML IM SUSP
150.0000 mg | Freq: Once | INTRAMUSCULAR | Status: AC
  Administered 2017-06-02: 150 mg via INTRAMUSCULAR

## 2017-06-02 MED ORDER — OMEPRAZOLE 20 MG PO CPDR: 40.0000 mg | DELAYED_RELEASE_CAPSULE | Freq: Every day | ORAL | 5 refills | Status: DC

## 2017-06-02 MED ORDER — ARMODAFINIL 150 MG PO TABS: 150.0000 mg | ORAL_TABLET | Freq: Every day | ORAL | 3 refills | Status: DC

## 2017-06-02 MED ORDER — FUROSEMIDE 20 MG PO TABS: 20.0000 mg | ORAL_TABLET | Freq: Every day | ORAL | 0 refills | Status: DC

## 2017-06-02 MED FILL — ?OMEPRAZOLE DR 20 MG CAPSUL: 20 | 30 days supply | Qty: 60 | Fill #0

## 2017-06-02 MED FILL — ?FUROSEMIDE 20 MG TABLET: 20 | 14 days supply | Qty: 14 | Fill #0

## 2017-06-02 MED FILL — IBUPROFEN 800 MG TABLET: 800 | 10 days supply | Qty: 30 | Fill #0

## 2017-06-02 NOTE — Progress Notes (Signed)
Patient is here for 3 month f/up

## 2017-06-02 NOTE — Progress Notes (Signed)
Subjective:  Patient ID: [name], female    DOB: February 17, 1991  Age: 26 y.o. MRN: [id number]  CC: Follow-up   HPI [name] is a 26 year old female with a history of morbid obesity, Obstructive sleep apnea, anemia secondary to menorrhagia ( last Hgb 8.1 in 02/2017 ; s/p Feraheme transfusion in the recent past), chronic migraines, narcolepsy who presents today to establish care with me. She was previously followed by Dr Armen PickupFunches.  She was commenced on the Depo-Provera injection which has helped with her menorrhagia. LMP was sometime last month and she is due for another Depo shot today. She denies lightheadedness, syncope.  She struggles to stay awake while talking to me today. Previously prescribed Nuvigil which she was unable to obtain due to the cost. She has a CPAP machine for sleep apnea which she seldom uses due to it being claustrophobic.  Requesting refill of ibuprofen which she takes for migraines; migraines are infrequent. She is concerned about swelling of her legs, left worse than right over the last 1 month but denies worsening shortness of breath or chest pains. She has had similar symptoms in the past and has had to use a diuretic to alleviate symptoms.    Past Medical History:  Diagnosis Date  . Acid reflux   . ADHD   . Asthma   . Clotting disorder (HCC)   . 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 Exogenous obesity   . Narcolepsy cataplexy syndrome 2007   Prior sleep studies starting on September 26 2005, AHI 5.3 per hour,  . Poor sleep hygiene     Past Surgical History:  Procedure Laterality Date  . none      Allergies  Allergen Reactions  . Banana Itching     Outpatient Medications Prior to Visit  Medication Sig Dispense Refill  . buPROPion (WELLBUTRIN XL) 300 MG 24 hr tablet Take 1 tablet (300 mg total) by mouth daily. 30 tablet 5  .  cetirizine (ZYRTEC) 10 MG tablet Take 1 tablet (10 mg total) by mouth daily. 30 tablet 11  . ferrous sulfate 325 (65 FE) MG tablet Take 1 tablet (325 mg total) by mouth 3 (three) times daily with meals. 90 tablet 5  . metFORMIN (GLUCOPHAGE XR) 500 MG 24 hr tablet Take 1 tablet (500 mg total) by mouth daily after supper. 30 tablet 2  . Armodafinil (NUVIGIL) 150 MG tablet Take 1 tablet (150 mg total) by mouth daily. (Patient not taking: Reported on 02/13/2017) 30 tablet 0  . ibuprofen (ADVIL,MOTRIN) 800 MG tablet Take 1 tablet (800 mg total) by mouth every 8 (eight) hours as needed for mild pain. 30 tablet 0  . omeprazole (PRILOSEC) 20 MG capsule Take 2 capsules (40 mg total) by mouth daily before supper. 60 capsule 5  . penicillin v potassium (VEETID) 500 MG tablet Take 1 tablet (500 mg total) by mouth 4 (four) times daily. 40 tablet 0   No facility-administered medications prior to visit.     ROS Review of Systems  Constitutional: Negative for activity change, appetite change and fatigue.  HENT: Negative for congestion, sinus pressure and sore throat.   Eyes: Negative for visual disturbance.  Respiratory: Negative for cough, chest tightness, shortness of breath and wheezing.   Cardiovascular: Negative for chest pain and palpitations.  Gastrointestinal: Negative for abdominal distention, abdominal pain and constipation.  Endocrine: Negative for polydipsia.  Genitourinary:  Negative for dysuria and frequency.  Musculoskeletal: Negative for arthralgias and back pain.  Skin: Negative for rash.  Neurological: Negative for tremors, light-headedness and numbness.  Hematological: Does not bruise/bleed easily.  Psychiatric/Behavioral: Negative for agitation and behavioral problems.    Objective:  BP 99/66 (BP Location: Left Arm, Patient Position: Sitting, Cuff Size: Normal)   Pulse 74   Temp 98.2 F (36.8 C) (Oral)   Resp 18   Ht 5\' 3"  (1.6 m)   Wt (!) 420 lb 3.2 oz (190.6 kg)   SpO2 98%    BMI 74.44 kg/m   BP/Weight 06/02/2017 03/06/2017 02/28/2017  Systolic BP 99 105 110  Diastolic BP 66 52 61  Wt. (Lbs) 420.2 400 400  BMI 74.44 70.86 70.86      Physical Exam  Constitutional: She is oriented to person, place, and time. She appears well-developed and well-nourished.  Morbidly obese  Cardiovascular: Normal rate, normal heart sounds and intact distal pulses.   No murmur heard. Pulmonary/Chest: Effort normal and breath sounds normal. She has no wheezes. She has no rales. She exhibits no tenderness.  Abdominal: Soft. Bowel sounds are normal. She exhibits no distension and no mass. There is no tenderness.  Musculoskeletal: Normal range of motion.  Neurological: She is alert and oriented to person, place, and time.  Skin: Skin is warm and dry.  Psychiatric: She has a normal mood and affect.     Assessment & Plan:   1. Morbid obesity with body mass index of 70 and over in adult [facility]) We have discussed reducing portion sizes,  2. Leg swelling Blood pressure is under low side-will give Lasix 42 weeks Advised to use compression stockings Low-sodium diet - furosemide (LASIX) 20 MG tablet; Take 1 tablet (20 mg total) by mouth daily.  Dispense: 14 tablet; Refill: 0  3. Narcolepsy without cataplexy Patient advised to stop at the pharmacy so medication can be ordered through the patient assistance program - Armodafinil (NUVIGIL) 150 MG tablet; Take 1 tablet (150 mg total) by mouth daily.  Dispense: 90 tablet; Refill: 3  4. Gastroesophageal reflux disease, esophagitis presence not specified Controlled - omeprazole (PRILOSEC) 20 MG capsule; Take 2 capsules (40 mg total) by mouth daily before supper.  Dispense: 60 capsule; Refill: 5  5. Chronic migraine without aura without status migrainosus, not intractable - ibuprofen (ADVIL,MOTRIN) 800 MG tablet; Take 1 tablet (800 mg total) by mouth every 8 (eight) hours as needed for mild pain.  Dispense: 30 tablet; Refill: 0  6. Iron  deficiency anemia due to chronic blood loss Secondary to menorrhagia which has stopped ever since she was placed on Depo Provera - CBC with Differential/Platelet  8. Encounter for surveillance of injectable contraceptive We have discussed the side effect of Depo coupled with the fact that she smokes and that she is at high risk of thrombotic events We have discussed the option of Mirena as management for menorrhagia however she declined this. Strongly advised to quit smoking   Meds ordered this encounter  Medications  . DISCONTD: omeprazole (PRILOSEC) 20 MG capsule    Sig: Take 2 capsules (40 mg total) by mouth daily before supper.    Dispense:  60 capsule    Refill:  5  . DISCONTD: ibuprofen (ADVIL,MOTRIN) 800 MG tablet    Sig: Take 1 tablet (800 mg total) by mouth every 8 (eight) hours as needed for mild pain.    Dispense:  30 tablet    Refill:  0  . Armodafinil (NUVIGIL)  150 MG tablet    Sig: Take 1 tablet (150 mg total) by mouth daily.    Dispense:  90 tablet    Refill:  3  . omeprazole (PRILOSEC) 20 MG capsule    Sig: Take 2 capsules (40 mg total) by mouth daily before supper.    Dispense:  60 capsule    Refill:  5  . ibuprofen (ADVIL,MOTRIN) 800 MG tablet    Sig: Take 1 tablet (800 mg total) by mouth every 8 (eight) hours as needed for mild pain.    Dispense:  30 tablet    Refill:  0  . furosemide (LASIX) 20 MG tablet    Sig: Take 1 tablet (20 mg total) by mouth daily.    Dispense:  14 tablet    Refill:  0    Follow-up: Return in about 3 months (around 09/02/2017) for Follow-up of chronic medical conditions.   This note has been created with Education officer, environmental. Any transcriptional errors are unintentional.     Jaclyn Shaggy MD

## 2017-06-03 ENCOUNTER — Telehealth: Payer: Self-pay

## 2017-06-03 ENCOUNTER — Encounter: Payer: Self-pay | Admitting: Family Medicine

## 2017-06-03 LAB — CBC WITH DIFFERENTIAL/PLATELET
BASOS ABS: 0 10*3/uL (ref 0.0–0.2)
BASOS: 1 %
EOS (ABSOLUTE): 0.2 10*3/uL (ref 0.0–0.4)
Eos: 2 %
HEMATOCRIT: 34.4 % (ref 34.0–46.6)
HEMOGLOBIN: 10.4 g/dL — AB (ref 11.1–15.9)
IMMATURE GRANS (ABS): 0 10*3/uL (ref 0.0–0.1)
Immature Granulocytes: 0 %
Lymphocytes Absolute: 2 10*3/uL (ref 0.7–3.1)
Lymphs: 32 %
MCH: 20.8 pg — AB (ref 26.6–33.0)
MCHC: 30.2 g/dL — ABNORMAL LOW (ref 31.5–35.7)
MCV: 69 fL — ABNORMAL LOW (ref 79–97)
Monocytes Absolute: 0.6 10*3/uL (ref 0.1–0.9)
Monocytes: 9 %
NEUTROS ABS: 3.5 10*3/uL (ref 1.4–7.0)
NEUTROS PCT: 56 %
Platelets: 360 10*3/uL (ref 150–379)
RBC: 5.01 x10E6/uL (ref 3.77–5.28)
RDW: 20 % — ABNORMAL HIGH (ref 12.3–15.4)
WBC: 6.3 10*3/uL (ref 3.4–10.8)

## 2017-06-03 NOTE — Telephone Encounter (Signed)
CMA call regarding lab results   Patient Verify DOB   Patient was aware and understood  

## 2017-06-03 NOTE — Telephone Encounter (Signed)
-----   Message from Jaclyn ShaggyEnobong Amao, MD sent at 06/03/2017  1:35 PM EDT ----- She is still anemic but this has improved compared to previous labs.

## 2017-06-12 ENCOUNTER — Ambulatory Visit: Payer: Self-pay | Attending: Family Medicine

## 2017-07-01 ENCOUNTER — Encounter (HOSPITAL_COMMUNITY): Payer: Self-pay | Admitting: Emergency Medicine

## 2017-07-01 ENCOUNTER — Emergency Department (HOSPITAL_COMMUNITY)
Admission: EM | Admit: 2017-07-01 | Discharge: 2017-07-02 | Disposition: A | Discharge status: Home / Self Care | Payer: Self-pay | Attending: Emergency Medicine | Admitting: Emergency Medicine

## 2017-07-01 ENCOUNTER — Emergency Department (HOSPITAL_COMMUNITY): Payer: Self-pay

## 2017-07-01 DIAGNOSIS — Z7984 Long term (current) use of oral hypoglycemic drugs: Secondary | ICD-10-CM | POA: Insufficient documentation

## 2017-07-01 DIAGNOSIS — Z791 Long term (current) use of non-steroidal anti-inflammatories (NSAID): Secondary | ICD-10-CM | POA: Insufficient documentation

## 2017-07-01 DIAGNOSIS — R112 Nausea with vomiting, unspecified: Secondary | ICD-10-CM | POA: Insufficient documentation

## 2017-07-01 DIAGNOSIS — Z79899 Other long term (current) drug therapy: Secondary | ICD-10-CM | POA: Insufficient documentation

## 2017-07-01 DIAGNOSIS — B9789 Other viral agents as the cause of diseases classified elsewhere: Secondary | ICD-10-CM | POA: Insufficient documentation

## 2017-07-01 DIAGNOSIS — F17210 Nicotine dependence, cigarettes, uncomplicated: Secondary | ICD-10-CM | POA: Insufficient documentation

## 2017-07-01 DIAGNOSIS — J45909 Unspecified asthma, uncomplicated: Secondary | ICD-10-CM | POA: Insufficient documentation

## 2017-07-01 DIAGNOSIS — J069 Acute upper respiratory infection, unspecified: Secondary | ICD-10-CM | POA: Insufficient documentation

## 2017-07-01 LAB — URINALYSIS, ROUTINE W REFLEX MICROSCOPIC
BILIRUBIN URINE: NEGATIVE
Glucose, UA: NEGATIVE mg/dL
KETONES UR: NEGATIVE mg/dL
LEUKOCYTES UA: NEGATIVE
Nitrite: NEGATIVE
PH: 5 (ref 5.0–8.0)
Protein, ur: NEGATIVE mg/dL
Specific Gravity, Urine: 1.032 — ABNORMAL HIGH (ref 1.005–1.030)

## 2017-07-01 LAB — COMPREHENSIVE METABOLIC PANEL
ALBUMIN: 3.4 g/dL — AB (ref 3.5–5.0)
ALT: 20 U/L (ref 14–54)
ANION GAP: 10 (ref 5–15)
AST: 18 U/L (ref 15–41)
Alkaline Phosphatase: 96 U/L (ref 38–126)
BUN: 9 mg/dL (ref 6–20)
CO2: 20 mmol/L — ABNORMAL LOW (ref 22–32)
Calcium: 9 mg/dL (ref 8.9–10.3)
Chloride: 108 mmol/L (ref 101–111)
Creatinine, Ser: 0.7 mg/dL (ref 0.44–1.00)
Glucose, Bld: 129 mg/dL — ABNORMAL HIGH (ref 65–99)
POTASSIUM: 3.9 mmol/L (ref 3.5–5.1)
SODIUM: 138 mmol/L (ref 135–145)
Total Bilirubin: 0.9 mg/dL (ref 0.3–1.2)
Total Protein: 7.8 g/dL (ref 6.5–8.1)

## 2017-07-01 LAB — CBC
HEMATOCRIT: 33.1 % — AB (ref 36.0–46.0)
Hemoglobin: 10.4 g/dL — ABNORMAL LOW (ref 12.0–15.0)
MCH: 21.6 pg — ABNORMAL LOW (ref 26.0–34.0)
MCHC: 31.4 g/dL (ref 30.0–36.0)
MCV: 68.8 fL — AB (ref 78.0–100.0)
PLATELETS: 328 10*3/uL (ref 150–400)
RBC: 4.81 MIL/uL (ref 3.87–5.11)
RDW: 17.5 % — ABNORMAL HIGH (ref 11.5–15.5)
WBC: 5.3 10*3/uL (ref 4.0–10.5)

## 2017-07-01 LAB — LIPASE, BLOOD: Lipase: 21 U/L (ref 11–51)

## 2017-07-01 LAB — RAPID STREP SCREEN (MED CTR MEBANE ONLY): Streptococcus, Group A Screen (Direct): NEGATIVE

## 2017-07-01 MED ORDER — IBUPROFEN 800 MG PO TABS
800.0000 mg | ORAL_TABLET | Freq: Once | ORAL | Status: AC
  Administered 2017-07-02: 800 mg via ORAL
  Filled 2017-07-01: qty 1

## 2017-07-01 MED ORDER — ONDANSETRON 4 MG PO TBDP
4.0000 mg | ORAL_TABLET | Freq: Once | ORAL | Status: AC
  Administered 2017-07-02: 4 mg via ORAL
  Filled 2017-07-01: qty 1

## 2017-07-01 NOTE — ED Provider Notes (Signed)
TIME SEEN: 11:17 PM  CHIEF COMPLAINT: Sore throat, cough, vomiting  HPI: Patient is a 26 year old female with history of asthma, narcolepsy who presents to the emergency department with complaints of several days of sore throat, productive cough, vomiting. Mother with similar symptoms. No diarrhea, abdominal pain. No fevers or chills. She is currently on her menstrual cycle. No recent travel. She [facility] and states "this is the only thing I can keep down".  ROS: See HPI Constitutional: no fever  Eyes: no drainage  ENT: no runny nose   Cardiovascular:  no chest pain  Resp: no SOB  GI:  vomiting GU: no dysuria Integumentary: no rash  Allergy: no hives  Musculoskeletal: no leg swelling  Neurological: no slurred speech ROS otherwise negative  PAST MEDICAL HISTORY/PAST SURGICAL HISTORY:  Past Medical History:  Diagnosis Date  . Acid reflux   . ADHD   . Asthma   . Clotting disorder (HCC)   . 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 Exogenous obesity   . Narcolepsy cataplexy syndrome 2007   Prior sleep studies starting on September 26 2005, AHI 5.3 per hour,  . Poor sleep hygiene     MEDICATIONS:  Prior to Admission medications   Medication Sig Start Date End Date Taking? Authorizing Provider  Armodafinil (NUVIGIL) 150 MG tablet Take 1 tablet (150 mg total) by mouth daily. 06/02/17   Jaclyn ShaggyAmao, Enobong, MD  buPROPion (WELLBUTRIN XL) 300 MG 24 hr tablet Take 1 tablet (300 mg total) by mouth daily. 02/13/17   Funches, Gerilyn NestleJosalyn, MD  cetirizine (ZYRTEC) 10 MG tablet Take 1 tablet (10 mg total) by mouth daily. 02/13/17   Funches, Gerilyn NestleJosalyn, MD  ferrous sulfate 325 (65 FE) MG tablet Take 1 tablet (325 mg total) by mouth 3 (three) times daily with meals. 02/13/17   Funches, Gerilyn NestleJosalyn, MD  furosemide (LASIX) 20 MG tablet Take 1 tablet (20 mg total) by mouth daily.  06/02/17   Jaclyn ShaggyAmao, Enobong, MD  ibuprofen (ADVIL,MOTRIN) 800 MG tablet Take 1 tablet (800 mg total) by mouth every 8 (eight) hours as needed for mild pain. 06/02/17   Jaclyn ShaggyAmao, Enobong, MD  metFORMIN (GLUCOPHAGE XR) 500 MG 24 hr tablet Take 1 tablet (500 mg total) by mouth daily after supper. 01/10/17   Funches, Gerilyn NestleJosalyn, MD  omeprazole (PRILOSEC) 20 MG capsule Take 2 capsules (40 mg total) by mouth daily before supper. 06/02/17   Jaclyn ShaggyAmao, Enobong, MD    ALLERGIES:  Allergies  Allergen Reactions  . Banana Itching    SOCIAL HISTORY:  Social History  Substance Use Topics  . Smoking status: Current Some Day Smoker    Types: Cigarettes  . Smokeless tobacco: Never Used  . Alcohol use Yes     Comment: ocassional    FAMILY HISTORY: Family History  Problem Relation Age of Onset  . Hypertension Mother   . Asthma Mother   . Alcohol abuse Father   . Asthma Brother   . Alcohol abuse Maternal Grandmother     EXAM: BP (!) 109/51   Pulse 71   Temp 98.6 F (37 C)   Resp 18   Ht 5\' 3"  (1.6 m)   Wt (!) 186.9 kg (412 lb)   LMP 06/29/2017   SpO2 100%   BMI 72.98 kg/m  CONSTITUTIONAL: Alert and oriented and responds appropriately to questions. Well-appearing; well-nourished HEAD: Normocephalic EYES:  Conjunctivae clear, pupils appear equal, EOMI ENT: normal nose; moist mucous membranes; No pharyngeal erythema or petechiae, mild bilateral tonsillar hypertrophy without significant exudate, no uvular deviation, no unilateral swelling, no trismus or drooling, no muffled voice, normal phonation, no stridor, no dental caries present, no drainable dental abscess noted, no Ludwig's angina, tongue sits flat in the bottom of the mouth, no angioedema, no facial erythema or warmth, no facial swelling; no pain with movement of the neck. NECK: Supple, no meningismus, no nuchal rigidity, no LAD  CARD: RRR; S1 and S2 appreciated; no murmurs, no clicks, no rubs, no gallops RESP: Normal chest excursion without splinting  or tachypnea; breath sounds clear and equal bilaterally; no wheezes, no rhonchi, no rales, no hypoxia or respiratory distress, speaking full sentences ABD/GI: Normal bowel sounds; non-distended; soft, non-tender, no rebound, no guarding, no peritoneal signs, no hepatosplenomegaly BACK:  The back appears normal and is non-tender to palpation, there is no CVA tenderness EXT: Normal ROM in all joints; non-tender to palpation; no edema; normal capillary refill; no cyanosis, no calf tenderness or swelling    SKIN: Normal color for age and race; warm; no rash NEURO: Moves all extremities equally PSYCH: The patient's mood and manner are appropriate. Grooming and personal hygiene are appropriate.  MEDICAL DECISION MAKING: Patient here with complaints of cough, sore throat, vomiting. Suspect viral illness. She is afebrile, nontoxic with benign abdominal exam. Has been drinking [facility] throughout her entire visit in the emergency department. Labs obtained in triage are unremarkable. Urine does show blood but she is on her menstrual cycle and has no urinary symptoms. Chest x-ray clear. We'll send strep swab given she does have some tonsillar hypertrophy. There is no uvular deviation, trismus, drooling, stridor, changes in phonation. We'll treat symptomatically with ibuprofen and Zofran.  ED PROGRESS: Patient strep test is negative. Her chest x-ray shows no pneumonia. No sign of any deep space neck infection, meningitis, peritonsillar abscess. She does not appear septic. I feel she is safe to be discharged. Rectal and alternating Tylenol and Motrin. She has been able to drink here without any vomiting. We'll discharge with prescription for Phenergan for nausea at home.  At this time, I do not feel there is any life-threatening condition present. I have reviewed and discussed all results (EKG, imaging, lab, urine as appropriate) and exam findings with patient/family. I have reviewed nursing notes and appropriate  previous records.  I feel the patient is safe to be discharged home without further emergent workup and can continue workup as an outpatient as needed. Discussed usual and customary return precautions. Patient/family verbalize understanding and are comfortable with this plan.  Outpatient follow-up has been provided if needed. All questions have been answered.      EKG Interpretation  Date/Time:  Tuesday July 01 2017 18:23:19 EDT Ventricular Rate:  99 PR Interval:  142 QRS Duration: 76 QT Interval:  326 QTC Calculation: 418 R Axis:   66 Text Interpretation:  Normal sinus rhythm Normal ECG Confirmed by Rochele Raring [phone]) on 07/01/2017 11:16:57 PM         Jazmene Racz, Layla Maw, DO 07/02/17 6045

## 2017-07-01 NOTE — ED Triage Notes (Signed)
Pt states she has been vomiting and "can't breathe" since this morning. Pt talking in complete sentences. No distress noted.

## 2017-07-01 NOTE — ED Notes (Signed)
Pt called for in waiting area for vital sign reassessment. No answer x3. 

## 2017-07-02 LAB — I-STAT BETA HCG BLOOD, ED (MC, WL, AP ONLY): I-stat hCG, quantitative: <5 m[IU]/mL (ref <5)

## 2017-07-02 LAB — PREGNANCY, URINE: PREG TEST UR: NEGATIVE

## 2017-07-02 LAB — POC URINE PREG, ED: Preg Test, Ur: NEGATIVE

## 2017-07-02 MED ORDER — PROMETHAZINE HCL 25 MG PO TABS: 25.0000 mg | ORAL_TABLET | Freq: Four times a day (QID) | ORAL | 0 refills | Status: DC | PRN

## 2017-07-02 NOTE — Discharge Instructions (Signed)
You may alternate Tylenol 1000 mg every 6 hours as needed for pain and Ibuprofen 800 mg every 8 hours as needed for pain.  Please take Ibuprofen with food.  You may use over-the-counter Mucinex for cough. You may use over-the-counter Chloraseptic spray and lozenges for sore throat.  Your strep test was negative. Your chest x-ray did not show any pneumonia.

## 2017-07-02 NOTE — ED Notes (Signed)
Patient Alert and oriented X4. Stable and ambulatory. Patient verbalized understanding of the discharge instructions.  Patient belongings were taken by the patient.  

## 2017-07-04 LAB — CULTURE, GROUP A STREP (THRC)

## 2017-07-05 ENCOUNTER — Telehealth: Payer: Self-pay

## 2017-07-05 NOTE — Progress Notes (Signed)
ED Antimicrobial Stewardship Positive Culture Follow Up   [name] is an 26 y.o. female [facility] with a chief complaint of  Chief Complaint  Patient presents with  . Shortness of Breath  . Emesis    Recent Results (from the past 720 hour(s))  Rapid strep screen     Status: None   Collection Time: 07/01/17 11:30 PM  Result Value Ref Range Status   Streptococcus, Group A Screen (Direct) NEGATIVE NEGATIVE Final    Comment: (NOTE) A Rapid Antigen test may result negative if the antigen level in the sample is below the detection level of this test. The FDA has not cleared this test as a stand-alone test therefore the rapid antigen negative result has reflexed to a Group A Strep culture.   Culture, group A strep     Status: None   Collection Time: 07/01/17 11:30 PM  Result Value Ref Range Status   Specimen Description THROAT  Final   Special Requests NONE Reflexed from [id number]  Final   Culture MODERATE NON-GROUPABLE BETA STREPTOCOCCUS  Final   Report Status 07/04/2017 FINAL  Final   Rapid strep neg, cx +  New antibiotic prescription: amox 500 bid x 10 d  ED Provider: Lenore Cordiabowie tran, pa-c  [name] 07/05/2017, 10:19 AM Infectious Diseases Pharmacist Phone# [phone]

## 2017-07-05 NOTE — Telephone Encounter (Signed)
Post ED Visit - Positive Culture Follow-up: Unsuccessful Patient Follow-up  Culture assessed and recommendations reviewed by:  []  Enzo BiNathan Batchelder, Pharm.D. []  Celedonio MiyamotoJeremy Frens, Pharm.D., BCPS AQ-ID [x]  Garvin FilaMike Maccia, Pharm.D., BCPS []  Georgina PillionElizabeth Martin, [address]Ph[address]D., BCPS []  BatesvilleMinh Pham, [address]Pharm[address]D., BCPS, AAHIVP []  Estella HuskMichelle Turner, Pharm.D., BCPS, AAHIVP []  Lysle Pearlachel Rumbarger, PharmD, BCPS []  Casilda Carlsaylor Stone, PharmD, BCPS []  Pollyann SamplesAndy Johnston, PharmD, BCPS  Positive strep culture  [x]  Patient discharged without antimicrobial prescription and treatment is now indicated []  Organism is resistant to prescribed ED discharge antimicrobial []  Patient with positive blood cultures   Unable to contact patient after 3 attempts, letter will be sent to address on file  Jerry CarasCullom, Rashmi Tallent Burnett 07/05/2017, 11:11 AM

## 2017-08-22 ENCOUNTER — Ambulatory Visit: Payer: Self-pay

## 2017-08-26 ENCOUNTER — Ambulatory Visit: Payer: Self-pay

## 2017-09-02 ENCOUNTER — Ambulatory Visit: Payer: Self-pay | Admitting: Internal Medicine

## 2017-10-05 ENCOUNTER — Emergency Department (HOSPITAL_COMMUNITY)
Admission: EM | Admit: 2017-10-05 | Discharge: 2017-10-06 | Disposition: A | Discharge status: Home / Self Care | Payer: Self-pay | Attending: Emergency Medicine | Admitting: Emergency Medicine

## 2017-10-05 ENCOUNTER — Encounter (HOSPITAL_COMMUNITY): Payer: Self-pay | Admitting: Emergency Medicine

## 2017-10-05 DIAGNOSIS — K0889 Other specified disorders of teeth and supporting structures: Secondary | ICD-10-CM | POA: Insufficient documentation

## 2017-10-05 DIAGNOSIS — F17210 Nicotine dependence, cigarettes, uncomplicated: Secondary | ICD-10-CM | POA: Insufficient documentation

## 2017-10-05 DIAGNOSIS — Z79899 Other long term (current) drug therapy: Secondary | ICD-10-CM | POA: Insufficient documentation

## 2017-10-05 DIAGNOSIS — Z7984 Long term (current) use of oral hypoglycemic drugs: Secondary | ICD-10-CM | POA: Insufficient documentation

## 2017-10-05 DIAGNOSIS — F909 Attention-deficit hyperactivity disorder, unspecified type: Secondary | ICD-10-CM | POA: Insufficient documentation

## 2017-10-05 DIAGNOSIS — J45909 Unspecified asthma, uncomplicated: Secondary | ICD-10-CM | POA: Insufficient documentation

## 2017-10-05 HISTORY — DX: Major depressive disorder, single episode, unspecified: F32.9

## 2017-10-05 HISTORY — DX: Depression, unspecified: F32.A

## 2017-10-05 NOTE — ED Triage Notes (Signed)
Patient reports left upper dental pain onset this evening .  

## 2017-10-06 MED ORDER — IBUPROFEN 800 MG PO TABS: 800.0000 mg | ORAL_TABLET | Freq: Three times a day (TID) | ORAL | 0 refills | Status: DC

## 2017-10-06 NOTE — Discharge Instructions (Signed)
As discussed, follow up in the morning with a dentist from the list provided. Ibuprofen for pain. Return if facial swelling, difficulty breathing, unable to swallow or other new concerning symptoms in the meantime.

## 2017-10-06 NOTE — ED Notes (Signed)
Pt departed in NAD, refused use of wheelchair.  

## 2017-10-06 NOTE — ED Provider Notes (Signed)
[facility] EMERGENCY DEPARTMENT Provider Note   CSN: [id number] Arrival date & time: 10/05/17  2150     History   Chief Complaint Chief Complaint  Patient presents with  . Dental Pain    Cracked Tooth    HPI [name] is a 26 y.o. female presenting with sudden onset left upper dental pain after chipping her tooth pta. She reports that her pain has currently resolved and has been intermittent since onset. No difficulty swallowing, fever, chills, facial swelling.  HPI  Past Medical History:  Diagnosis Date  . Acid reflux   . ADHD   . Asthma   . Clotting disorder (HCC)   . Depression   . 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 Exogenous obesity   . Narcolepsy cataplexy syndrome 2007   Prior sleep studies starting on September 26 2005, AHI 5.3 per hour,  . Poor sleep hygiene     Patient Active Problem List   Diagnosis Date Noted  . Migraine 06/02/2017  . Intercostal pain 08/25/2016  . Knee pain, bilateral 07/14/2016  . Venous stasis dermatitis of both lower extremities 05/26/2016  . Stye external 03/25/2016  . Plantar fasciitis, right 03/25/2016  . Leg swelling 11/17/2015  . Headache 09/08/2015  . Pain of molar 05/26/2015  . Sebaceous cyst of left axilla 04/13/2015  . Vitamin D deficiency 03/29/2015  . Allergic rhinitis 04/11/2010  . Iron deficiency anemia 03/09/2010  . Esophageal reflux 03/24/2009  . MENORRHAGIA 03/17/2009  . Morbid obesity with body mass index of 70 and over in adult [facility]) 05/15/2007  . Depression 02/12/2007  . 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 none      OB History    No data available       Home Medications    Prior to Admission medications   Medication Sig Start Date End Date  Taking? Authorizing Provider  acetaminophen (TYLENOL) 500 MG tablet Take 1,000 mg by mouth every 6 (six) hours as needed for mild pain.    [provider]  buPROPion (WELLBUTRIN XL) 300 MG 24 hr tablet Take 1 tablet (300 mg total) by mouth daily. 02/13/17   Funches, Gerilyn Nestle, MD  cetirizine (ZYRTEC) 10 MG tablet Take 1 tablet (10 mg total) by mouth daily. 02/13/17   Funches, Gerilyn Nestle, MD  ferrous sulfate 325 (65 FE) MG tablet Take 1 tablet (325 mg total) by mouth 3 (three) times daily with meals. 02/13/17   Funches, Gerilyn Nestle, MD  furosemide (LASIX) 20 MG tablet Take 1 tablet (20 mg total) by mouth daily. 06/02/17   Jaclyn Shaggy, MD  ibuprofen (ADVIL,MOTRIN) 800 MG tablet Take 1 tablet (800 mg total) by mouth 3 (three) times daily. 10/06/17   Georgiana Shore, PA-C  medroxyPROGESTERone (DEPO-PROVERA) 150 MG/ML injection Inject 150 mg into the muscle every 3 (three) months.    [provider]  metFORMIN (GLUCOPHAGE XR) 500 MG 24 hr tablet Take 1 tablet (500 mg total) by mouth daily after supper. 01/10/17   Funches, Gerilyn Nestle, MD  naproxen (NAPROSYN) 250 MG tablet Take 250 mg by mouth daily as needed for mild pain.    [provider]  omeprazole (PRILOSEC) 20 MG capsule Take 2 capsules (40 mg total) by mouth daily before supper. 06/02/17   Jaclyn Shaggy, MD  promethazine (PHENERGAN) 25 MG tablet Take 1 tablet (25 mg total) by mouth every 6 (six) hours as needed for nausea or vomiting. 07/02/17   Ward, Layla MawKristen N, DO    Family History Family History  Problem Relation Age of Onset  . Hypertension Mother   . Asthma Mother   . Alcohol abuse Father   . Asthma Brother   . Alcohol abuse Maternal Grandmother     Social History Social History  Substance Use Topics  . Smoking status: Current Some Day Smoker    Types: Cigarettes  . Smokeless tobacco: Never Used  . Alcohol use Yes     Comment: ocassional     Allergies   Banana and Food   Review of Systems Review of Systems    Constitutional: Negative for chills and fever.  HENT: Negative for congestion, drooling, ear pain, facial swelling, sinus pressure, sore throat, tinnitus, trouble swallowing and voice change.   Respiratory: Negative for cough, chest tightness, shortness of breath, wheezing and stridor.   Cardiovascular: Negative for chest pain and leg swelling.  Gastrointestinal: Negative for abdominal pain, nausea and vomiting.  Genitourinary: Negative for dysuria, flank pain and hematuria.  Musculoskeletal: Negative for neck pain and neck stiffness.  Skin: Negative for color change, pallor and rash.  Neurological: Negative for dizziness, facial asymmetry, speech difficulty, weakness, light-headedness and headaches.  Psychiatric/Behavioral: Negative for behavioral problems.     Physical Exam Updated Vital Signs BP 130/60 (BP Location: Right Arm)   Pulse 79   Temp 98.2 F (36.8 C) (Oral)   Resp 20   Ht 5\' 3"  (1.6 m)   Wt (!) 191.7 kg (422 lb 9.6 oz)   LMP 07/16/2017   SpO2 100%   BMI 74.86 kg/m   Physical Exam  Constitutional: She appears well-developed and well-nourished. No distress.  Patient is afebrile, non-toxic appearing, sitting comfortably in chair in no acute distress.  HENT:  Head: Normocephalic and atraumatic.  Mouth/Throat: Uvula is midline, oropharynx is clear and moist and mucous membranes are normal. No trismus in the jaw. Abnormal dentition. Dental caries present. No dental abscesses or uvula swelling. No oropharyngeal exudate, posterior oropharyngeal edema, posterior oropharyngeal erythema or tonsillar abscesses. No tonsillar exudate.    No gross oral abscess. Uvula is midline, arches are simmetrical and intact. No peritonsilar swelling or exudate. No trismus. Sublingual mucosa is soft and non-tender. Tolerating oral secretions. No concern for ludwig's angina.  Eyes: Conjunctivae and EOM are normal.  Neck: Normal range of motion. Neck supple.  Cardiovascular: Normal rate,  regular rhythm and normal heart sounds.   No murmur heard. Pulmonary/Chest: Effort normal and breath sounds normal. No respiratory distress. She has no wheezes. She has no rales.  Musculoskeletal: She exhibits no edema.  Lymphadenopathy:    She has no cervical adenopathy.  Neurological: She is alert.  Skin: Skin is warm and dry. No rash noted. She is not diaphoretic. No erythema. No pallor.  Psychiatric: She has a normal mood and affect.  Nursing note and vitals reviewed.    ED Treatments / Results  Labs (all labs ordered are listed, but only abnormal results are displayed) Labs Reviewed - No data to display  EKG  EKG Interpretation None       Radiology No results found.  Procedures Dental Block Date/Time: 10/06/2017 12:28 AM Performed by: Mathews RobinsonsMITCHELL, JESSICA B Authorized by: Mathews RobinsonsMITCHELL, JESSICA B   Consent:    Consent obtained:  Verbal   Consent given by:  Patient   Risks discussed:  Unsuccessful block, nerve damage and pain   Alternatives discussed:  No treatment Indications:    Indications: dental pain   Location:    Block type:  Posterior superior alveolar   Laterality:  Left Procedure details (see MAR for exact dosages):    Topical anesthetic:  Benzocaine gel   Syringe type:  Aspirating dental syringe   Needle gauge:  27 G   Anesthetic injected:  Bupivacaine 0.5% w/o epi Post-procedure details:    Outcome:  Pain relieved   Patient tolerance of procedure:  Tolerated well, no immediate complications    (including critical care time)  Applied dentin to secure broken fragment in place  Medications Ordered in ED Medications - No data to display   Initial Impression / Assessment and Plan / ED Course  I have reviewed the triage vital signs and the nursing notes.  Pertinent labs & imaging results that were available during my care of the patient were reviewed by me and considered in my medical decision making (see chart for details).     Patient with  toothache.  No gross abscess.  Exam unconcerning for Ludwig's angina or spread of infection.  Urged patient to follow-up with dentist.    Posterior alveolar block relieved pain and dentin applied to secure small tooth fragment in place.  Discharge with symptomatic relief and dentist follow up and provided resources.  Discussed strict return precautions and advised to return to the emergency department if experiencing any new or worsening symptoms. Instructions were understood and patient agreed with discharge plan. Final Clinical Impressions(s) / ED Diagnoses   Final diagnoses:  Pain, dental    New Prescriptions New Prescriptions   IBUPROFEN (ADVIL,MOTRIN) 800 MG TABLET    Take 1 tablet (800 mg total) by mouth 3 (three) times daily.     Georgiana Shore, PA-C 10/06/17 0139    Melene Plan, DO 10/07/17 2350

## 2017-11-10 ENCOUNTER — Telehealth: Payer: Self-pay | Admitting: Emergency Medicine

## 2017-11-10 NOTE — Telephone Encounter (Signed)
Lost to followup 

## 2017-11-13 ENCOUNTER — Ambulatory Visit: Payer: Self-pay

## 2017-11-14 ENCOUNTER — Encounter (HOSPITAL_COMMUNITY): Payer: Self-pay | Admitting: Emergency Medicine

## 2017-11-14 ENCOUNTER — Emergency Department (HOSPITAL_COMMUNITY)
Admission: EM | Admit: 2017-11-14 | Discharge: 2017-11-15 | Disposition: A | Discharge status: Home / Self Care | Payer: Self-pay | Attending: Physician Assistant | Admitting: Physician Assistant

## 2017-11-14 DIAGNOSIS — R21 Rash and other nonspecific skin eruption: Secondary | ICD-10-CM | POA: Insufficient documentation

## 2017-11-14 DIAGNOSIS — J45909 Unspecified asthma, uncomplicated: Secondary | ICD-10-CM | POA: Insufficient documentation

## 2017-11-14 DIAGNOSIS — M25551 Pain in right hip: Secondary | ICD-10-CM | POA: Insufficient documentation

## 2017-11-14 DIAGNOSIS — R234 Changes in skin texture: Secondary | ICD-10-CM | POA: Insufficient documentation

## 2017-11-14 DIAGNOSIS — F17210 Nicotine dependence, cigarettes, uncomplicated: Secondary | ICD-10-CM | POA: Insufficient documentation

## 2017-11-14 DIAGNOSIS — Z7984 Long term (current) use of oral hypoglycemic drugs: Secondary | ICD-10-CM | POA: Insufficient documentation

## 2017-11-14 DIAGNOSIS — Z23 Encounter for immunization: Secondary | ICD-10-CM | POA: Insufficient documentation

## 2017-11-14 DIAGNOSIS — M79672 Pain in left foot: Secondary | ICD-10-CM | POA: Insufficient documentation

## 2017-11-14 DIAGNOSIS — Z79899 Other long term (current) drug therapy: Secondary | ICD-10-CM | POA: Insufficient documentation

## 2017-11-14 NOTE — ED Triage Notes (Signed)
Reports having right hip pain that is chronic and worse for 2 weeks.  Also reports an open sore to bottom of left foot.  Reports noticing it 2-3 days ago.

## 2017-11-15 LAB — I-STAT CHEM 8, ED
BUN: 10 mg/dL (ref 6–20)
CALCIUM ION: 1.16 mmol/L (ref 1.15–1.40)
CHLORIDE: 107 mmol/L (ref 101–111)
Creatinine, Ser: 0.4 mg/dL — ABNORMAL LOW (ref 0.44–1.00)
Glucose, Bld: 95 mg/dL (ref 65–99)
HCT: 37 % (ref 36.0–46.0)
Hemoglobin: 12.6 g/dL (ref 12.0–15.0)
POTASSIUM: 4.2 mmol/L (ref 3.5–5.1)
SODIUM: 141 mmol/L (ref 135–145)
TCO2: 23 mmol/L (ref 22–32)

## 2017-11-15 MED ORDER — PREDNISONE 20 MG PO TABS: 20.0000 mg | ORAL_TABLET | Freq: Two times a day (BID) | ORAL | 0 refills | Status: AC

## 2017-11-15 MED ORDER — TETANUS-DIPHTH-ACELL PERTUSSIS 5-2.5-18.5 LF-MCG/0.5 IM SUSP
0.5000 mL | Freq: Once | INTRAMUSCULAR | Status: AC
  Administered 2017-11-15: 0.5 mL via INTRAMUSCULAR
  Filled 2017-11-15: qty 0.5

## 2017-11-15 MED ORDER — IBUPROFEN 400 MG PO TABS
600.0000 mg | ORAL_TABLET | Freq: Once | ORAL | Status: AC
  Administered 2017-11-15: 600 mg via ORAL
  Filled 2017-11-15: qty 1

## 2017-11-15 MED ORDER — NYSTATIN 100000 UNIT/GM EX CREA: TOPICAL_CREAM | CUTANEOUS | 0 refills | Status: DC

## 2017-11-15 MED ORDER — IBUPROFEN 600 MG PO TABS: 600.0000 mg | ORAL_TABLET | Freq: Four times a day (QID) | ORAL | 0 refills | Status: DC | PRN

## 2017-11-15 NOTE — Discharge Instructions (Signed)
Please see the information and instructions below regarding your visit.  Your diagnoses today include:  1. Right hip pain   2. Left foot pain    About diagnosis. Most episodes of acute low back/hip pain are self-limited. Your exam was reassuring today that the source of your pain is not affecting the spinal cord and nerves that originate in the spinal cord.   If you have a history of disc herniation or arthritis in your spine, the nerves exiting the spine on one side get inflamed. This can cause severe pain. We call this radiculopathy. We do not always know what causes the sudden inflammation.  Examination of your feet is reassuring.  I believe this is due to dry skin.  Please make sure that you are keeping good lotion on them such as Gold [employer] therapy.  I provided a foot specialist free to follow-up with if needed.  Tests performed today include: See side panel of your discharge paperwork for testing performed today. Vital signs are listed at the bottom of these instructions.   Your hemoglobin, the oxygen carrying capacity in your blood was good today.  Medications prescribed:    Take any prescribed medications only as prescribed, and any over the counter medications only as directed on the packaging.  You are prescribed prednisone, a steroid in the ED. This is a medication to help reduce inflammation in the sciatic nerve, which comes out from the spine.  Common side effects include upset stomach/nausea. You may take this medicine with food if this occurs. Other side effects include restlessness, difficulty sleeping, and increased sweating. Call your healthcare provider if these do not resolve after finishing the medication.  This medicine may increase your blood sugar so additional careful monitoring is needed of blood sugar if you have diabetes. Call your healthcare provider for any signs/symtpoms of high blood sugar such as confusion, feeling sleepy, more thirst, more hunger, passing  urine more often, flushing, fast breathing, or breath that smells like fruit.  You were also prescribed nystatin cream he may put this on skin that is irritated on your legs.  Home care instructions:   Low back pain gets worse the longer you stay stationary. Please keep moving and walking as tolerated. There are exercises included in this packet to perform as tolerated for your low back pain.   Apply heat to the areas that are painful. Avoid twisting or bending your trunk to lift something. Do not lift anything above 25 lbs while recovering from this flare of low back pain.  Please follow any educational materials contained in this packet.   Follow-up instructions: Please follow-up with your primary care provider as soon as possible for further evaluation of your symptoms if they are not completely improved.   Return instructions:  Please return to the Emergency Department if you experience worsening symptoms.  Please return for any fever or chills in the setting of your back pain, weakness in the muscles of the legs, numbness in your legs and feet that is new or changing, numbness in the area where you wipe, retention of your urine, loss of bowel or bladder control, or problems with walking. Please return for any redness or drainage out of your feet. Please return if you have any other emergent concerns.  Additional Information:   Your vital signs today were: BP 120/82    Pulse 92    Temp 97.9 F (36.6 C) (Oral)    Resp 14    Ht 5\' 3"  (1.6  m)    Wt (!) 186.9 kg (412 lb)    LMP 08/05/2017    SpO2 100%    BMI 72.98 kg/m  If your blood pressure (BP) was elevated on multiple readings during this visit above 130 for the top number or above 80 for the bottom number, please have this repeated by your primary care provider within one month. --------------  Thank you for allowing us to participate in your care today.

## 2017-11-15 NOTE — ED Provider Notes (Signed)
[facility] EMERGENCY DEPARTMENT Provider Note   CSN: [id number] Arrival date & time: 11/14/17  1910     History   Chief Complaint Chief Complaint  Patient presents with  . Hip Pain    right  . Foot Pain    left    HPI [name] is a 26 y.o. female.  HPI  Patient is a 26 year old female with a history of ADHD, asthma, sleep apnea, and morbid obesity presenting with multiple complaints.  Patient reports that she has had pain to her left heel due to dry skin and is caused a sore.  Additionally, patient is concerned about decreased sensation in her left little toe.  Patient reports that she does not use lotions on her left foot to mediate dry skin.  Patient also notes that she has some right hip pain that she has noticed for the past 2 weeks.  Patient has a history of low back pain, but the hip pain is newer.  Patient reports that she was moving apartments and lifting heavy items prior to the onset of the symptoms.  Patient reports that the pain will radiate from the low back down her right buttock and around the hip.  No loss of sensation in the right lower extremity.  No muscular weakness.  No loss of bowel or bladder control.  No saddle anesthesia.  No fever or chills.  No IV drug use.  Patient is not diabetic or otherwise immunocompromised.  Patient also notes that she has some erythema developing bilaterally in her legs in intertriginous areas on the legs.   Past Medical History:  Diagnosis Date  . Acid reflux   . ADHD   . Asthma   . Clotting disorder (HCC)   . Depression   . 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 Exogenous obesity   . Narcolepsy cataplexy syndrome 2007   Prior sleep studies starting on September 26 2005, AHI 5.3 per hour,  . Poor sleep hygiene     Patient Active Problem List   Diagnosis Date Noted  . Migraine 06/02/2017  . Intercostal  pain 08/25/2016  . Knee pain, bilateral 07/14/2016  . Venous stasis dermatitis of both lower extremities 05/26/2016  . Stye external 03/25/2016  . Plantar fasciitis, right 03/25/2016  . Leg swelling 11/17/2015  . Headache 09/08/2015  . Pain of molar 05/26/2015  . Sebaceous cyst of left axilla 04/13/2015  . Vitamin D deficiency 03/29/2015  . Allergic rhinitis 04/11/2010  . Iron deficiency anemia 03/09/2010  . Esophageal reflux 03/24/2009  . MENORRHAGIA 03/17/2009  . Morbid obesity with body mass index of 70 and over in adult [facility]) 05/15/2007  . Depression 02/12/2007  . 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 none      OB History    No data available       Home Medications    Prior to Admission medications   Medication Sig Start Date End Date Taking? Authorizing Provider  acetaminophen (TYLENOL) 500 MG tablet Take 1,000 mg by mouth every 6 (six) hours as needed for mild pain.    [provider]  buPROPion (WELLBUTRIN XL) 300 MG 24 hr tablet Take 1 tablet (300 mg total) by mouth daily. 02/13/17   Dessa Phi, MD  cetirizine (ZYRTEC) 10 MG tablet  Take 1 tablet (10 mg total) by mouth daily. 02/13/17   Funches, Gerilyn NestleJosalyn, MD  ferrous sulfate 325 (65 FE) MG tablet Take 1 tablet (325 mg total) by mouth 3 (three) times daily with meals. 02/13/17   Funches, Gerilyn NestleJosalyn, MD  furosemide (LASIX) 20 MG tablet Take 1 tablet (20 mg total) by mouth daily. 06/02/17   Jaclyn ShaggyAmao, Enobong, MD  ibuprofen (ADVIL,MOTRIN) 600 MG tablet Take 1 tablet (600 mg total) by mouth every 6 (six) hours as needed. 11/15/17   Aviva KluverMurray, Afiya Ferrebee B, PA-C  medroxyPROGESTERone (DEPO-PROVERA) 150 MG/ML injection Inject 150 mg into the muscle every 3 (three) months.    [provider]  metFORMIN (GLUCOPHAGE XR) 500 MG 24 hr tablet Take 1 tablet (500 mg total) by mouth daily after supper.  01/10/17   Funches, Gerilyn NestleJosalyn, MD  naproxen (NAPROSYN) 250 MG tablet Take 250 mg by mouth daily as needed for mild pain.    [provider]  nystatin cream (MYCOSTATIN) Apply to affected area 2 times daily 11/15/17   Aviva KluverMurray, Lewie Deman B, PA-C  omeprazole (PRILOSEC) 20 MG capsule Take 2 capsules (40 mg total) by mouth daily before supper. 06/02/17   Jaclyn ShaggyAmao, Enobong, MD  predniSONE (DELTASONE) 20 MG tablet Take 1 tablet (20 mg total) by mouth 2 (two) times daily with a meal for 5 days. 11/15/17 11/20/17  Aviva KluverMurray, Lilo Wallington B, PA-C  promethazine (PHENERGAN) 25 MG tablet Take 1 tablet (25 mg total) by mouth every 6 (six) hours as needed for nausea or vomiting. 07/02/17   Ward, Layla MawKristen N, DO    Family History Family History  Problem Relation Age of Onset  . Hypertension Mother   . Asthma Mother   . Alcohol abuse Father   . Asthma Brother   . Alcohol abuse Maternal Grandmother     Social History Social History   Tobacco Use  . Smoking status: Current Some Day Smoker    Types: Cigarettes  . Smokeless tobacco: Never Used  Substance Use Topics  . Alcohol use: Yes    Comment: ocassional  . Drug use: Yes    Types: Marijuana     Allergies   Banana and Food   Review of Systems Review of Systems  Constitutional: Negative for chills and fever.  Gastrointestinal: Negative for abdominal pain, nausea and vomiting.  Musculoskeletal: Positive for arthralgias and back pain. Negative for gait problem and myalgias.  Skin: Positive for rash. Negative for wound.  Neurological: Negative for weakness and numbness.     Physical Exam Updated Vital Signs BP 133/86 (BP Location: Right Arm)   Pulse 92   Temp 97.9 F (36.6 C) (Oral)   Resp 18   Ht 5\' 3"  (1.6 m)   Wt (!) 186.9 kg (412 lb)   LMP 08/05/2017   SpO2 100%   BMI 72.98 kg/m   Physical Exam  Constitutional: She appears well-developed and well-nourished. No distress.  Sitting comfortably in bed.  HENT:  Head: Normocephalic and  atraumatic.  Eyes: Conjunctivae are normal. Right eye exhibits no discharge. Left eye exhibits no discharge.  EOMs normal to gross examination.  Neck: Normal range of motion.  Cardiovascular: Normal rate and regular rhythm.  Intact, 2+ DP and PT pulses bilaterally.  Pulmonary/Chest:  Normal respiratory effort. Patient converses comfortably. No audible wheeze or stridor.  Abdominal: She exhibits no distension.  Musculoskeletal: Normal range of motion.  Spine Exam: Inspection/Palpation: No midline tenderness to palpation of thoracic or lumbar spine.  Right paraspinal muscular lumbar tenderness.  Tenderness extends  to sacroiliac joint and around to right lateral hip. Strength: 5/5 throughout LE bilaterally (hip flexion/extension, adduction/abduction; knee flexion/extension; foot dorsiflexion/plantarflexion, inversion/eversion; great toe inversion) Sensation: Intact to light touch in proximal and distal LE bilaterally Reflexes: 2+ quadriceps and achilles reflexes Patient ambulates symmetrically with good coordination.  No evidence of foot drop.  Neurological: She is alert.  Cranial nerves intact to gross observation. Patient moves extremities without difficulty. Sensation intact to sharp touch to the distal tip of the left fifth toe.   Skin: Skin is warm and dry. She is not diaphoretic.  Bilateral heels exhibit extensive xerosis.  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  Patient exhibits erythema with macules present on bilateral intertriginous areas of the popliteal region.  Psychiatric: She has a normal mood and affect. Her behavior is normal. Judgment and thought content normal.  Nursing note and vitals reviewed.    ED Treatments / Results  Labs (all labs ordered are listed, but only abnormal results are displayed) Labs Reviewed  I-STAT CHEM 8, ED - Abnormal; Notable for the following components:      Result Value   Creatinine, Ser 0.40 (*)    All other  components within normal limits    EKG  EKG Interpretation None       Radiology No results found.  Procedures Procedures (including critical care time)  Medications Ordered in ED Medications  ibuprofen (ADVIL,MOTRIN) tablet 600 mg (600 mg Oral Given 11/15/17 0132)  Tdap (BOOSTRIX) injection 0.5 mL (0.5 mLs Intramuscular Given 11/15/17 0133)     Initial Impression / Assessment and Plan / ED Course  I have reviewed the triage vital signs and the nursing notes.  Pertinent labs & imaging results that were available during my care of the patient were reviewed by me and considered in my medical decision making (see chart for details).     Final Clinical Impressions(s) / ED Diagnoses   Final diagnoses:  Right hip pain  Left foot pain   Patient is well-appearing in no acute distress.  Patient presents with multiple complaints today.  I feel that given the recent physical activity, the patient is most likely experiencing radicular pain in the hip as it is posterior and patient has a history of low back pain.  Doubt septic arthritis is patient has been ambulatory on the right hip with minimal complaints, patient has not had any other systemic symptoms.  Patient is not a diabetic.  5 days of prednisone therapy prescribed.  Recommended Salon PAS patches for lumbar and hip pain.    We placed a wet-to-dry dressing over the skin cracking in the left foot today.  I recommended patient use copious lotions to heal this.  I provided podiatric follow-up referral.  I feel that patient's report of numbness in the left pinky toe does not clinically correlate with any concerning neurologic features.  This is likely due to compression of a nerve.  Patient also has a history of plantar fasciitis in that foot.  Patient has a history of anemia and was concerned about circulation issues.  H&H checked today and normal.  Recommended PCP follow-up.    The erythema in bilateral intertriginous areas of the  popliteal region is suggestive of yeast infection due to moisture.  Nystatin cream for these areas.  Return precautions given for any weakness, lower extremity numbness, saddle anesthesia, loss of bowel or bladder control, worsening erythema or drainage of the skin.  Tetanus updated today.  Patient is  understanding and agrees with the plan of care.  ED Discharge Orders        Ordered    predniSONE (DELTASONE) 20 MG tablet  2 times daily with meals     11/15/17 0236    nystatin cream (MYCOSTATIN)     11/15/17 0236    ibuprofen (ADVIL,MOTRIN) 600 MG tablet  Every 6 hours PRN     11/15/17 0236       Elisha PonderMurray, Kyler Lerette B, PA-C 11/15/17 0401    Abelino DerrickMackuen, Courteney Lyn, MD 11/15/17 [phone]

## 2017-11-15 NOTE — ED Notes (Signed)
Patient able to ambulate independently  

## 2017-11-15 NOTE — ED Notes (Signed)
Pt appears to have been discharged from the department at 0246 but was not removed from the system. Pt not present in dept at this time.

## 2017-11-19 ENCOUNTER — Ambulatory Visit: Payer: Self-pay | Attending: Family Medicine | Admitting: Physician Assistant

## 2017-11-19 ENCOUNTER — Encounter: Payer: Self-pay | Admitting: Physician Assistant

## 2017-11-19 VITALS — BP 134/88 | HR 78 | Temp 98.2°F | Resp 20 | Ht 63.0 in | Wt >= 6400 oz

## 2017-11-19 DIAGNOSIS — Z91018 Allergy to other foods: Secondary | ICD-10-CM | POA: Insufficient documentation

## 2017-11-19 DIAGNOSIS — G47411 Narcolepsy with cataplexy: Secondary | ICD-10-CM | POA: Insufficient documentation

## 2017-11-19 DIAGNOSIS — M25551 Pain in right hip: Secondary | ICD-10-CM | POA: Insufficient documentation

## 2017-11-19 DIAGNOSIS — K219 Gastro-esophageal reflux disease without esophagitis: Secondary | ICD-10-CM | POA: Insufficient documentation

## 2017-11-19 DIAGNOSIS — Z7984 Long term (current) use of oral hypoglycemic drugs: Secondary | ICD-10-CM | POA: Insufficient documentation

## 2017-11-19 DIAGNOSIS — F329 Major depressive disorder, single episode, unspecified: Secondary | ICD-10-CM | POA: Insufficient documentation

## 2017-11-19 DIAGNOSIS — M25559 Pain in unspecified hip: Secondary | ICD-10-CM

## 2017-11-19 DIAGNOSIS — R400 Somnolence: Secondary | ICD-10-CM | POA: Insufficient documentation

## 2017-11-19 DIAGNOSIS — R202 Paresthesia of skin: Secondary | ICD-10-CM | POA: Insufficient documentation

## 2017-11-19 DIAGNOSIS — R739 Hyperglycemia, unspecified: Secondary | ICD-10-CM | POA: Insufficient documentation

## 2017-11-19 DIAGNOSIS — Z791 Long term (current) use of non-steroidal anti-inflammatories (NSAID): Secondary | ICD-10-CM | POA: Insufficient documentation

## 2017-11-19 DIAGNOSIS — Z79899 Other long term (current) drug therapy: Secondary | ICD-10-CM | POA: Insufficient documentation

## 2017-11-19 DIAGNOSIS — F909 Attention-deficit hyperactivity disorder, unspecified type: Secondary | ICD-10-CM | POA: Insufficient documentation

## 2017-11-19 DIAGNOSIS — J45909 Unspecified asthma, uncomplicated: Secondary | ICD-10-CM | POA: Insufficient documentation

## 2017-11-19 DIAGNOSIS — Z7952 Long term (current) use of systemic steroids: Secondary | ICD-10-CM | POA: Insufficient documentation

## 2017-11-19 LAB — GLUCOSE, POCT (MANUAL RESULT ENTRY): POC Glucose: 106 mg/dl — AB (ref 70–99)

## 2017-11-19 MED ORDER — IBUPROFEN 800 MG PO TABS: 800.0000 mg | ORAL_TABLET | Freq: Three times a day (TID) | ORAL | 0 refills | Status: DC | PRN

## 2017-11-19 MED FILL — IBUPROFEN 800 MG TABLET: 800 | 10 days supply | Qty: 30 | Fill #0

## 2017-11-19 MED FILL — NYSTATIN 100,000 UNIT/GM CR: 100000 | 12 days supply | Qty: 30 | Fill #0

## 2017-11-19 MED FILL — predniSONE 20 MG TABS: 20 | 5 days supply | Qty: 10 | Fill #0

## 2017-11-19 NOTE — Progress Notes (Signed)
Patient ID: Yates Decampharleitta A Siddique, female   DOB: 1991-03-17, 26 y.o.   MRN: [id number]       [name], is a 26 y.o. female  [id number]  [id number]  DOB - 1991-03-17  Subjective:  Chief Complaint and HPI: [name] is a 26 y.o. female here today After being seen in the ED 11/14/2017 for R hip pain and L foot issues.  She was prescribed 5 days of prednisone and ibuprofen  for hip pain and wet to dry dressings on L foot in addition to being given a Tdap.  Nyastatin was also prescribed for tinea in the popliteal region B.  She presents today for follow up.  Her hip pain is unchanged.  Her foot is much improved.  She has not gotten any of the medications filled that she got prescriptions for in the ED.  She has not made a f/up appt with the podiatrist.  She says blood sugars have been good.  Blood sugar was normal at the hospital. No f/c.  No worsening of symptoms.  She does c/o new onset paresthesias in B upper arms X 2 weeks.  No CP/SOB.  No neck pain or neck injury.  No weakness.  ED/Hospital notes reviewed.    ROS:   Constitutional:  No f/c, No night sweats, No unexplained weight loss. EENT:  No vision changes, No blurry vision, No hearing changes. No mouth, throat, or ear problems.  Respiratory: No cough, No SOB Cardiac: No CP, no palpitations GI:  No abd pain, No N/V/D. GU: No Urinary s/sx Musculoskeletal: + R hip pain Neuro: No headache, no dizziness, no motor weakness.  Skin: No rash Endocrine:  No polydipsia. No polyuria.  Psych: Denies SI/HI  No problems updated.  ALLERGIES: Allergies  Allergen Reactions  . Banana Itching  . Food Itching    All fruits    PAST MEDICAL HISTORY: Past Medical History:  Diagnosis Date  . Acid reflux   . ADHD   . Asthma   . Clotting disorder (HCC)   . Depression   . 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 Exogenous  obesity   . Narcolepsy cataplexy syndrome 2007   Prior sleep studies starting on September 26 2005, AHI 5.3 per hour,  . Poor sleep hygiene     MEDICATIONS AT HOME: Prior to Admission medications   Medication Sig Start Date End Date Taking? Authorizing Provider  buPROPion (WELLBUTRIN XL) 300 MG 24 hr tablet Take 1 tablet (300 mg total) by mouth daily. 02/13/17  Yes Funches, Josalyn, MD  cetirizine (ZYRTEC) 10 MG tablet Take 1 tablet (10 mg total) by mouth daily. 02/13/17  Yes Funches, Josalyn, MD  ferrous sulfate 325 (65 FE) MG tablet Take 1 tablet (325 mg total) by mouth 3 (three) times daily with meals. 02/13/17  Yes Funches, Josalyn, MD  naproxen (NAPROSYN) 250 MG tablet Take 250 mg by mouth daily as needed for mild pain.   Yes [provider]  omeprazole (PRILOSEC) 20 MG capsule Take 2 capsules (40 mg total) by mouth daily before supper. 06/02/17  Yes Jaclyn ShaggyAmao, Enobong, MD  acetaminophen (TYLENOL) 500 MG tablet Take 1,000 mg by mouth every 6 (six) hours as needed for mild pain.    [provider]  furosemide (LASIX) 20 MG tablet Take 1 tablet (20 mg total) by mouth daily. Patient not taking: Reported on 11/19/2017 06/02/17   Jaclyn ShaggyAmao, Enobong, MD  ibuprofen (ADVIL,MOTRIN) 800 MG tablet Take 1 tablet (800 mg total) by mouth every 8 (eight) hours as needed. Prn pain with food 11/19/17   Georgian CoMcClung, Meosha Castanon M, PA-C  medroxyPROGESTERone (DEPO-PROVERA) 150 MG/ML injection Inject 150 mg into the muscle every 3 (three) months.    [provider]  metFORMIN (GLUCOPHAGE XR) 500 MG 24 hr tablet Take 1 tablet (500 mg total) by mouth daily after supper. Patient not taking: Reported on 11/19/2017 01/10/17   Dessa PhiFunches, Josalyn, MD  nystatin cream (MYCOSTATIN) Apply to affected area 2 times daily Patient not taking: Reported on 11/19/2017 11/15/17   Aviva KluverMurray, Alyssa B, PA-C  predniSONE (DELTASONE) 20 MG tablet Take 1 tablet (20 mg total) by mouth 2 (two) times daily with a meal for 5 days. Patient not  taking: Reported on 11/19/2017 11/15/17 11/20/17  Aviva KluverMurray, Alyssa B, PA-C     Objective:  EXAM:   Vitals:   11/19/17 1451  BP: 134/88  Pulse: 78  Resp: 20  Temp: 98.2 F (36.8 C)  TempSrc: Oral  SpO2: 99%  Weight: (!) 414 lb (187.8 kg)  Height: 5\' 3"  (1.6 m)    General appearance : A&OX3. NAD. Non-toxic-appearing HEENT: Atraumatic and Normocephalic.  PERRLA. EOM intact.   Neck: supple, no JVD. No cervical lymphadenopathy. No thyromegaly.  No c-spine TTP.   Chest/Lungs:  Breathing-non-labored, Good air entry bilaterally, breath sounds normal without rales, rhonchi, or wheezing  CVS: S1 S2 regular, no murmurs, gallops, rubs  UE full S&ROM except as limited due to body habitus.  DTR=B.  Normal grip/strength Extremities: Bilateral Lower Ext shows no edema, both legs are warm to touch with = pulse throughout.  R hip with ROM limited by body habitus but WNL for size.  No bursa TTP or erythema over joint.   Neurology:  CN II-XII grossly intact, Non focal.   Psych:  TP linear. J/I WNL. Normal speech. Appropriate eye contact and affect.  Skin:  No Rash.  l foot much improved compared with ED note-there is some dry skin on the lateral aspect of the foot but no secondary infection  Data Review Lab Results  Component Value Date   HGBA1C 5.7 01/10/2017   HGBA1C 5.30 03/28/2015     Assessment & Plan   1. Hyperglycemia Controlled currently - POCT glucose (manual entry)  2. Paresthesia No red flags - TSH - Vitamin D, 25-hydroxy  3. Arthralgia of hip, unspecified laterality - ibuprofen (ADVIL,MOTRIN) 800 MG tablet; Take 1 tablet (800 mg total) by mouth every 8 (eight) hours as needed. Prn pain with food  Dispense: 30 tablet; Refill: 0 If this doesn't help, she can fill the prednisone-I did caution her on hyperglycemia/other SE with this   Patient have been counseled extensively about nutrition and exercise  Return in about 1 month (around 12/20/2017) for Dr Venetia NightAmao; f/up hyperglycemia  and paresthesias.  The patient was given clear instructions to go to ER or return to medical center if symptoms don't improve, worsen or new problems develop. The patient verbalized understanding. The patient was told to call to get lab results if they haven't heard anything in the next week.     Georgian CoAngela Bergen Magner, PA-C [facility] [place], [place] [phone]   11/19/2017, 3:20 PM

## 2017-11-20 ENCOUNTER — Other Ambulatory Visit: Payer: Self-pay | Admitting: Physician Assistant

## 2017-11-20 DIAGNOSIS — E559 Vitamin D deficiency, unspecified: Secondary | ICD-10-CM

## 2017-11-20 LAB — TSH: TSH: 1.53 u[IU]/mL (ref 0.450–4.500)

## 2017-11-20 LAB — VITAMIN D 25 HYDROXY (VIT D DEFICIENCY, FRACTURES): Vit D, 25-Hydroxy: 14.9 ng/mL — ABNORMAL LOW (ref 30.0–100.0)

## 2017-11-20 MED ORDER — VITAMIN D (ERGOCALCIFEROL) 1.25 MG (50000 UNIT) PO CAPS: 50000.0000 [IU] | ORAL_CAPSULE | ORAL | 0 refills | Status: DC

## 2017-11-21 ENCOUNTER — Telehealth: Payer: Self-pay | Admitting: *Deleted

## 2017-11-21 NOTE — Telephone Encounter (Signed)
Babs SciaraMcClung, Angela M, PA-C  Oaklee Sunga, RN        Please call patient and let them that their vitamin D is low. This can contribute to muscle aches, anxiety, fatigue, and depression. I have sent a prescription to the pharmacy for them to take once a week. We will recheck this level in 3-4 months. Her thyroid studies were normal.   Thanks,  Georgian CoAngela McClung, PA-C    Unable to disclose results to patientLeft message on voicemail to return call.

## 2017-12-02 NOTE — Telephone Encounter (Signed)
Pt called to get results for lab test. Name and DOB verified. Pt aware of results and result message.

## 2017-12-05 MED FILL — VIT D2 1.25 MG (50,000 UNIT: 1.25 MG | 28 days supply | Qty: 4 | Fill #0

## 2017-12-25 ENCOUNTER — Ambulatory Visit: Payer: Self-pay

## 2017-12-29 ENCOUNTER — Ambulatory Visit: Payer: Self-pay | Attending: Family Medicine | Admitting: Family Medicine

## 2017-12-29 ENCOUNTER — Encounter: Payer: Self-pay | Admitting: Family Medicine

## 2017-12-29 VITALS — BP 137/73 | HR 86 | Temp 97.9°F | Ht 63.0 in | Wt >= 6400 oz

## 2017-12-29 DIAGNOSIS — Z7984 Long term (current) use of oral hypoglycemic drugs: Secondary | ICD-10-CM | POA: Insufficient documentation

## 2017-12-29 DIAGNOSIS — G4733 Obstructive sleep apnea (adult) (pediatric): Secondary | ICD-10-CM | POA: Insufficient documentation

## 2017-12-29 DIAGNOSIS — Z91018 Allergy to other foods: Secondary | ICD-10-CM | POA: Insufficient documentation

## 2017-12-29 DIAGNOSIS — Z79899 Other long term (current) drug therapy: Secondary | ICD-10-CM | POA: Insufficient documentation

## 2017-12-29 DIAGNOSIS — M545 Low back pain: Secondary | ICD-10-CM | POA: Insufficient documentation

## 2017-12-29 DIAGNOSIS — K219 Gastro-esophageal reflux disease without esophagitis: Secondary | ICD-10-CM

## 2017-12-29 DIAGNOSIS — R200 Anesthesia of skin: Secondary | ICD-10-CM | POA: Insufficient documentation

## 2017-12-29 DIAGNOSIS — J45909 Unspecified asthma, uncomplicated: Secondary | ICD-10-CM | POA: Insufficient documentation

## 2017-12-29 DIAGNOSIS — F329 Major depressive disorder, single episode, unspecified: Secondary | ICD-10-CM | POA: Insufficient documentation

## 2017-12-29 DIAGNOSIS — R739 Hyperglycemia, unspecified: Secondary | ICD-10-CM | POA: Insufficient documentation

## 2017-12-29 DIAGNOSIS — R202 Paresthesia of skin: Secondary | ICD-10-CM

## 2017-12-29 DIAGNOSIS — G47419 Narcolepsy without cataplexy: Secondary | ICD-10-CM

## 2017-12-29 DIAGNOSIS — Z131 Encounter for screening for diabetes mellitus: Secondary | ICD-10-CM

## 2017-12-29 DIAGNOSIS — J181 Lobar pneumonia, unspecified organism: Secondary | ICD-10-CM

## 2017-12-29 DIAGNOSIS — F909 Attention-deficit hyperactivity disorder, unspecified type: Secondary | ICD-10-CM | POA: Insufficient documentation

## 2017-12-29 DIAGNOSIS — N920 Excessive and frequent menstruation with regular cycle: Secondary | ICD-10-CM | POA: Insufficient documentation

## 2017-12-29 DIAGNOSIS — E559 Vitamin D deficiency, unspecified: Secondary | ICD-10-CM | POA: Insufficient documentation

## 2017-12-29 DIAGNOSIS — J189 Pneumonia, unspecified organism: Secondary | ICD-10-CM

## 2017-12-29 DIAGNOSIS — Z79890 Hormone replacement therapy (postmenopausal): Secondary | ICD-10-CM | POA: Insufficient documentation

## 2017-12-29 DIAGNOSIS — Z791 Long term (current) use of non-steroidal anti-inflammatories (NSAID): Secondary | ICD-10-CM | POA: Insufficient documentation

## 2017-12-29 DIAGNOSIS — G47411 Narcolepsy with cataplexy: Secondary | ICD-10-CM | POA: Insufficient documentation

## 2017-12-29 LAB — POCT GLYCOSYLATED HEMOGLOBIN (HGB A1C): HEMOGLOBIN A1C: 5.6

## 2017-12-29 MED ORDER — OMEPRAZOLE 20 MG PO CPDR: 20.0000 mg | DELAYED_RELEASE_CAPSULE | Freq: Every day | ORAL | 3 refills | Status: DC

## 2017-12-29 MED ORDER — AZITHROMYCIN 250 MG PO TABS: ORAL_TABLET | ORAL | 0 refills | Status: DC

## 2017-12-29 MED ORDER — GABAPENTIN 300 MG PO CAPS: 300.0000 mg | ORAL_CAPSULE | Freq: Three times a day (TID) | ORAL | 3 refills | Status: DC

## 2017-12-29 MED ORDER — BENZONATATE 100 MG PO CAPS: 100.0000 mg | ORAL_CAPSULE | Freq: Two times a day (BID) | ORAL | 0 refills | Status: DC | PRN

## 2017-12-29 MED FILL — GABAPENTIN 300 MG CAPSULE: 300 | 30 days supply | Qty: 90 | Fill #0

## 2017-12-29 MED FILL — AZITHROMYCIN 250 MG TABLET: 250 | 5 days supply | Qty: 6 | Fill #0

## 2017-12-29 NOTE — Progress Notes (Signed)
Subjective:  Patient ID: [name], female    DOB: 1991-05-11  Age: 27 y.o. MRN: [id number]  CC: Hyperglycemia   HPI Helmi A Lennox is a 27 year old female with a history of morbid obesity, Obstructive sleep apnea, anemia secondary to menorrhagia ( last Hgb 12.6 in 11/2017 ; s/p Feraheme transfusion in the recent past), chronic migraines, narcolepsy who presents today for follow-up visit.  At her last visit with the PA , 5 weeks ago she had presented with paresthesias and hyperglycemia.  Her A1c today is 5.6. She informs me of intermittent burning which occur in her left upper chest wall, breast, low back pain, legs and some numbness in the left fifth toe which is absent at this time and is unprecipitated by any activity. She denies low back pain and denies loss of sphincteric function or visual abnormalities.  She also denies weakness in her extremities. Labs done at last visit revealed a normal thyroid panel, normal hemoglobin, vitamin D deficiency for which she is currently on replacement.  She is sleepy during the encounter and informs me she has been unable to obtain her Nuvigil due to pending paperwork which she needs to turn into the pharmacy to complete her application for the patient assistance program.  She endorses a cough for the last 4 days with associated shortness of breath, malaise.  The cough is sometimes productive of whitish sputum. Denies wheezing.  Past Medical History:  Diagnosis Date  . Acid reflux   . ADHD   . Asthma   . Clotting disorder (HCC)   . Depression   . 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 Exogenous obesity   . Narcolepsy cataplexy syndrome 2007   Prior sleep studies starting on September 26 2005, AHI 5.3 per hour,  . Poor sleep hygiene     Past Surgical History:  Procedure Laterality Date  . none      Allergies  Allergen Reactions    . Banana Itching  . Food Itching    All fruits     Outpatient Medications Prior to Visit  Medication Sig Dispense Refill  . buPROPion (WELLBUTRIN XL) 300 MG 24 hr tablet Take 1 tablet (300 mg total) by mouth daily. 30 tablet 5  . ibuprofen (ADVIL,MOTRIN) 800 MG tablet Take 1 tablet (800 mg total) by mouth every 8 (eight) hours as needed. Prn pain with food 30 tablet 0  . metFORMIN (GLUCOPHAGE XR) 500 MG 24 hr tablet Take 1 tablet (500 mg total) by mouth daily after supper. 30 tablet 2  . Vitamin D, Ergocalciferol, (DRISDOL) 50000 units CAPS capsule Take 1 capsule (50,000 Units total) by mouth every 7 (seven) days. 16 capsule 0  . omeprazole (PRILOSEC) 20 MG capsule Take 2 capsules (40 mg total) by mouth daily before supper. 60 capsule 5  . acetaminophen (TYLENOL) 500 MG tablet Take 1,000 mg by mouth every 6 (six) hours as needed for mild pain.    . cetirizine (ZYRTEC) 10 MG tablet Take 1 tablet (10 mg total) by mouth daily. (Patient not taking: Reported on 12/29/2017) 30 tablet 11  . ferrous sulfate 325 (65 FE) MG tablet Take 1 tablet (325 mg total) by mouth 3 (three) times daily with meals. (Patient not taking: Reported on 12/29/2017) 90 tablet 5  . furosemide (LASIX) 20 MG tablet Take 1 tablet (20 mg total) by mouth daily. (Patient not taking: Reported on 11/19/2017)  14 tablet 0  . medroxyPROGESTERone (DEPO-PROVERA) 150 MG/ML injection Inject 150 mg into the muscle every 3 (three) months.    . naproxen (NAPROSYN) 250 MG tablet Take 250 mg by mouth daily as needed for mild pain.    Marland Kitchen nystatin cream (MYCOSTATIN) Apply to affected area 2 times daily (Patient not taking: Reported on 11/19/2017) 30 g 0   No facility-administered medications prior to visit.     ROS Review of Systems  Constitutional: Positive for fatigue. Negative for activity change and appetite change.  HENT: Negative for congestion, sinus pressure and sore throat.   Eyes: Negative for visual disturbance.  Respiratory:  Positive for cough and shortness of breath. Negative for chest tightness and wheezing.   Cardiovascular: Negative for chest pain and palpitations.  Gastrointestinal: Negative for abdominal distention, abdominal pain and constipation.  Endocrine: Negative for polydipsia.  Genitourinary: Negative for dysuria and frequency.  Musculoskeletal: Negative for arthralgias and back pain.  Skin: Negative for rash.  Neurological: Positive for numbness. Negative for tremors and light-headedness.  Hematological: Does not bruise/bleed easily.  Psychiatric/Behavioral: Negative for agitation and behavioral problems.    Objective:  BP 137/73   Pulse 86   Temp 97.9 F (36.6 C) (Oral)   Ht 5\' 3"  (1.6 m)   Wt (!) 420 lb (190.5 kg)   SpO2 99%   BMI 74.40 kg/m   BP/Weight 12/29/2017 11/19/2017 11/15/2017  Systolic BP 137 134 133  Diastolic BP 73 88 86  Wt. (Lbs) 420 414 -  BMI 74.4 73.34 -      Physical Exam  Constitutional: She is oriented to person, place, and time. She appears well-developed and well-nourished.  Cardiovascular: Normal rate, normal heart sounds and intact distal pulses.  No murmur heard. Pulmonary/Chest: Effort normal. She has no wheezes. She has no rales. She exhibits no tenderness.  Decreased breath sounds in right upper lung fields Normal breath sounds in all zones left lung  Abdominal: Soft. Bowel sounds are normal. She exhibits no distension and no mass. There is no tenderness.  Musculoskeletal: Normal range of motion.  Neurological: She is oriented to person, place, and time.  Sleepy during encounter  Skin: Skin is warm and dry.  Psychiatric: She has a normal mood and affect.   CMP Latest Ref Rng & Units 11/15/2017 07/01/2017 12/30/2016  Glucose 65 - 99 mg/dL 95 161(W) 89  BUN 6 - 20 mg/dL 10 9 7   Creatinine 0.44 - 1.00 mg/dL 9.60(A) 5.40 9.81  Sodium 135 - 145 mmol/L 141 138 138  Potassium 3.5 - 5.1 mmol/L 4.2 3.9 3.8  Chloride 101 - 111 mmol/L 107 108 105  CO2 22 -  32 mmol/L - 20(L) 24  Calcium 8.9 - 10.3 mg/dL - 9.0 9.0  Total Protein 6.5 - 8.1 g/dL - 7.8 -  Total Bilirubin 0.3 - 1.2 mg/dL - 0.9 -  Alkaline Phos 38 - 126 U/L - 96 -  AST 15 - 41 U/L - 18 -  ALT 14 - 54 U/L - 20 -      Assessment & Plan:   1. Narcolepsy without cataplexy Currently on Nuvigil which she is awaiting to obtain via the patient assistance program  2. Gastroesophageal reflux disease, esophagitis presence not specified Uncontrolled as she has been out of medications which I have refilled - omeprazole (PRILOSEC) 20 MG capsule; Take 1 capsule (20 mg total) by mouth daily.  Dispense: 30 capsule; Refill: 3  3. Screening for diabetes mellitus A1c 5.6 - POCT glycosylated  hemoglobin (Hb A1C)  4. Paresthesia Unknown etiology We will place on gabapentin -discussed sedating side effects and she has been advised to take it at bedtime if too sedated - gabapentin (NEURONTIN) 300 MG capsule; Take 1 capsule (300 mg total) by mouth 3 (three) times daily.  Dispense: 90 capsule; Refill: 3 - Vitamin B12  5. Pneumonia of right upper lobe due to infectious organism [facility](HCC) We will treat presumptively for community-acquired pneumonia based on clinical findings - azithromycin (ZITHROMAX) 250 MG tablet; Take orally 2 tabs (500 mg) on day 1 then 1 tab (250 mg) on days 2-5  Dispense: 6 tablet; Refill: 0 - benzonatate (TESSALON) 100 MG capsule; Take 1 capsule (100 mg total) by mouth 2 (two) times daily as needed for cough.  Dispense: 20 capsule; Refill: 0   Meds ordered this encounter  Medications  . gabapentin (NEURONTIN) 300 MG capsule    Sig: Take 1 capsule (300 mg total) by mouth 3 (three) times daily.    Dispense:  90 capsule    Refill:  3  . azithromycin (ZITHROMAX) 250 MG tablet    Sig: Take orally 2 tabs (500 mg) on day 1 then 1 tab (250 mg) on days 2-5    Dispense:  6 tablet    Refill:  0  . benzonatate (TESSALON) 100 MG capsule    Sig: Take 1 capsule (100 mg total) by mouth  2 (two) times daily as needed for cough.    Dispense:  20 capsule    Refill:  0  . omeprazole (PRILOSEC) 20 MG capsule    Sig: Take 1 capsule (20 mg total) by mouth daily.    Dispense:  30 capsule    Refill:  3    Follow-up: Return in about 3 months (around 03/29/2018) for Follow-up of chronic medical conditions.   Jaclyn ShaggyEnobong Amao MD

## 2017-12-29 NOTE — Patient Instructions (Signed)

## 2017-12-30 ENCOUNTER — Telehealth: Payer: Self-pay

## 2017-12-30 LAB — VITAMIN B12: Vitamin B-12: 771 pg/mL (ref 232–1245)

## 2017-12-30 MED FILL — ?OMEPRAZOLE DR 20MG CAPSULE: 20 | 30 days supply | Qty: 30 | Fill #0

## 2017-12-30 MED FILL — BENZONATATE 100 MG CAPSULE: 100 | 10 days supply | Qty: 20 | Fill #0

## 2017-12-30 NOTE — Telephone Encounter (Signed)
Pt was called and informed of lab results. 

## 2018-01-08 ENCOUNTER — Ambulatory Visit: Payer: Self-pay

## 2018-01-21 ENCOUNTER — Other Ambulatory Visit: Payer: Self-pay | Admitting: Family Medicine

## 2018-01-21 ENCOUNTER — Ambulatory Visit: Payer: Self-pay | Attending: Family Medicine

## 2018-01-21 DIAGNOSIS — M25559 Pain in unspecified hip: Secondary | ICD-10-CM

## 2018-01-21 MED ORDER — IBUPROFEN 600 MG PO TABS: 600.0000 mg | ORAL_TABLET | Freq: Three times a day (TID) | ORAL | 0 refills | Status: DC | PRN

## 2018-01-27 ENCOUNTER — Ambulatory Visit: Payer: Self-pay | Attending: Family Medicine | Admitting: Family Medicine

## 2018-01-27 ENCOUNTER — Encounter: Payer: Self-pay | Admitting: Family Medicine

## 2018-01-27 VITALS — BP 120/83 | HR 80 | Temp 98.0°F | Ht 63.0 in | Wt >= 6400 oz

## 2018-01-27 DIAGNOSIS — J45909 Unspecified asthma, uncomplicated: Secondary | ICD-10-CM | POA: Insufficient documentation

## 2018-01-27 DIAGNOSIS — G4733 Obstructive sleep apnea (adult) (pediatric): Secondary | ICD-10-CM | POA: Insufficient documentation

## 2018-01-27 DIAGNOSIS — N920 Excessive and frequent menstruation with regular cycle: Secondary | ICD-10-CM | POA: Insufficient documentation

## 2018-01-27 DIAGNOSIS — K219 Gastro-esophageal reflux disease without esophagitis: Secondary | ICD-10-CM | POA: Insufficient documentation

## 2018-01-27 DIAGNOSIS — G43909 Migraine, unspecified, not intractable, without status migrainosus: Secondary | ICD-10-CM | POA: Insufficient documentation

## 2018-01-27 DIAGNOSIS — M25562 Pain in left knee: Secondary | ICD-10-CM

## 2018-01-27 DIAGNOSIS — F909 Attention-deficit hyperactivity disorder, unspecified type: Secondary | ICD-10-CM | POA: Insufficient documentation

## 2018-01-27 DIAGNOSIS — Z79899 Other long term (current) drug therapy: Secondary | ICD-10-CM | POA: Insufficient documentation

## 2018-01-27 DIAGNOSIS — M7989 Other specified soft tissue disorders: Secondary | ICD-10-CM

## 2018-01-27 DIAGNOSIS — G47419 Narcolepsy without cataplexy: Secondary | ICD-10-CM | POA: Insufficient documentation

## 2018-01-27 DIAGNOSIS — F329 Major depressive disorder, single episode, unspecified: Secondary | ICD-10-CM | POA: Insufficient documentation

## 2018-01-27 DIAGNOSIS — Z7984 Long term (current) use of oral hypoglycemic drugs: Secondary | ICD-10-CM | POA: Insufficient documentation

## 2018-01-27 DIAGNOSIS — M722 Plantar fascial fibromatosis: Secondary | ICD-10-CM

## 2018-01-27 MED ORDER — FUROSEMIDE 20 MG PO TABS: 20.0000 mg | ORAL_TABLET | Freq: Every day | ORAL | 0 refills | Status: DC

## 2018-01-27 MED ORDER — MELOXICAM 7.5 MG PO TABS
7.5000 mg | ORAL_TABLET | Freq: Every day | ORAL | 1 refills | Status: DC
Start: 2018-01-27 — End: 2018-05-05

## 2018-01-27 MED FILL — MELOXICAM 7.5 MG TABLET: 7.5 | 30 days supply | Qty: 30 | Fill #0

## 2018-01-27 MED FILL — ?FUROSEMIDE 20 MG TABLET: 20 | 14 days supply | Qty: 14 | Fill #0

## 2018-01-27 NOTE — Patient Instructions (Signed)

## 2018-01-27 NOTE — Progress Notes (Signed)
Subjective:  Patient ID: [name], female    DOB: November 11, 1991  Age: 27 y.o. MRN: [id number]  CC: knee pain  HPI [name] is a 27 year old female with a history of morbid obesity, Obstructive sleep apnea, anemia secondary to menorrhagia ( last Hgb 12.6 in 11/2017 ; s/p Feraheme transfusion in the past), chronic migraines, narcolepsy who presents today acute visit complaining of a 2-week history of left knee pain with no preceding history of trauma and associated swelling of her left upper leg. She also complains of pain in the ball of her right foot and medial aspect of her right foot more with weightbearing. Pain is described as moderate and is improved when she wakes up in the morning but worsens as the day progresses. Is uncontrolled on ibuprofen.  Past Medical History:  Diagnosis Date  . Acid reflux   . ADHD   . Asthma   . Clotting disorder (HCC)   . Depression   . 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 Exogenous obesity   . Narcolepsy cataplexy syndrome 2007   Prior sleep studies starting on September 26 2005, AHI 5.3 per hour,  . Poor sleep hygiene     Past Surgical History:  Procedure Laterality Date  . none      Allergies  Allergen Reactions  . Banana Itching  . Food Itching    All fruits     Outpatient Medications Prior to Visit  Medication Sig Dispense Refill  . acetaminophen (TYLENOL) 500 MG tablet Take 1,000 mg by mouth every 6 (six) hours as needed for mild pain.    Marland Kitchen. buPROPion (WELLBUTRIN XL) 300 MG 24 hr tablet Take 1 tablet (300 mg total) by mouth daily. 30 tablet 5  . metFORMIN (GLUCOPHAGE XR) 500 MG 24 hr tablet Take 1 tablet (500 mg total) by mouth daily after supper. 30 tablet 2  . naproxen (NAPROSYN) 250 MG tablet Take 250 mg by mouth daily as needed for mild pain.    Marland Kitchen. omeprazole (PRILOSEC) 20 MG capsule Take 1 capsule (20 mg total) by  mouth daily. 30 capsule 3  . ibuprofen (ADVIL,MOTRIN) 600 MG tablet Take 1 tablet (600 mg total) by mouth every 8 (eight) hours as needed. Prn pain with food 40 tablet 0  . azithromycin (ZITHROMAX) 250 MG tablet Take orally 2 tabs (500 mg) on day 1 then 1 tab (250 mg) on days 2-5 (Patient not taking: Reported on 01/27/2018) 6 tablet 0  . benzonatate (TESSALON) 100 MG capsule Take 1 capsule (100 mg total) by mouth 2 (two) times daily as needed for cough. (Patient not taking: Reported on 01/27/2018) 20 capsule 0  . cetirizine (ZYRTEC) 10 MG tablet Take 1 tablet (10 mg total) by mouth daily. (Patient not taking: Reported on 12/29/2017) 30 tablet 11  . ferrous sulfate 325 (65 FE) MG tablet Take 1 tablet (325 mg total) by mouth 3 (three) times daily with meals. (Patient not taking: Reported on 12/29/2017) 90 tablet 5  . gabapentin (NEURONTIN) 300 MG capsule Take 1 capsule (300 mg total) by mouth 3 (three) times daily. (Patient not taking: Reported on 01/27/2018) 90 capsule 3  . medroxyPROGESTERone (DEPO-PROVERA) 150 MG/ML injection Inject 150 mg into the muscle every 3 (three) months.    . nystatin cream (MYCOSTATIN) Apply to affected area 2 times daily (Patient not taking: Reported on 11/19/2017) 30 g 0  . Vitamin  D, Ergocalciferol, (DRISDOL) 50000 units CAPS capsule Take 1 capsule (50,000 Units total) by mouth every 7 (seven) days. (Patient not taking: Reported on 01/27/2018) 16 capsule 0  . furosemide (LASIX) 20 MG tablet Take 1 tablet (20 mg total) by mouth daily. (Patient not taking: Reported on 11/19/2017) 14 tablet 0   No facility-administered medications prior to visit.     ROS Review of Systems  Constitutional: Negative for activity change, appetite change and fatigue.  HENT: Negative for congestion, sinus pressure and sore throat.   Eyes: Negative for visual disturbance.  Respiratory: Negative for cough, chest tightness, shortness of breath and wheezing.   Cardiovascular: Negative for chest pain  and palpitations.  Gastrointestinal: Negative for abdominal distention, abdominal pain and constipation.  Endocrine: Negative for polydipsia.  Genitourinary: Negative for dysuria and frequency.  Musculoskeletal:       See hpi  Skin: Negative for rash.  Neurological: Negative for tremors, light-headedness and numbness.  Hematological: Does not bruise/bleed easily.  Psychiatric/Behavioral: Negative for agitation and behavioral problems.    Objective:  BP 120/83   Pulse 80   Temp 98 F (36.7 C) (Oral)   Ht 5\' 3"  (1.6 m)   Wt (!) 421 lb (191 kg)   LMP 07/16/2017   SpO2 93%   BMI 74.58 kg/m   BP/Weight 01/27/2018 12/29/2017 11/19/2017  Systolic BP 120 137 134  Diastolic BP 83 73 88  Wt. (Lbs) 421 420 414  BMI 74.58 74.4 73.34     Physical Exam  Constitutional: She is oriented to person, place, and time. She appears well-developed and well-nourished.  Cardiovascular: Normal rate, normal heart sounds and intact distal pulses.  No murmur heard. Pulmonary/Chest: Effort normal and breath sounds normal. She has no wheezes. She has no rales. She exhibits no tenderness.  Abdominal: Soft. Bowel sounds are normal. She exhibits no distension and no mass. There is no tenderness.  Musculoskeletal: Normal range of motion. She exhibits edema (1+ edema of medial aspect of upper leg). She exhibits no tenderness (negative Homan's sign).  Tenderness on palpation of right foot  Neurological: She is alert and oriented to person, place, and time.  Psychiatric: She has a normal mood and affect.     Assessment & Plan:   1. Acute pain of left knee Advised that weight loss will help with symptoms advised to use insoles - meloxicam (MOBIC) 7.5 MG tablet; Take 1 tablet (7.5 mg total) by mouth daily.  Dispense: 30 tablet; Refill: 1  2. Plantar fasciitis Advised to use insoles - meloxicam (MOBIC) 7.5 MG tablet; Take 1 tablet (7.5 mg total) by mouth daily.  Dispense: 30 tablet; Refill: 1  3. Leg  swelling Low suspicion for DVT Elevate leg Use compression stockings Patient to notify the clinic in the event that she develops fever, redness - furosemide (LASIX) 20 MG tablet; Take 1 tablet (20 mg total) by mouth daily.  Dispense: 14 tablet; Refill: 0   Meds ordered this encounter  Medications  . meloxicam (MOBIC) 7.5 MG tablet    Sig: Take 1 tablet (7.5 mg total) by mouth daily.    Dispense:  30 tablet    Refill:  1    Discontinue ibuprofen  . furosemide (LASIX) 20 MG tablet    Sig: Take 1 tablet (20 mg total) by mouth daily.    Dispense:  14 tablet    Refill:  0    Follow-up: Return for Follow-up of chronic medical conditions, keep previously scheduled appointment.   Ronisha Herringshaw  Margarita Rana MD

## 2018-02-13 ENCOUNTER — Other Ambulatory Visit: Payer: Self-pay

## 2018-02-13 ENCOUNTER — Emergency Department (HOSPITAL_COMMUNITY): Payer: Self-pay

## 2018-02-13 ENCOUNTER — Encounter (HOSPITAL_COMMUNITY): Payer: Self-pay

## 2018-02-13 ENCOUNTER — Emergency Department (HOSPITAL_COMMUNITY)
Admission: EM | Admit: 2018-02-13 | Discharge: 2018-02-13 | Disposition: A | Discharge status: Home / Self Care | Payer: Self-pay | Attending: Emergency Medicine | Admitting: Emergency Medicine

## 2018-02-13 DIAGNOSIS — S8392XA Sprain of unspecified site of left knee, initial encounter: Secondary | ICD-10-CM | POA: Insufficient documentation

## 2018-02-13 DIAGNOSIS — Y929 Unspecified place or not applicable: Secondary | ICD-10-CM | POA: Insufficient documentation

## 2018-02-13 DIAGNOSIS — X500XXA Overexertion from strenuous movement or load, initial encounter: Secondary | ICD-10-CM | POA: Insufficient documentation

## 2018-02-13 DIAGNOSIS — Y999 Unspecified external cause status: Secondary | ICD-10-CM | POA: Insufficient documentation

## 2018-02-13 DIAGNOSIS — Z7984 Long term (current) use of oral hypoglycemic drugs: Secondary | ICD-10-CM | POA: Insufficient documentation

## 2018-02-13 DIAGNOSIS — Z79899 Other long term (current) drug therapy: Secondary | ICD-10-CM | POA: Insufficient documentation

## 2018-02-13 DIAGNOSIS — M722 Plantar fascial fibromatosis: Secondary | ICD-10-CM | POA: Insufficient documentation

## 2018-02-13 DIAGNOSIS — J45909 Unspecified asthma, uncomplicated: Secondary | ICD-10-CM | POA: Insufficient documentation

## 2018-02-13 DIAGNOSIS — F17210 Nicotine dependence, cigarettes, uncomplicated: Secondary | ICD-10-CM | POA: Insufficient documentation

## 2018-02-13 DIAGNOSIS — Y939 Activity, unspecified: Secondary | ICD-10-CM | POA: Insufficient documentation

## 2018-02-13 MED ORDER — HYDROCODONE-ACETAMINOPHEN 5-325 MG PO TABS: 1.0000 | ORAL_TABLET | ORAL | 0 refills | Status: DC | PRN

## 2018-02-13 MED ORDER — IBUPROFEN 600 MG PO TABS: 600.0000 mg | ORAL_TABLET | Freq: Four times a day (QID) | ORAL | 0 refills | Status: DC | PRN

## 2018-02-13 MED ORDER — KETOROLAC TROMETHAMINE 30 MG/ML IJ SOLN
30.0000 mg | Freq: Once | INTRAMUSCULAR | Status: AC
  Administered 2018-02-13: 30 mg via INTRAMUSCULAR
  Filled 2018-02-13: qty 1

## 2018-02-13 NOTE — ED Notes (Signed)
Pt transported to xray 

## 2018-02-13 NOTE — ED Triage Notes (Signed)
Pt presents to the ed with complaints of pain in her left knee and right foot x 3 weeks. Denies any injury. Ambulatory with cane.

## 2018-02-13 NOTE — ED Provider Notes (Signed)
[facility] EMERGENCY DEPARTMENT Provider Note   CSN: [id number] Arrival date & time: 02/13/18  1421     History   Chief Complaint Chief Complaint  Patient presents with  . Knee Pain    HPI [name] is a 27 y.o. female.  Pt presents to the ED today with left knee pain and right foot pain.  Sx have been going on for 3 weeks.  She did not fall.  No known trauma.  The pt is morbidly obese.      Past Medical History:  Diagnosis Date  . Acid reflux   . ADHD   . Asthma   . Clotting disorder (HCC)   . Depression   . 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 Exogenous obesity   . Narcolepsy cataplexy syndrome 2007   Prior sleep studies starting on September 26 2005, AHI 5.3 per hour,  . Poor sleep hygiene     Patient Active Problem List   Diagnosis Date Noted  . Migraine 06/02/2017  . Intercostal pain 08/25/2016  . Knee pain, bilateral 07/14/2016  . Venous stasis dermatitis of both lower extremities 05/26/2016  . Stye external 03/25/2016  . Plantar fasciitis, right 03/25/2016  . Leg swelling 11/17/2015  . Headache 09/08/2015  . Pain of molar 05/26/2015  . Sebaceous cyst of left axilla 04/13/2015  . Vitamin D deficiency 03/29/2015  . Allergic rhinitis 04/11/2010  . Iron deficiency anemia 03/09/2010  . Esophageal reflux 03/24/2009  . MENORRHAGIA 03/17/2009  . Morbid obesity with body mass index of 70 and over in adult [facility]) 05/15/2007  . Depression 02/12/2007  . 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 none      OB History    No data available       Home Medications    Prior to Admission medications   Medication Sig Start Date End Date Taking? Authorizing Provider  acetaminophen (TYLENOL) 500 MG tablet Take 1,000  mg by mouth every 6 (six) hours as needed for mild pain.    [provider]  azithromycin (ZITHROMAX) 250 MG tablet Take orally 2 tabs (500 mg) on day 1 then 1 tab (250 mg) on days 2-5 Patient not taking: Reported on 01/27/2018 12/29/17   Hoy Register, MD  benzonatate (TESSALON) 100 MG capsule Take 1 capsule (100 mg total) by mouth 2 (two) times daily as needed for cough. Patient not taking: Reported on 01/27/2018 12/29/17   Hoy Register, MD  buPROPion (WELLBUTRIN XL) 300 MG 24 hr tablet Take 1 tablet (300 mg total) by mouth daily. 02/13/17   Funches, Gerilyn Nestle, MD  cetirizine (ZYRTEC) 10 MG tablet Take 1 tablet (10 mg total) by mouth daily. Patient not taking: Reported on 12/29/2017 02/13/17   Dessa Phi, MD  ferrous sulfate 325 (65 FE) MG tablet Take 1 tablet (325 mg total) by mouth 3 (three) times daily with meals. Patient not taking: Reported on 12/29/2017 02/13/17   Dessa Phi, MD  furosemide (LASIX) 20 MG tablet Take 1 tablet (20 mg total) by mouth daily. 01/27/18   Hoy Register, MD  gabapentin (NEURONTIN) 300 MG capsule Take 1 capsule (300 mg total) by mouth 3 (three) times daily. Patient not taking: Reported on 01/27/2018 12/29/17   Hoy Register, MD  HYDROcodone-acetaminophen (NORCO/VICODIN) 5-325 MG tablet Take  1 tablet by mouth every 4 (four) hours as needed. 02/13/18   Jacalyn LefevreHaviland, Rashana Andrew, MD  ibuprofen (ADVIL,MOTRIN) 600 MG tablet Take 1 tablet (600 mg total) by mouth every 6 (six) hours as needed. 02/13/18   Jacalyn LefevreHaviland, Colby Catanese, MD  medroxyPROGESTERone (DEPO-PROVERA) 150 MG/ML injection Inject 150 mg into the muscle every 3 (three) months.    [provider]  meloxicam (MOBIC) 7.5 MG tablet Take 1 tablet (7.5 mg total) by mouth daily. 01/27/18   Hoy RegisterNewlin, Enobong, MD  metFORMIN (GLUCOPHAGE XR) 500 MG 24 hr tablet Take 1 tablet (500 mg total) by mouth daily after supper. 01/10/17   Funches, Gerilyn NestleJosalyn, MD  naproxen (NAPROSYN) 250 MG tablet Take 250 mg by mouth daily as needed for  mild pain.    [provider]  nystatin cream (MYCOSTATIN) Apply to affected area 2 times daily Patient not taking: Reported on 11/19/2017 11/15/17   Aviva KluverMurray, Alyssa B, PA-C  omeprazole (PRILOSEC) 20 MG capsule Take 1 capsule (20 mg total) by mouth daily. 12/29/17   Hoy RegisterNewlin, Enobong, MD  Vitamin D, Ergocalciferol, (DRISDOL) 50000 units CAPS capsule Take 1 capsule (50,000 Units total) by mouth every 7 (seven) days. Patient not taking: Reported on 01/27/2018 11/20/17   Anders SimmondsMcClung, Angela M, PA-C  norgestimate-ethinyl estradiol (SPRINTEC 28) 0.25-35 MG-MCG tablet Take 1 tablet by mouth daily. Patient not taking: Reported on 05/06/2016 03/22/16 05/06/16  Dessa PhiFunches, Josalyn, MD    Family History Family History  Problem Relation Age of Onset  . Hypertension Mother   . Asthma Mother   . Alcohol abuse Father   . Asthma Brother   . Alcohol abuse Maternal Grandmother     Social History Social History   Tobacco Use  . Smoking status: Current Some Day Smoker    Types: Cigarettes  . Smokeless tobacco: Never Used  . Tobacco comment: 2 cigarettes/ day  Substance Use Topics  . Alcohol use: Yes    Comment: ocassional  . Drug use: Yes    Types: Marijuana     Allergies   Banana and Food   Review of Systems Review of Systems  Musculoskeletal:       Left knee pain.  Right foot pain.  All other systems reviewed and are negative.    Physical Exam Updated Vital Signs BP 114/65 (BP Location: Right Arm)   Pulse 80   Temp 98.1 F (36.7 C) (Oral)   Resp 20   Wt (!) 191 kg (421 lb)   SpO2 100%   BMI 74.58 kg/m   Physical Exam  Constitutional: She is oriented to person, place, and time. She appears well-developed and well-nourished.  HENT:  Head: Normocephalic and atraumatic.  Right Ear: External ear normal.  Left Ear: External ear normal.  Nose: Nose normal.  Mouth/Throat: Oropharynx is clear and moist.  Eyes: Conjunctivae and EOM are normal. Pupils are equal, round, and reactive to  light.  Neck: Normal range of motion. Neck supple.  Cardiovascular: Normal rate, regular rhythm, normal heart sounds and intact distal pulses.  Pulmonary/Chest: Effort normal and breath sounds normal.  Abdominal: Soft. Bowel sounds are normal.  Musculoskeletal:       Left knee: Tenderness found.  Tenderness to plantar fascia  Neurological: She is alert and oriented to person, place, and time.  Skin: Skin is warm and dry. Capillary refill takes less than 2 seconds.  Psychiatric: She has a normal mood and affect. Her behavior is normal. Judgment and thought content normal.  Nursing note and vitals reviewed.  ED Treatments / Results  Labs (all labs ordered are listed, but only abnormal results are displayed) Labs Reviewed - No data to display  EKG  EKG Interpretation None       Radiology Dg Knee Complete 4 Views Left  Result Date: 02/13/2018 CLINICAL DATA:  Pain in the left knee EXAM: LEFT KNEE - COMPLETE 4+ VIEW COMPARISON:  None. FINDINGS: No acute displaced fracture or malalignment. Minimal patellar spurring. Possible knee effusion. Joint spaces are maintained IMPRESSION: 1. No acute osseous abnormality 2. Possible knee effusion Electronically Signed   By: Jasmine Pang M.D.   On: 02/13/2018 19:18   Dg Foot Complete Right  Result Date: 02/13/2018 CLINICAL DATA:  Foot pain EXAM: RIGHT FOOT COMPLETE - 3+ VIEW COMPARISON:  08/21/2006 FINDINGS: There is no evidence of fracture or dislocation. There is no evidence of arthropathy or other focal bone abnormality. Soft tissues are unremarkable. IMPRESSION: Negative. Electronically Signed   By: Jasmine Pang M.D.   On: 02/13/2018 19:18    Procedures Procedures (including critical care time)  Medications Ordered in ED Medications  ketorolac (TORADOL) 30 MG/ML injection 30 mg (30 mg Intramuscular Given 02/13/18 1745)     Initial Impression / Assessment and Plan / ED Course  I have reviewed the triage vital signs and the nursing  notes.  Pertinent labs & imaging results that were available during my care of the patient were reviewed by me and considered in my medical decision making (see chart for details).  Pt is able to ambulate with cane.  She is instructed to f/u with ortho and to return if worse.  Final Clinical Impressions(s) / ED Diagnoses   Final diagnoses:  Sprain of left knee, unspecified ligament, initial encounter  Plantar fasciitis of right foot  Morbid obesity [facility])    ED Discharge Orders        Ordered    ibuprofen (ADVIL,MOTRIN) 600 MG tablet  Every 6 hours PRN     02/13/18 1927    HYDROcodone-acetaminophen (NORCO/VICODIN) 5-325 MG tablet  Every 4 hours PRN     02/13/18 1927       Jacalyn Lefevre, MD 02/13/18 1942

## 2018-03-09 ENCOUNTER — Telehealth: Payer: Self-pay | Admitting: Family Medicine

## 2018-03-09 NOTE — Telephone Encounter (Signed)
Patient called asking for a note for food stamps saying she cannot work.

## 2018-03-10 ENCOUNTER — Encounter: Payer: Self-pay | Admitting: Family Medicine

## 2018-03-10 NOTE — Telephone Encounter (Signed)
I called to inform the patient that the letter was ready for pick up there was no answer. Also there was no vm

## 2018-03-10 NOTE — Telephone Encounter (Signed)
Letter is ready for pick-up.

## 2018-03-31 ENCOUNTER — Ambulatory Visit: Payer: Self-pay | Admitting: Family Medicine

## 2018-05-05 ENCOUNTER — Ambulatory Visit: Payer: Self-pay | Admitting: Licensed Clinical Social Worker

## 2018-05-05 ENCOUNTER — Encounter

## 2018-05-05 ENCOUNTER — Ambulatory Visit: Payer: Self-pay | Attending: Family Medicine | Admitting: Family Medicine

## 2018-05-05 ENCOUNTER — Encounter: Payer: Self-pay | Admitting: Family Medicine

## 2018-05-05 DIAGNOSIS — F331 Major depressive disorder, recurrent, moderate: Secondary | ICD-10-CM

## 2018-05-05 DIAGNOSIS — Z79899 Other long term (current) drug therapy: Secondary | ICD-10-CM | POA: Insufficient documentation

## 2018-05-05 DIAGNOSIS — Z6841 Body Mass Index (BMI) 40.0 and over, adult: Secondary | ICD-10-CM | POA: Insufficient documentation

## 2018-05-05 DIAGNOSIS — F909 Attention-deficit hyperactivity disorder, unspecified type: Secondary | ICD-10-CM | POA: Insufficient documentation

## 2018-05-05 DIAGNOSIS — G43909 Migraine, unspecified, not intractable, without status migrainosus: Secondary | ICD-10-CM | POA: Insufficient documentation

## 2018-05-05 DIAGNOSIS — K219 Gastro-esophageal reflux disease without esophagitis: Secondary | ICD-10-CM | POA: Insufficient documentation

## 2018-05-05 DIAGNOSIS — Z791 Long term (current) use of non-steroidal anti-inflammatories (NSAID): Secondary | ICD-10-CM | POA: Insufficient documentation

## 2018-05-05 DIAGNOSIS — F329 Major depressive disorder, single episode, unspecified: Secondary | ICD-10-CM | POA: Insufficient documentation

## 2018-05-05 DIAGNOSIS — G47411 Narcolepsy with cataplexy: Secondary | ICD-10-CM | POA: Insufficient documentation

## 2018-05-05 DIAGNOSIS — G8929 Other chronic pain: Secondary | ICD-10-CM

## 2018-05-05 DIAGNOSIS — G4733 Obstructive sleep apnea (adult) (pediatric): Secondary | ICD-10-CM | POA: Insufficient documentation

## 2018-05-05 DIAGNOSIS — M7989 Other specified soft tissue disorders: Secondary | ICD-10-CM

## 2018-05-05 DIAGNOSIS — M722 Plantar fascial fibromatosis: Secondary | ICD-10-CM

## 2018-05-05 DIAGNOSIS — F419 Anxiety disorder, unspecified: Secondary | ICD-10-CM

## 2018-05-05 DIAGNOSIS — J45909 Unspecified asthma, uncomplicated: Secondary | ICD-10-CM | POA: Insufficient documentation

## 2018-05-05 DIAGNOSIS — Z7984 Long term (current) use of oral hypoglycemic drugs: Secondary | ICD-10-CM | POA: Insufficient documentation

## 2018-05-05 DIAGNOSIS — N920 Excessive and frequent menstruation with regular cycle: Secondary | ICD-10-CM | POA: Insufficient documentation

## 2018-05-05 DIAGNOSIS — M25562 Pain in left knee: Secondary | ICD-10-CM | POA: Insufficient documentation

## 2018-05-05 MED ORDER — TRAMADOL HCL 50 MG PO TABS: 50.0000 mg | ORAL_TABLET | Freq: Three times a day (TID) | ORAL | 1 refills | Status: DC | PRN

## 2018-05-05 MED ORDER — IBUPROFEN 800 MG PO TABS: 800.0000 mg | ORAL_TABLET | Freq: Two times a day (BID) | ORAL | 1 refills | Status: DC | PRN

## 2018-05-05 MED ORDER — FUROSEMIDE 20 MG PO TABS: 20.0000 mg | ORAL_TABLET | Freq: Every day | ORAL | 0 refills | Status: DC

## 2018-05-05 MED FILL — traMADol HCL 50 MG TABS: 50 | 10 days supply | Qty: 30 | Fill #0

## 2018-05-05 MED FILL — IBUPROFEN 800 MG TABLET: 800 | 30 days supply | Qty: 60 | Fill #0

## 2018-05-05 MED FILL — FUROSEMIDE 20 MG TABLET: 20 | 14 days supply | Qty: 14 | Fill #0

## 2018-05-05 NOTE — BH Specialist Note (Signed)
Integrated Behavioral Health Initial Visit  MRN: [id number] Name: [name]  Number of Integrated Behavioral Health Clinician visits:: 1/6 Session Start time: 4:25 PM  Session End time: 5:00 PM Total time: 35 minutes  Type of Service: Integrated Behavioral Health- Individual/Family Interpretor:No. Interpretor Name and Language: N/A   Warm Hand Off Completed.       SUBJECTIVE: [name] is a 27 y.o. female accompanied by self Patient was referred by Dr. Alvis Lemmings for depression, anxiety, and ADHD. Patient reports the following symptoms/concerns: overwhelming feelings of sadness and worry, sleeping concerns, low energy, feeling bad about self, decreased concentration, restlessness, and irritability Duration of problem: Ongoing Pt reports she was diagnosed with depression at 27 yo and ADD at 6; Severity of problem: severe  OBJECTIVE: Mood: Anxious and Depressed and Affect: Appropriate Risk of harm to self or others: No plan to harm self or others  LIFE CONTEXT: Family and Social: Pt resides with mother School/Work: Pt is unemployed. She has [employer] and [place] card. Pt was denied SSI and Medicaid. Has a lawyer to assist with appeal.  Self-Care: Pt states that she rarely drinks alcohol or smoke marijuana. She participates in medication management through PCP  Life Changes: Pt has ongoing medical conditions ( I.e. Narcolepsy) that make it difficult for her to work or go to school. She reports strain in relationship with mother due to being expected to complete majority of the household duties and assisting with mother's disability.  GOALS ADDRESSED: Patient will: 1. Reduce symptoms of: anxiety and depression 2. Increase knowledge and/or ability of: coping skills and healthy habits  3. Demonstrate ability to: Increase healthy adjustment to current life circumstances and Increase adequate support systems for patient/family  INTERVENTIONS: Interventions utilized:  Motivational Interviewing, Supportive Counseling, Psychoeducation and/or Health Education and Link to [employer]  Standardized Assessments completed: GAD-7 and PHQ 2&9  ASSESSMENT: Patient currently experiencing depression and anxiety triggered by ongoing medical conditions that make it difficult to work or attend school. She reports strain in relationship with mother due to being expected to complete majority of the household duties and assisting with mother's disability. Pt reports overwhelming feelings of sadness and worry, sleeping concerns, low energy, feeling bad about self, decreased concentration, restlessness, and irritability. Denies SI/HI/AVH.    Patient may benefit from psychoeducation and psychotherapy. LCSWA educated pt on the correlation between one's mental and physical health, in addition, to how stress can negatively impact both. Pt participates in medication management through PCP; however, is interested in medication to assist in the treatment of ADD. Resources for treatment of ADD were provided, in addition, to food insecurity, transportation, and psychotherapy. Therapeutic interventions were discussed to decrease symptoms.  PLAN: 1. Follow up with behavioral health clinician on : Pt was encouraged to contact LCSWA if symptoms worsen or fail to improve to schedule behavioral appointments at [facility]. 2. Behavioral recommendations: LCSWA recommends that pt apply healthy coping skills discussed, comply with medication management, and utilize provided resources. Pt is encouraged to schedule follow up appointment with LCSWA 3. Referral(s): Integrated Art gallery manager (In Clinic) and [employer]:  Engineer, water 4. "From scale of 1-10, how likely are you to follow plan?":   Bridgett Larsson, LCSW

## 2018-05-05 NOTE — Patient Instructions (Signed)

## 2018-05-05 NOTE — Progress Notes (Signed)
Subjective:  Patient ID: [name], female    DOB: 1991-10-27  Age: 27 y.o. MRN: [id number]  CC: Knee Pain   HPI [name] is a 27 year old female with a history of morbid obesity, Obstructive sleep apnea, anemia secondary to menorrhagia ( last Hgb 12.6 in 11/2017 ; s/p Feraheme transfusion in the past), chronic migraines, narcolepsy who presents today for follow-up of left knee pain and right foot pain for which she was seen at the ED on 02/2018 where she received Vicodin which helped some. Pain is worse with weightbearing and not controlled on meloxicam with associated left leg swelling which is chronic; she was referred to orthopedics however was informed she would need to pay $100 prior to being seen and she is unable to afford this.  She currently does not take any medications. She would like to get back on ADHD medications as she has been on them in the past.  Past Medical History:  Diagnosis Date  . Acid reflux   . ADHD   . Asthma   . Clotting disorder ([place])   . Depression   . 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 Exogenous obesity   . Narcolepsy cataplexy syndrome 2007   Prior sleep studies starting on September 26 2005, AHI 5.3 per hour,  . Poor sleep hygiene     Past Surgical History:  Procedure Laterality Date  . none      Allergies  Allergen Reactions  . Banana Itching  . Food Itching    All fruits     Outpatient Medications Prior to Visit  Medication Sig Dispense Refill  . buPROPion (WELLBUTRIN XL) 300 MG 24 hr tablet Take 1 tablet (300 mg total) by mouth daily. 30 tablet 5  . acetaminophen (TYLENOL) 500 MG tablet Take 1,000 mg by mouth every 6 (six) hours as needed for mild pain.    Marland Kitchen azithromycin (ZITHROMAX) 250 MG tablet Take orally 2 tabs (500 mg) on day 1 then 1 tab (250 mg) on days 2-5 (Patient not taking: Reported on 01/27/2018) 6 tablet 0  .  benzonatate (TESSALON) 100 MG capsule Take 1 capsule (100 mg total) by mouth 2 (two) times daily as needed for cough. (Patient not taking: Reported on 01/27/2018) 20 capsule 0  . cetirizine (ZYRTEC) 10 MG tablet Take 1 tablet (10 mg total) by mouth daily. (Patient not taking: Reported on 12/29/2017) 30 tablet 11  . ferrous sulfate 325 (65 FE) MG tablet Take 1 tablet (325 mg total) by mouth 3 (three) times daily with meals. (Patient not taking: Reported on 12/29/2017) 90 tablet 5  . gabapentin (NEURONTIN) 300 MG capsule Take 1 capsule (300 mg total) by mouth 3 (three) times daily. (Patient not taking: Reported on 01/27/2018) 90 capsule 3  . HYDROcodone-acetaminophen (NORCO/VICODIN) 5-325 MG tablet Take 1 tablet by mouth every 4 (four) hours as needed. (Patient not taking: Reported on 05/05/2018) 10 tablet 0  . medroxyPROGESTERone (DEPO-PROVERA) 150 MG/ML injection Inject 150 mg into the muscle every 3 (three) months.    . metFORMIN (GLUCOPHAGE XR) 500 MG 24 hr tablet Take 1 tablet (500 mg total) by mouth daily after supper. (Patient not taking: Reported on 05/05/2018) 30 tablet 2  . naproxen (NAPROSYN) 250 MG tablet Take 250 mg by mouth daily as needed for mild pain.    Marland Kitchen nystatin cream (MYCOSTATIN) Apply to affected area 2 times daily (Patient  not taking: Reported on 11/19/2017) 30 g 0  . omeprazole (PRILOSEC) 20 MG capsule Take 1 capsule (20 mg total) by mouth daily. (Patient not taking: Reported on 05/05/2018) 30 capsule 3  . Vitamin D, Ergocalciferol, (DRISDOL) 50000 units CAPS capsule Take 1 capsule (50,000 Units total) by mouth every 7 (seven) days. (Patient not taking: Reported on 01/27/2018) 16 capsule 0  . furosemide (LASIX) 20 MG tablet Take 1 tablet (20 mg total) by mouth daily. (Patient not taking: Reported on 05/05/2018) 14 tablet 0  . ibuprofen (ADVIL,MOTRIN) 600 MG tablet Take 1 tablet (600 mg total) by mouth every 6 (six) hours as needed. (Patient not taking: Reported on 05/05/2018) 30 tablet 0  .  meloxicam (MOBIC) 7.5 MG tablet Take 1 tablet (7.5 mg total) by mouth daily. (Patient not taking: Reported on 05/05/2018) 30 tablet 1   No facility-administered medications prior to visit.     ROS Review of Systems  Constitutional: Negative for activity change, appetite change and fatigue.  HENT: Negative for congestion, sinus pressure and sore throat.   Eyes: Negative for visual disturbance.  Respiratory: Negative for cough, chest tightness, shortness of breath and wheezing.   Cardiovascular: Negative for chest pain and palpitations.  Gastrointestinal: Negative for abdominal distention, abdominal pain and constipation.  Endocrine: Negative for polydipsia.  Genitourinary: Negative for dysuria and frequency.  Musculoskeletal:       See  hpi  Skin: Negative for rash.  Neurological: Negative for tremors, light-headedness and numbness.  Hematological: Does not bruise/bleed easily.  Psychiatric/Behavioral: Negative for agitation and behavioral problems.    Objective:  BP 113/74   Pulse 89   Temp 98.7 F (37.1 C) (Oral)   Ht _0  (1.6 m)   Wt (!) 416 lb 12.8 oz (189.1 kg)   SpO2 98%   BMI 73.83 kg/m   BP/Weight 05/05/2018 02/13/2018 06/12/3661  Systolic BP 947 654 650  Diastolic BP 74 90 83  Wt. (Lbs) 416.8 421 421  BMI 73.83 74.58 74.58      Physical Exam  Constitutional: She is oriented to person, place, and time. She appears well-developed and well-nourished.  Morbidly obese  Cardiovascular: Normal rate, normal heart sounds and intact distal pulses.  No murmur heard. Pulmonary/Chest: Effort normal and breath sounds normal. She has no wheezes. She has no rales. She exhibits no tenderness.  Abdominal: Soft. Bowel sounds are normal. She exhibits no distension and no mass. There is no tenderness.  Musculoskeletal: Normal range of motion.  Tenderness on palpation of inferior aspect of left patella noninfection of left knee. No tenderness on palpation of medial or lateral joint  line Normal appearance of feet No tenderness on palpation of both feet  Neurological: She is alert and oriented to person, place, and time.  Skin: Skin is warm and dry.  Psychiatric: She has a normal mood and affect.     Assessment & Plan:   1. Chronic pain of left knee Uncontrolled on meloxicam Switch to ibuprofen Tramadol added to regimen She will need to see orthopedics for possible corticosteroid injection but however she is unable to pay co-pay required For his weight loss would be beneficial - traMADol (ULTRAM) 50 MG tablet; Take 1 tablet (50 mg total) by mouth every 8 (eight) hours as needed.  Dispense: 30 tablet; Refill: 1 - ibuprofen (ADVIL,MOTRIN) 800 MG tablet; Take 1 tablet (800 mg total) by mouth every 12 (twelve) hours as needed.  Dispense: 60 tablet; Refill: 1  2. Plantar fasciitis Uncontrolled May benefit  from corticosteroid shots but will need podiatry referral; unable to afford co-pay for specialist - traMADol (ULTRAM) 50 MG tablet; Take 1 tablet (50 mg total) by mouth every 8 (eight) hours as needed.  Dispense: 30 tablet; Refill: 1 - ibuprofen (ADVIL,MOTRIN) 800 MG tablet; Take 1 tablet (800 mg total) by mouth every 12 (twelve) hours as needed.  Dispense: 60 tablet; Refill: 1  3. Leg swelling Likely lymphedema Weight loss will be beneficial Elevate feet, low-sodium diet - CMP14+EGFR - furosemide (LASIX) 20 MG tablet; Take 1 tablet (20 mg total) by mouth daily.  Dispense: 14 tablet; Refill: 0   Meds ordered this encounter  Medications  . traMADol (ULTRAM) 50 MG tablet    Sig: Take 1 tablet (50 mg total) by mouth every 8 (eight) hours as needed.    Dispense:  30 tablet    Refill:  1  . ibuprofen (ADVIL,MOTRIN) 800 MG tablet    Sig: Take 1 tablet (800 mg total) by mouth every 12 (twelve) hours as needed.    Dispense:  60 tablet    Refill:  1  . furosemide (LASIX) 20 MG tablet    Sig: Take 1 tablet (20 mg total) by mouth daily.    Dispense:  14 tablet     Refill:  0    Follow-up: Return in about 3 months (around 08/05/2018) for Follow-up of chronic medical conditions.   Charlott Rakes MD

## 2018-05-06 LAB — CMP14+EGFR
A/G RATIO: 1.1 — AB (ref 1.2–2.2)
ALT: 26 IU/L (ref 0–32)
AST: 15 IU/L (ref 0–40)
Albumin: 3.7 g/dL (ref 3.5–5.5)
Alkaline Phosphatase: 126 IU/L — ABNORMAL HIGH (ref 39–117)
BILIRUBIN TOTAL: 0.5 mg/dL (ref 0.0–1.2)
BUN / CREAT RATIO: 18 (ref 9–23)
BUN: 11 mg/dL (ref 6–20)
CHLORIDE: 104 mmol/L (ref 96–106)
CO2: 19 mmol/L — ABNORMAL LOW (ref 20–29)
Calcium: 9.3 mg/dL (ref 8.7–10.2)
Creatinine, Ser: 0.62 mg/dL (ref 0.57–1.00)
GFR calc non Af Amer: 123 mL/min/{1.73_m2} (ref >59)
GFR, EST AFRICAN AMERICAN: 142 mL/min/{1.73_m2} (ref >59)
GLOBULIN, TOTAL: 3.4 g/dL (ref 1.5–4.5)
Glucose: 93 mg/dL (ref 65–99)
POTASSIUM: 4.5 mmol/L (ref 3.5–5.2)
Sodium: 139 mmol/L (ref 134–144)
TOTAL PROTEIN: 7.1 g/dL (ref 6.0–8.5)

## 2018-05-07 ENCOUNTER — Telehealth: Payer: Self-pay

## 2018-05-07 NOTE — Telephone Encounter (Signed)
-----   Message from Hoy Register, MD sent at 05/06/2018  8:37 AM EDT ----- Labs are stable

## 2018-05-07 NOTE — Telephone Encounter (Signed)
Patient was called and informed of lab results. 

## 2018-05-13 ENCOUNTER — Telehealth: Payer: Self-pay

## 2018-05-13 NOTE — Telephone Encounter (Signed)
Call placed to the patient and completed [facility] application. THe application was then faxed to [employer]AT eligibility

## 2018-05-22 ENCOUNTER — Telehealth: Payer: Self-pay | Admitting: Family Medicine

## 2018-05-22 NOTE — Telephone Encounter (Signed)
[facility] to [facility] and fax part B of SCAT application. Noticed that application was completed  and faxed on May 29 by Erskine SquibbJane, case Production designer, theatre/television/filmmanager. Re faxed part B of application to [employer] [phone] as well as mailed it.

## 2018-05-25 ENCOUNTER — Telehealth: Payer: Self-pay

## 2018-05-25 NOTE — Telephone Encounter (Signed)
Call received from the patient inquiring about the status of the part B application for SCAT.  Informed her that it was re-faxed to [employer]AT on 05/22/2018/

## 2018-06-03 ENCOUNTER — Telehealth: Payer: Self-pay | Admitting: Family Medicine

## 2018-06-03 MED FILL — traMADol HCL 50 MG TABS: 50 | 10 days supply | Qty: 30 | Fill #1

## 2018-06-03 NOTE — Telephone Encounter (Signed)
Will route to PCP 

## 2018-06-03 NOTE — Telephone Encounter (Signed)
Pt came in to Request a medication refill She states she has previously been prescribed a medication for her tooth ache, she would like it sent to [facility] pharmacy,please follow up

## 2018-06-04 MED ORDER — AMOXICILLIN 500 MG PO CAPS: 500.0000 mg | ORAL_CAPSULE | Freq: Three times a day (TID) | ORAL | 0 refills | Status: DC

## 2018-06-04 MED FILL — AMOXICILLIN 500 MG CAPSULE: 500 | 10 days supply | Qty: 30 | Fill #0

## 2018-06-04 NOTE — Telephone Encounter (Signed)
Done

## 2018-06-19 ENCOUNTER — Telehealth: Payer: Self-pay | Admitting: Family Medicine

## 2018-06-19 NOTE — Telephone Encounter (Signed)
Call placed to patient #[phone], regarding SCAT application. Wanted to inquire if SCAT had reached out to patient and if patient got approved for service. No answer. Left patient a message asking for a call back at [phone]. Patient can also speak with Laural BenesJane B., case manager.

## 2018-07-15 ENCOUNTER — Encounter: Payer: Self-pay | Admitting: Family Medicine

## 2018-07-15 ENCOUNTER — Ambulatory Visit: Payer: Self-pay | Attending: Family Medicine | Admitting: Family Medicine

## 2018-07-15 VITALS — BP 120/66 | HR 73 | Temp 97.5°F | Ht 63.0 in | Wt >= 6400 oz

## 2018-07-15 DIAGNOSIS — K219 Gastro-esophageal reflux disease without esophagitis: Secondary | ICD-10-CM | POA: Insufficient documentation

## 2018-07-15 DIAGNOSIS — G47419 Narcolepsy without cataplexy: Secondary | ICD-10-CM

## 2018-07-15 DIAGNOSIS — N920 Excessive and frequent menstruation with regular cycle: Secondary | ICD-10-CM | POA: Insufficient documentation

## 2018-07-15 DIAGNOSIS — G43709 Chronic migraine without aura, not intractable, without status migrainosus: Secondary | ICD-10-CM | POA: Insufficient documentation

## 2018-07-15 DIAGNOSIS — F909 Attention-deficit hyperactivity disorder, unspecified type: Secondary | ICD-10-CM | POA: Insufficient documentation

## 2018-07-15 DIAGNOSIS — Z791 Long term (current) use of non-steroidal anti-inflammatories (NSAID): Secondary | ICD-10-CM | POA: Insufficient documentation

## 2018-07-15 DIAGNOSIS — Z79899 Other long term (current) drug therapy: Secondary | ICD-10-CM | POA: Insufficient documentation

## 2018-07-15 DIAGNOSIS — Z6841 Body Mass Index (BMI) 40.0 and over, adult: Secondary | ICD-10-CM | POA: Insufficient documentation

## 2018-07-15 DIAGNOSIS — Z91018 Allergy to other foods: Secondary | ICD-10-CM | POA: Insufficient documentation

## 2018-07-15 DIAGNOSIS — G47411 Narcolepsy with cataplexy: Secondary | ICD-10-CM | POA: Insufficient documentation

## 2018-07-15 DIAGNOSIS — F329 Major depressive disorder, single episode, unspecified: Secondary | ICD-10-CM | POA: Insufficient documentation

## 2018-07-15 DIAGNOSIS — N926 Irregular menstruation, unspecified: Secondary | ICD-10-CM

## 2018-07-15 DIAGNOSIS — M25562 Pain in left knee: Secondary | ICD-10-CM | POA: Insufficient documentation

## 2018-07-15 DIAGNOSIS — J45909 Unspecified asthma, uncomplicated: Secondary | ICD-10-CM | POA: Insufficient documentation

## 2018-07-15 DIAGNOSIS — Z79891 Long term (current) use of opiate analgesic: Secondary | ICD-10-CM | POA: Insufficient documentation

## 2018-07-15 DIAGNOSIS — G4733 Obstructive sleep apnea (adult) (pediatric): Secondary | ICD-10-CM | POA: Insufficient documentation

## 2018-07-15 DIAGNOSIS — G8929 Other chronic pain: Secondary | ICD-10-CM | POA: Insufficient documentation

## 2018-07-15 DIAGNOSIS — Z7984 Long term (current) use of oral hypoglycemic drugs: Secondary | ICD-10-CM | POA: Insufficient documentation

## 2018-07-15 MED ORDER — IBUPROFEN 800 MG PO TABS: 800.0000 mg | ORAL_TABLET | Freq: Two times a day (BID) | ORAL | 1 refills | Status: DC | PRN

## 2018-07-15 MED ORDER — TOPIRAMATE 50 MG PO TABS: 50.0000 mg | ORAL_TABLET | Freq: Two times a day (BID) | ORAL | 3 refills | Status: DC

## 2018-07-15 MED ORDER — TRAMADOL HCL 50 MG PO TABS: 50.0000 mg | ORAL_TABLET | Freq: Three times a day (TID) | ORAL | 1 refills | Status: DC | PRN

## 2018-07-15 MED FILL — IBUPROFEN 800 MG TABLET: 800 | 30 days supply | Qty: 60 | Fill #0

## 2018-07-15 MED FILL — traMADol HCL 50 MG TABS: 50 | 10 days supply | Qty: 30 | Fill #0

## 2018-07-15 MED FILL — TOPIRAMATE 50 MG TABLET: 50 | 30 days supply | Qty: 60 | Fill #0

## 2018-07-15 NOTE — Progress Notes (Signed)
Subjective:  Patient ID: [name], female    DOB: 03-27-1991  Age: 27 y.o. MRN: [id number]  CC: Asthma   HPI [name]   is a 27 year old female with a history of morbid obesity, Obstructive sleep apnea, anemia secondary to menorrhagia who presents today for a follow-up visit. She has chronic left knee pain for which she takes ibuprofen and tramadol as she has been unable to see orthopedics due to her inability to afford the co-pay required. She also suffers from chronic  migraines with minimal relief provided by ibuprofen with associated photophobia but no nausea and vomiting. She currently does not exercise and is not very active but spends most of her time indoors. With regards to her menstrual periods they regular with the last one occurring last month but she denies recent menorrhagia, lightheadedness.  Past Medical History:  Diagnosis Date  . Acid reflux   . ADHD   . Asthma   . Clotting disorder ([facility])   . Depression   . 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 Exogenous obesity   . Narcolepsy cataplexy syndrome 2007   Prior sleep studies starting on September 26 2005, AHI 5.3 per hour,  . Poor sleep hygiene     Past Surgical History:  Procedure Laterality Date  . none      Allergies  Allergen Reactions  . Banana Itching  . Food Itching    All fruits     Outpatient Medications Prior to Visit  Medication Sig Dispense Refill  . buPROPion (WELLBUTRIN XL) 300 MG 24 hr tablet Take 1 tablet (300 mg total) by mouth daily. 30 tablet 5  . metFORMIN (GLUCOPHAGE XR) 500 MG 24 hr tablet Take 1 tablet (500 mg total) by mouth daily after supper. 30 tablet 2  . omeprazole (PRILOSEC) 20 MG capsule Take 1 capsule (20 mg total) by mouth daily. 30 capsule 3  . traMADol (ULTRAM) 50 MG tablet Take 1 tablet (50 mg total) by mouth every 8 (eight) hours as needed. 30 tablet 1    . acetaminophen (TYLENOL) 500 MG tablet Take 1,000 mg by mouth every 6 (six) hours as needed for mild pain.    Marland Kitchen. amoxicillin (AMOXIL) 500 MG capsule Take 1 capsule (500 mg total) by mouth 3 (three) times daily. (Patient not taking: Reported on 07/15/2018) 30 capsule 0  . azithromycin (ZITHROMAX) 250 MG tablet Take orally 2 tabs (500 mg) on day 1 then 1 tab (250 mg) on days 2-5 (Patient not taking: Reported on 01/27/2018) 6 tablet 0  . benzonatate (TESSALON) 100 MG capsule Take 1 capsule (100 mg total) by mouth 2 (two) times daily as needed for cough. (Patient not taking: Reported on 01/27/2018) 20 capsule 0  . cetirizine (ZYRTEC) 10 MG tablet Take 1 tablet (10 mg total) by mouth daily. (Patient not taking: Reported on 12/29/2017) 30 tablet 11  . ferrous sulfate 325 (65 FE) MG tablet Take 1 tablet (325 mg total) by mouth 3 (three) times daily with meals. (Patient not taking: Reported on 12/29/2017) 90 tablet 5  . furosemide (LASIX) 20 MG tablet Take 1 tablet (20 mg total) by mouth daily. (Patient not taking: Reported on 07/15/2018) 14 tablet 0  . gabapentin (NEURONTIN) 300 MG capsule Take 1 capsule (300 mg total) by mouth 3 (three) times daily. (Patient not taking: Reported on 01/27/2018) 90 capsule 3  . HYDROcodone-acetaminophen (NORCO/VICODIN) 5-325 MG  tablet Take 1 tablet by mouth every 4 (four) hours as needed. (Patient not taking: Reported on 05/05/2018) 10 tablet 0  . medroxyPROGESTERone (DEPO-PROVERA) 150 MG/ML injection Inject 150 mg into the muscle every 3 (three) months.    . naproxen (NAPROSYN) 250 MG tablet Take 250 mg by mouth daily as needed for mild pain.    Marland Kitchen nystatin cream (MYCOSTATIN) Apply to affected area 2 times daily (Patient not taking: Reported on 11/19/2017) 30 g 0  . Vitamin D, Ergocalciferol, (DRISDOL) 50000 units CAPS capsule Take 1 capsule (50,000 Units total) by mouth every 7 (seven) days. (Patient not taking: Reported on 01/27/2018) 16 capsule 0  . ibuprofen (ADVIL,MOTRIN) 800 MG  tablet Take 1 tablet (800 mg total) by mouth every 12 (twelve) hours as needed. (Patient not taking: Reported on 07/15/2018) 60 tablet 1   No facility-administered medications prior to visit.     ROS Review of Systems  Constitutional: Negative for activity change, appetite change and fatigue.  HENT: Negative for congestion, sinus pressure and sore throat.   Eyes: Negative for visual disturbance.  Respiratory: Negative for cough, chest tightness, shortness of breath and wheezing.   Cardiovascular: Negative for chest pain and palpitations.  Gastrointestinal: Negative for abdominal distention, abdominal pain and constipation.  Endocrine: Negative for polydipsia.  Genitourinary: Negative for dysuria and frequency.  Musculoskeletal:       See hpi  Skin: Negative for rash.  Neurological: Negative for tremors, light-headedness and numbness.  Hematological: Does not bruise/bleed easily.  Psychiatric/Behavioral: Negative for agitation and behavioral problems.    Objective:  BP 120/66   Pulse 73   Temp (!) 97.5 F (36.4 C) (Oral)   Ht 5\' 3"  (1.6 m)   Wt (!) 420 lb 12.8 oz (190.9 kg)   SpO2 99%   BMI 74.54 kg/m   BP/Weight 07/15/2018 05/05/2018 02/13/2018  Systolic BP 120 113 110  Diastolic BP 66 74 90  Wt. (Lbs) 420.8 416.8 421  BMI 74.54 73.83 74.58      Physical Exam  Constitutional: She is oriented to person, place, and time. She appears well-developed and well-nourished.  Morbidly obese  Cardiovascular: Normal rate, normal heart sounds and intact distal pulses.  No murmur heard. Pulmonary/Chest: Effort normal and breath sounds normal. She has no wheezes. She has no rales. She exhibits no tenderness.  Abdominal: Soft. Bowel sounds are normal. She exhibits no distension and no mass. There is no tenderness.  Musculoskeletal: Normal range of motion.  Normal appearance of knees bilaterally, no tenderness elicited  Neurological: She is alert and oriented to person, place, and time.   Skin: Skin is warm and dry.  Psychiatric: She has a normal mood and affect.     Assessment & Plan:   1. Chronic pain of left knee Uncontrolled Needs to see orthopedics but is unable to afford co-pay Advised to work on weight loss - traMADol (ULTRAM) 50 MG tablet; Take 1 tablet (50 mg total) by mouth every 8 (eight) hours as needed.  Dispense: 30 tablet; Refill: 1 - ibuprofen (ADVIL,MOTRIN) 800 MG tablet; Take 1 tablet (800 mg total) by mouth every 12 (twelve) hours as needed.  Dispense: 60 tablet; Refill: 1 - Drug Screen 12+Alcohol+CRT, Ur  2. Chronic migraine without aura without status migrainosus, not intractable Uncontrolled Commenced on Topamax which will also help with weight loss - topiramate (TOPAMAX) 50 MG tablet; Take 1 tablet (50 mg total) by mouth 2 (two) times daily.  Dispense: 60 tablet; Refill: 3  3. Narcolepsy  without cataplexy   4. Morbid obesity with body mass index of 70 and over in adult [facility]) Discussed weight loss modalities including commencing a walking regimen of 30 minutes/day as she is currently not involved in any form of exercise Reduce portion sizes, avoid late meals  5. Irregular periods Previous history of anemia - CBC with Differential/Platelet   Meds ordered this encounter  Medications  . traMADol (ULTRAM) 50 MG tablet    Sig: Take 1 tablet (50 mg total) by mouth every 8 (eight) hours as needed.    Dispense:  30 tablet    Refill:  1  . ibuprofen (ADVIL,MOTRIN) 800 MG tablet    Sig: Take 1 tablet (800 mg total) by mouth every 12 (twelve) hours as needed.    Dispense:  60 tablet    Refill:  1  . topiramate (TOPAMAX) 50 MG tablet    Sig: Take 1 tablet (50 mg total) by mouth 2 (two) times daily.    Dispense:  60 tablet    Refill:  3    Follow-up: Return in about 3 months (around 10/15/2018) for Follow-up of chronic medical conditions.   Hoy Register MD

## 2018-07-16 LAB — CBC WITH DIFFERENTIAL/PLATELET
Basophils Absolute: 0 10*3/uL (ref 0.0–0.2)
Basos: 0 %
EOS (ABSOLUTE): 0.1 10*3/uL (ref 0.0–0.4)
EOS: 2 %
HEMATOCRIT: 34.4 % (ref 34.0–46.6)
Hemoglobin: 10.4 g/dL — ABNORMAL LOW (ref 11.1–15.9)
IMMATURE GRANULOCYTES: 0 %
Immature Grans (Abs): 0 10*3/uL (ref 0.0–0.1)
LYMPHS: 36 %
Lymphocytes Absolute: 2.6 10*3/uL (ref 0.7–3.1)
MCH: 21.4 pg — ABNORMAL LOW (ref 26.6–33.0)
MCHC: 30.2 g/dL — ABNORMAL LOW (ref 31.5–35.7)
MCV: 71 fL — ABNORMAL LOW (ref 79–97)
MONOS ABS: 1 10*3/uL — AB (ref 0.1–0.9)
Monocytes: 13 %
NEUTROS PCT: 49 %
Neutrophils Absolute: 3.5 10*3/uL (ref 1.4–7.0)
PLATELETS: 370 10*3/uL (ref 150–450)
RBC: 4.85 x10E6/uL (ref 3.77–5.28)
RDW: 16 % — ABNORMAL HIGH (ref 12.3–15.4)
WBC: 7.3 10*3/uL (ref 3.4–10.8)

## 2018-07-19 LAB — DRUG SCREEN 12+ALCOHOL+CRT, UR
Amphetamines, Urine: NEGATIVE ng/mL
BARBITURATE: NEGATIVE ng/mL
BENZODIAZ UR QL: NEGATIVE ng/mL
Cannabinoids: NEGATIVE ng/mL
Cocaine (Metabolite): NEGATIVE ng/mL
Creatinine, Urine: 274.2 mg/dL (ref 20.0–300.0)
Ethanol, Urine: NEGATIVE %
MEPERIDINE: NEGATIVE ng/mL
Methadone: NEGATIVE ng/mL
OPIATE SCREEN URINE: NEGATIVE ng/mL
Oxycodone/Oxymorphone, Urine: NEGATIVE ng/mL
PROPOXYPHENE: NEGATIVE ng/mL
Phencyclidine: NEGATIVE ng/mL
TRAMADOL: POSITIVE — AB

## 2018-07-20 ENCOUNTER — Telehealth: Payer: Self-pay | Admitting: Family Medicine

## 2018-07-20 ENCOUNTER — Telehealth: Payer: Self-pay

## 2018-07-20 NOTE — Telephone Encounter (Signed)
-----   Message from Hoy RegisterEnobong Newlin, MD sent at 07/20/2018  8:49 AM EDT ----- Urine drug screen reveals presence of tramadol as expected.

## 2018-07-20 NOTE — Telephone Encounter (Signed)
Patient returned the nurse call and was informed of the results, patient had no questions.

## 2018-07-20 NOTE — Telephone Encounter (Signed)
Patient was called and informed to contact ofiice for lab results.   If patient returns phone call please inform patient of lab results below.

## 2018-08-05 ENCOUNTER — Ambulatory Visit: Payer: Medicaid Other | Admitting: Family Medicine

## 2018-08-11 ENCOUNTER — Telehealth: Payer: Self-pay

## 2018-08-11 NOTE — Telephone Encounter (Signed)
[facility] inquiring if there is any additional information about the patient's knee pain. She said that the patient said that she needs to see ortho but does not have any insurance. Informed Toni AmendCourtney that there no no additional diagnosis at this time

## 2018-09-15 ENCOUNTER — Telehealth: Payer: Self-pay | Admitting: Family Medicine

## 2018-09-15 ENCOUNTER — Ambulatory Visit: Payer: Self-pay | Attending: Family Medicine | Admitting: Family Medicine

## 2018-09-15 ENCOUNTER — Encounter: Payer: Self-pay | Admitting: Family Medicine

## 2018-09-15 VITALS — BP 107/72 | HR 74 | Temp 98.3°F | Ht 63.0 in | Wt >= 6400 oz

## 2018-09-15 DIAGNOSIS — M25562 Pain in left knee: Secondary | ICD-10-CM | POA: Insufficient documentation

## 2018-09-15 DIAGNOSIS — G8929 Other chronic pain: Secondary | ICD-10-CM | POA: Insufficient documentation

## 2018-09-15 DIAGNOSIS — Z0000 Encounter for general adult medical examination without abnormal findings: Secondary | ICD-10-CM

## 2018-09-15 DIAGNOSIS — Z79899 Other long term (current) drug therapy: Secondary | ICD-10-CM | POA: Insufficient documentation

## 2018-09-15 DIAGNOSIS — G4733 Obstructive sleep apnea (adult) (pediatric): Secondary | ICD-10-CM | POA: Insufficient documentation

## 2018-09-15 DIAGNOSIS — R42 Dizziness and giddiness: Secondary | ICD-10-CM | POA: Insufficient documentation

## 2018-09-15 DIAGNOSIS — K219 Gastro-esophageal reflux disease without esophagitis: Secondary | ICD-10-CM | POA: Insufficient documentation

## 2018-09-15 DIAGNOSIS — J45909 Unspecified asthma, uncomplicated: Secondary | ICD-10-CM | POA: Insufficient documentation

## 2018-09-15 DIAGNOSIS — Z7984 Long term (current) use of oral hypoglycemic drugs: Secondary | ICD-10-CM | POA: Insufficient documentation

## 2018-09-15 DIAGNOSIS — G43709 Chronic migraine without aura, not intractable, without status migrainosus: Secondary | ICD-10-CM | POA: Insufficient documentation

## 2018-09-15 DIAGNOSIS — F909 Attention-deficit hyperactivity disorder, unspecified type: Secondary | ICD-10-CM | POA: Insufficient documentation

## 2018-09-15 DIAGNOSIS — R21 Rash and other nonspecific skin eruption: Secondary | ICD-10-CM | POA: Insufficient documentation

## 2018-09-15 DIAGNOSIS — Z6841 Body Mass Index (BMI) 40.0 and over, adult: Secondary | ICD-10-CM | POA: Insufficient documentation

## 2018-09-15 DIAGNOSIS — F329 Major depressive disorder, single episode, unspecified: Secondary | ICD-10-CM | POA: Insufficient documentation

## 2018-09-15 DIAGNOSIS — D649 Anemia, unspecified: Secondary | ICD-10-CM | POA: Insufficient documentation

## 2018-09-15 MED ORDER — TOPIRAMATE 50 MG PO TABS: 50.0000 mg | ORAL_TABLET | Freq: Two times a day (BID) | ORAL | 3 refills | Status: DC

## 2018-09-15 MED ORDER — IBUPROFEN 800 MG PO TABS: 800.0000 mg | ORAL_TABLET | Freq: Two times a day (BID) | ORAL | 1 refills | Status: DC | PRN

## 2018-09-15 MED ORDER — OMEPRAZOLE 20 MG PO CPDR: 20.0000 mg | DELAYED_RELEASE_CAPSULE | Freq: Every day | ORAL | 3 refills | Status: DC

## 2018-09-15 MED ORDER — CLOTRIMAZOLE 1 % EX CREA: 1.0000 "application " | TOPICAL_CREAM | Freq: Two times a day (BID) | CUTANEOUS | 1 refills | Status: DC

## 2018-09-15 MED FILL — ?OMEPRAZOLE DR 20MG CAPSULE: 20 | 30 days supply | Qty: 30 | Fill #0

## 2018-09-15 MED FILL — IBUPROFEN 800 MG TABLET: 800 | 30 days supply | Qty: 60 | Fill #0

## 2018-09-15 MED FILL — TOPIRAMATE 50 MG TABLET: 50 | 30 days supply | Qty: 60 | Fill #0

## 2018-09-15 NOTE — Telephone Encounter (Signed)
After the visit pt came back into the office to request her medication  -omeprazole (PRILOSEC) 20 MG capsule  To [facility] pharmacy, please follow up

## 2018-09-15 NOTE — Telephone Encounter (Signed)
Medication has been refilled and sent to onsite pharmacy. 

## 2018-09-15 NOTE — Patient Instructions (Signed)

## 2018-09-15 NOTE — Progress Notes (Signed)
Patient has rash under left arm.

## 2018-09-15 NOTE — Progress Notes (Addendum)
Subjective:  Patient ID: [name], female    DOB: 08-19-1991  Age: 27 y.o. MRN: [id number]  CC: Abdominal Pain and Dizziness   HPI [name]  is a 27 year old female with a history of morbid obesity, Obstructive sleep apnea, anemia secondary to menorrhagia who presents today for a follow-up visit. She has had intermittent headaches, nausea and abdominal pain which is absent at this time and reports headache is improved when she took her Topamax which she admits to not taking as prescribed but only on an as-needed basis.   Yesterday she noticed lightheadedness when she attempted to stand up from a sitting position but symptoms resolved after she laid down.  She denies dizziness at this time and endorses chronic postnasal drip but has not been compliant with Zyrtec.  She has a history of chronic anemia with last hemoglobin of 10.4 in 06/2018 She has a rash which has been present for the last few days in her left axilla which she describes as burning especially when the skin of her upper arm rubs against the rash Her knee pain is rated as an 8/10 at this time as she is yet to take analgesics today.  Past Medical History:  Diagnosis Date  . Acid reflux   . ADHD   . Asthma   . Clotting disorder (HCC)   . Depression   . 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 Exogenous obesity   . Narcolepsy cataplexy syndrome 2007   Prior sleep studies starting on September 26 2005, AHI 5.3 per hour,  . Poor sleep hygiene     Past Surgical History:  Procedure Laterality Date  . none      Allergies  Allergen Reactions  . Banana Itching  . Food Itching    All fruits     Outpatient Medications Prior to Visit  Medication Sig Dispense Refill  . acetaminophen (TYLENOL) 500 MG tablet Take 1,000 mg by mouth every 6 (six) hours as needed for mild pain.    Marland Kitchen buPROPion (WELLBUTRIN XL) 300 MG 24  hr tablet Take 1 tablet (300 mg total) by mouth daily. 30 tablet 5  . metFORMIN (GLUCOPHAGE XR) 500 MG 24 hr tablet Take 1 tablet (500 mg total) by mouth daily after supper. 30 tablet 2  . omeprazole (PRILOSEC) 20 MG capsule Take 1 capsule (20 mg total) by mouth daily. 30 capsule 3  . traMADol (ULTRAM) 50 MG tablet Take 1 tablet (50 mg total) by mouth every 8 (eight) hours as needed. 30 tablet 1  . ibuprofen (ADVIL,MOTRIN) 800 MG tablet Take 1 tablet (800 mg total) by mouth every 12 (twelve) hours as needed. 60 tablet 1  . topiramate (TOPAMAX) 50 MG tablet Take 1 tablet (50 mg total) by mouth 2 (two) times daily. 60 tablet 3  . cetirizine (ZYRTEC) 10 MG tablet Take 1 tablet (10 mg total) by mouth daily. (Patient not taking: Reported on 12/29/2017) 30 tablet 11  . ferrous sulfate 325 (65 FE) MG tablet Take 1 tablet (325 mg total) by mouth 3 (three) times daily with meals. (Patient not taking: Reported on 12/29/2017) 90 tablet 5  . gabapentin (NEURONTIN) 300 MG capsule Take 1 capsule (300 mg total) by mouth 3 (three) times daily. (Patient not taking: Reported on 01/27/2018) 90 capsule 3  . HYDROcodone-acetaminophen (NORCO/VICODIN) 5-325 MG tablet Take 1 tablet by mouth every 4 (four) hours as  needed. (Patient not taking: Reported on 05/05/2018) 10 tablet 0  . naproxen (NAPROSYN) 250 MG tablet Take 250 mg by mouth daily as needed for mild pain.    Marland Kitchen nystatin cream (MYCOSTATIN) Apply to affected area 2 times daily (Patient not taking: Reported on 11/19/2017) 30 g 0  . Vitamin D, Ergocalciferol, (DRISDOL) 50000 units CAPS capsule Take 1 capsule (50,000 Units total) by mouth every 7 (seven) days. (Patient not taking: Reported on 01/27/2018) 16 capsule 0  . amoxicillin (AMOXIL) 500 MG capsule Take 1 capsule (500 mg total) by mouth 3 (three) times daily. (Patient not taking: Reported on 07/15/2018) 30 capsule 0  . azithromycin (ZITHROMAX) 250 MG tablet Take orally 2 tabs (500 mg) on day 1 then 1 tab (250 mg) on days  2-5 (Patient not taking: Reported on 01/27/2018) 6 tablet 0  . benzonatate (TESSALON) 100 MG capsule Take 1 capsule (100 mg total) by mouth 2 (two) times daily as needed for cough. (Patient not taking: Reported on 01/27/2018) 20 capsule 0  . furosemide (LASIX) 20 MG tablet Take 1 tablet (20 mg total) by mouth daily. (Patient not taking: Reported on 07/15/2018) 14 tablet 0  . medroxyPROGESTERone (DEPO-PROVERA) 150 MG/ML injection Inject 150 mg into the muscle every 3 (three) months.     No facility-administered medications prior to visit.     ROS Review of Systems  Objective:  BP 107/72   Pulse 74   Temp 98.3 F (36.8 C) (Oral)   Ht 5\' 3"  (1.6 m)   Wt (!) 417 lb 9.6 oz (189.4 kg)   SpO2 98%   BMI 73.97 kg/m   BP/Weight 09/15/2018 07/15/2018 05/05/2018  Systolic BP 107 120 113  Diastolic BP 72 66 74  Wt. (Lbs) 417.6 420.8 416.8  BMI 73.97 74.54 73.83      Physical Exam  Constitutional: She is oriented to person, place, and time. She appears well-developed and well-nourished.  Cardiovascular: Normal rate, normal heart sounds and intact distal pulses.  No murmur heard. Pulmonary/Chest: Effort normal and breath sounds normal. She has no wheezes. She has no rales. She exhibits no tenderness.  Abdominal: Soft. Bowel sounds are normal. She exhibits no distension and no mass. There is no tenderness.  Musculoskeletal: Normal range of motion.  Neurological: She is alert and oriented to person, place, and time.  Skin:  Slightly erythematous well-circumscribed rash in left axilla, no vesicles  Psychiatric: She has a normal mood and affect.     CBC    Component Value Date/Time   WBC 7.3 07/15/2018 1630   WBC 5.3 07/01/2017 1838   RBC 4.85 07/15/2018 1630   RBC 4.81 07/01/2017 1838   HGB 10.4 (L) 07/15/2018 1630   HCT 34.4 07/15/2018 1630   PLT 370 07/15/2018 1630   MCV 71 (L) 07/15/2018 1630   MCH 21.4 (L) 07/15/2018 1630   MCH 21.6 (L) 07/01/2017 1838   MCHC 30.2 (L) 07/15/2018  1630   MCHC 31.4 07/01/2017 1838   RDW 16.0 (H) 07/15/2018 1630   LYMPHSABS 2.6 07/15/2018 1630   MONOABS 0.6 12/30/2016 0053   EOSABS 0.1 07/15/2018 1630   BASOSABS 0.0 07/15/2018 1630    Assessment & Plan:   1. Chronic pain of left knee Pain is uncontrolled at this time due to the fact that she is yet to take her medications today Advised that weight loss will beneficial Unable to see orthopedics due to lack of medical coverage - ibuprofen (ADVIL,MOTRIN) 800 MG tablet; Take 1 tablet (800  mg total) by mouth every 12 (twelve) hours as needed.  Dispense: 60 tablet; Refill: 1  2. Chronic migraine without aura without status migrainosus, not intractable Uncontrolled due to noncompliance with Topamax Encouraged to take Topamax for migraine prophylaxis rather than on an as-needed basis We discussed the need for contraception given risk of teratogenicity with Topamax and she declines that she is currently not sexually active - topiramate (TOPAMAX) 50 MG tablet; Take 1 tablet (50 mg total) by mouth 2 (two) times daily.  Dispense: 60 tablet; Refill: 3  3. Rash - clotrimazole (LOTRIMIN) 1 % cream; Apply 1 application topically 2 (two) times daily.  Dispense: 30 g; Refill: 1  4. Healthcare maintenance - Ambulatory referral to Gynecology  5. Dizziness Symptoms absent at this time Could be sinus related versus hypotension due to dehydration Advised to increase fluid intake Use Zyrtec   Meds ordered this encounter  Medications  . topiramate (TOPAMAX) 50 MG tablet    Sig: Take 1 tablet (50 mg total) by mouth 2 (two) times daily.    Dispense:  60 tablet    Refill:  3  . ibuprofen (ADVIL,MOTRIN) 800 MG tablet    Sig: Take 1 tablet (800 mg total) by mouth every 12 (twelve) hours as needed.    Dispense:  60 tablet    Refill:  1  . clotrimazole (LOTRIMIN) 1 % cream    Sig: Apply 1 application topically 2 (two) times daily.    Dispense:  30 g    Refill:  1    Follow-up: Return in  about 3 months (around 12/16/2018) for Follow-up of chronic medical conditions.   Hoy Register MD

## 2018-10-14 ENCOUNTER — Encounter: Payer: Self-pay | Admitting: Family Medicine

## 2018-10-20 ENCOUNTER — Ambulatory Visit: Payer: Medicaid Other | Admitting: Family Medicine

## 2018-11-17 ENCOUNTER — Encounter: Payer: Medicaid Other | Admitting: Student

## 2018-12-21 ENCOUNTER — Ambulatory Visit: Payer: Medicaid Other | Admitting: Family Medicine

## 2019-02-16 ENCOUNTER — Telehealth: Payer: Self-pay | Admitting: Family Medicine

## 2019-02-16 DIAGNOSIS — M25562 Pain in left knee: Principal | ICD-10-CM

## 2019-02-16 DIAGNOSIS — G8929 Other chronic pain: Secondary | ICD-10-CM

## 2019-02-16 NOTE — Telephone Encounter (Signed)
1) Medication(s) Requested (by name): omeprazole (PRILOSEC) 20 MG capsule [[id number]]   2) Pharmacy of Choice:  3) Special Requests:   Approved medications will be sent to the pharmacy, we will reach out if there is an issue.  Requests made after 3pm may not be addressed until the following business day!  If a patient is unsure of the name of the medication(s) please note and ask patient to call back when they are able to provide all info, do not send to responsible party until all information is available!

## 2019-02-16 NOTE — Telephone Encounter (Signed)
Please speak to pt

## 2019-02-16 NOTE — Telephone Encounter (Signed)
The patient was transferred to me by [place] at the front desk. She states she is out of town and needs her omeprazole, topiramate and tramadol refilled. I looked at the pharmacy software and she has not picked up her medications since she got a one month supply on 09/15/18.  I gave her instructions to transfer the omeprazole and topiramate to a pharmacy local to where she is at and let her know that the refill on tramadol would be passed along to Dr. Alvis Lemmings. If the medication is approved she would like it sent to [facility] in [place], [place] [phone]  Pt last seen: 09/15/18 Next appt: n/a Last RX written on: 07/15/18 Date of original fill: 07/15/18 Date of refill(s): 1 refill given but RX expired   No other controlled substances were filled during this time, please refill if appropriate.

## 2019-02-17 MED ORDER — TRAMADOL HCL 50 MG PO TABS: 50.0000 mg | ORAL_TABLET | Freq: Every evening | ORAL | 0 refills | Status: DC | PRN

## 2019-02-17 NOTE — Telephone Encounter (Signed)
Done

## 2019-03-04 ENCOUNTER — Telehealth: Payer: Self-pay | Admitting: Family Medicine

## 2019-03-04 NOTE — Telephone Encounter (Signed)
Patient called because she has a toothache and would like antibiotics called in for her. Please follow.

## 2019-03-04 NOTE — Telephone Encounter (Signed)
Will route to PCP for review. 

## 2019-03-09 MED ORDER — AMOXICILLIN 500 MG PO CAPS: 500.0000 mg | ORAL_CAPSULE | Freq: Three times a day (TID) | ORAL | 0 refills | Status: DC

## 2019-03-09 NOTE — Telephone Encounter (Signed)
Will route to PCP for review. 

## 2019-03-09 NOTE — Telephone Encounter (Signed)
Amoxicillin called into the pharmacy.

## 2019-03-10 MED FILL — AMOXICILLIN 500 MG CAPSULE: 500 | 10 days supply | Qty: 30 | Fill #0

## 2019-03-10 NOTE — Telephone Encounter (Signed)
Patient was called and a voicemail was left informing patient to return phone call. 

## 2019-03-12 NOTE — Telephone Encounter (Signed)
Pt called in to verify the address that she wants her meds delivered to  [address] [place] [address]

## 2019-03-15 NOTE — Telephone Encounter (Signed)
Pharmacy notified.

## 2019-03-29 MED FILL — OMEPRAZOLE 20 MG CAP: 20 | 60 days supply | Qty: 60 | Fill #0

## 2019-04-01 ENCOUNTER — Other Ambulatory Visit: Payer: Self-pay | Admitting: Family Medicine

## 2019-04-01 DIAGNOSIS — K219 Gastro-esophageal reflux disease without esophagitis: Secondary | ICD-10-CM

## 2019-04-05 ENCOUNTER — Telehealth: Payer: Self-pay | Admitting: Family Medicine

## 2019-04-05 NOTE — Telephone Encounter (Signed)
Patient called because she would like to know if she can take amoxicillin with nyquil. Please follow up.

## 2019-04-05 NOTE — Telephone Encounter (Signed)
Patients call returned.  Patient identified by name and date of birth.  Patient wanted to know if she could take Nyquil with amoxicillin.  Patient was advised that she could take the two medicines but to not take more than the prescribed amount.  Patient acknowledged understanding of advice.

## 2019-04-13 ENCOUNTER — Telehealth: Payer: Self-pay | Admitting: Family Medicine

## 2019-04-13 NOTE — Telephone Encounter (Signed)
New Message   Pt is calling, states she is needing an Antibiotic for a personal matter. Please f/u

## 2019-04-14 NOTE — Telephone Encounter (Signed)
Patient was called and a voicemail was left informing patient to return call for more information on the need of the antibiotic.

## 2019-04-15 ENCOUNTER — Telehealth: Payer: Self-pay | Admitting: Family Medicine

## 2019-04-15 NOTE — Telephone Encounter (Signed)
Patient called requesting to speak to nurse, patient did not specify why,  please follow up

## 2019-04-19 NOTE — Telephone Encounter (Signed)
Patient was called and a voicemail was left informing patient to return phone call. 

## 2019-06-02 MED FILL — ?OMEPRAZOLE 20 MG CAPSULE D: 20 | 90 days supply | Qty: 90 | Fill #0

## 2019-06-29 ENCOUNTER — Telehealth: Payer: Self-pay | Admitting: Family Medicine

## 2019-06-29 NOTE — Telephone Encounter (Signed)
Patient called stating that they would like to be prescribed something for allergies. Please follow up.

## 2019-06-30 ENCOUNTER — Other Ambulatory Visit: Payer: Self-pay

## 2019-06-30 DIAGNOSIS — J302 Other seasonal allergic rhinitis: Secondary | ICD-10-CM

## 2019-06-30 MED ORDER — CETIRIZINE HCL 10 MG PO TABS: 10.0000 mg | ORAL_TABLET | Freq: Every day | ORAL | 11 refills | Status: DC

## 2019-06-30 MED FILL — ?CETIRIZINE HCL 10 MG TABLE: 10 | 30 days supply | Qty: 30 | Fill #0

## 2019-06-30 NOTE — Progress Notes (Unsigned)
Patient Zyrtec on file refilled and sent to onsite pharmacy.

## 2019-06-30 NOTE — Telephone Encounter (Signed)
Zyrtec has been refilled and sent to onsite pharmacy.

## 2019-08-09 IMAGING — CR DG FOOT COMPLETE 3+V*R*
3 series · 3 of 3 positions shown · non-contrast
Comparison: 08/21/2006

CLINICAL DATA: Foot pain

EXAM:
RIGHT FOOT COMPLETE - 3+ VIEW

[foot ap]
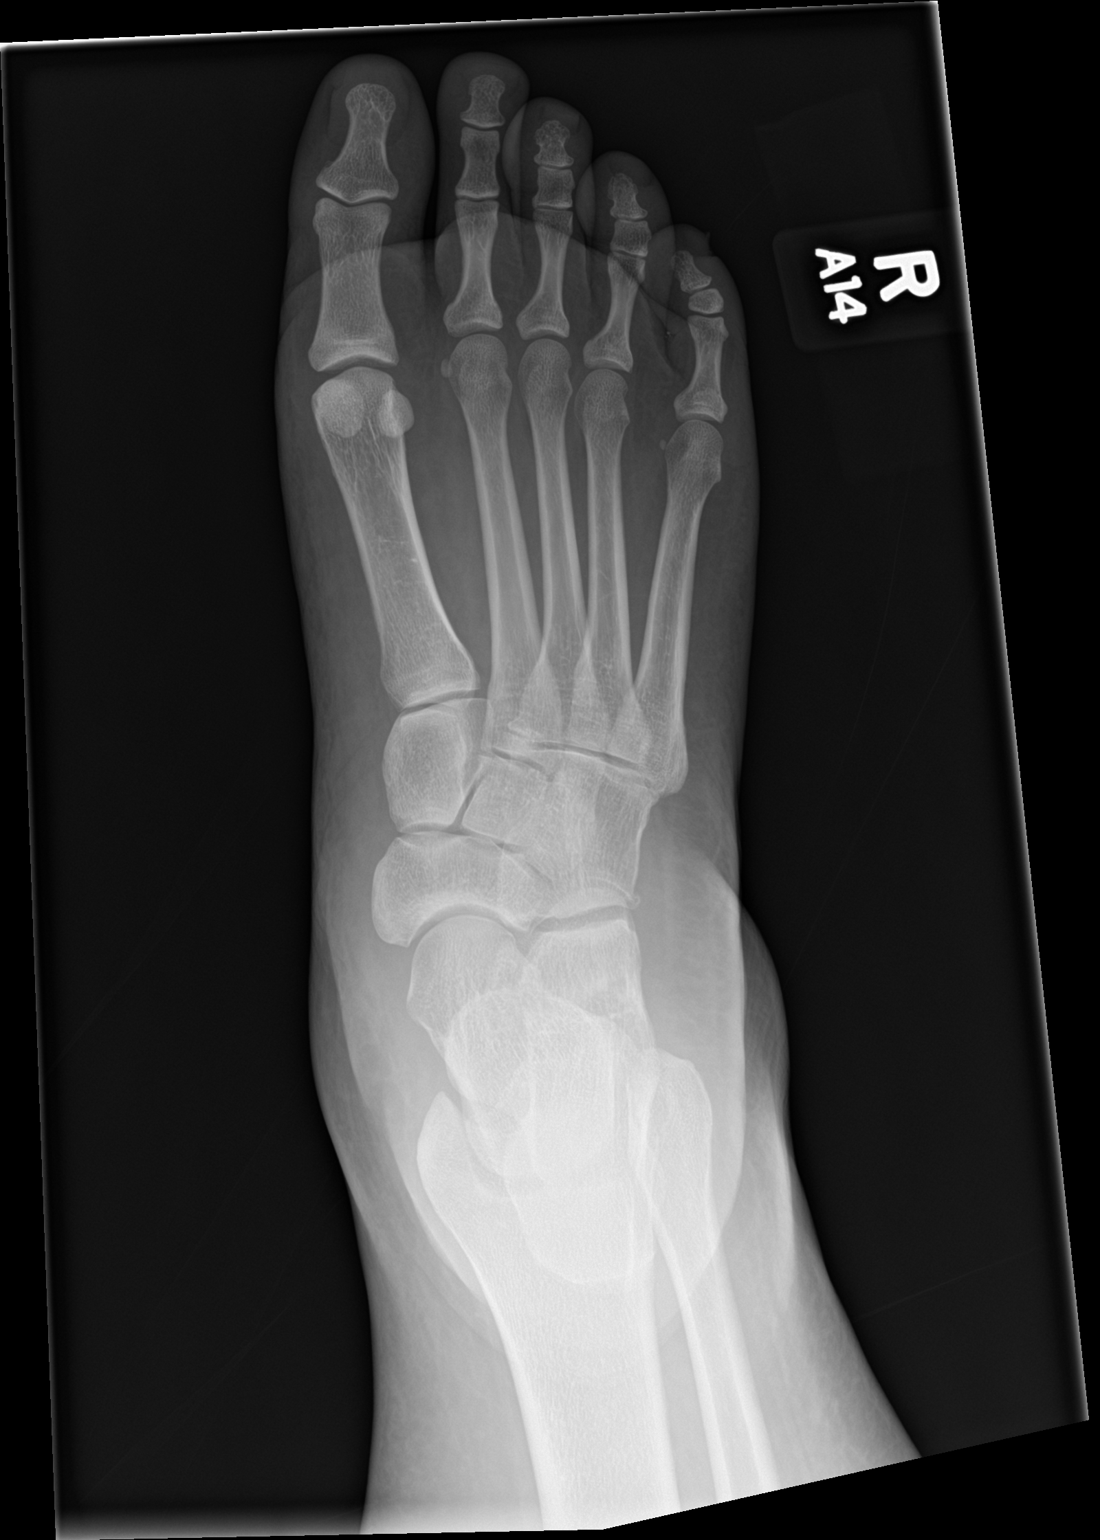

[foot obl]
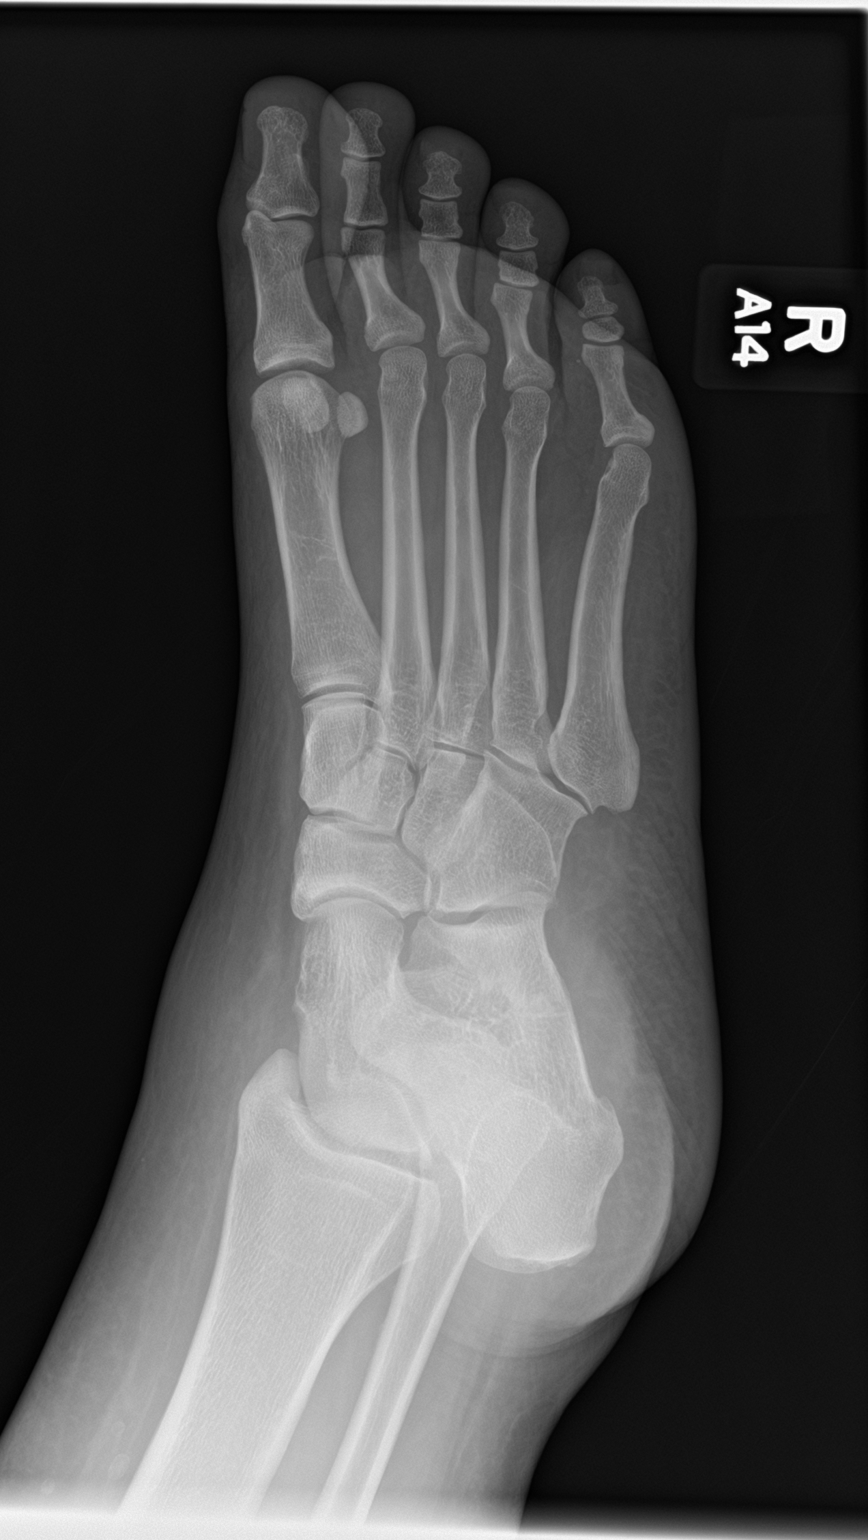

[foot lat]
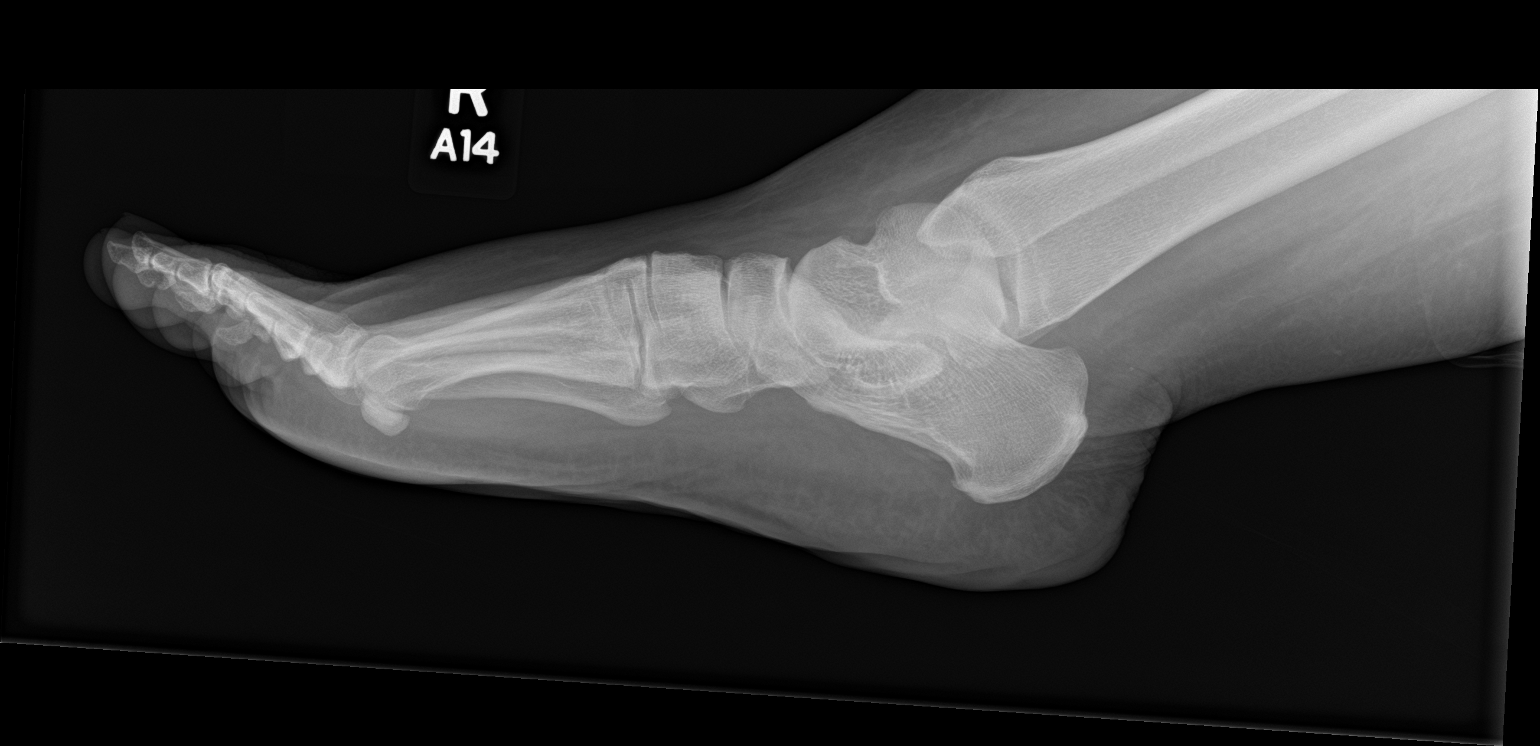

[3 of 3 positions shown; findings below may reference images not displayed]

FINDINGS: There is no evidence of fracture or dislocation. There is no
evidence of arthropathy or other focal bone abnormality. Soft
tissues are unremarkable.
IMPRESSION: Negative.

## 2019-08-13 ENCOUNTER — Other Ambulatory Visit: Payer: Self-pay | Admitting: Family Medicine

## 2019-08-13 DIAGNOSIS — K219 Gastro-esophageal reflux disease without esophagitis: Secondary | ICD-10-CM

## 2019-08-16 ENCOUNTER — Other Ambulatory Visit: Payer: Self-pay | Admitting: Family Medicine

## 2019-08-16 DIAGNOSIS — K219 Gastro-esophageal reflux disease without esophagitis: Secondary | ICD-10-CM

## 2021-01-17 ENCOUNTER — Other Ambulatory Visit: Payer: Self-pay

## 2021-01-17 ENCOUNTER — Ambulatory Visit: Payer: Self-pay | Attending: Physician Assistant | Admitting: Physician Assistant

## 2021-01-17 ENCOUNTER — Other Ambulatory Visit: Payer: Self-pay | Admitting: Physician Assistant

## 2021-01-17 ENCOUNTER — Encounter: Payer: Self-pay | Admitting: Physician Assistant

## 2021-01-17 VITALS — BP 136/56 | HR 109 | Temp 98.3°F | Resp 16 | Ht 63.0 in | Wt >= 6400 oz

## 2021-01-17 DIAGNOSIS — Z1331 Encounter for screening for depression: Secondary | ICD-10-CM

## 2021-01-17 DIAGNOSIS — R059 Cough, unspecified: Secondary | ICD-10-CM

## 2021-01-17 DIAGNOSIS — G8929 Other chronic pain: Secondary | ICD-10-CM

## 2021-01-17 DIAGNOSIS — M25562 Pain in left knee: Secondary | ICD-10-CM

## 2021-01-17 DIAGNOSIS — K219 Gastro-esophageal reflux disease without esophagitis: Secondary | ICD-10-CM

## 2021-01-17 DIAGNOSIS — G47419 Narcolepsy without cataplexy: Secondary | ICD-10-CM

## 2021-01-17 DIAGNOSIS — Z6841 Body Mass Index (BMI) 40.0 and over, adult: Secondary | ICD-10-CM

## 2021-01-17 DIAGNOSIS — R202 Paresthesia of skin: Secondary | ICD-10-CM

## 2021-01-17 DIAGNOSIS — R739 Hyperglycemia, unspecified: Secondary | ICD-10-CM

## 2021-01-17 MED ORDER — GABAPENTIN 300 MG PO CAPS: 300.0000 mg | ORAL_CAPSULE | Freq: Three times a day (TID) | ORAL | 3 refills | Status: DC

## 2021-01-17 MED ORDER — IBUPROFEN 800 MG PO TABS: 800.0000 mg | ORAL_TABLET | Freq: Two times a day (BID) | ORAL | 1 refills | Status: DC | PRN

## 2021-01-17 MED ORDER — PROMETHAZINE-DM 6.25-15 MG/5ML PO SYRP: 5.0000 mL | ORAL_SOLUTION | Freq: Four times a day (QID) | ORAL | 0 refills | Status: DC | PRN

## 2021-01-17 MED ORDER — METFORMIN HCL ER 500 MG PO TB24: 500.0000 mg | ORAL_TABLET | Freq: Every day | ORAL | 2 refills | Status: DC

## 2021-01-17 MED ORDER — OMEPRAZOLE 20 MG PO CPDR: 20.0000 mg | DELAYED_RELEASE_CAPSULE | Freq: Two times a day (BID) | ORAL | 3 refills | Status: DC

## 2021-01-17 MED ORDER — FLUOXETINE HCL 20 MG PO TABS: 40.0000 mg | ORAL_TABLET | Freq: Every day | ORAL | 3 refills | Status: DC

## 2021-01-17 MED FILL — IBUPROFEN 800 MG TABLET: 800 | 30 days supply | Qty: 60 | Fill #0

## 2021-01-17 MED FILL — metFORMIN HCL ER 500 MG TB2: 500 | 30 days supply | Qty: 30 | Fill #0

## 2021-01-17 MED FILL — OMEPRAZOLE 20 MG CAP: 20 | 30 days supply | Qty: 60 | Fill #0

## 2021-01-17 MED FILL — GABAPENTIN 300 MG CAPSULE: 300 | 30 days supply | Qty: 90 | Fill #0

## 2021-01-17 MED FILL — FLUoxetine HCL 20 MG TABS: 20 | 30 days supply | Qty: 60 | Fill #0

## 2021-01-17 MED FILL — PROMETHAZINE W/DM SYRUP: 6.25-15 | 5 days supply | Qty: 118 | Fill #0

## 2021-01-17 NOTE — Progress Notes (Signed)
[name], is a 30 y.o. female  WFU:[id number]  KGU:[id number]  DOB - 07-Jul-1991  Subjective:  Chief Complaint and HPI: [name] is a 30 y.o. female here today to re-establish care.  She had moved to [facility] and was seen by a Dr Manson Passey there.  She was prescribed adderall there for ADD and for narcolepsy.  She has chronic paresthesias and pain in her L knee she takes gabapentin for.    For depression currently she takes prozac.  Says this has helped her depression but shes been off of it for at least 1 month.  She denies SI/HI.  She has fleeting thoughts of "wishing I wasn't here."   Needs RF on other meds and needs to get UTD on labs.  She has not been taking metformin for >1 month.    Cough that is non-productive that keeps her awake at times  Very difficult to obtain history from   ROS:   Constitutional:  No f/c, No night sweats, No unexplained weight loss. EENT:  No vision changes, No blurry vision, No hearing changes. No mouth, throat, or ear problems.  Respiratory: + cough for a few days, No SOB Cardiac: No CP, no palpitations GI:  No abd pain, No N/V/D. GU: No Urinary s/sx Musculoskeletal: knee and back pain Neuro: No headache, no dizziness, no motor weakness.  Skin: No rash Endocrine:  No polydipsia. No polyuria.  Psych: Denies SI/HI  No problems updated.  ALLERGIES: Allergies  Allergen Reactions  . Banana Itching  . Food Itching    All fruits    PAST MEDICAL HISTORY: Past Medical History:  Diagnosis Date  . Acid reflux   . ADHD   . Asthma   . Clotting disorder (HCC)   . Depression   . 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 Exogenous obesity   . Narcolepsy cataplexy syndrome 2007   Prior sleep studies starting on September 26 2005, AHI 5.3 per hour,  . Poor sleep hygiene     MEDICATIONS AT HOME: Prior to Admission medications   Medication Sig  Start Date End Date Taking? Authorizing Provider  FLUoxetine (PROZAC) 20 MG tablet Take 2 tablets (40 mg total) by mouth daily. 01/17/21  Yes Georgian Co M, PA-C  omeprazole (PRILOSEC) 20 MG capsule Take 1 capsule (20 mg total) by mouth 2 (two) times daily before a meal. 01/17/21  Yes Finola Rosal M, PA-C  promethazine-dextromethorphan (PROMETHAZINE-DM) 6.25-15 MG/5ML syrup Take 5 mLs by mouth 4 (four) times daily as needed for cough. 01/17/21  Yes Georgian Co M, PA-C  acetaminophen (TYLENOL) 500 MG tablet Take 1,000 mg by mouth every 6 (six) hours as needed for mild pain. Patient not taking: Reported on 01/17/2021    [provider]  amphetamine-dextroamphetamine (ADDERALL) 20 MG tablet Take 20 mg by mouth daily. 09/26/20   [provider]  cetirizine (ZYRTEC) 10 MG tablet Take 1 tablet (10 mg total) by mouth daily. Patient not taking: Reported on 01/17/2021 06/30/19   Hoy Register, MD  clotrimazole (LOTRIMIN) 1 % cream Apply 1 application topically 2 (two) times daily. Patient not taking: Reported on 01/17/2021 09/15/18   Hoy Register, MD  ferrous sulfate 325 (65 FE) MG tablet Take 1 tablet (325 mg total) by mouth 3 (three) times daily with meals. Patient not taking: No sig reported 02/13/17   Dessa Phi, MD  gabapentin (NEURONTIN) 300 MG capsule Take  1 capsule (300 mg total) by mouth 3 (three) times daily. 01/17/21   Anders Simmonds, PA-C  ibuprofen (ADVIL) 800 MG tablet Take 1 tablet (800 mg total) by mouth every 12 (twelve) hours as needed. 01/17/21   Anders Simmonds, PA-C  metFORMIN (GLUCOPHAGE XR) 500 MG 24 hr tablet Take 1 tablet (500 mg total) by mouth daily after supper. 01/17/21   Anders Simmonds, PA-C  naproxen (NAPROSYN) 250 MG tablet Take 250 mg by mouth daily as needed for mild pain. Patient not taking: Reported on 01/17/2021    [provider]  nystatin cream (MYCOSTATIN) Apply to affected area 2 times daily Patient not taking: No sig reported  11/15/17   Aviva Kluver B, PA-C  topiramate (TOPAMAX) 50 MG tablet Take 1 tablet (50 mg total) by mouth 2 (two) times daily. Patient not taking: Reported on 01/17/2021 09/15/18   Hoy Register, MD  traMADol (ULTRAM) 50 MG tablet Take 1 tablet (50 mg total) by mouth at bedtime as needed. Patient not taking: Reported on 01/17/2021 02/17/19   Hoy Register, MD  Vitamin D, Ergocalciferol, (DRISDOL) 50000 units CAPS capsule Take 1 capsule (50,000 Units total) by mouth every 7 (seven) days. Patient not taking: No sig reported 11/20/17   Anders Simmonds, PA-C     Objective:  EXAM:   Vitals:   01/17/21 1349  BP: (!) 136/56  Pulse: (!) 109  Resp: 16  Temp: 98.3 F (36.8 C)  SpO2: 98%  Weight: (!) 408 lb (185.1 kg)  Height: 5\' 3"  (1.6 m)    General appearance : A&OX3. NAD. Non-toxic-appearing;  Morbidly obese.  Difficult to obtain history. HEENT: Atraumatic and Normocephalic.  PERRLA. EOM intact. Neck: supple, no JVD. No cervical lymphadenopathy. No thyromegaly Chest/Lungs:  Breathing-non-labored, Good air entry bilaterally, breath sounds normal without rales, rhonchi, or wheezing  CVS: S1 S2 regular, no murmurs, gallops, rubs  Extremities: Bilateral Lower Ext shows no edema, both legs are warm to touch with = pulse throughout Neurology:  CN II-XII grossly intact, Non focal.   Psych:  TP hard to follow. J/I poor to fair. Normal speech. Appropriate eye contact and blunted affect.  Skin:  No Rash  Data Review Lab Results  Component Value Date   HGBA1C 5.6 12/29/2017   HGBA1C 5.7 01/10/2017   HGBA1C 5.30 03/28/2015   Depression screen PHQ 2/9 01/17/2021 05/05/2018 01/27/2018  Decreased Interest 3 3 3   Down, Depressed, Hopeless 3 3 3   PHQ - 2 Score 6 6 6   Altered sleeping 3 3 3   Tired, decreased energy 3 3 3   Change in appetite 3 2 3   Feeling bad or failure about yourself  3 3 3   Trouble concentrating 3 3 3   Moving slowly or fidgety/restless 2 3 2   Suicidal thoughts 1 0 0  PHQ-9  Score 24 23 23   Some recent data might be hidden     Assessment & Plan   1. Paresthesia - gabapentin (NEURONTIN) 300 MG capsule; Take 1 capsule (300 mg total) by mouth 3 (three) times daily.  Dispense: 90 capsule; Refill: 3  2. Gastroesophageal reflux disease - omeprazole (PRILOSEC) 20 MG capsule; Take 1 capsule (20 mg total) by mouth 2 (two) times daily before a meal.  Dispense: 60 capsule; Refill: 3  3. Morbid obesity with body mass index of 70 and over in adult [facility]) resume - metFORMIN (GLUCOPHAGE XR) 500 MG 24 hr tablet; Take 1 tablet (500 mg total) by mouth daily after supper.  Dispense:  30 tablet; Refill: 2 - CBC with Differential/Platelet  4. Chronic pain of left knee - gabapentin (NEURONTIN) 300 MG capsule; Take 1 capsule (300 mg total) by mouth 3 (three) times daily.  Dispense: 90 capsule; Refill: 3 - ibuprofen (ADVIL) 800 MG tablet; Take 1 tablet (800 mg total) by mouth every 12 (twelve) hours as needed.  Dispense: 60 tablet; Refill: 1 - CBC with Differential/Platelet  5. Hyperglycemia resume - metFORMIN (GLUCOPHAGE XR) 500 MG 24 hr tablet; Take 1 tablet (500 mg total) by mouth daily after supper.  Dispense: 30 tablet; Refill: 2 - Hemoglobin A1c - Comprehensive metabolic panel - CBC with Differential/Platelet  6. Narcolepsy without cataplexy Unable to prescribe adderall here - Ambulatory referral to [facility]  7. Positive depression screening Resume prozac-contracts  - FLUoxetine (PROZAC) 20 MG tablet; Take 2 tablets (40 mg total) by mouth daily.  Dispense: 60 tablet; Refill: 3 - Ambulatory referral to [facility] - Thyroid Panel With TSH - Comprehensive metabolic panel  8. Cough - promethazine-dextromethorphan (PROMETHAZINE-DM) 6.25-15 MG/5ML syrup; Take 5 mLs by mouth 4 (four) times daily as needed for cough.  Dispense: 118 mL; Refill: 0   Patient have been counseled extensively about nutrition and exercise  Return in  about 1 month (around 02/14/2021) for see Dr Alvis Lemmings to reestablish care.  The patient was given clear instructions to go to ER or return to medical center if symptoms don't improve, worsen or new problems develop. The patient verbalized understanding. The patient was told to call to get lab results if they haven't heard anything in the next week.     Georgian Co, PA-C [facility] [place], [place] [phone]   01/17/2021, 3:17 PMPatient ID: [name], female   DOB: 11-Mar-1991, 30 y.o.   MRN: [id number]

## 2021-01-18 ENCOUNTER — Other Ambulatory Visit: Payer: Self-pay | Admitting: Physician Assistant

## 2021-01-18 DIAGNOSIS — D509 Iron deficiency anemia, unspecified: Secondary | ICD-10-CM

## 2021-01-18 LAB — CBC WITH DIFFERENTIAL/PLATELET
Basophils Absolute: 0.1 10*3/uL (ref 0.0–0.2)
Basos: 1 %
EOS (ABSOLUTE): 0.1 10*3/uL (ref 0.0–0.4)
Eos: 1 %
Hematocrit: 29.1 % — ABNORMAL LOW (ref 34.0–46.6)
Hemoglobin: 7.9 g/dL — ABNORMAL LOW (ref 11.1–15.9)
Immature Grans (Abs): 0 10*3/uL (ref 0.0–0.1)
Immature Granulocytes: 0 %
Lymphocytes Absolute: 2.4 10*3/uL (ref 0.7–3.1)
Lymphs: 34 %
MCH: 15.8 pg — ABNORMAL LOW (ref 26.6–33.0)
MCHC: 27.1 g/dL — ABNORMAL LOW (ref 31.5–35.7)
MCV: 58 fL — ABNORMAL LOW (ref 79–97)
Monocytes Absolute: 0.9 10*3/uL (ref 0.1–0.9)
Monocytes: 13 %
Neutrophils Absolute: 3.7 10*3/uL (ref 1.4–7.0)
Neutrophils: 51 %
Platelets: 304 10*3/uL (ref 150–450)
RBC: 5 x10E6/uL (ref 3.77–5.28)
RDW: 22.1 % — ABNORMAL HIGH (ref 11.7–15.4)
WBC: 7.2 10*3/uL (ref 3.4–10.8)

## 2021-01-18 LAB — COMPREHENSIVE METABOLIC PANEL
ALT: 13 IU/L (ref 0–32)
AST: 14 IU/L (ref 0–40)
Albumin/Globulin Ratio: 1.3 (ref 1.2–2.2)
Albumin: 3.9 g/dL (ref 3.8–4.8)
Alkaline Phosphatase: 113 IU/L (ref 44–121)
BUN/Creatinine Ratio: 13 (ref 9–23)
BUN: 7 mg/dL (ref 6–20)
Bilirubin Total: 0.5 mg/dL (ref 0.0–1.2)
CO2: 23 mmol/L (ref 20–29)
Calcium: 8.9 mg/dL (ref 8.7–10.2)
Chloride: 104 mmol/L (ref 96–106)
Creatinine, Ser: 0.53 mg/dL — ABNORMAL LOW (ref 0.57–1.00)
GFR calc Af Amer: 146 mL/min/{1.73_m2} (ref >59)
GFR calc non Af Amer: 127 mL/min/{1.73_m2} (ref >59)
Globulin, Total: 3.1 g/dL (ref 1.5–4.5)
Glucose: 92 mg/dL (ref 65–99)
Potassium: 4.2 mmol/L (ref 3.5–5.2)
Sodium: 139 mmol/L (ref 134–144)
Total Protein: 7 g/dL (ref 6.0–8.5)

## 2021-01-18 LAB — HEMOGLOBIN A1C
Est. average glucose Bld gHb Est-mCnc: 114 mg/dL
Hgb A1c MFr Bld: 5.6 % (ref 4.8–5.6)

## 2021-01-18 LAB — THYROID PANEL WITH TSH
Free Thyroxine Index: 1.6 (ref 1.2–4.9)
T3 Uptake Ratio: 22 % — ABNORMAL LOW (ref 24–39)
T4, Total: 7.1 ug/dL (ref 4.5–12.0)
TSH: 1.73 u[IU]/mL (ref 0.450–4.500)

## 2021-01-18 MED ORDER — FERROUS SULFATE 325 (65 FE) MG PO TABS: 325.0000 mg | ORAL_TABLET | Freq: Two times a day (BID) | ORAL | 5 refills | Status: DC

## 2021-01-18 MED FILL — FERROUS SULFATE 325 MG TAB: 325 (65 FE) | 30 days supply | Qty: 60 | Fill #0

## 2021-01-22 ENCOUNTER — Telehealth: Payer: Self-pay | Admitting: Family Medicine

## 2021-01-22 NOTE — Telephone Encounter (Signed)
Copied from CRM [id number]. Topic: General - Other >> Jan 22, 2021  3:53 PM Elliot Gault wrote: Reason for CRM: patient is following up regarding  [facility] referral and would like a follow up call because she has not heard from specialist.   Not sure if this was regarding a referral to [facility] or another referral. If with [facility] I can contact patient and schedule.

## 2021-01-23 NOTE — Telephone Encounter (Signed)
Gm  I don't have a referral for her  only the one pcp sent to [facility]  . Thank you  .

## 2021-01-24 ENCOUNTER — Telehealth: Payer: Self-pay | Admitting: Licensed Clinical Social Worker

## 2021-01-24 NOTE — Telephone Encounter (Signed)
MSW Intern called to schedule an appt with SW services but pt was unavailable. A message was left including a callback number. Please schedule the pt on Jasmine's schedule if they callback.

## 2021-01-24 NOTE — Telephone Encounter (Signed)
Yes thanks! My intern tried to call and schedule an appt but didn't get through and left a message.

## 2021-01-26 ENCOUNTER — Ambulatory Visit: Payer: Medicaid Other

## 2021-01-31 ENCOUNTER — Other Ambulatory Visit: Payer: Self-pay

## 2021-01-31 ENCOUNTER — Telehealth: Payer: Self-pay | Admitting: Licensed Clinical Social Worker

## 2021-01-31 ENCOUNTER — Ambulatory Visit: Payer: Self-pay | Attending: Family Medicine

## 2021-01-31 ENCOUNTER — Encounter: Payer: Self-pay | Admitting: Licensed Clinical Social Worker

## 2021-01-31 NOTE — Progress Notes (Signed)
LCSW met with patient after her appointment with Financial Counselor. Pt shared that she is interested in medication management. LCSW discussed services provided through [facility]. Pt was provided with their contact information. States that she has difficulty with transportation (mother is disabled and can drive occasionally) LCSW assisted patient with registering for MyChart to provide option of video visits.   Pt is requesting an internal referral to [facility] to initiate medication management and/or therapy.

## 2021-01-31 NOTE — Telephone Encounter (Signed)
Call placed to patient regarding [facility] referral. LCSW left message requesting a return call.  

## 2021-02-09 ENCOUNTER — Telehealth: Payer: Self-pay | Admitting: Family Medicine

## 2021-02-09 NOTE — Telephone Encounter (Signed)
Copied from CRM [id number]. Topic: General - Other >> Feb 08, 2021  1:51 PM [name] E wrote: Reason for CRM: Pt called to find out if about her referral from jasmine to the Mental health clinic/ she called to see if she has an appt with them / Pt is not aware of the name of the place and has not heard from them /please advise  I see where patient requested at last visit for referral but did not see any referral. Please follow up.

## 2021-02-15 ENCOUNTER — Telehealth: Payer: Self-pay | Admitting: Licensed Clinical Social Worker

## 2021-02-15 NOTE — Telephone Encounter (Signed)
Follow up call placed to patient. Patient was informed that LCSW has messaged referral coordinator at [facility] regarding therapy referral. Pt was provided the agency's contact information and was strongly encouraged to call and schedule an appointment. No additional concerns noted.

## 2021-02-20 ENCOUNTER — Ambulatory Visit: Payer: Medicaid Other | Admitting: Family Medicine

## 2021-03-05 ENCOUNTER — Other Ambulatory Visit: Payer: Self-pay

## 2021-03-05 ENCOUNTER — Telehealth (HOSPITAL_COMMUNITY): Payer: Self-pay | Admitting: Professional

## 2021-03-05 ENCOUNTER — Ambulatory Visit (HOSPITAL_COMMUNITY): Payer: No Payment, Other | Admitting: Professional

## 2021-03-05 NOTE — Telephone Encounter (Signed)
See call log 

## 2021-03-19 ENCOUNTER — Other Ambulatory Visit: Payer: Self-pay

## 2021-03-19 ENCOUNTER — Ambulatory Visit (INDEPENDENT_AMBULATORY_CARE_PROVIDER_SITE_OTHER): Payer: No Payment, Other | Admitting: Professional

## 2021-03-19 DIAGNOSIS — F331 Major depressive disorder, recurrent, moderate: Secondary | ICD-10-CM | POA: Diagnosis not present
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
## 2021-03-27 ENCOUNTER — Ambulatory Visit: Payer: Medicaid Other | Admitting: Family Medicine

## 2021-03-28 ENCOUNTER — Other Ambulatory Visit: Payer: Self-pay

## 2021-03-28 ENCOUNTER — Telehealth (INDEPENDENT_AMBULATORY_CARE_PROVIDER_SITE_OTHER): Payer: No Payment, Other | Admitting: Family

## 2021-03-28 ENCOUNTER — Encounter (HOSPITAL_COMMUNITY): Payer: Self-pay | Admitting: Family

## 2021-03-28 DIAGNOSIS — F331 Major depressive disorder, recurrent, moderate: Secondary | ICD-10-CM

## 2021-03-28 MED ORDER — HYDROXYZINE HCL 10 MG PO TABS
10.0000 mg | ORAL_TABLET | Freq: Three times a day (TID) | ORAL | 0 refills | Status: DC | PRN
  Filled 2021-03-28: qty 30, 10d supply, fill #0
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
## 2021-03-30 ENCOUNTER — Telehealth (HOSPITAL_COMMUNITY): Payer: No Payment, Other | Admitting: Physician Assistant

## 2021-04-04 ENCOUNTER — Other Ambulatory Visit: Payer: Self-pay

## 2021-04-18 ENCOUNTER — Ambulatory Visit: Payer: Medicaid Other | Admitting: Family Medicine

## 2021-04-30 ENCOUNTER — Encounter (HOSPITAL_COMMUNITY): Payer: Self-pay | Admitting: Psychiatry

## 2021-04-30 ENCOUNTER — Other Ambulatory Visit: Payer: Self-pay

## 2021-04-30 ENCOUNTER — Telehealth (INDEPENDENT_AMBULATORY_CARE_PROVIDER_SITE_OTHER): Payer: No Payment, Other | Admitting: Psychiatry

## 2021-04-30 DIAGNOSIS — F330 Major depressive disorder, recurrent, mild: Secondary | ICD-10-CM | POA: Diagnosis not present

## 2021-04-30 DIAGNOSIS — F900 Attention-deficit hyperactivity disorder, predominantly inattentive type: Secondary | ICD-10-CM | POA: Diagnosis not present

## 2021-04-30 DIAGNOSIS — F411 Generalized anxiety disorder: Secondary | ICD-10-CM | POA: Diagnosis not present

## 2021-04-30 MED ORDER — HYDROXYZINE HCL 10 MG PO TABS
10.0000 mg | ORAL_TABLET | Freq: Three times a day (TID) | ORAL | 2 refills | Status: DC | PRN
  Filled 2021-04-30 – 2021-05-21 (×2): qty 90, 30d supply, fill #0

## 2021-04-30 MED ORDER — FLUOXETINE HCL 20 MG PO TABS
40.0000 mg | ORAL_TABLET | Freq: Every day | ORAL | 3 refills | Status: DC
  Filled 2021-04-30 – 2021-05-21 (×2): qty 60, 30d supply, fill #0

## 2021-04-30 MED ORDER — AMPHETAMINE-DEXTROAMPHETAMINE 20 MG PO TABS: 20.0000 mg | ORAL_TABLET | Freq: Every day | ORAL | 0 refills | Status: DC
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
## 2021-05-07 ENCOUNTER — Other Ambulatory Visit: Payer: Self-pay

## 2021-05-15 ENCOUNTER — Ambulatory Visit: Payer: Medicaid Other | Admitting: Family Medicine

## 2021-05-21 ENCOUNTER — Other Ambulatory Visit: Payer: Self-pay

## 2021-05-21 MED FILL — Metformin HCl Tab ER 24HR 500 MG: ORAL | 30 days supply | Qty: 30 | Fill #0 | Status: AC

## 2021-05-21 MED FILL — Omeprazole Cap Delayed Release 20 MG: ORAL | 30 days supply | Qty: 60 | Fill #0 | Status: AC

## 2021-05-22 ENCOUNTER — Other Ambulatory Visit: Payer: Self-pay

## 2021-05-23 ENCOUNTER — Other Ambulatory Visit: Payer: Self-pay

## 2021-05-23 ENCOUNTER — Other Ambulatory Visit: Payer: Self-pay | Admitting: Family Medicine

## 2021-05-23 DIAGNOSIS — G8929 Other chronic pain: Secondary | ICD-10-CM

## 2021-05-23 DIAGNOSIS — R202 Paresthesia of skin: Secondary | ICD-10-CM

## 2021-05-23 MED ORDER — GABAPENTIN 300 MG PO CAPS
300.0000 mg | ORAL_CAPSULE | Freq: Three times a day (TID) | ORAL | 0 refills | Status: DC
  Filled 2021-05-23: qty 90, 30d supply, fill #0

## 2021-05-23 NOTE — Telephone Encounter (Signed)
Medication Refill - Medication: gabapentin (NEURONTIN) 300 MG capsule   Preferred Pharmacy (with phone number or street name): [facility]  Agent: Please be advised that RX refills may take up to 3 business days. We ask that you follow-up with your pharmacy.

## 2021-05-24 ENCOUNTER — Other Ambulatory Visit: Payer: Self-pay

## 2021-05-25 ENCOUNTER — Other Ambulatory Visit: Payer: Self-pay

## 2021-06-01 ENCOUNTER — Ambulatory Visit: Payer: Self-pay

## 2021-06-01 NOTE — Telephone Encounter (Signed)
Patient called, no answer, unable to leave VM due to mailbox is full.    Message from Glean Salen sent at 06/01/2021  4:03 PM EDT  Patient called , states she has pain with toothache, and bump on roof of her mouth. She hasn't contacted dentist because she doesn't have insurance. She is requesting an antibiotic. Please call back

## 2021-06-01 NOTE — Telephone Encounter (Signed)
Patient called, no answer, unable to leave VM due to mailbox is full.

## 2021-06-01 NOTE — Telephone Encounter (Signed)
Copied from CRM [id number]. Topic: General - Other >> Jun 01, 2021  2:01 PM Pawlus, Maxine Glenn A wrote: Reason for CRM: Pt stated she has a toothache and wanted to know if an antibotic could be called in, pt has future appt 6/30 but stated she doesn't think she can wait that long. Please advise.

## 2021-06-04 NOTE — Telephone Encounter (Signed)
Pt requesting medication for toothache.

## 2021-06-05 ENCOUNTER — Other Ambulatory Visit: Payer: Self-pay

## 2021-06-05 MED ORDER — AMOXICILLIN 500 MG PO CAPS
500.0000 mg | ORAL_CAPSULE | Freq: Three times a day (TID) | ORAL | 0 refills | Status: AC
  Filled 2021-06-05: qty 30, 10d supply, fill #0

## 2021-06-05 NOTE — Telephone Encounter (Signed)
Antibiotic prescription sent to the pharmacy.

## 2021-06-05 NOTE — Addendum Note (Signed)
Addended byHoy Register on: 06/05/2021 08:18 AM   Modules accepted: Orders

## 2021-06-05 NOTE — Telephone Encounter (Signed)
Attempted to contact patient and VM is currently full.

## 2021-06-06 ENCOUNTER — Other Ambulatory Visit: Payer: Self-pay

## 2021-06-07 ENCOUNTER — Ambulatory Visit (HOSPITAL_COMMUNITY): Payer: No Payment, Other | Admitting: Licensed Clinical Social Worker

## 2021-06-07 ENCOUNTER — Telehealth (HOSPITAL_COMMUNITY): Payer: Self-pay | Admitting: Licensed Clinical Social Worker

## 2021-06-07 NOTE — Telephone Encounter (Signed)
LCSW sent two links to pt phone with no response. LCSW f/u with a phone call after no response in virtual links. LCSW left HIPAA compliant VM

## 2021-06-14 ENCOUNTER — Ambulatory Visit: Payer: Medicaid Other | Admitting: Internal Medicine

## 2021-07-03 ENCOUNTER — Telehealth: Payer: Self-pay | Admitting: Family Medicine

## 2021-07-03 ENCOUNTER — Other Ambulatory Visit: Payer: Self-pay

## 2021-07-03 ENCOUNTER — Encounter: Payer: Self-pay | Admitting: Family Medicine

## 2021-07-03 ENCOUNTER — Ambulatory Visit: Payer: Self-pay | Attending: Family Medicine | Admitting: Family Medicine

## 2021-07-03 DIAGNOSIS — G4733 Obstructive sleep apnea (adult) (pediatric): Secondary | ICD-10-CM

## 2021-07-03 DIAGNOSIS — G43709 Chronic migraine without aura, not intractable, without status migrainosus: Secondary | ICD-10-CM

## 2021-07-03 DIAGNOSIS — D509 Iron deficiency anemia, unspecified: Secondary | ICD-10-CM

## 2021-07-03 DIAGNOSIS — M545 Low back pain, unspecified: Secondary | ICD-10-CM

## 2021-07-03 DIAGNOSIS — G47419 Narcolepsy without cataplexy: Secondary | ICD-10-CM

## 2021-07-03 DIAGNOSIS — G8929 Other chronic pain: Secondary | ICD-10-CM

## 2021-07-03 DIAGNOSIS — R110 Nausea: Secondary | ICD-10-CM

## 2021-07-03 MED ORDER — CYCLOBENZAPRINE HCL 10 MG PO TABS
10.0000 mg | ORAL_TABLET | Freq: Every day | ORAL | 1 refills | Status: DC
  Filled 2021-07-03: qty 30, 30d supply, fill #0
  Filled 2021-12-17: qty 30, 30d supply, fill #1

## 2021-07-03 MED ORDER — TOPIRAMATE 50 MG PO TABS
50.0000 mg | ORAL_TABLET | Freq: Two times a day (BID) | ORAL | 3 refills | Status: DC
  Filled 2021-07-03: qty 60, 30d supply, fill #0

## 2021-07-03 NOTE — Telephone Encounter (Signed)
This patient has narcolepsy, obstructive sleep apnea and lost her CPAP machine in a move.  Can we see if she would be a candidate for the [place] fund to obtain a new CPAP?  Thank you.

## 2021-07-03 NOTE — Progress Notes (Signed)
Virtual Visit via Telephone Note  I connected with [name], on 07/03/2021 at 10:28 AM by telephone due to the COVID-19 pandemic and verified that I am speaking with the correct person using two identifiers.   Consent: I discussed the limitations, risks, security and privacy concerns of performing an evaluation and management service by telephone and the availability of in person appointments. I also discussed with the patient that there may be a patient responsible charge related to this service. The patient expressed understanding and agreed to proceed.   Location of Patient: Home  Location of Provider: Clinic   Persons participating in Telemedicine visit: [name] Dr. Alvis Lemmings     History of Present Illness: [name]  is a 30 year old female with a history of morbid obesity, Obstructive sleep apnea, anemia secondary to menorrhagia who presents today for a follow-up visit.  She has had moderate back pain in her lower back which does not radiate and Ibuprofen does not provide relief. She also complains of headaches and feeling 'sick in her stomach'. She has a history of migraines and her chart reveals she was previously on Topamax which she has not been taking for recent. She does not use a CPAP machine as she lost it during a move. She does not sleep well at night and has daytime somnolence.  Sleep study from 09/2016 revealed: IMPRESSIONS - Mild obstructive sleep apnea occurred during this study (AHI = 5.6/h). - No significant central sleep apnea occurred during this study (CAI = 0.9/h). - Oxygen desaturation to 82% was noted during this study . - The patient snored with Loud snoring volume. - No cardiac abnormalities were noted during this study. - Clinically significant periodic limb movements did not occur during sleep. No significant associated arousals.   DIAGNOSIS - Obstructive Sleep Apnea (327.23 [G47.33 ICD-10])   RECOMMENDATIONS -  Very mild obstructive sleep apnea. Return to discuss treatment options. - Avoid alcohol, sedatives and other CNS depressants that may worsen sleep apnea and disrupt normal sleep architecture. - Sleep hygiene should be reviewed to assess factors that may improve sleep quality. - Weight management and regular exercise should be initiated or continued if appropriate.    Her med list reveals she is on omeprazole which she states does help with the nausea. She is not sexually active She would like her iron level checked. She stull has menorrhagia which she states has improved. Her periods last for 1 week and she denies passage of blood clots.  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 (Patient not taking: Reported on 01/17/2021)     amphetamine-dextroamphetamine (ADDERALL) 20 MG tablet Take 1 tablet (20 mg total) by mouth daily. 30 tablet 0   cetirizine (ZYRTEC)  10 MG tablet Take 1 tablet (10 mg total) by mouth daily. (Patient not taking: Reported on 01/17/2021) 30 tablet 11   clotrimazole (LOTRIMIN) 1 % cream Apply 1 application topically 2 (two) times daily. (Patient not taking: Reported on 01/17/2021) 30 g 1   ferrous sulfate 325 (65 FE) MG tablet Take 1 tablet (325 mg total) by mouth 2 (two) times daily with a meal. 60 tablet 5   ferrous sulfate 325 (65 FE) MG tablet TAKE 1  TABLET (325 MG TOTAL) BY MOUTH 2 (TWO) TIMES DAILY WITH A MEAL. 60 tablet 5   FLUoxetine (PROZAC) 20 MG tablet Take 2 tablets (40 mg total) by mouth daily. 60 tablet 3   gabapentin (NEURONTIN) 300 MG capsule Take 1 capsule (300 mg total) by mouth 3 (three) times daily. 90 capsule 0   hydrOXYzine (ATARAX/VISTARIL) 10 MG tablet Take 1 tablet (10 mg total) by mouth 3 (three) times daily as needed. 90 tablet 2   ibuprofen (ADVIL) 800 MG tablet Take 1 tablet (800 mg total) by mouth every 12 (twelve) hours as needed. 60 tablet 1   ibuprofen (ADVIL) 800 MG tablet TAKE 1 TABLET (800 MG TOTAL) BY MOUTH EVERY 12 (TWELVE) HOURS AS NEEDED. 60 tablet 1   metFORMIN (GLUCOPHAGE XR) 500 MG 24 hr tablet Take 1 tablet (500 mg total) by mouth daily after supper. 30 tablet 2   metFORMIN (GLUCOPHAGE-XR) 500 MG 24 hr tablet TAKE 1 TABLET (500 MG TOTAL) BY MOUTH DAILY AFTER SUPPER. 30 tablet 2   nystatin cream (MYCOSTATIN) Apply to affected area 2 times daily (Patient not taking: No sig reported) 30 g 0   omeprazole (PRILOSEC) 20 MG capsule Take 1 capsule (20 mg total) by mouth 2 (two) times daily before a meal. 60 capsule 3   omeprazole (PRILOSEC) 20 MG capsule TAKE 1 CAPSULE (20 MG TOTAL) BY MOUTH 2 (TWO) TIMES DAILY BEFORE A MEAL. 60 capsule 3   promethazine-dextromethorphan (PROMETHAZINE-DM) 6.25-15 MG/5ML syrup Take 5 mLs by mouth 4 (four) times daily as needed for cough. 118 mL 0   promethazine-dextromethorphan (PROMETHAZINE-DM) 6.25-15 MG/5ML syrup TAKE 5 MLS BY MOUTH 4 (FOUR) TIMES DAILY AS NEEDED FOR COUGH. 118 mL 0   topiramate (TOPAMAX) 50 MG tablet Take 1 tablet (50 mg total) by mouth 2 (two) times daily. (Patient not taking: Reported on 01/17/2021) 60 tablet 3   Vitamin D, Ergocalciferol, (DRISDOL) 50000 units CAPS capsule Take 1 capsule (50,000 Units total) by mouth every 7 (seven) days. (Patient not taking: No sig reported) 16 capsule 0   No current facility-administered medications on file prior to visit.     ROS: See HPI  Observations/Objective: Somnolent during encounter, repeating sentences over and over again Not in acute distress Normal mood  Assessment and Plan: 1. Microcytic anemia Likely due to menorrhagia Last hemoglobin was 7.9 If she is still anemic will need to work-up for menorrhagia and refer to GYN - CBC with Differential/Platelet; Future - Iron, TIBC and Ferritin Panel; Future  2. Chronic migraine without aura without status migrainosus, not intractable Uncontrolled Restarted Topamax-discussed of teratogenicity.  She is currently not sexually active and says she does not need contraception - topiramate (TOPAMAX) 50 MG tablet; Take 1 tablet (50 mg total) by mouth 2 (two) times daily.  Dispense: 60 tablet; Refill: 3  3. Narcolepsy without cataplexy Could explain daytime somnolence  4. Obstructive sleep apnea Sleep study revealed mild obstructive sleep apnea She does have overlapping symptoms between her narcolepsy, obstructive sleep apnea and  migraines Will reach out to case manager for assistance with CPAP given patient has no medical coverage  5. Nausea Currently on omeprazole which has been beneficial  6. Chronic midline low back pain without sciatica Suspicious for musculoskeletal pain Weight loss will be beneficial We will add Flexeril to current regimen of ibuprofen - cyclobenzaprine (FLEXERIL) 10 MG tablet; Take 1 tablet (10 mg total) by mouth at bedtime.  Dispense: 30 tablet; Refill: 1   Follow Up Instructions: 3 months   I discussed the assessment and treatment plan with the patient. The patient was provided an opportunity to ask questions and all were answered. The patient agreed with the plan and demonstrated an understanding of the instructions.   The patient was advised to call back or seek an in-person evaluation if the symptoms worsen or if the condition fails to improve as anticipated.     I provided 15 minutes total of non-face-to-face  time during this encounter.   Hoy Register, MD, FAAFP. [facility] [place], [place] [phone]   07/03/2021, 10:28 AM

## 2021-07-04 ENCOUNTER — Other Ambulatory Visit: Payer: Self-pay

## 2021-07-04 ENCOUNTER — Ambulatory Visit: Payer: Self-pay | Admitting: Family Medicine

## 2021-07-04 NOTE — Addendum Note (Signed)
Addended byMemory Dance on: 07/04/2021 03:33 PM   Modules accepted: Orders

## 2021-07-05 ENCOUNTER — Other Ambulatory Visit: Payer: Self-pay | Admitting: Family Medicine

## 2021-07-05 DIAGNOSIS — N920 Excessive and frequent menstruation with regular cycle: Secondary | ICD-10-CM

## 2021-07-05 LAB — CBC WITH DIFFERENTIAL/PLATELET
Basophils Absolute: 0.1 10*3/uL (ref 0.0–0.2)
Basos: 1 %
EOS (ABSOLUTE): 0.1 10*3/uL (ref 0.0–0.4)
Eos: 1 %
Hematocrit: 28 % — ABNORMAL LOW (ref 34.0–46.6)
Hemoglobin: 7.5 g/dL — ABNORMAL LOW (ref 11.1–15.9)
Immature Grans (Abs): 0 10*3/uL (ref 0.0–0.1)
Immature Granulocytes: 0 %
Lymphocytes Absolute: 2.5 10*3/uL (ref 0.7–3.1)
Lymphs: 28 %
MCH: 15.8 pg — ABNORMAL LOW (ref 26.6–33.0)
MCHC: 26.8 g/dL — ABNORMAL LOW (ref 31.5–35.7)
MCV: 59 fL — ABNORMAL LOW (ref 79–97)
Monocytes Absolute: 1 10*3/uL — ABNORMAL HIGH (ref 0.1–0.9)
Monocytes: 11 %
Neutrophils Absolute: 5.4 10*3/uL (ref 1.4–7.0)
Neutrophils: 59 %
Platelets: 360 10*3/uL (ref 150–450)
RBC: 4.76 x10E6/uL (ref 3.77–5.28)
RDW: 20.7 % — ABNORMAL HIGH (ref 11.7–15.4)
WBC: 9.1 10*3/uL (ref 3.4–10.8)

## 2021-07-05 LAB — IRON,TIBC AND FERRITIN PANEL
Ferritin: 12 ng/mL — ABNORMAL LOW (ref 15–150)
Iron Saturation: 3 % — CL (ref 15–55)
Iron: 10 ug/dL — ABNORMAL LOW (ref 27–159)
Total Iron Binding Capacity: 364 ug/dL (ref 250–450)
UIBC: 354 ug/dL (ref 131–425)

## 2021-07-06 ENCOUNTER — Telehealth: Payer: Self-pay

## 2021-07-06 NOTE — Telephone Encounter (Signed)
-----   Message from Hoy Register, MD sent at 07/05/2021  8:44 AM EDT ----- Please inform her that she is severely anemic and needs to be compliant with her iron tablets.  I have referred her to GYN due to her menorrhagia.

## 2021-07-06 NOTE — Telephone Encounter (Signed)
Call placed to patient and message states that call can not be placed at this time.

## 2021-07-27 ENCOUNTER — Ambulatory Visit: Payer: Self-pay | Admitting: *Deleted

## 2021-07-27 NOTE — Telephone Encounter (Signed)
Patient is calling to report dental pain for 1 week- she states she is now have pain in her ear. Patient advised the importance of being seen to address her pain and possible infection- that can be dangerous. Patient states she does not have insurance for uC visit- advised mobile unit/ED.

## 2021-07-27 NOTE — Telephone Encounter (Signed)
Patient called stated that she have a tooth ache that causing pain in her ears. Have been in pain almost one week.  Given info on [facility] for tomorrow but states she has no ride to get there. Please advise  Ph# [phone]   Reason for Disposition  [1] SEVERE pain (e.g., excruciating, unable to do any normal activities) AND [2] not improved 2 hours after pain medicine  Answer Assessment - Initial Assessment Questions 1. LOCATION: "Which tooth is hurting?"  (e.g., right-side/left-side, upper/lower, front/back)     Left, upper, back 2. ONSET: "When did the toothache start?"  (e.g., hours, days)      1 week 3. SEVERITY: "How bad is the toothache?"  (Scale 1-10; mild, moderate or severe)   - MILD (1-3): doesn't interfere with chewing    - MODERATE (4-7): interferes with chewing, interferes with normal activities, awakens from sleep     - SEVERE (8-10): unable to eat, unable to do any normal activities, excruciating pain        moderate 4. SWELLING: "Is there any visible swelling of your face?"     No swelling 5. OTHER SYMPTOMS: "Do you have any other symptoms?" (e.g., fever)     Left ear pain- pain in all teeth and both ears 6. PREGNANCY: "Is there any chance you are pregnant?" "When was your last menstrual period?"     No- LMP--2 weeks ago  Protocols used: Toothache-A-AH

## 2021-07-28 NOTE — Telephone Encounter (Signed)
Please place on my schedule for a virtual visit

## 2021-07-30 ENCOUNTER — Telehealth: Payer: Self-pay | Admitting: Family Medicine

## 2021-07-30 NOTE — Telephone Encounter (Signed)
Copied from CRM [id number]. Topic: General - Other >> Jul 27, 2021  1:13 PM Jaquita Rector A wrote: Reason for CRM: Patient called in to inform dr Alvis Lemmings that she have a tooth ache that causing pain in her ears. Have been in pain almost one week. Would like an Rx sent to her pharmacy. Given info on [facility] for tomorrow but states she has no ride to get there. Please advise  Ph# [phone]

## 2021-07-31 ENCOUNTER — Telehealth (HOSPITAL_COMMUNITY): Payer: Self-pay | Admitting: *Deleted

## 2021-07-31 ENCOUNTER — Other Ambulatory Visit: Payer: Self-pay

## 2021-07-31 ENCOUNTER — Other Ambulatory Visit (HOSPITAL_COMMUNITY): Payer: Self-pay | Admitting: Psychiatry

## 2021-07-31 ENCOUNTER — Encounter (HOSPITAL_COMMUNITY): Payer: Self-pay | Admitting: Psychiatry

## 2021-07-31 ENCOUNTER — Telehealth (INDEPENDENT_AMBULATORY_CARE_PROVIDER_SITE_OTHER): Payer: No Payment, Other | Admitting: Psychiatry

## 2021-07-31 DIAGNOSIS — F900 Attention-deficit hyperactivity disorder, predominantly inattentive type: Secondary | ICD-10-CM | POA: Diagnosis not present

## 2021-07-31 DIAGNOSIS — F330 Major depressive disorder, recurrent, mild: Secondary | ICD-10-CM

## 2021-07-31 DIAGNOSIS — F411 Generalized anxiety disorder: Secondary | ICD-10-CM

## 2021-07-31 MED ORDER — AMPHETAMINE-DEXTROAMPHETAMINE 20 MG PO TABS: 20.0000 mg | ORAL_TABLET | Freq: Every day | ORAL | 0 refills | Status: DC

## 2021-07-31 MED ORDER — HYDROXYZINE HCL 10 MG PO TABS
10.0000 mg | ORAL_TABLET | Freq: Three times a day (TID) | ORAL | 2 refills | Status: DC | PRN
Start: 2021-07-31 — End: 2021-10-31
  Filled 2021-07-31 – 2021-10-09 (×2): qty 90, 30d supply, fill #0

## 2021-07-31 MED ORDER — FLUOXETINE HCL 20 MG PO TABS
40.0000 mg | ORAL_TABLET | Freq: Every day | ORAL | 2 refills | Status: DC
  Filled 2021-07-31: qty 60, 30d supply, fill #0

## 2021-07-31 MED ORDER — AMOXICILLIN 500 MG PO CAPS
500.0000 mg | ORAL_CAPSULE | Freq: Three times a day (TID) | ORAL | 0 refills | Status: AC
  Filled 2021-07-31 – 2021-08-14 (×2): qty 30, 10d supply, fill #0

## 2021-07-31 NOTE — Telephone Encounter (Signed)
Done
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
## 2021-07-31 NOTE — Telephone Encounter (Signed)
Called to ask if we would move her Adderall Rx to a new pharmacy, she would like it moved to [facility] on [address]. Will ask her provider to call in new rx to her prefferred pharmacy.

## 2021-07-31 NOTE — Telephone Encounter (Signed)
Medications moving to [facility] on [address].  Provider gave verbal order to nursing staff to call [facility] on [address] to discontinue prior order of Adderall.  Nursing staff confirmed that order was canceled.

## 2021-07-31 NOTE — Telephone Encounter (Signed)
Pt was called and she states that she is still having dental pain and is requesting antibiotic.

## 2021-08-01 ENCOUNTER — Other Ambulatory Visit: Payer: Self-pay

## 2021-08-01 NOTE — Telephone Encounter (Signed)
Pt was called and VM was left for patient.

## 2021-08-07 ENCOUNTER — Telehealth: Payer: Self-pay

## 2021-08-07 ENCOUNTER — Other Ambulatory Visit: Payer: Self-pay

## 2021-08-07 NOTE — Telephone Encounter (Signed)
Call placed to patient to discuss [employer] CPAP assistance program. Message left with call back requested to this CM  

## 2021-08-08 ENCOUNTER — Other Ambulatory Visit: Payer: Self-pay

## 2021-08-14 ENCOUNTER — Other Ambulatory Visit: Payer: Self-pay

## 2021-08-14 NOTE — Telephone Encounter (Signed)
Patient in need for CPAP machine and is uninsured.   Call placed to patient and informed her about the [employer] (ASAA) CPAP Assistance Program.  They provide refurbished machines to individuals for a $100 program fee. There is no warranty or technical support that comes with the machine, it is provided " as is."   The program currently reports a wait of about 3.5-4 months for a machine to be available for delivery. The patient is uninsured and unable to afford the program fee at this time. Informed her that [facility] will pay the program fee for her.  When she picks up the machine at [facility], she will be asked to sign a waiver of liability for the [employer]AA    She was in agreement with submitting  the order for the CPAP machine to [employer].  

## 2021-08-17 ENCOUNTER — Telehealth (HOSPITAL_BASED_OUTPATIENT_CLINIC_OR_DEPARTMENT_OTHER): Payer: Self-pay | Admitting: Family Medicine

## 2021-08-17 DIAGNOSIS — G4733 Obstructive sleep apnea (adult) (pediatric): Secondary | ICD-10-CM

## 2021-08-17 NOTE — Telephone Encounter (Signed)
She will need a new sleep study as the previous one revealed mild OSA with no equipment recommendations. I have placed order.Thanks

## 2021-08-22 ENCOUNTER — Ambulatory Visit (HOSPITAL_COMMUNITY): Payer: No Payment, Other | Admitting: Licensed Clinical Social Worker

## 2021-08-22 ENCOUNTER — Other Ambulatory Visit: Payer: Self-pay

## 2021-08-22 NOTE — Telephone Encounter (Signed)
Call placed to patient and informed her that Dr Alvis Lemmings is placing an order for a sleep study for her prior to ordering a CPAP machine. The sleep center will be contacting her about the date/time of the sleep study.  The patient said she understood.

## 2021-10-09 ENCOUNTER — Other Ambulatory Visit: Payer: Self-pay

## 2021-10-09 MED FILL — Omeprazole Cap Delayed Release 20 MG: ORAL | 30 days supply | Qty: 60 | Fill #1 | Status: AC

## 2021-10-10 ENCOUNTER — Other Ambulatory Visit: Payer: Self-pay

## 2021-10-11 ENCOUNTER — Other Ambulatory Visit: Payer: Self-pay

## 2021-10-12 ENCOUNTER — Other Ambulatory Visit: Payer: Self-pay

## 2021-10-15 ENCOUNTER — Other Ambulatory Visit: Payer: Self-pay

## 2021-10-15 ENCOUNTER — Ambulatory Visit (HOSPITAL_BASED_OUTPATIENT_CLINIC_OR_DEPARTMENT_OTHER): Payer: Self-pay | Attending: Family Medicine | Admitting: Internal Medicine

## 2021-10-15 VITALS — Ht 63.0 in | Wt >= 6400 oz

## 2021-10-15 DIAGNOSIS — G4736 Sleep related hypoventilation in conditions classified elsewhere: Secondary | ICD-10-CM | POA: Insufficient documentation

## 2021-10-15 DIAGNOSIS — G4733 Obstructive sleep apnea (adult) (pediatric): Secondary | ICD-10-CM

## 2021-10-21 DIAGNOSIS — G4733 Obstructive sleep apnea (adult) (pediatric): Secondary | ICD-10-CM

## 2021-10-21 NOTE — Procedures (Signed)
Patient Name: [name], Parkinson Study Date: 10/15/2021 Gender: Female D.O.B: 1991/02/01 Age (years): 30 Referring Provider: Jaclyn Shaggy Height (inches): 63 Interpreting Physician: Jetty Duhamel MD, ABSM Weight (lbs): 428 RPSGT: Lowry Ram BMI: 76 MRN: [id number] Neck Size: 17.00  CLINICAL INFORMATION Sleep Study Type: NPSG Indication for sleep study: Daytime Fatigue, Depression, Fatigue, Morbid Obesity, Parasomnias, Snoring Epworth Sleepiness Score: 18  SLEEP STUDY TECHNIQUE As per the AASM Manual for the Scoring of Sleep and Associated Events v2.3 (April 2016) with a hypopnea requiring 4% desaturations.  The channels recorded and monitored were frontal, central and occipital EEG, electrooculogram (EOG), submentalis EMG (chin), nasal and oral airflow, thoracic and abdominal wall motion, anterior tibialis EMG, snore microphone, electrocardiogram, and pulse oximetry.  MEDICATIONS Medications self-administered by patient taken the night of the study : N/A  SLEEP ARCHITECTURE The study was initiated at 10:42:15 PM and ended at 5:25:22 AM.  Sleep onset time was 34.5 minutes and the sleep efficiency was 58.2%. The total sleep time was 234.5 minutes.  Stage REM latency was 118.0 minutes.  The patient spent 23.03% of the night in stage N1 sleep, 60.34% in stage N2 sleep, 8.74% in stage N3 and 7.9% in REM.  Alpha intrusion was absent.  Supine sleep was 9.01%.  RESPIRATORY PARAMETERS The overall apnea/hypopnea index (AHI) was 6.7 per hour. There were 3 total apneas, including 1 obstructive, 2 central and 0 mixed apneas. There were 23 hypopneas and 35 RERAs.  The AHI during Stage REM sleep was 61.6 per hour.  AHI while supine was 34.1 per hour.  The mean oxygen saturation was 96.47%. The minimum SpO2 during sleep was 69.00%.  moderate snoring was noted during this study.  CARDIAC DATA The 2 lead EKG demonstrated sinus rhythm. The mean heart rate was 93.45  beats per minute. Other EKG findings include: Nonne.  LEG MOVEMENT DATA The total PLMS were 0 with a resulting PLMS index of 0.00. Associated arousal with leg movement index was 0.0 .  IMPRESSIONS - Mild obstructive sleep apnea occurred during this study (AHI = 6.7/h). - Patient had marked difficulty maintaining sl;eep with frequent arousals and awakenings. - Insufficient early sleep and events to meet protocol requirements for split CPAP titration. - No significant central sleep apnea occurred during this study (CAI = 0.5/h). - Severe oxygen desaturation was noted during this study (Min O2 = 69.00%). Mean saturation 96.4%. Time with saturation 88% or less was 4.2 minutes. - The patient snored with moderate snoring volume. - No cardiac abnormalities were noted during this study. - Clinically significant periodic limb movements did not occur during sleep. No significant associated arousals. - Bruxism and sleep talking incidentally noted.  DIAGNOSIS - Obstructive Sleep Apnea (G47.33) - Nocturnal Hypoxemia (G47.36)  RECOMMENDATIONS - This patient has had several previous sleep studies, all showing minimal OSA. CPAP titration to 11 cwp with residual AHI 3.2/ hr was recorded 02/10/12. NPSG 09/26/05 with minimally abnormal AHI of 5.3/ hr at body weight 320 lbs was followed by a Multiple Sleep Latency Test with mean sleep latency of 0 minutes and REM on 4 naps, consistent with a diagnosis of Narcolepsy. This patient may do best managed as Narcolepsy with minimal OSA. - Sleep hygiene should be reviewed to assess factors that may improve sleep quality. - Weight management and regular exercise should be initiated or continued if appropriate.  [Electronically signed] 10/21/2021 11:04 AM  Jetty Duhamel MD, ABSM Diplomate, American Board of Sleep Medicine   NPI: [id number]  Jetty Duhamel Diplomate, Biomedical engineer of Sleep Medicine  ELECTRONICALLY  SIGNED ON:  10/21/2021, 10:46 AM [facility] PH: [phone]   FX: [phone] ACCREDITED BY THE AMERICAN ACADEMY OF SLEEP MEDICINE

## 2021-10-22 ENCOUNTER — Other Ambulatory Visit (HOSPITAL_COMMUNITY): Payer: Self-pay | Admitting: Psychiatry

## 2021-10-22 ENCOUNTER — Telehealth (HOSPITAL_COMMUNITY): Payer: Self-pay | Admitting: *Deleted

## 2021-10-22 DIAGNOSIS — F900 Attention-deficit hyperactivity disorder, predominantly inattentive type: Secondary | ICD-10-CM

## 2021-10-22 MED ORDER — AMPHETAMINE-DEXTROAMPHETAMINE 20 MG PO TABS: 20.0000 mg | ORAL_TABLET | Freq: Every day | ORAL | 0 refills | Status: DC

## 2021-10-22 NOTE — Telephone Encounter (Signed)
Medication refilled and sent to preferred pharmacy

## 2021-10-22 NOTE — Telephone Encounter (Signed)
Patient called Requesting Refill on  amphetamine-dextroamphetamine (ADDERALL) 20 MG tablet.  Last seen on 8-16/22

## 2021-10-24 ENCOUNTER — Ambulatory Visit (HOSPITAL_COMMUNITY): Payer: No Payment, Other | Admitting: Licensed Clinical Social Worker

## 2021-10-31 ENCOUNTER — Ambulatory Visit (INDEPENDENT_AMBULATORY_CARE_PROVIDER_SITE_OTHER): Payer: No Payment, Other | Admitting: Licensed Clinical Social Worker

## 2021-10-31 ENCOUNTER — Other Ambulatory Visit: Payer: Self-pay

## 2021-10-31 ENCOUNTER — Telehealth (INDEPENDENT_AMBULATORY_CARE_PROVIDER_SITE_OTHER): Payer: No Payment, Other | Admitting: Psychiatry

## 2021-10-31 ENCOUNTER — Encounter (HOSPITAL_COMMUNITY): Payer: Self-pay | Admitting: Psychiatry

## 2021-10-31 DIAGNOSIS — F322 Major depressive disorder, single episode, severe without psychotic features: Secondary | ICD-10-CM

## 2021-10-31 DIAGNOSIS — F411 Generalized anxiety disorder: Secondary | ICD-10-CM

## 2021-10-31 DIAGNOSIS — F330 Major depressive disorder, recurrent, mild: Secondary | ICD-10-CM

## 2021-10-31 DIAGNOSIS — F900 Attention-deficit hyperactivity disorder, predominantly inattentive type: Secondary | ICD-10-CM

## 2021-10-31 MED ORDER — HYDROXYZINE HCL 10 MG PO TABS
10.0000 mg | ORAL_TABLET | Freq: Three times a day (TID) | ORAL | 3 refills | Status: DC | PRN
  Filled 2021-10-31 – 2021-12-17 (×2): qty 90, 30d supply, fill #0

## 2021-10-31 MED ORDER — FLUOXETINE HCL 20 MG PO TABS
40.0000 mg | ORAL_TABLET | Freq: Every day | ORAL | 3 refills | Status: DC
  Filled 2021-10-31 – 2021-12-17 (×3): qty 60, 30d supply, fill #0

## 2021-10-31 NOTE — Plan of Care (Signed)
Pt agreeable to plan  ?
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
## 2021-11-07 ENCOUNTER — Other Ambulatory Visit: Payer: Self-pay

## 2021-11-23 ENCOUNTER — Telehealth (HOSPITAL_COMMUNITY): Payer: Self-pay | Admitting: *Deleted

## 2021-11-23 NOTE — Telephone Encounter (Signed)
Call from patient at 339 pm on fri to request her rx for her Adderall be called in. Dr Doyne Keel is her provider and she has left for the day. Told patient the provider would address the request on Monday when she returns to work. She should be out of her Adderall. Will forward message to Dr Doyne Keel.

## 2021-11-26 ENCOUNTER — Other Ambulatory Visit: Payer: Self-pay | Admitting: Physician Assistant

## 2021-11-26 ENCOUNTER — Other Ambulatory Visit: Payer: Self-pay

## 2021-11-26 ENCOUNTER — Other Ambulatory Visit (HOSPITAL_COMMUNITY): Payer: Self-pay | Admitting: Psychiatry

## 2021-11-26 DIAGNOSIS — R202 Paresthesia of skin: Secondary | ICD-10-CM

## 2021-11-26 DIAGNOSIS — F900 Attention-deficit hyperactivity disorder, predominantly inattentive type: Secondary | ICD-10-CM

## 2021-11-26 DIAGNOSIS — G8929 Other chronic pain: Secondary | ICD-10-CM

## 2021-11-26 DIAGNOSIS — M25562 Pain in left knee: Secondary | ICD-10-CM

## 2021-11-26 MED ORDER — AMPHETAMINE-DEXTROAMPHETAMINE 20 MG PO TABS: 20.0000 mg | ORAL_TABLET | Freq: Every day | ORAL | 0 refills | Status: DC

## 2021-11-26 MED ORDER — GABAPENTIN 300 MG PO CAPS
300.0000 mg | ORAL_CAPSULE | Freq: Three times a day (TID) | ORAL | 0 refills | Status: DC
  Filled 2021-11-26 – 2022-07-02 (×3): qty 90, 30d supply, fill #0

## 2021-11-26 MED FILL — Omeprazole Cap Delayed Release 20 MG: ORAL | 30 days supply | Qty: 60 | Fill #2 | Status: CN

## 2021-11-26 MED FILL — Ferrous Sulfate Tab 325 MG (65 MG Elemental Fe): ORAL | 30 days supply | Qty: 60 | Fill #0 | Status: CN

## 2021-11-26 NOTE — Telephone Encounter (Signed)
Requested Prescriptions  Pending Prescriptions Disp Refills  . gabapentin (NEURONTIN) 300 MG capsule 90 capsule 0    Sig: Take 1 capsule (300 mg total) by mouth 3 (three) times daily.     Neurology: Anticonvulsants - gabapentin Passed - 11/26/2021  9:22 AM      Passed - Valid encounter within last 12 months    Recent Outpatient Visits          4 months ago Microcytic anemia   [facility] Hoy Register, MD   10 months ago Hyperglycemia   [facility] [place], [place], [place]   3 years ago Rash   [facility] [place], [place], [place]   3 years ago Chronic migraine without aura without status migrainosus, not intractable   [facility] Hoy Register, MD   3 years ago Chronic pain of left knee   [facility] Hoy Register, MD

## 2021-11-26 NOTE — Telephone Encounter (Signed)
Medication refilled and sent to preferred pharmacy

## 2021-12-03 ENCOUNTER — Other Ambulatory Visit: Payer: Self-pay

## 2021-12-05 ENCOUNTER — Other Ambulatory Visit: Payer: Self-pay

## 2021-12-05 MED FILL — Omeprazole Cap Delayed Release 20 MG: ORAL | 30 days supply | Qty: 60 | Fill #2 | Status: AC

## 2021-12-06 ENCOUNTER — Other Ambulatory Visit: Payer: Self-pay

## 2021-12-17 ENCOUNTER — Other Ambulatory Visit: Payer: Self-pay

## 2021-12-27 ENCOUNTER — Ambulatory Visit (INDEPENDENT_AMBULATORY_CARE_PROVIDER_SITE_OTHER): Payer: No Payment, Other | Admitting: Licensed Clinical Social Worker

## 2021-12-27 DIAGNOSIS — F900 Attention-deficit hyperactivity disorder, predominantly inattentive type: Secondary | ICD-10-CM

## 2021-12-27 DIAGNOSIS — F411 Generalized anxiety disorder: Secondary | ICD-10-CM

## 2021-12-27 DIAGNOSIS — F322 Major depressive disorder, single episode, severe without psychotic features: Secondary | ICD-10-CM
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
## 2022-01-10 ENCOUNTER — Encounter: Payer: Medicaid Other | Admitting: Obstetrics and Gynecology

## 2022-01-18 ENCOUNTER — Other Ambulatory Visit: Payer: Self-pay | Admitting: Family Medicine

## 2022-01-18 ENCOUNTER — Other Ambulatory Visit: Payer: Self-pay

## 2022-01-18 MED ORDER — OMEPRAZOLE 20 MG PO CPDR
DELAYED_RELEASE_CAPSULE | Freq: Two times a day (BID) | ORAL | 2 refills | Status: DC
  Filled 2022-01-18: qty 60, fill #0

## 2022-01-18 NOTE — Telephone Encounter (Signed)
Requested Prescriptions  Pending Prescriptions Disp Refills   omeprazole (PRILOSEC) 20 MG capsule 60 capsule 3    Sig: TAKE 1 CAPSULE (20 MG TOTAL) BY MOUTH 2 (TWO) TIMES DAILY BEFORE A MEAL.     Gastroenterology: Proton Pump Inhibitors Passed - 01/18/2022  4:37 PM      Passed - Valid encounter within last 12 months    Recent Outpatient Visits          6 months ago Microcytic anemia   [facility] Hoy Register, MD   1 year ago Hyperglycemia   [facility] [place], [place], [place]   3 years ago Rash   [facility] [place], [place], [place]   3 years ago Chronic migraine without aura without status migrainosus, not intractable   [facility] Hoy Register, MD   3 years ago Chronic pain of left knee   [facility] Hoy Register, MD

## 2022-01-21 ENCOUNTER — Other Ambulatory Visit: Payer: Self-pay

## 2022-01-24 ENCOUNTER — Encounter (HOSPITAL_COMMUNITY): Payer: Self-pay | Admitting: Psychiatry

## 2022-01-24 ENCOUNTER — Telehealth (INDEPENDENT_AMBULATORY_CARE_PROVIDER_SITE_OTHER): Payer: No Payment, Other | Admitting: Psychiatry

## 2022-01-24 ENCOUNTER — Other Ambulatory Visit: Payer: Self-pay

## 2022-01-24 DIAGNOSIS — F330 Major depressive disorder, recurrent, mild: Secondary | ICD-10-CM

## 2022-01-24 DIAGNOSIS — F411 Generalized anxiety disorder: Secondary | ICD-10-CM

## 2022-01-24 DIAGNOSIS — F900 Attention-deficit hyperactivity disorder, predominantly inattentive type: Secondary | ICD-10-CM | POA: Diagnosis not present

## 2022-01-24 MED ORDER — FLUOXETINE HCL 20 MG PO TABS: 40.0000 mg | ORAL_TABLET | Freq: Every day | ORAL | 3 refills | Status: DC

## 2022-01-24 MED ORDER — AMPHETAMINE-DEXTROAMPHETAMINE 20 MG PO TABS: 20.0000 mg | ORAL_TABLET | Freq: Every day | ORAL | 0 refills | Status: DC

## 2022-01-24 MED ORDER — HYDROXYZINE HCL 10 MG PO TABS: 10.0000 mg | ORAL_TABLET | Freq: Three times a day (TID) | ORAL | 3 refills | Status: DC | PRN
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
## 2022-01-25 ENCOUNTER — Other Ambulatory Visit (HOSPITAL_COMMUNITY): Payer: Self-pay | Admitting: Psychiatry

## 2022-01-25 DIAGNOSIS — F900 Attention-deficit hyperactivity disorder, predominantly inattentive type: Secondary | ICD-10-CM

## 2022-01-25 DIAGNOSIS — F411 Generalized anxiety disorder: Secondary | ICD-10-CM

## 2022-01-25 DIAGNOSIS — F330 Major depressive disorder, recurrent, mild: Secondary | ICD-10-CM

## 2022-01-25 MED ORDER — AMPHETAMINE-DEXTROAMPHETAMINE 20 MG PO TABS: 20.0000 mg | ORAL_TABLET | Freq: Every day | ORAL | 0 refills | Status: DC

## 2022-01-25 MED ORDER — FLUOXETINE HCL 20 MG PO TABS: 40.0000 mg | ORAL_TABLET | Freq: Every day | ORAL | 3 refills | Status: DC

## 2022-01-25 MED ORDER — HYDROXYZINE HCL 10 MG PO TABS: 10.0000 mg | ORAL_TABLET | Freq: Three times a day (TID) | ORAL | 3 refills | Status: DC | PRN

## 2022-01-29 ENCOUNTER — Other Ambulatory Visit: Payer: Self-pay

## 2022-01-29 ENCOUNTER — Ambulatory Visit (INDEPENDENT_AMBULATORY_CARE_PROVIDER_SITE_OTHER): Payer: No Payment, Other | Admitting: Licensed Clinical Social Worker

## 2022-01-29 DIAGNOSIS — F900 Attention-deficit hyperactivity disorder, predominantly inattentive type: Secondary | ICD-10-CM | POA: Diagnosis not present

## 2022-01-29 DIAGNOSIS — F322 Major depressive disorder, single episode, severe without psychotic features: Secondary | ICD-10-CM

## 2022-01-29 DIAGNOSIS — F411 Generalized anxiety disorder: Secondary | ICD-10-CM

## 2022-01-29 NOTE — Progress Notes (Signed)
THERAPIST PROGRESS NOTE  Virtual Visit via Video Note  I connected with Zosia A Broecker on 01/30/22 at  4:00 PM EST by a video enabled telemedicine application and verified that I am speaking with the correct person using two identifiers.  Location: Patient: [facility]  Provider: Providers Home    I discussed the limitations of evaluation and management by telemedicine and the availability of in person appointments. The patient expressed understanding and agreed to proceed.     I discussed the assessment and treatment plan with the patient. The patient was provided an opportunity to ask questions and all were answered. The patient agreed with the plan and demonstrated an understanding of the instructions.   The patient was advised to call back or seek an in-person evaluation if the symptoms worsen or if the condition fails to improve as anticipated.  I provided 40 minutes of non-face-to-face time during this encounter.   Weber Cooks, LCSW   Participation Level: Active  Behavioral Response: CasualAlertAnxious, Depressed, Hopeless, Irritable, and Worthless  Type of Therapy: Individual Therapy  Treatment Goals addressed: Decrease of PHQ-9 and GAD-7   ProgressTowards Goals: Not Progressing  Interventions: Motivational Interviewing and Supportive  Summary: Tameah A Golightly is a 31 y.o. female who presents with depressed, flat, anxious mood\affect.  Patient was engaged minimally, irritable, resistant to treatment today.  [name] was alert and oriented x5.  She was dressed casually in today's session.  Patient came in today with TV on so loud that LCSW was having trouble hearing her.  LCSW asked her to turn down patient stated "hang on".  Silence went on for over 1 minute after the TV had gone down and patient did not reengage into therapy.  Eventually therapist spoke up and patient was irritable that therapist was not engaging.  LCSW tried to move on from this  with suggesting talking about stressors in patient's life such as disability application, family conflict, and basic ADL and chores.  Patient was resistant to engaging in any of those things.  LCSW would like to note that patient engaged well with trauma informed therapy at last session.  When LCSW brought this up again patient reports that was already touched on and she did not want to reengage in that topic.  LCSW went on to talk about family conflict with mother.  Patient is currently living with her sister due to her mom and patient's tension.  Patient reports that her mother has high expectations for her to clean up after her brothers and sisters and she states "I have enough trouble taking care of myself".  LCSW inquired why she has trouble taking care of her self patient reports "because of my ADHD."  LCSW inquired about barriers and possibly coming up with routines for daily chores as patient notes that she was struggling with basics such as cleaning up after herself.  Patient stated "I did not say I cannot do it I said that I struggle with other people doing it.".  LCSW asked if this was something that she wanted to work on and patient stated now.  LCSW spoke about financials such as applying for disability and patient states "I plan on doing it but I just have not because of staying with my sister". LCSW asked is if there is anything else that she wanted to talk about in today's session.  Patient reports "usually I think of things about 5 minutes after session".  Suicidal/Homicidal: Nowithout intent/plan  Therapist Response:   Intervention/Plan:  Interventions utilized in today's session were CBT, supportive, and solution focused.  LCSW did note to patient that engaging minimally could potentially be a barrier moving forward.  LCSW did note to patient that an evaluation would be conducted in her next 2 sessions of her engagement both in April and in May.  LCSW suggested that in next session she bring  in a list of things that she would like to work on, noting the struggles with engagement as the reason for this request.  LCSW requested 3 topics.  Patient stated that she could do it.  LCSW used language for support as praise, encouragement, and empowerment.  LCSW attempted to use person centered therapy for being genuine and setting clear-cut boundaries.  LCSW administered a GAD-7 and PHQ-9 and patient scored a 19 and 20 on those assessments.  Results are recorded and in the chart.  Plan for patient is to bring in 3 topics to talk about next time and to continue to try to decrease PHQ-9 and GAD-7.  Plan: Return again in 4 weeks.  Diagnosis: Attention deficit hyperactivity disorder (ADHD), predominantly inattentive type  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 prior episode (HCC)  Generalized anxiety disorder    Patient/Guardian was advised Release of Information must be obtained prior to any record release in order to collaborate their care with an outside provider. Patient/Guardian was advised if they have not already done so to contact the registration department to sign all necessary forms in order for [place] to release information regarding their care.   Consent: Patient/Guardian gives verbal consent for treatment and assignment of benefits for services provided during this telehealth visit. Patient/Guardian expressed understanding and agreed to proceed.   Weber Cooks, LCSW 01/30/2022

## 2022-01-31 ENCOUNTER — Other Ambulatory Visit: Payer: Self-pay | Admitting: Family Medicine

## 2022-01-31 ENCOUNTER — Telehealth (HOSPITAL_COMMUNITY): Payer: Self-pay

## 2022-01-31 DIAGNOSIS — K219 Gastro-esophageal reflux disease without esophagitis: Secondary | ICD-10-CM

## 2022-01-31 NOTE — Telephone Encounter (Signed)
Medication Refill - Medication:  omeprazole (PRILOSEC) 20 MG capsule   Has the patient contacted their pharmacy? Yes.   Not available at the other pharmacy, so requesting to send it to [facility]  Preferred Pharmacy (with phone number or street name):  [id number], [place] - [address] Phone:  [phone]  Fax:  [phone]     Has the patient been seen for an appointment in the last year OR does the patient have an upcoming appointment? Yes.    Agent: Please be advised that RX refills may take up to 3 business days. We ask that you follow-up with your pharmacy.

## 2022-01-31 NOTE — Telephone Encounter (Signed)
Patient called and requested that her Adderall 20mg  be resent to on [address] [place] in [place], [place]. She stated that she's not in [place] to get it and that the [facility] in [place] where it was originally sent is too expensive. Please review and advise. Thank you

## 2022-02-01 MED ORDER — OMEPRAZOLE 20 MG PO CPDR: 20.0000 mg | DELAYED_RELEASE_CAPSULE | Freq: Two times a day (BID) | ORAL | 0 refills | Status: DC

## 2022-02-01 NOTE — Telephone Encounter (Signed)
Requested medication (s) are due for refill today - no  Requested medication (s) are on the active medication list -yes  Future visit scheduled -no  Last refill: 01/19/20 #60 2RF- patient requesting different pharmacy  Notes to clinic: Attempted to call patient- she is overdue follow up in office- no answer- VM not set up. [place]- Rx request sent to office for RF review   Requested Prescriptions  Pending Prescriptions Disp Refills   omeprazole (PRILOSEC) 20 MG capsule 60 capsule 3    Sig: Take 1 capsule (20 mg total) by mouth 2 (two) times daily before a meal.     Gastroenterology: Proton Pump Inhibitors Passed - 01/31/2022  1:46 PM      Passed - Valid encounter within last 12 months    Recent Outpatient Visits           7 months ago Microcytic anemia   [facility] [place], Odette Horns, [place]   1 year ago Hyperglycemia   [facility] [place], [place], [place]   3 years ago Rash   [facility] [place], [place], [place]   3 years ago Chronic migraine without aura without status migrainosus, not intractable   [facility] [place], [place], [place]   3 years ago Chronic pain of left knee   [facility] [place], Odette Horns, [place]                 Requested Prescriptions  Pending Prescriptions Disp Refills   omeprazole (PRILOSEC) 20 MG capsule 60 capsule 3    Sig: Take 1 capsule (20 mg total) by mouth 2 (two) times daily before a meal.     Gastroenterology: Proton Pump Inhibitors Passed - 01/31/2022  1:46 PM      Passed - Valid encounter within last 12 months    Recent Outpatient Visits           7 months ago Microcytic anemia   [facility] [place] Register, [place]   1 year ago Hyperglycemia   [facility] [place], [place], [place]   3 years ago Rash   [facility] [place], [place], [place]   3 years ago Chronic migraine without aura without status migrainosus, not intractable   [facility] [place], Odette Horns, [place]   3 years ago Chronic pain of left knee   [facility] [place]oy Register, [place]

## 2022-02-04 NOTE — Telephone Encounter (Signed)
Provider attempted to call patient 3 times without success.  Adderall not sent to [place] at this time.  Patient can call the clinic for further details if needed.  She is returning to [place].  Adderall may need to be refilled when patient returns back to [place].

## 2022-02-19 ENCOUNTER — Ambulatory Visit: Payer: Self-pay

## 2022-02-19 NOTE — Telephone Encounter (Signed)
? ?  Chief Complaint: Tooth ache - infection ?Symptoms: pain, some swelling, unknown if fever ?Frequency: awhile ?Pertinent Negatives: Patient denies na ?Disposition: [] ED /[] Urgent Care (no appt availability in office) / [] Appointment(In office/virtual)/ []  [place] [facility]/ [] Home Care/ [] Refused Recommended Disposition /[] [place] [facility]/ []  Follow-up with PCP ?Additional Notes: Pt is currently in [place]. Pt states she cannot see dentist as she has no insurance, and that a dentist won't see her with an infection. PT is requesting antibiotics be called in for her.  ? ?Summary: toothe infection / antibiotic  ? Pt called to see if Dr. Margarita Rana would send her in an RX for an antibiotic for her toothache / pt stated her tooth is infected / please advise   ?  ? ?Reason for Disposition ? Toothache present > 24 hours ? ?Answer Assessment - Initial Assessment Questions ?1. LOCATION: "Which tooth is hurting?"  (e.g., right-side/left-side, upper/lower, front/back) ?    Back top left side ?2. ONSET: "When did the toothache start?"  (e.g., hours, days)  ?    Weeks ago ?3. SEVERITY: "How bad is the toothache?"  (Scale 1-10; mild, moderate or severe) ?  - MILD (1-3): doesn't interfere with chewing  ?  - MODERATE (4-7): interferes with chewing, interferes with normal activities, awakens from sleep   ?  - SEVERE (8-10): unable to eat, unable to do any normal activities, excruciating pain    ?    10/10 ?4. SWELLING: "Is there any visible swelling of your face?" ?    no ?5. OTHER SYMPTOMS: "Do you have any other symptoms?" (e.g., fever) ?    Nausea, unable to eat. ?6. PREGNANCY: "Is there any chance you are pregnant?" "When was your last menstrual period?" ?    na ? ?Protocols used: Toothache-A-AH ? ?

## 2022-02-19 NOTE — Telephone Encounter (Signed)
Apt schedule with [name] for virtual appt. On Thurs. 3/9 at 1040 ? ?

## 2022-02-21 ENCOUNTER — Other Ambulatory Visit: Payer: Self-pay

## 2022-02-21 ENCOUNTER — Encounter: Payer: Self-pay | Admitting: Nurse Practitioner

## 2022-02-21 ENCOUNTER — Telehealth (INDEPENDENT_AMBULATORY_CARE_PROVIDER_SITE_OTHER): Payer: Self-pay | Admitting: Nurse Practitioner

## 2022-02-21 DIAGNOSIS — K047 Periapical abscess without sinus: Secondary | ICD-10-CM | POA: Insufficient documentation

## 2022-02-21 MED ORDER — AMOXICILLIN 875 MG PO TABS: 875.0000 mg | ORAL_TABLET | Freq: Two times a day (BID) | ORAL | 0 refills | Status: AC

## 2022-02-21 NOTE — Assessment & Plan Note (Signed)
-   amoxicillin (AMOXIL) 875 MG tablet; Take 1 tablet (875 mg total) by mouth 2 (two) times daily for 10 days.  Dispense: 20 tablet; Refill: 0 ? ?Follow up: ? ?Follow up with dentist ASAP ?

## 2022-02-21 NOTE — Patient Instructions (Signed)
1. Dental abscess ? ?- amoxicillin (AMOXIL) 875 MG tablet; Take 1 tablet (875 mg total) by mouth 2 (two) times daily for 10 days.  Dispense: 20 tablet; Refill: 0 ? ?Follow up: ? ?Follow up with dentist ASAP ?

## 2022-02-21 NOTE — Progress Notes (Signed)
Virtual Visit via Video Note ? ?I connected with [name] on 02/21/22 at 10:40 AM EST by a video enabled telemedicine application and verified that I am speaking with the correct person using two identifiers. ? ?Location: ?Patient: home ?Provider: office ?  ?I discussed the limitations of evaluation and management by telemedicine and the availability of in person appointments. The patient expressed understanding and agreed to proceed. ? ?History of Present Illness: ? ?Patient presents today through video visit for dental abscess.  She states that her upper back left tooth has been bothering her for several days now.  She states that she does have swelling and redness around the gum and to the left side of her face.  We discussed that she does need to follow-up with a dentist for this issue.  Patient is currently in [place] with her sister. Denies f/c/s, n/v/d, hemoptysis, PND, chest pain or edema. ? ? ?  ?Observations/Objective: ? ?Vitals with BMI 10/15/2021 01/17/2021 09/15/2018  ?Height 5\' 3"  5\' 3"  5\' 3"   ?Weight 428 lbs 408 lbs 417 lbs 10 oz  ?BMI 75.84 72.29 73.99  ?Systolic - 136 107  ?Diastolic - 56 72  ?Pulse - 109 74  ? ? ? ? ?Assessment and Plan: ? ? ?Dental abscess ?- amoxicillin (AMOXIL) 875 MG tablet; Take 1 tablet (875 mg total) by mouth 2 (two) times daily for 10 days.  Dispense: 20 tablet; Refill: 0 ? ?Follow up: ? ?Follow up with dentist ASAP ? ? ?  ?I discussed the assessment and treatment plan with the patient. The patient was provided an opportunity to ask questions and all were answered. The patient agreed with the plan and demonstrated an understanding of the instructions. ?  ?The patient was advised to call back or seek an in-person evaluation if the symptoms worsen or if the condition fails to improve as anticipated. ? ?I provided 22 minutes of non-face-to-face time during this encounter. ? ? ? , NP ? ?

## 2022-03-02 ENCOUNTER — Other Ambulatory Visit: Payer: Self-pay | Admitting: Family Medicine

## 2022-03-02 DIAGNOSIS — K219 Gastro-esophageal reflux disease without esophagitis: Secondary | ICD-10-CM

## 2022-03-04 NOTE — Telephone Encounter (Signed)
Requested Prescriptions  ?Pending Prescriptions Disp Refills  ?? omeprazole (PRILOSEC) 20 MG capsule [Pharmacy Med Name: Omeprazole Dr 20 Mg Cap Nort] 60 capsule 0  ?  Sig: Take 1 capsule (20 mg total) by mouth 2 (two) times daily before a meal.  ?  ? Gastroenterology: Proton Pump Inhibitors Passed - 03/02/2022  1:30 AM  ?  ?  Passed - Valid encounter within last 12 months  ?  Recent Outpatient Visits   ?      ? 1 week ago Dental abscess  ? [facility] at [facility], Gildardo Pounds, NP  ? 8 months ago Microcytic anemia  ? [facility] Hoy Register, MD  ? 1 year ago Hyperglycemia  ? [facility] [place], [place], [place]  ? 3 years ago Rash  ? [facility] [place], Odette Horns, MD  ? 3 years ago Chronic migraine without aura without status migrainosus, not intractable  ? [facility] And Wellness Hoy Register, MD  ?  ?  ? ?  ?  ?  ? ?

## 2022-03-05 ENCOUNTER — Other Ambulatory Visit: Payer: Self-pay | Admitting: Family Medicine

## 2022-03-05 DIAGNOSIS — K219 Gastro-esophageal reflux disease without esophagitis: Secondary | ICD-10-CM

## 2022-03-05 MED ORDER — OMEPRAZOLE 20 MG PO CPDR: 20.0000 mg | DELAYED_RELEASE_CAPSULE | Freq: Two times a day (BID) | ORAL | 0 refills | Status: DC

## 2022-03-15 ENCOUNTER — Telehealth (HOSPITAL_COMMUNITY): Payer: Self-pay | Admitting: *Deleted

## 2022-03-15 NOTE — Telephone Encounter (Signed)
Called to request her Adderall be called in to her pharmacy. Gave me the address to her pharmacy which was in [place]. I told her we were unable to send a controlled drug rx out of this state and she is not our patient if she is no longer a [facility] resident. She states [place] is a short term arrangement. Chart reviewed and she was in [place] two months ago. Will not forward rx ?

## 2022-03-18 ENCOUNTER — Ambulatory Visit (INDEPENDENT_AMBULATORY_CARE_PROVIDER_SITE_OTHER): Payer: No Payment, Other | Admitting: Licensed Clinical Social Worker

## 2022-03-18 DIAGNOSIS — F900 Attention-deficit hyperactivity disorder, predominantly inattentive type: Secondary | ICD-10-CM

## 2022-03-18 DIAGNOSIS — F411 Generalized anxiety disorder: Secondary | ICD-10-CM

## 2022-03-18 DIAGNOSIS — F330 Major depressive disorder, recurrent, mild: Secondary | ICD-10-CM

## 2022-03-18 NOTE — Plan of Care (Signed)
?  Problem: Depression CCP Problem  1 MDD  ?Goal: walk 2 x weekly  ?Outcome: Not Progressing ?Goal: Journal 1 x weekly ?Outcome: Not Progressing ?Goal: LTG: [name] WILL SCORE LESS THAN 10 ON THE PATIENT HEALTH QUESTIONNAIRE (PHQ-9) ?Outcome: Not Applicable ?Goal: STG: [name] WILL PARTICIPATE IN AT LEAST 80% OF SCHEDULED INDIVIDUAL PSYCHOTHERAPY SESSIONS ?Outcome: Progressing ?Goal: STG: [name] WILL IDENTIFY 3 COGNITIVE PATTERNS AND BELIEFS THAT SUPPORT DEPRESSION ?Outcome: Not Progressing ?  ?

## 2022-03-18 NOTE — Progress Notes (Signed)
? ?  THERAPIST PROGRESS NOTE ? ?Virtual Visit via Video Note ? ?I connected with [name] on 03/18/22 at  2:00 PM EDT by a video enabled telemedicine application and verified that I am speaking with the correct person using two identifiers. ? ?Location: ?Patient: [name]  ?Provider: Providers Home office  ?  ?I discussed the limitations of evaluation and management by telemedicine and the availability of in person appointments. The patient expressed understanding and agreed to proceed. ? ? ?  ?I discussed the assessment and treatment plan with the patient. The patient was provided an opportunity to ask questions and all were answered. The patient agreed with the plan and demonstrated an understanding of the instructions. ?  ?The patient was advised to call back or seek an in-person evaluation if the symptoms worsen or if the condition fails to improve as anticipated. ? ?I provided 30 minutes of non-face-to-face time during this encounter. ? ? ?[name] Horn, LCSW  ? ?Participation Level: Active ? ?Behavioral Response: CasualAlertAnxious and Depressed ? ?Type of Therapy: Individual Therapy ? ?Treatment Goals addressed: STG: [name] WILL PARTICIPATE IN AT LEAST 80% OF SCHEDULED INDIVIDUAL PSYCHOTHERAPY  ?SESSIONS ? ?ProgressTowards Goals: Progressing ? ?Interventions: Solution Focused, Supportive, and Reframing ? ? ?Suicidal/Homicidal: Yeswithout intent/plan ? ?Therapist Response:  ? ? ?Pt was alert and oriented x 5. She was dressed casually and engaged minimally in session as evidence by short 1-to-4-word answers for open ended questions. Pt was irritable, cooperative, and maintained good eye contact. She presented with flat and irritable mood/affect.  ? Pt showed up 15 minutes late for session. Pt has been asked before to be on time for appointments. LCSW explained that she has history of no shows three for therapy alone with various providers at [name]. Administrator  messaged this LCSW asking to confirm pt location. LCSW asked pt and pt confirmed she was still at her sisters, LCSW did ask where her sisters was located, and she stated [place]. LCSW explained that licensure for this LCSW only covers the state of [name]. Pt told that she needs to return to [name] and then f/u for further appointments. Pt was agreeable. LCSW did speak with front desk administration staff to confirm cancellation of appointments. Pt to f/u upon return to [name]   ? ?  ?Plan: Pt to reschedule appointments with [name] once she returns to [name]. Pt has been in [place] for last 3 months.  ? ?Diagnosis: Attention deficit hyperactivity disorder (ADHD), predominantly inattentive type ? ?Depression, major, recurrent, mild ([name]) ? ?Generalized anxiety disorder ? ?Collaboration of Care: Other LCSW attempted to provide pt with resources to in [place] but pt did not want them as she states she has not moved she is only visiting, even though she does admit to being there for the last 90 days. ? ?Patient/Guardian was advised Release of Information must be obtained prior to any record release in order to collaborate their care with an outside provider. Patient/Guardian was advised if they have not already done so to contact the registration department to sign all necessary forms in order for [place] to release information regarding their care.  ? ?Consent: Patient/Guardian gives verbal consent for treatment and assignment of benefits for services provided during this visit. Patient/Guardian expressed understanding and agreed to proceed.  ? ?[name] Horn, LCSW ?03/18/2022 ? ?

## 2022-04-01 ENCOUNTER — Telehealth (HOSPITAL_COMMUNITY): Payer: Self-pay | Admitting: *Deleted

## 2022-04-01 NOTE — Telephone Encounter (Signed)
Patient called requested refill  amphetamine-dextroamphetamine (ADDERALL) 20 MG tablet ?Take 1 tablet (20 mg total) by mouth daily  ? ?SEND TO RX: ?[facility] [address] [place], [place] - [address] AT [facility] [address] ? ?

## 2022-04-02 ENCOUNTER — Telehealth (HOSPITAL_COMMUNITY): Payer: Self-pay | Admitting: *Deleted

## 2022-04-02 NOTE — Telephone Encounter (Signed)
Appt needed for refill on amphetamine-dextroamphetamine (ADDERALL) 20 MG tablet ? ?Per provider Mcneil Sober --LVM to inform patient  ?

## 2022-04-10 ENCOUNTER — Telehealth (HOSPITAL_COMMUNITY): Payer: No Payment, Other | Admitting: Psychiatry

## 2022-04-13 ENCOUNTER — Other Ambulatory Visit: Payer: Self-pay | Admitting: Family Medicine

## 2022-04-13 DIAGNOSIS — K219 Gastro-esophageal reflux disease without esophagitis: Secondary | ICD-10-CM

## 2022-04-15 NOTE — Telephone Encounter (Signed)
Requested Prescriptions  ?Pending Prescriptions Disp Refills  ?? omeprazole (PRILOSEC) 20 MG capsule [Pharmacy Med Name: Omeprazole Dr 20 Mg Cap Nort] 60 capsule 0  ?  Sig: Take 1 capsule (20 mg total) by mouth 2 (two) times daily before a meal.  ?  ? Gastroenterology: Proton Pump Inhibitors Passed - 04/13/2022  3:00 AM  ?  ?  Passed - Valid encounter within last 12 months  ?  Recent Outpatient Visits   ?      ? 1 month ago Dental abscess  ? [facility] at [facility], Gildardo Pounds, NP  ? 9 months ago Microcytic anemia  ? [facility] And Wellness Hoy Register, [place]  ? 1 year ago Hyperglycemia  ? [facility] [place], [place], [place]  ? 3 years ago Rash  ? [facility] [place], Odette Horns, [place]  ? 3 years ago Chronic migraine without aura without status migrainosus, not intractable  ? [facility] And Wellness Hoy Register, [place]  ?  ?  ? ?  ?  ?  ? ?

## 2022-04-16 ENCOUNTER — Encounter (HOSPITAL_COMMUNITY): Payer: Self-pay

## 2022-04-16 ENCOUNTER — Telehealth (HOSPITAL_COMMUNITY): Payer: Commercial Managed Care - HMO | Admitting: Psychiatry

## 2022-04-18 ENCOUNTER — Ambulatory Visit (HOSPITAL_COMMUNITY): Payer: No Payment, Other | Admitting: Licensed Clinical Social Worker

## 2022-06-28 ENCOUNTER — Other Ambulatory Visit: Payer: Self-pay | Admitting: Family Medicine

## 2022-06-28 DIAGNOSIS — K219 Gastro-esophageal reflux disease without esophagitis: Secondary | ICD-10-CM

## 2022-06-28 NOTE — Telephone Encounter (Signed)
Requested Prescriptions  Pending Prescriptions Disp Refills  . omeprazole (PRILOSEC) 20 MG capsule [Pharmacy Med Name: Omeprazole Dr 20 Mg Cap Nort] 60 capsule 0    Sig: Take 1 capsule (20 mg total) by mouth 2 (two) times daily before a meal.     Gastroenterology: Proton Pump Inhibitors Passed - 06/28/2022  1:30 AM      Passed - Valid encounter within last 12 months    Recent Outpatient Visits          4 months ago Dental abscess   [facility] Care at [facility], Gildardo Pounds, NP   12 months ago Microcytic anemia   [facility] Hoy Register, MD   1 year ago Hyperglycemia   [facility] Wellness [place], [place], [place]   3 years ago Rash   [facility] [place], [place], [place]   3 years ago Chronic migraine without aura without status migrainosus, not intractable    [facility] And Wellness Hoy Register, MD

## 2022-06-29 ENCOUNTER — Emergency Department (HOSPITAL_COMMUNITY)
Admission: EM | Admit: 2022-06-29 | Discharge: 2022-06-30 | Disposition: A | Discharge status: Home / Self Care | Payer: Commercial Managed Care - HMO | Attending: Emergency Medicine | Admitting: Emergency Medicine

## 2022-06-29 ENCOUNTER — Encounter (HOSPITAL_COMMUNITY): Payer: Self-pay

## 2022-06-29 DIAGNOSIS — M549 Dorsalgia, unspecified: Secondary | ICD-10-CM | POA: Diagnosis present

## 2022-06-29 DIAGNOSIS — M545 Low back pain, unspecified: Secondary | ICD-10-CM

## 2022-06-29 DIAGNOSIS — G8929 Other chronic pain: Secondary | ICD-10-CM | POA: Diagnosis not present

## 2022-06-29 NOTE — ED Provider Triage Note (Signed)
Emergency Medicine Provider Triage Evaluation Note  [name] , a 31 y.o. female  was evaluated in triage.  Pt complains of acute on chronic back pain. Mid back pain onset 7 months ago. Intermittent and gradually worsening. This episode present for 2-3 days. Worse with movement and stretching. No improvement with tylenol or ibuprofen. Denies genital numbness, fevers, incontinence, falls or trauma. Has BLE tingling sporadically, but this has been present for "years" and has not been any worse since back pain began.  Review of Systems  Positive: As above Negative: As above  Physical Exam  BP 108/61 (BP Location: Right Arm)   Pulse 94   Temp 98.9 F (37.2 C) (Oral)   Resp 15   SpO2 95%  Gen:   Awake, no distress. Morbidly obese. Resp:  Normal effort  MSK:   Moves extremities without difficulty  Other:  TTP diffusely to back. No bony deformities, step offs, crepitus to the thoracic or lumbosacral midline.  Medical Decision Making  Medically screening exam initiated at 11:39 PM.  Appropriate orders placed.  [name] was informed that the remainder of the evaluation will be completed by another provider, this initial triage assessment does not replace that evaluation, and the importance of remaining in the ED until their evaluation is complete.  AoC back pain. Neurovascularly intact w/o red flags or signs concerning for cauda equina.   Antony Madura, PA-C 06/29/22 2341

## 2022-06-29 NOTE — ED Triage Notes (Signed)
Pt reports that she was stretching to get something back in January and hurt her back. Back has been hurting ever since

## 2022-06-30 MED ORDER — MELOXICAM 7.5 MG PO TABS
7.5000 mg | ORAL_TABLET | Freq: Every day | ORAL | 0 refills | Status: DC
  Filled 2022-06-30: qty 10, 10d supply, fill #0

## 2022-06-30 MED ORDER — CYCLOBENZAPRINE HCL 10 MG PO TABS
10.0000 mg | ORAL_TABLET | Freq: Every day | ORAL | 0 refills | Status: DC
  Filled 2022-06-30: qty 12, 12d supply, fill #0

## 2022-06-30 NOTE — Discharge Instructions (Addendum)
Medication given today to help with the pain in your back.  As discussed, this is going to be a chronic and long-term/ongoing problem that will require physical therapy and healthy weight loss management plan.  Recommend calling the community clinic or your primary care doctor as listed in your paperwork to help with this plan.

## 2022-06-30 NOTE — ED Provider Notes (Signed)
[facility] EMERGENCY DEPARTMENT Provider Note   CSN: [id number] Arrival date & time: 06/29/22  2238     History  Chief Complaint  Patient presents with   Back Pain    [name] is a 31 y.o. female.  31yo female with complaint of back pain, onset in January when she was stretching and developed diffuse back pain. Pain had improved at that time but not with recurrent pain. Has been treated in the past with Motrin and Tylenol, Gabapentin and Flexeril. Associated paresthesias at times down both legs. Denies changes in bowel or bladder habits, fevers, saddle paresthesia, IVDA.       Home Medications Prior to Admission medications   Medication Sig Start Date End Date Taking? Authorizing Provider  meloxicam (MOBIC) 7.5 MG tablet Take 1 tablet (7.5 mg total) by mouth daily. 06/30/22  Yes Jeannie Fend, PA-C  acetaminophen (TYLENOL) 500 MG tablet Take 1,000 mg by mouth every 6 (six) hours as needed for mild pain. Patient not taking: Reported on 01/17/2021    [provider]  amphetamine-dextroamphetamine (ADDERALL) 20 MG tablet Take 1 tablet (20 mg total) by mouth daily. 01/25/22   Shanna Cisco, NP  cetirizine (ZYRTEC) 10 MG tablet Take 1 tablet (10 mg total) by mouth daily. Patient not taking: Reported on 01/17/2021 06/30/19   Hoy Register, MD  clotrimazole (LOTRIMIN) 1 % cream Apply 1 application topically 2 (two) times daily. Patient not taking: Reported on 01/17/2021 09/15/18   Hoy Register, MD  cyclobenzaprine (FLEXERIL) 10 MG tablet Take 1 tablet (10 mg total) by mouth at bedtime. 06/30/22   Jeannie Fend, PA-C  ferrous sulfate 325 (65 FE) MG tablet Take 1 tablet (325 mg total) by mouth 2 (two) times daily with a meal. 01/18/21   Anders Simmonds, PA-C  ferrous sulfate 325 (65 FE) MG tablet TAKE 1 TABLET (325 MG TOTAL) BY MOUTH 2 (TWO) TIMES DAILY WITH A MEAL. 01/18/21 01/18/22  Anders Simmonds, PA-C  FLUoxetine (PROZAC) 20 MG tablet Take 2  tablets (40 mg total) by mouth daily. 01/25/22   Shanna Cisco, NP  gabapentin (NEURONTIN) 300 MG capsule Take 1 capsule (300 mg total) by mouth 3 (three) times daily. 11/26/21   Hoy Register, MD  hydrOXYzine (ATARAX) 10 MG tablet Take 1 tablet (10 mg total) by mouth 3 (three) times daily as needed. 01/25/22   Shanna Cisco, NP  metFORMIN (GLUCOPHAGE XR) 500 MG 24 hr tablet Take 1 tablet (500 mg total) by mouth daily after supper. 01/17/21   Anders Simmonds, PA-C  metFORMIN (GLUCOPHAGE-XR) 500 MG 24 hr tablet TAKE 1 TABLET (500 MG TOTAL) BY MOUTH DAILY AFTER SUPPER. 01/17/21 01/17/22  Anders Simmonds, PA-C  nystatin cream (MYCOSTATIN) Apply to affected area 2 times daily Patient not taking: No sig reported 11/15/17   Aviva Kluver B, PA-C  omeprazole (PRILOSEC) 20 MG capsule Take 1 capsule (20 mg total) by mouth 2 (two) times daily before a meal. 06/28/22   Hoy Register, MD  promethazine-dextromethorphan (PROMETHAZINE-DM) 6.25-15 MG/5ML syrup Take 5 mLs by mouth 4 (four) times daily as needed for cough. 01/17/21   Anders Simmonds, PA-C  topiramate (TOPAMAX) 50 MG tablet Take 1 tablet (50 mg total) by mouth 2 (two) times daily. 07/03/21   Hoy Register, MD  Vitamin D, Ergocalciferol, (DRISDOL) 50000 units CAPS capsule Take 1 capsule (50,000 Units total) by mouth every 7 (seven) days. Patient not taking: No sig reported 11/20/17  Georgian Co M, PA-C      Allergies    Banana and Food    Review of Systems   Review of Systems Negative except as per HPI Physical Exam Updated Vital Signs BP 129/78 (BP Location: Right Arm)   Pulse 82   Temp 98.5 F (36.9 C) (Oral)   Resp 20   SpO2 100%  Physical Exam Vitals and nursing note reviewed.  Constitutional:      General: She is not in acute distress.    Appearance: She is well-developed. She is obese. She is not diaphoretic.  HENT:     Head: Normocephalic and atraumatic.  Cardiovascular:     Pulses: Normal pulses.  Pulmonary:      Effort: Pulmonary effort is normal.  Musculoskeletal:        General: Tenderness present.       Back:  Skin:    General: Skin is warm and dry.     Findings: No erythema or rash.  Neurological:     Mental Status: She is alert and oriented to person, place, and time.     Sensory: No sensory deficit.     Motor: No weakness.     Deep Tendon Reflexes: Babinski sign absent on the right side. Babinski sign absent on the left side.     Reflex Scores:      Achilles reflexes are 1+ on the right side and 1+ on the left side. Psychiatric:        Behavior: Behavior normal.     ED Results / Procedures / Treatments   Labs (all labs ordered are listed, but only abnormal results are displayed) Labs Reviewed - No data to display  EKG None  Radiology No results found.  Procedures Procedures    Medications Ordered in ED Medications - No data to display  ED Course/ Medical Decision Making/ A&P                           Medical Decision Making Risk Prescription drug management.   31 year old female with acute on chronic back pain without trauma or other associated concerns.  Patellar reflexes limited by body habitus.  Achilles reflexes symmetric, negative Babinski, leg strength symmetric.  Patient is able to ambulate at her baseline.  She is offered meloxicam and Flexeril for her pain.  Patient was advised that medications alone will not treat her pain and that ultimately she would benefit from PCP management including likely PT referral and healthy weight loss plan.        Final Clinical Impression(s) / ED Diagnoses Final diagnoses:  Chronic back pain, unspecified back location, unspecified back pain laterality    Rx / DC Orders ED Discharge Orders          Ordered    cyclobenzaprine (FLEXERIL) 10 MG tablet  Daily at bedtime        06/30/22 0836    meloxicam (MOBIC) 7.5 MG tablet  Daily        06/30/22 0836              Jeannie Fend, PA-C 06/30/22  1314    Terald Sleeper, MD 06/30/22 1529

## 2022-07-01 ENCOUNTER — Other Ambulatory Visit: Payer: Self-pay

## 2022-07-02 ENCOUNTER — Other Ambulatory Visit: Payer: Self-pay

## 2022-07-02 ENCOUNTER — Other Ambulatory Visit: Payer: Self-pay | Admitting: Family Medicine

## 2022-07-02 DIAGNOSIS — K219 Gastro-esophageal reflux disease without esophagitis: Secondary | ICD-10-CM

## 2022-07-03 ENCOUNTER — Other Ambulatory Visit: Payer: Self-pay | Admitting: Family Medicine

## 2022-07-03 ENCOUNTER — Other Ambulatory Visit: Payer: Self-pay

## 2022-07-03 DIAGNOSIS — K219 Gastro-esophageal reflux disease without esophagitis: Secondary | ICD-10-CM

## 2022-07-03 NOTE — Telephone Encounter (Signed)
Patient requesting omeprazole (PRILOSEC) 20 MG capsule sent to  [facility] [place] Phone:  [phone]  Fax:  [phone]    Instead of  [facility] #[id number] [place], [place] - [address] Phone:  [phone]  Fax:  [phone]     Please assist further

## 2022-07-04 NOTE — Telephone Encounter (Signed)
Refilled 06/28/22 # 60.

## 2022-07-08 ENCOUNTER — Other Ambulatory Visit: Payer: Self-pay | Admitting: Family Medicine

## 2022-07-08 ENCOUNTER — Other Ambulatory Visit: Payer: Self-pay | Admitting: Pharmacist

## 2022-07-08 ENCOUNTER — Other Ambulatory Visit: Payer: Self-pay

## 2022-07-08 DIAGNOSIS — K219 Gastro-esophageal reflux disease without esophagitis: Secondary | ICD-10-CM

## 2022-07-08 MED ORDER — OMEPRAZOLE 20 MG PO CPDR
20.0000 mg | DELAYED_RELEASE_CAPSULE | Freq: Two times a day (BID) | ORAL | 0 refills | Status: DC
  Filled 2022-07-08: qty 60, 30d supply, fill #0

## 2022-08-01 ENCOUNTER — Ambulatory Visit
Admission: RE | Admit: 2022-08-01 | Discharge: 2022-08-01 | Disposition: A | Discharge status: Home / Self Care | Payer: Commercial Managed Care - HMO | Source: Ambulatory Visit | Attending: Physician Assistant | Admitting: Physician Assistant

## 2022-08-01 ENCOUNTER — Other Ambulatory Visit: Payer: Self-pay

## 2022-08-01 ENCOUNTER — Ambulatory Visit: Payer: Commercial Managed Care - HMO | Attending: Physician Assistant | Admitting: Physician Assistant

## 2022-08-01 ENCOUNTER — Encounter: Payer: Self-pay | Admitting: Physician Assistant

## 2022-08-01 VITALS — BP 136/86 | HR 110 | Temp 98.7°F | Resp 21 | Ht 63.0 in | Wt >= 6400 oz

## 2022-08-01 DIAGNOSIS — R7303 Prediabetes: Secondary | ICD-10-CM | POA: Diagnosis not present

## 2022-08-01 DIAGNOSIS — M545 Low back pain, unspecified: Secondary | ICD-10-CM | POA: Diagnosis not present

## 2022-08-01 DIAGNOSIS — R202 Paresthesia of skin: Secondary | ICD-10-CM

## 2022-08-01 DIAGNOSIS — M25562 Pain in left knee: Secondary | ICD-10-CM | POA: Diagnosis not present

## 2022-08-01 DIAGNOSIS — G43709 Chronic migraine without aura, not intractable, without status migrainosus: Secondary | ICD-10-CM

## 2022-08-01 DIAGNOSIS — F411 Generalized anxiety disorder: Secondary | ICD-10-CM

## 2022-08-01 DIAGNOSIS — G8929 Other chronic pain: Secondary | ICD-10-CM

## 2022-08-01 DIAGNOSIS — F330 Major depressive disorder, recurrent, mild: Secondary | ICD-10-CM

## 2022-08-01 DIAGNOSIS — Z6841 Body Mass Index (BMI) 40.0 and over, adult: Secondary | ICD-10-CM

## 2022-08-01 MED ORDER — TOPIRAMATE 50 MG PO TABS
50.0000 mg | ORAL_TABLET | Freq: Two times a day (BID) | ORAL | 3 refills | Status: DC
  Filled 2022-08-01: qty 60, 30d supply, fill #0

## 2022-08-01 MED ORDER — CYCLOBENZAPRINE HCL 10 MG PO TABS
10.0000 mg | ORAL_TABLET | Freq: Every day | ORAL | 0 refills | Status: DC
  Filled 2022-08-01: qty 30, 30d supply, fill #0

## 2022-08-01 MED ORDER — FLUOXETINE HCL 20 MG PO CAPS
40.0000 mg | ORAL_CAPSULE | Freq: Every day | ORAL | 3 refills | Status: DC
  Filled 2022-08-01: qty 60, 30d supply, fill #0

## 2022-08-01 MED ORDER — MELOXICAM 7.5 MG PO TABS
7.5000 mg | ORAL_TABLET | Freq: Every day | ORAL | 1 refills | Status: DC
  Filled 2022-08-01: qty 30, 30d supply, fill #0
  Filled 2022-11-01 – 2022-11-12 (×2): qty 30, 30d supply, fill #1

## 2022-08-01 NOTE — Progress Notes (Signed)
Patient ID: [name], female   DOB: 24-Sep-1991, 31 y.o.   MRN: [id number]    [name], is a 31 y.o. female  [id number]  [id number]  DOB - 1990-12-22  Chief Complaint  Patient presents with   Follow-up    Pain is worse than to the ED, 8 right now on 0-10 Needing refill on several meds. Off some due to financial strain        Subjective:   [name] is a 31 y.o. female here today for a follow up after being seen at ED 7/15 for back pain:  "Threw back out" at some point before January 2022 and as had several flare ups since then.  Pain was improved with flexeril and meloxicam.  PT was recommended.  Some pain in legs but does not seem to be associated with back.     From ED note: 31 year old female with acute on chronic back pain without trauma or other associated concerns.  Patellar reflexes limited by body habitus.  Achilles reflexes symmetric, negative Babinski, leg strength symmetric.  Patient is able to ambulate at her baseline.  She is offered meloxicam and Flexeril for her pain.  Patient was advised that medications alone will not treat her pain and that ultimately she would benefit from PCP management including likely PT referral and healthy weight loss plan.  No problems updated.  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 Authorizing Provider  acetaminophen (TYLENOL)  500 MG tablet Take 1,000 mg by mouth every 6 (six) hours as needed for mild pain.   Yes [provider]  amphetamine-dextroamphetamine (ADDERALL) 20 MG tablet Take 1 tablet (20 mg total) by mouth daily. 01/25/22  Yes Eulis Canner E, NP  gabapentin (NEURONTIN) 300 MG capsule Take 1 capsule (300 mg total) by mouth 3 (three) times daily. 11/26/21  Yes Charlott Rakes, MD  hydrOXYzine (ATARAX) 10 MG tablet Take 1 tablet (10 mg total) by mouth 3 (three) times daily as needed. 01/25/22  Yes Eulis Canner E, NP  omeprazole (PRILOSEC) 20 MG capsule Take 1 capsule (20 mg total) by mouth 2 (two) times daily before a meal. 07/08/22  Yes Newlin, Enobong, MD  cetirizine (ZYRTEC) 10 MG tablet Take 1 tablet (10 mg total) by mouth daily. Patient not taking: Reported on 01/17/2021 06/30/19   Charlott Rakes, MD  clotrimazole (LOTRIMIN) 1 % cream Apply 1 application topically 2 (two) times daily. Patient not taking: Reported on 01/17/2021 09/15/18   Charlott Rakes, MD  cyclobenzaprine (FLEXERIL) 10 MG tablet Take 1 tablet (10 mg total) by mouth at bedtime. 08/01/22   Argentina Donovan, PA-C  ferrous sulfate 325 (65 FE) MG tablet Take 1 tablet (325 mg total) by mouth 2 (two) times daily with a meal. Patient not taking: Reported on 08/01/2022 01/18/21  Georgian Co M, PA-C  ferrous sulfate 325 (65 FE) MG tablet TAKE 1 TABLET (325 MG TOTAL) BY MOUTH 2 (TWO) TIMES DAILY WITH A MEAL. 01/18/21 01/18/22  Anders Simmonds, PA-C  FLUoxetine (PROZAC) 20 MG capsule Take 2 capsules (40 mg total) by mouth daily. 08/01/22   Anders Simmonds, PA-C  meloxicam (MOBIC) 7.5 MG tablet Take 1 tablet (7.5 mg total) by mouth daily. 08/01/22   Anders Simmonds, PA-C  metFORMIN (GLUCOPHAGE XR) 500 MG 24 hr tablet Take 1 tablet (500 mg total) by mouth daily after supper. Patient not taking: Reported on 08/01/2022 01/17/21   Anders Simmonds, PA-C  metFORMIN (GLUCOPHAGE-XR) 500 MG 24 hr tablet TAKE 1 TABLET (500 MG TOTAL) BY MOUTH DAILY  AFTER SUPPER. 01/17/21 01/17/22  Anders Simmonds, PA-C  nystatin cream (MYCOSTATIN) Apply to affected area 2 times daily Patient not taking: No sig reported 11/15/17   Aviva Kluver B, PA-C  promethazine-dextromethorphan (PROMETHAZINE-DM) 6.25-15 MG/5ML syrup Take 5 mLs by mouth 4 (four) times daily as needed for cough. Patient not taking: Reported on 08/01/2022 01/17/21   Anders Simmonds, PA-C  topiramate (TOPAMAX) 50 MG tablet Take 1 tablet (50 mg total) by mouth 2 (two) times daily. 08/01/22   Anders Simmonds, PA-C  Vitamin D, Ergocalciferol, (DRISDOL) 50000 units CAPS capsule Take 1 capsule (50,000 Units total) by mouth every 7 (seven) days. Patient not taking: Reported on 01/27/2018 11/20/17   Anders Simmonds, PA-C    ROS: Neg HEENT Neg resp Neg cardiac Neg GI Neg GU Neg psych Neg neuro  Objective:   Vitals:   08/01/22 1541  BP: 136/86  Pulse: (!) 110  Resp: (!) 21  Temp: 98.7 F (37.1 C)  SpO2: 99%  Weight: (!) 416 lb (188.7 kg)  Height: 5\' 3"  (1.6 m)   Exam General appearance : Awake, alert, not in any distress. Speech Clear. Not toxic looking. Blunted affect HEENT: Atraumatic and Normocephalic Neck: Supple, no JVD. No cervical lymphadenopathy.  Chest: Good air entry bilaterally, CTAB.  No rales/rhonchi/wheezing CVS: S1 S2 regular, no murmurs.  Difficult to examine due to body habitus.  Neg SLR Extremities: B/L Lower Ext shows no edema, both legs are warm to touch Neurology: Awake alert, and oriented X 3, CN II-XII intact, Non focal Skin: No Rash  Data Review Lab Results  Component Value Date   HGBA1C 5.6 01/17/2021   HGBA1C 5.6 12/29/2017   HGBA1C 5.7 01/10/2017    Assessment & Plan   1. Chronic midline low back pain without sciatica No red flags - DG Lumbar Spine Complete; Future - CBC with Differential/Platelet - meloxicam (MOBIC) 7.5 MG tablet; Take 1 tablet (7.5 mg total) by mouth daily.  Dispense: 30 tablet; Refill: 1 - cyclobenzaprine (FLEXERIL)  10 MG tablet; Take 1 tablet (10 mg total) by mouth at bedtime.  Dispense: 30 tablet; Refill: 0 - Ambulatory referral to Physical Therapy  2. Paresthesia - Basic metabolic panel - CBC with Differential/Platelet  3. Chronic pain of left knee - Ambulatory referral to Physical Therapy  4. Generalized anxiety disorder - FLUoxetine (PROZAC) 20 MG capsule; Take 2 capsules (40 mg total) by mouth daily.  Dispense: 60 capsule; Refill: 3  5. Depression, major, recurrent, mild (HCC) - FLUoxetine (PROZAC) 20 MG capsule; Take 2 capsules (40 mg total) by mouth daily.  Dispense: 60 capsule; Refill: 3  6. Chronic migraine without aura without status migrainosus, not intractable - topiramate (TOPAMAX) 50 MG tablet; Take 1 tablet (50  mg total) by mouth 2 (two) times daily.  Dispense: 60 tablet; Refill: 3  7. Prediabetes - Hemoglobin A1c    Return in about 3 months (around 11/01/2022) for pcp chronic conditions.  The patient was given clear instructions to go to ER or return to medical center if symptoms don't improve, worsen or new problems develop. The patient verbalized understanding. The patient was told to call to get lab results if they haven't heard anything in the next week.      Georgian Co, PA-C [facility] [place], [place] [phone]   08/01/2022, 4:01 PM

## 2022-08-02 ENCOUNTER — Other Ambulatory Visit: Payer: Self-pay | Admitting: Physician Assistant

## 2022-08-02 ENCOUNTER — Other Ambulatory Visit: Payer: Self-pay

## 2022-08-02 DIAGNOSIS — D509 Iron deficiency anemia, unspecified: Secondary | ICD-10-CM

## 2022-08-02 LAB — CBC WITH DIFFERENTIAL/PLATELET
Basophils Absolute: 0.1 10*3/uL (ref 0.0–0.2)
Basos: 1 %
EOS (ABSOLUTE): 0.1 10*3/uL (ref 0.0–0.4)
Eos: 1 %
Hematocrit: 25.9 % — ABNORMAL LOW (ref 34.0–46.6)
Hemoglobin: 6.8 g/dL — CL (ref 11.1–15.9)
Immature Grans (Abs): 0 10*3/uL (ref 0.0–0.1)
Immature Granulocytes: 0 %
Lymphocytes Absolute: 2.3 10*3/uL (ref 0.7–3.1)
Lymphs: 24 %
MCH: 14.7 pg — ABNORMAL LOW (ref 26.6–33.0)
MCHC: 26.3 g/dL — ABNORMAL LOW (ref 31.5–35.7)
MCV: 56 fL — ABNORMAL LOW (ref 79–97)
Monocytes Absolute: 0.9 10*3/uL (ref 0.1–0.9)
Monocytes: 10 %
Neutrophils Absolute: 6.1 10*3/uL (ref 1.4–7.0)
Neutrophils: 64 %
Platelets: 455 10*3/uL — ABNORMAL HIGH (ref 150–450)
RBC: 4.63 x10E6/uL (ref 3.77–5.28)
RDW: 20.9 % — ABNORMAL HIGH (ref 11.7–15.4)
WBC: 9.5 10*3/uL (ref 3.4–10.8)

## 2022-08-02 LAB — BASIC METABOLIC PANEL
BUN/Creatinine Ratio: 16 (ref 9–23)
BUN: 10 mg/dL (ref 6–20)
CO2: 22 mmol/L (ref 20–29)
Calcium: 9.4 mg/dL (ref 8.7–10.2)
Chloride: 104 mmol/L (ref 96–106)
Creatinine, Ser: 0.64 mg/dL (ref 0.57–1.00)
Glucose: 80 mg/dL (ref 70–99)
Potassium: 4.1 mmol/L (ref 3.5–5.2)
Sodium: 140 mmol/L (ref 134–144)
eGFR: 120 mL/min/{1.73_m2} (ref >59)

## 2022-08-02 LAB — HEMOGLOBIN A1C
Est. average glucose Bld gHb Est-mCnc: 114 mg/dL
Hgb A1c MFr Bld: 5.6 % (ref 4.8–5.6)

## 2022-08-02 MED ORDER — FERROUS SULFATE 325 (65 FE) MG PO TABS
ORAL_TABLET | ORAL | 1 refills | Status: DC
  Filled 2022-08-02: qty 180, 90d supply, fill #0

## 2022-08-03 ENCOUNTER — Encounter (HOSPITAL_COMMUNITY): Payer: Self-pay | Admitting: Emergency Medicine

## 2022-08-03 ENCOUNTER — Other Ambulatory Visit: Payer: Self-pay

## 2022-08-03 ENCOUNTER — Emergency Department (HOSPITAL_COMMUNITY)
Admission: EM | Admit: 2022-08-03 | Discharge: 2022-08-03 | Disposition: A | Discharge status: Home / Self Care | Payer: Commercial Managed Care - HMO | Attending: Emergency Medicine | Admitting: Emergency Medicine

## 2022-08-03 DIAGNOSIS — D649 Anemia, unspecified: Secondary | ICD-10-CM

## 2022-08-03 DIAGNOSIS — N920 Excessive and frequent menstruation with regular cycle: Secondary | ICD-10-CM

## 2022-08-03 LAB — CBC WITH DIFFERENTIAL/PLATELET
Abs Immature Granulocytes: 0 10*3/uL (ref 0.00–0.07)
Basophils Absolute: 0 10*3/uL (ref 0.0–0.1)
Basophils Relative: 0 %
Eosinophils Absolute: 0.1 10*3/uL (ref 0.0–0.5)
Eosinophils Relative: 1 %
HCT: 25.1 % — ABNORMAL LOW (ref 36.0–46.0)
Hemoglobin: 6.9 g/dL — CL (ref 12.0–15.0)
Lymphocytes Relative: 31 %
Lymphs Abs: 2.1 10*3/uL (ref 0.7–4.0)
MCH: 15.2 pg — ABNORMAL LOW (ref 26.0–34.0)
MCHC: 27.5 g/dL — ABNORMAL LOW (ref 30.0–36.0)
MCV: 55.3 fL — ABNORMAL LOW (ref 80.0–100.0)
Monocytes Absolute: 0.3 10*3/uL (ref 0.1–1.0)
Monocytes Relative: 4 %
Neutro Abs: 4.3 10*3/uL (ref 1.7–7.7)
Neutrophils Relative %: 64 %
Platelets: 439 10*3/uL — ABNORMAL HIGH (ref 150–400)
RBC: 4.54 MIL/uL (ref 3.87–5.11)
RDW: 22.5 % — ABNORMAL HIGH (ref 11.5–15.5)
WBC: 6.7 10*3/uL (ref 4.0–10.5)
nRBC: 0 % (ref 0.0–0.2)
nRBC: 0 /100 WBC

## 2022-08-03 LAB — PREPARE RBC (CROSSMATCH)

## 2022-08-03 LAB — BASIC METABOLIC PANEL
Anion gap: 7 (ref 5–15)
BUN: 7 mg/dL (ref 6–20)
CO2: 23 mmol/L (ref 22–32)
Calcium: 8.7 mg/dL — ABNORMAL LOW (ref 8.9–10.3)
Chloride: 108 mmol/L (ref 98–111)
Creatinine, Ser: 0.58 mg/dL (ref 0.44–1.00)
GFR, Estimated: >60 mL/min (ref >60)
Glucose, Bld: 96 mg/dL (ref 70–99)
Potassium: 3.6 mmol/L (ref 3.5–5.1)
Sodium: 138 mmol/L (ref 135–145)

## 2022-08-03 LAB — I-STAT BETA HCG BLOOD, ED (MC, WL, AP ONLY): I-stat hCG, quantitative: <5 m[IU]/mL (ref <5)

## 2022-08-03 LAB — ETHANOL: Alcohol, Ethyl (B): <10 mg/dL (ref <10)

## 2022-08-03 MED ORDER — SODIUM CHLORIDE 0.9 % IV SOLN: 10.0000 mL/h | Freq: Once | INTRAVENOUS | Status: DC

## 2022-08-03 MED ORDER — FERROUS SULFATE 325 (65 FE) MG PO TABS
325.0000 mg | ORAL_TABLET | Freq: Every day | ORAL | 0 refills | Status: DC
  Filled 2022-08-03 – 2022-08-13 (×2): qty 30, 30d supply, fill #0

## 2022-08-03 MED ORDER — TRANEXAMIC ACID 650 MG PO TABS
1300.0000 mg | ORAL_TABLET | Freq: Three times a day (TID) | ORAL | 0 refills | Status: DC
  Filled 2022-08-03: qty 30, 5d supply, fill #0

## 2022-08-03 NOTE — ED Provider Triage Note (Signed)
Emergency Medicine Provider Triage Evaluation Note  [name] , a 31 y.o. female  was evaluated in triage.  Pt complains of low hemoglobin.  History of anemia.  Reports that her doctor called her yesterday and said that her hemoglobin was low and that she needed a blood transfusion.  Denies hematuria, bloody stools denies hemoptysis denies abnormal vaginal discharge.  Endorses fatigue and states it has been more difficult to walk.  Denies shortness of breath or chest pain.  Review of Systems  Positive: As above Negative: As above  Physical Exam  There were no vitals taken for this visit. Gen:   Awake, no distress, lethargic Resp:  Normal effort  MSK:   Moves extremities without difficulty  Other:    Medical Decision Making  Medically screening exam initiated at 7:09 PM.  Appropriate orders placed.  Druscilla A Schepers was informed that the remainder of the evaluation will be completed by another provider, this initial triage assessment does not replace that evaluation, and the importance of remaining in the ED until their evaluation is complete.  Ordered CBC, BMP, type and cross.   Gareth Eagle, PA-C 08/03/22 1914

## 2022-08-03 NOTE — ED Provider Notes (Signed)
[facility] EMERGENCY DEPARTMENT Provider Note   CSN: [id number] Arrival date & time: 08/03/22  1850     History  Chief Complaint  Patient presents with   Hgb= 6.6    [name] is a 31 y.o. female.  Pt is a 31 yo female with a pmhx significant for menorrhagia, anemia, obesity, osa, gerd, adhd, and depression.  Pt said she went to her doctor for a routine appointment on 8/17.  Pt had blood work done which showed a hgb of 6.8.  She has chronic anemia and her hgbs have been in the mid-7 ranges for the past year.  Pt said she has very heavy periods, but is not currently on her period.  It looks like she's been referred to obgyn, but has not seen them recently. She has had to have a blood transfusion in the past for the same.  She does feel tired, but not much more than normal.  She denies black or bloody stools.       Home Medications Prior to Admission medications   Medication Sig Start Date End Date Taking? Authorizing Provider  ferrous sulfate 325 (65 FE) MG tablet Take 1 tablet (325 mg total) by mouth daily. 08/03/22  Yes Jacalyn Lefevre, MD  tranexamic acid (LYSTEDA) 650 MG TABS tablet Take 2 tablets (1,300 mg total) by mouth 3 (three) times daily. Take at the start of your period for up to 5 days. 08/03/22  Yes Jacalyn Lefevre, MD  acetaminophen (TYLENOL) 500 MG tablet Take 1,000 mg by mouth every 6 (six) hours as needed for mild pain.    [provider]  amphetamine-dextroamphetamine (ADDERALL) 20 MG tablet Take 1 tablet (20 mg total) by mouth daily. 01/25/22   Shanna Cisco, NP  cetirizine (ZYRTEC) 10 MG tablet Take 1 tablet (10 mg total) by mouth daily. Patient not taking: Reported on 01/17/2021 06/30/19   Hoy Register, MD  clotrimazole (LOTRIMIN) 1 % cream Apply 1 application topically 2 (two) times daily. Patient not taking: Reported on 01/17/2021 09/15/18   Hoy Register, MD  cyclobenzaprine (FLEXERIL) 10 MG tablet Take 1 tablet (10 mg  total) by mouth at bedtime. 08/01/22   Anders Simmonds, PA-C  FLUoxetine (PROZAC) 20 MG capsule Take 2 capsules (40 mg total) by mouth daily. 08/01/22   Anders Simmonds, PA-C  gabapentin (NEURONTIN) 300 MG capsule Take 1 capsule (300 mg total) by mouth 3 (three) times daily. 11/26/21   Hoy Register, MD  hydrOXYzine (ATARAX) 10 MG tablet Take 1 tablet (10 mg total) by mouth 3 (three) times daily as needed. 01/25/22   Shanna Cisco, NP  meloxicam (MOBIC) 7.5 MG tablet Take 1 tablet (7.5 mg total) by mouth daily. 08/01/22   Anders Simmonds, PA-C  metFORMIN (GLUCOPHAGE XR) 500 MG 24 hr tablet Take 1 tablet (500 mg total) by mouth daily after supper. Patient not taking: Reported on 08/01/2022 01/17/21   Anders Simmonds, PA-C  metFORMIN (GLUCOPHAGE-XR) 500 MG 24 hr tablet TAKE 1 TABLET (500 MG TOTAL) BY MOUTH DAILY AFTER SUPPER. 01/17/21 01/17/22  Anders Simmonds, PA-C  nystatin cream (MYCOSTATIN) Apply to affected area 2 times daily Patient not taking: No sig reported 11/15/17   Aviva Kluver B, PA-C  omeprazole (PRILOSEC) 20 MG capsule Take 1 capsule (20 mg total) by mouth 2 (two) times daily before a meal. 07/08/22   Hoy Register, MD  promethazine-dextromethorphan (PROMETHAZINE-DM) 6.25-15 MG/5ML syrup Take 5 mLs by mouth 4 (four) times  daily as needed for cough. Patient not taking: Reported on 08/01/2022 01/17/21   Anders Simmonds, PA-C  topiramate (TOPAMAX) 50 MG tablet Take 1 tablet (50 mg total) by mouth 2 (two) times daily. 08/01/22   Anders Simmonds, PA-C  Vitamin D, Ergocalciferol, (DRISDOL) 50000 units CAPS capsule Take 1 capsule (50,000 Units total) by mouth every 7 (seven) days. Patient not taking: Reported on 01/27/2018 11/20/17   Anders Simmonds, PA-C      Allergies    Banana and Food    Review of Systems   Review of Systems  Neurological:  Positive for weakness.  All other systems reviewed and are negative.   Physical Exam Updated Vital Signs BP 123/73   Pulse 89    Temp 98 F (36.7 C) (Oral)   Resp 19   Ht 5\' 3"  (1.6 m)   Wt (!) 208 kg   LMP 07/23/2022   SpO2 100%   BMI 81.23 kg/m  Physical Exam Vitals and nursing note reviewed.  Constitutional:      Appearance: Normal appearance. She is obese.  HENT:     Head: Normocephalic and atraumatic.     Right Ear: External ear normal.     Left Ear: External ear normal.     Nose: Nose normal.     Mouth/Throat:     Mouth: Mucous membranes are moist.     Pharynx: Oropharynx is clear.  Eyes:     Extraocular Movements: Extraocular movements intact.     Conjunctiva/sclera: Conjunctivae normal.     Pupils: Pupils are equal, round, and reactive to light.  Cardiovascular:     Rate and Rhythm: Normal rate and regular rhythm.     Pulses: Normal pulses.     Heart sounds: Normal heart sounds.  Pulmonary:     Effort: Pulmonary effort is normal.     Breath sounds: Normal breath sounds.  Abdominal:     General: Abdomen is flat. Bowel sounds are normal.     Palpations: Abdomen is soft.  Musculoskeletal:        General: Normal range of motion.     Cervical back: Normal range of motion and neck supple.  Skin:    General: Skin is warm.     Capillary Refill: Capillary refill takes less than 2 seconds.  Neurological:     General: No focal deficit present.     Mental Status: She is alert and oriented to person, place, and time.  Psychiatric:        Mood and Affect: Mood normal.        Behavior: Behavior normal.     ED Results / Procedures / Treatments   Labs (all labs ordered are listed, but only abnormal results are displayed) Labs Reviewed  CBC WITH DIFFERENTIAL/PLATELET - Abnormal; Notable for the following components:      Result Value   Hemoglobin 6.9 (*)    HCT 25.1 (*)    MCV 55.3 (*)    MCH 15.2 (*)    MCHC 27.5 (*)    RDW 22.5 (*)    Platelets 439 (*)    All other components within normal limits  BASIC METABOLIC PANEL - Abnormal; Notable for the following components:   Calcium 8.7 (*)     All other components within normal limits  ETHANOL  URINALYSIS, ROUTINE W REFLEX MICROSCOPIC  RAPID URINE DRUG SCREEN, HOSP PERFORMED  I-STAT BETA HCG BLOOD, ED ([facility], [facility], [facility] ONLY)  TYPE AND SCREEN  PREPARE RBC (CROSSMATCH)  EKG None  Radiology No results found.  Procedures Procedures    Medications Ordered in ED Medications  0.9 %  sodium chloride infusion (0 mL/hr Intravenous Hold 08/03/22 2145)    ED Course/ Medical Decision Making/ A&P                           Medical Decision Making Amount and/or Complexity of Data Reviewed Labs: ordered.  Risk OTC drugs. Prescription drug management.   This patient presents to the ED for concern of anemia, this involves an extensive number of treatment options, and is a complaint that carries with it a high risk of complications and morbidity.  The differential diagnosis includes anemia   Co morbidities that complicate the patient evaluation  menorrhagia, anemia, obesity, osa, gerd, adhd, and depression   Additional history obtained:  Additional history obtained from epic chart review  Lab Tests:  I Ordered, and personally interpreted labs.  The pertinent results include:  cbc with hgb low at 6.9; bmp nl; preg neg  Cardiac Monitoring:  The patient was maintained on a cardiac monitor.  I personally viewed and interpreted the cardiac monitored which showed an underlying rhythm of: nsr   Medicines ordered and prescription drug management:  I ordered medication including blood transfusion  for anemia  Reevaluation of the patient after these medicines showed that the patient improved I have reviewed the patients home medicines and have made adjustments as needed  Critical Interventions:  transfusion   Problem List / ED Course:  Symptomatic anemia:  1 unit prbc ordered for transfusion.  Pt has a hx of menorrhagia and continues to have heavy periods.  This is likely the cause of current anemia.  Pt is d/c with  txa for next period and iron.  She needs to f/u with obgyn   Reevaluation:  After the interventions noted above, I reevaluated the patient and found that they have :improved   Social Determinants of Health:  Lives at home   Dispostion:  After consideration of the diagnostic results and the patients response to treatment, I feel that the patent would benefit from discharge with outpatient f/u.    CRITICAL CARE Performed by: Jacalyn Lefevre   Total critical care time: 30 minutes  Critical care time was exclusive of separately billable procedures and treating other patients.  Critical care was necessary to treat or prevent imminent or life-threatening deterioration.  Critical care was time spent personally by me on the following activities: development of treatment plan with patient and/or surrogate as well as nursing, discussions with consultants, evaluation of patient's response to treatment, examination of patient, obtaining history from patient or surrogate, ordering and performing treatments and interventions, ordering and review of laboratory studies, ordering and review of radiographic studies, pulse oximetry and re-evaluation of patient's condition.         Final Clinical Impression(s) / ED Diagnoses Final diagnoses:  Symptomatic anemia  Menorrhagia with regular cycle    Rx / DC Orders ED Discharge Orders          Ordered    ferrous sulfate 325 (65 FE) MG tablet  Daily        08/03/22 2107    tranexamic acid (LYSTEDA) 650 MG TABS tablet  3 times daily        08/03/22 2107              Jacalyn Lefevre, MD 08/03/22 2227

## 2022-08-03 NOTE — ED Triage Notes (Signed)
Patient received a call from her PCP advised her to go to ER due to low HGB=6.6 taken 2 days ago . Denies bleeding / no anticoagulant medication .

## 2022-08-04 LAB — BPAM RBC
Blood Product Expiration Date: 202309242359
ISSUE DATE / TIME: 202308192135
Unit Type and Rh: 5100

## 2022-08-04 LAB — TYPE AND SCREEN
ABO/RH(D): O POS
Antibody Screen: NEGATIVE
Unit division: 0

## 2022-08-05 ENCOUNTER — Other Ambulatory Visit: Payer: Self-pay

## 2022-08-06 ENCOUNTER — Other Ambulatory Visit: Payer: Self-pay

## 2022-08-06 ENCOUNTER — Other Ambulatory Visit: Payer: Self-pay | Admitting: Family Medicine

## 2022-08-06 DIAGNOSIS — G8929 Other chronic pain: Secondary | ICD-10-CM

## 2022-08-06 DIAGNOSIS — R202 Paresthesia of skin: Secondary | ICD-10-CM

## 2022-08-06 DIAGNOSIS — K219 Gastro-esophageal reflux disease without esophagitis: Secondary | ICD-10-CM

## 2022-08-07 ENCOUNTER — Other Ambulatory Visit: Payer: Self-pay

## 2022-08-07 MED ORDER — OMEPRAZOLE 20 MG PO CPDR
20.0000 mg | DELAYED_RELEASE_CAPSULE | Freq: Two times a day (BID) | ORAL | 2 refills | Status: DC
  Filled 2022-08-07: qty 60, 30d supply, fill #0
  Filled 2022-10-09: qty 60, 30d supply, fill #1

## 2022-08-07 MED ORDER — GABAPENTIN 300 MG PO CAPS
300.0000 mg | ORAL_CAPSULE | Freq: Three times a day (TID) | ORAL | 2 refills | Status: DC
  Filled 2022-08-07: qty 90, 30d supply, fill #0
  Filled 2022-11-01 – 2022-11-12 (×2): qty 90, 30d supply, fill #1
  Filled 2022-12-23 – 2023-01-09 (×3): qty 90, 30d supply, fill #2

## 2022-08-07 NOTE — Telephone Encounter (Signed)
Requested Prescriptions  Pending Prescriptions Disp Refills  . gabapentin (NEURONTIN) 300 MG capsule 90 capsule 2    Sig: Take 1 capsule (300 mg total) by mouth 3 (three) times daily.     Neurology: Anticonvulsants - gabapentin Passed - 08/06/2022 11:02 AM      Passed - Cr in normal range and within 360 days    Creat  Date Value Ref Range Status  03/28/2015 0.48 (L) 0.50 - 1.10 mg/dL Final   Creatinine, Ser  Date Value Ref Range Status  08/03/2022 0.58 0.44 - 1.00 mg/dL Final   Creatinine,U  Date Value Ref Range Status  02/14/2010 89.1 mg/dL Final    Comment:    See lab report for associated comment(s)         Passed - Completed PHQ-2 or PHQ-9 in the last 360 days      Passed - Valid encounter within last 12 months    Recent Outpatient Visits          6 days ago Prediabetes   [facility] [place], [place], [place]   5 months ago Dental abscess   [facility], Gildardo Pounds, NP   1 year ago Microcytic anemia   [facility] Hoy Register, MD   1 year ago Hyperglycemia   [facility] [place], [place], [place]   3 years ago Rash   [facility] [place], Odette Horns, MD      Future Appointments            In 6 days Hoy Register, MD [facility]   In 3 months Hoy Register, MD [facility]           . omeprazole (PRILOSEC) 20 MG capsule 60 capsule 2    Sig: Take 1 capsule (20 mg total) by mouth 2 (two) times daily before a meal.     Gastroenterology: Proton Pump Inhibitors Passed - 08/06/2022 11:02 AM      Passed - Valid encounter within last 12 months    Recent Outpatient Visits          6 days ago Prediabetes   [facility] [place], [place], [place]   5 months ago Dental abscess   [facility] [place] [facility], Gildardo Pounds, NP   1 year ago  Microcytic anemia   [facility] Hoy Register, MD   1 year ago Hyperglycemia   [facility] [place], [place], [place]   3 years ago Rash   [facility] [place], Odette Horns, MD      Future Appointments            In 6 days Hoy Register, MD [facility]   In 3 months Hoy Register, MD [facility]

## 2022-08-13 ENCOUNTER — Other Ambulatory Visit: Payer: Self-pay

## 2022-08-13 ENCOUNTER — Encounter: Payer: Commercial Managed Care - HMO | Admitting: Family Medicine

## 2022-08-14 ENCOUNTER — Encounter: Payer: Self-pay | Admitting: *Deleted

## 2022-08-16 ENCOUNTER — Telehealth (HOSPITAL_COMMUNITY): Payer: Self-pay | Admitting: *Deleted

## 2022-08-16 NOTE — Telephone Encounter (Signed)
VM from patient Friday afternoon asking for Dr Doyne Keel to send in a rx for her adderall 20 mg. There are notes in record from March and April re similar concern. Patient not seen since 2/23 and in March note she was not living in this state and we were unable to send in a rx. She has been told she needs to make an appt here before further rxs could be provided due to long length of time she has not seen a provider. Will ask front desk to contact her for a future appt or discuss with her walk in hours. Once she has seen a provider she can discuss with provider medicines.

## 2022-08-20 NOTE — Therapy (Unsigned)
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   ahi 5.5 ,mslt 07/01/06(off meds) mean latency 0 , with 4 sorems npsg 04/24/2010 2.2/hr   Exogenous obesity    Narcolepsy cataplexy syndrome 2007   Prior sleep studies starting on September 26 2005, AHI 5.3 per hour,   Poor sleep hygiene    Past Surgical History:  Procedure Laterality Date   none     Patient Active Problem List   Diagnosis Date Noted   Dental abscess 02/21/2022   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 prior episode (HCC) 10/31/2021   OSA (obstructive sleep apnea) 10/15/2021   Depression, major, recurrent, mild (HCC) 04/30/2021   Generalized anxiety disorder 04/30/2021   Migraine 06/02/2017   Intercostal pain 08/25/2016   Knee pain, bilateral 07/14/2016   Venous stasis dermatitis of both lower extremities 05/26/2016   Stye external 03/25/2016   Plantar fasciitis, right 03/25/2016   Leg swelling 11/17/2015   Headache 09/08/2015   Pain of molar 05/26/2015   Sebaceous cyst of left axilla 04/13/2015   Vitamin D deficiency 03/29/2015   Allergic rhinitis 04/11/2010   Iron deficiency anemia 03/09/2010   Esophageal reflux 03/24/2009   MENORRHAGIA 03/17/2009   Morbid obesity with body mass index of 70 and over in adult [facility]) 05/15/2007   Depression 02/12/2007   Attention deficit hyperactivity disorder (ADHD), predominantly inattentive type 02/12/2007   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    PCP: Hoy Register, MD  REFERRING PROVIDER: Anders Simmonds, PA-C  REFERRING DIAG: [phone] (ICD-10-CM) - Chronic pain of left knee M54.50,G89.29 (ICD-10-CM) - Chronic midline low back pain  without sciatica   Rationale for Evaluation and Treatment Rehabilitation  THERAPY DIAG: Chronic pain of left knee Chronic midline low back pain without sciatica    ONSET DATE: 08/01/2022   SUBJECTIVE:                                                                                                                                                                                           SUBJECTIVE STATEMENT: *** PERTINENT HISTORY:  "Threw back out" at some point before January 2022 and as had several flare ups since then.  Pain was improved with flexeril and meloxicam.  PT was recommended.  Some pain in legs but does not seem to be associated with back.  From ED note: 30 year old female with acute on chronic back pain without trauma or other associated concerns.  Patellar reflexes limited by body habitus.  Achilles reflexes symmetric, negative Babinski, leg strength symmetric.  Patient is able to ambulate at her baseline.  She is offered meloxicam and Flexeril for her pain.  Patient was advised that medications alone will not treat her pain and that ultimately she would benefit from PCP management including likely PT referral and healthy weight lo  PAIN:  Are you having pain? {OPRCPAIN:27236}   PRECAUTIONS: None  WEIGHT BEARING RESTRICTIONS No  FALLS:  Has patient fallen in last 6 months? No  LIVING ENVIRONMENT: Lives with: lives with their family  OCCUPATION: ***  PLOF: Independent  PATIENT GOALS ***   OBJECTIVE:   DIAGNOSTIC FINDINGS:  none  PATIENT SURVEYS:  Modified Oswestry ***   SCREENING FOR RED FLAGS: negative  COGNITION:  Overall cognitive status: Within functional limits for tasks assessed     SENSATION: Not tested  MUSCLE LENGTH: Hamstrings: Right *** deg; Left *** deg Thomas test: Right *** deg; Left *** deg  POSTURE: {posture:25561}  PALPATION: ***  LUMBAR ROM:   {AROM/PROM:27142}  A/PROM  eval  Flexion   Extension   Right  lateral flexion   Left lateral flexion   Right rotation   Left rotation    (Blank rows = not tested)  LOWER EXTREMITY ROM:     {AROM/PROM:27142}  Right eval Left eval  Hip flexion    Hip extension    Hip abduction    Hip adduction    Hip internal rotation    Hip external rotation    Knee flexion    Knee extension    Ankle dorsiflexion    Ankle plantarflexion    Ankle inversion    Ankle eversion     (Blank rows = not tested)  LOWER EXTREMITY MMT:    MMT Right eval Left eval  Hip flexion    Hip extension    Hip abduction    Hip adduction    Hip internal rotation    Hip external rotation    Knee flexion    Knee extension    Ankle dorsiflexion    Ankle plantarflexion    Ankle inversion    Ankle eversion     (Blank rows = not tested)  LUMBAR SPECIAL TESTS:  {lumbar special test:25242}  FUNCTIONAL TESTS:  {Functional tests:24029}  GAIT: Distance walked: *** Assistive device utilized: {Assistive devices:23999} Level of assistance: {Levels of assistance:24026} Comments: ***    TODAY'S TREATMENT  ***   PATIENT EDUCATION:  Education details: *** Person educated: {Person educated:25204} Education method: {Education Method:25205} Education comprehension: {Education Comprehension:25206}   HOME EXERCISE PROGRAM: ***  ASSESSMENT:  CLINICAL IMPRESSION: Patient is a *** y.o. *** who was seen today for physical therapy evaluation and treatment for ***.    OBJECTIVE IMPAIRMENTS {opptimpairments:25111}.   ACTIVITY LIMITATIONS {activitylimitations:27494}  PARTICIPATION LIMITATIONS: {participationrestrictions:25113}  PERSONAL FACTORS {Personal factors:25162} are also affecting patient's functional outcome.   REHAB POTENTIAL: {rehabpotential:25112}  CLINICAL DECISION MAKING: {clinical decision making:25114}  EVALUATION COMPLEXITY: {Evaluation complexity:25115}   GOALS: Goals reviewed with patient? {yes/no:20286}  SHORT TERM GOALS: Target date:  {follow up:25551}  *** Baseline: Goal status: {GOALSTATUS:25110}  2.  *** Baseline:  Goal status: {GOALSTATUS:25110}  3.  *** Baseline:  Goal status: {GOALSTATUS:25110}  4.  *** Baseline:  Goal status: {GOALSTATUS:25110}  5.  *** Baseline:  Goal status: {GOALSTATUS:25110}  6.  *** Baseline:  Goal status: {  GOALSTATUS:25110}  LONG TERM GOALS: Target date: {follow up:25551}  *** Baseline:  Goal status: {GOALSTATUS:25110}  2.  *** Baseline:  Goal status: {GOALSTATUS:25110}  3.  *** Baseline:  Goal status: {GOALSTATUS:25110}  4.  *** Baseline:  Goal status: {GOALSTATUS:25110}  5.  *** Baseline:  Goal status: {GOALSTATUS:25110}  6.  *** Baseline:  Goal status: {GOALSTATUS:25110}   PLAN: PT FREQUENCY: {rehab frequency:25116}  PT DURATION: {rehab duration:25117}  PLANNED INTERVENTIONS: {rehab planned interventions:25118::"Therapeutic exercises","Therapeutic activity","Neuromuscular re-education","Balance training","Gait training","Patient/Family education","Self Care","Joint mobilization"}.  PLAN FOR NEXT SESSION: ***   Hildred Laser, PT 08/20/2022, 5:08 PM

## 2022-08-21 ENCOUNTER — Ambulatory Visit: Payer: Commercial Managed Care - HMO

## 2022-08-21 ENCOUNTER — Telehealth (INDEPENDENT_AMBULATORY_CARE_PROVIDER_SITE_OTHER): Payer: No Payment, Other | Admitting: Psychiatry

## 2022-08-21 ENCOUNTER — Encounter (HOSPITAL_COMMUNITY): Payer: Self-pay | Admitting: Psychiatry

## 2022-08-21 DIAGNOSIS — F411 Generalized anxiety disorder: Secondary | ICD-10-CM

## 2022-08-21 DIAGNOSIS — F330 Major depressive disorder, recurrent, mild: Secondary | ICD-10-CM | POA: Diagnosis not present

## 2022-08-21 DIAGNOSIS — F900 Attention-deficit hyperactivity disorder, predominantly inattentive type: Secondary | ICD-10-CM

## 2022-08-21 MED ORDER — HYDROXYZINE HCL 10 MG PO TABS: 10.0000 mg | ORAL_TABLET | Freq: Three times a day (TID) | ORAL | 3 refills | Status: DC | PRN

## 2022-08-21 MED ORDER — AMPHETAMINE-DEXTROAMPHETAMINE 20 MG PO TABS: 20.0000 mg | ORAL_TABLET | Freq: Every day | ORAL | 0 refills | Status: DC

## 2022-08-21 MED ORDER — BUPROPION HCL ER (XL) 150 MG PO TB24: 150.0000 mg | ORAL_TABLET | ORAL | 3 refills | Status: DC
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
## 2022-08-21 NOTE — Therapy (Signed)
OUTPATIENT PHYSICAL THERAPY THORACOLUMBAR EVALUATION   Patient Name: [name] MRN: [id number] DOB:[birth year], 31 y.o., female Today's Date: 08/22/2022   PT End of Session - 08/22/22 1608     Visit Number 2    Number of Visits 17    Date for PT Re-Evaluation 10/24/22    Authorization Type [employer]    Authorization Time Period FOTO v6, v10    PT Start Time 1540   Pt arrived 10 minutes late to her eval   PT Stop Time 1613    PT Time Calculation (min) 33 min    Activity Tolerance Patient tolerated treatment well    Behavior During Therapy WFL for tasks assessed/performed             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   ahi 5.5 ,mslt 07/01/06(off meds) mean latency 0 , with 4 sorems npsg 04/24/2010 2.2/hr   Exogenous obesity    Narcolepsy cataplexy syndrome 2007   Prior sleep studies starting on September 26 2005, AHI 5.3 per hour,   Poor sleep hygiene    Past Surgical History:  Procedure Laterality Date   none     Patient Active Problem List   Diagnosis Date Noted   Dental abscess 02/21/2022   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 prior episode (HCC) 10/31/2021   OSA (obstructive sleep apnea) 10/15/2021   Depression, major, recurrent, mild (HCC) 04/30/2021   Generalized anxiety disorder 04/30/2021   Migraine 06/02/2017   Intercostal pain 08/25/2016   Knee pain, bilateral 07/14/2016   Venous stasis dermatitis of both lower extremities 05/26/2016   Stye external 03/25/2016   Plantar fasciitis, right 03/25/2016   Leg swelling 11/17/2015   Headache 09/08/2015   Pain of molar 05/26/2015   Sebaceous cyst of left axilla 04/13/2015   Vitamin D deficiency 03/29/2015   Allergic rhinitis 04/11/2010   Iron deficiency anemia 03/09/2010   Esophageal reflux 03/24/2009   MENORRHAGIA 03/17/2009    Morbid obesity with body mass index of 70 and over in adult [facility]) 05/15/2007   Depression 02/12/2007   Attention deficit hyperactivity disorder (ADHD), predominantly inattentive type 02/12/2007   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    PCP: Hoy Register, MD  REFERRING PROVIDER: Anders Simmonds, PA-C  REFERRING DIAG:  [phone] (ICD-10-CM) - Chronic pain of left knee  M54.50,G89.29 (ICD-10-CM) - Chronic midline low back pain without sciatica    Rationale for Evaluation and Treatment Rehabilitation  THERAPY DIAG:  Other low back pain - Plan: PT plan of care cert/re-cert  Difficulty in walking, not elsewhere classified - Plan: PT plan of care cert/re-cert  Muscle weakness (generalized) - Plan: PT plan of care cert/re-cert  Chronic pain of left knee - Plan: PT plan of care cert/re-cert  Localized edema - Plan: PT plan of care cert/re-cert  ONSET DATE: 1 year ago  SUBJECTIVE:  SUBJECTIVE STATEMENT: Pt reports primary c/o chronic LBP and Lt knee pain lasting about a year. She reports her primary concern at this time is her LBP.  Pt reports occasional N/T in her legs, although this problem has been present for several years. She reports she has poor circulation and low blood count due to morbid obesity and anemia, which may contribute to her N/T. She reports her knees have hurt for a very long time; this has been her baseline for several years. Current LBP is 7-8/10. Worst pain is 10/10. Best pain is 7/10. Aggravating factors include lifting >5 lbs, standing, walking > 3-4 minutes, sitting > 10-20 minutes. Easing factors include heating pad, muscle relaxers, Meloxicam.   PERTINENT HISTORY:  Depression, Asthma, ADHD  PAIN:  Are you having pain? Yes: NPRS scale: 7-8/10 Pain location: Low  back Pain description: achy, sharp Aggravating factors: lifting >5 lbs, standing, walking > 3-4 minutes, sitting > 10-20 minutes Relieving factors: heating pad, muscle relaxers, Meloxicam   PRECAUTIONS: None  WEIGHT BEARING RESTRICTIONS No  FALLS:  Has patient fallen in last 6 months? Yes. Number of falls 1, after tripping over bike, no injury noted  LIVING ENVIRONMENT: Lives with: lives with their family Lives in: House/apartment Stairs: No Has following equipment at home: Single point cane  OCCUPATION: Unemployed  PLOF: Independent  PATIENT GOALS Walk for exercise, lifting things at home, stand for ADLs  Screening for Suicide  Answer the following questions with Yes or No and place an "x" beside the action taken.  1. Over the past two weeks, have you felt down, depressed, or hopeless?   Yes  2. Within the past two weeks, have you felt little interest or pleasure in life?  Yes  If YES to either #1 or #2, then ask #3  3. Have you had thoughts that life is not worth living or that you might be       better off dead?  No   If answer is NO and suspicion is low, then end   4. Over this past week, have you had any thoughts about hurting or even killing yourself?    If NO, then end. Patient in no immediate danger   5. If so, do you believe that you intend to or will harm yourself?       If NO, then end. Patient in no immediate danger   6.  Do you have a plan as to how you would hurt yourself?     7.  Over this past week, have you actually done anything to hurt yourself?    IF YES answers to either #4, #5, #6 or #7, then patient is AT RISK for suicide   Actions Taken  ____  Screening negative; no further action required  __X__  Screening positive; no immediate danger and patient already in treatment with a  mental health provider. Advise patient to speak to their mental health provider.  ____  Screening positive; no immediate danger. Patient advised to  contact a mental  health provider for further assessment.   ____  Screening positive; in immediate danger as patient states intention of killing self,  has plan and a sense of imminence. Do not leave alone. Seek permission from  patient to contact a family member to inform them. Direct patient to go to ED.   OBJECTIVE:   DIAGNOSTIC FINDINGS:  08/01/2022: DG Lumbar Spine Complete: IMPRESSION: Minor L5-S1 facet hypertrophy. Otherwise negative radiographs of the lumbar spine.   PATIENT  SURVEYS:  FOTO 35%, predicted 51% in 13 visits  SCREENING FOR RED FLAGS: Bowel or bladder incontinence: No Cauda equina syndrome: No  COGNITION:  Overall cognitive status: Within functional limits for tasks assessed     SENSATION: Light touch: WFL  MUSCLE LENGTH: Hamstrings: Moderate tightness BIL   POSTURE: rounded shoulders, forward head, increased lumbar lordosis, and flexed trunk   PALPATION: TTP to BIL thoracic and lumbar paraspinals  PASSIVE ACCESSORIES: Painful CPAs T3-L5  LUMBAR ROM:   Active  AROM  eval  Flexion WNL, pain  Extension WNL, pain  Right lateral flexion WNL, pain  Left lateral flexion WNL  Right rotation WNL  Left rotation WNL   (Blank rows = not tested)  LOWER EXTREMITY ROM:     A/PROM Right eval Left eval  Knee flexion    Knee extension      LOWER EXTREMITY MMT:    MMT Right eval Left eval  Hip flexion 4+/5 4+/5  Hip extension 2/5 2/5  Hip abduction 4/5 4/5  Knee flexion 5/5 5/5  Knee extension 5/5 5/5  Ankle dorsiflexion 5/5 5/5  Ankle plantarflexion 5/5 5/5   (Blank rows = not tested)  LUMBAR SPECIAL TESTS:  Repeated trunk extension x10: (-) Slump: (-) BIL SLR: (+) on Lt  FUNCTIONAL TESTS:  5xSTS: 20 seconds with UE support Squat: 50%, BIL knee pain    GAIT: Distance walked: 20 ft Assistive device utilized: Single point cane Level of assistance: Modified independence Comments: slow gait speed, short stride  length    TODAY'S TREATMENT  08/22/2022: Demonstrated and issued HEP   PATIENT EDUCATION:  Education details: Pt educated on potential underlying pathophysiology, POC, prognosis, FOTO, and HEP Person educated: Patient Education method: Explanation, Demonstration, and Handouts Education comprehension: verbalized understanding and returned demonstration   HOME EXERCISE PROGRAM: Access Code: [email or web address] URL: [email or web address] Date: 08/22/2022 Prepared by: Carmelina Dane  Exercises - Hooklying Sequential Leg March and Lower with handhold resistance  - 1 x daily - 7 x weekly - 2 sets - 10 reps - 5 seconds hold - Sidelying Hip Abduction  - 1 x daily - 7 x weekly - 2 sets - 10 reps - 3 seconds hold - Mini Squat with Counter Support  - 1 x daily - 7 x weekly - 3 sets - 10 reps - Seated Hamstring Stretch  - 1 x daily - 7 x weekly - 2 sets - 1 minute hold  ASSESSMENT:  CLINICAL IMPRESSION: Patient is a 31 y.o. F who was seen today for physical therapy evaluation and treatment for chronic LBP and Lt knee pain.  The pt arrived 10 minutes late to her appointment. Due to time constraints, the pt opted to primarily focus on her back pain today. Upon assessment, her primary impairments include pain with trunk flexion, extension, and Rt side bend AROM, significantly weak BIL hip extension MMT, diminished overall endurance during the assessment, poor 5xSTS, and tight BIL hamstrings. Ruling up mechanical LBP due to inconclusive findings for any other specific lumbar pathology. This pain presentation is also likely affected by pt's morbid obesity. She will benefit from skilled PT to address her primary impairments and return to her prior level of function with less limitation. Assessment of knee pain is warranted at future visits.   OBJECTIVE IMPAIRMENTS Abnormal gait, decreased activity tolerance, decreased balance, decreased endurance, decreased mobility, difficulty walking, decreased  ROM, decreased strength, increased edema, impaired flexibility, improper body mechanics, postural dysfunction, obesity, and pain.   ACTIVITY LIMITATIONS carrying, lifting,  bending, sitting, standing, squatting, sleeping, stairs, transfers, bed mobility, dressing, hygiene/grooming, and locomotion level  PARTICIPATION LIMITATIONS: meal prep, cleaning, laundry, driving, shopping, community activity, and yard work  PERSONAL FACTORS Fitness and 3+ comorbidities: See medical hx  are also affecting patient's functional outcome.   REHAB POTENTIAL: Fair Due to physical fitness, morbid obesity, and time since onset of pain  CLINICAL DECISION MAKING: Stable/uncomplicated  EVALUATION COMPLEXITY: Low   GOALS: Goals reviewed with patient? Yes  SHORT TERM GOALS: Target date: 09/19/2022  Pt will report understanding and adherence to initial HEP in order to promote independence in the management of primary impairments. Baseline: HEP provided at eval Goal status: INITIAL    LONG TERM GOALS: Target date: 10/17/2022  Pt will achieve a FOTO score of 51% in order to demonstrate improved functional ability as it relates to her primary impairments. Baseline: 35% Goal status: INITIAL  2.  Pt will achieve BIL hip extension strength of 4/5 in order to promote endurance with prolonged walking for exercise. Baseline: 2/5 BIL Goal status: INITIAL  3.  Pt will report ability to stand for 10 minutes in order cook with less limitation. Baseline: Pain with any amount of standing Goal status: INITIAL  4.  Pt will report 0-4/10 pain with global lumbar AROM in order to get dressed with less limitation. Baseline: >8/10 pain in trunk flexion, extension, Rt side bend Goal status: INITIAL  5.  Pt will achieve a 5xSTS without use of hands in less than 18 seconds in order to demonstrate safe transfers for community activity. Baseline: 20 seconds with use of hands Goal status: INITIAL    PLAN: PT FREQUENCY:  2x/week  PT DURATION: 8 weeks  PLANNED INTERVENTIONS: Therapeutic exercises, Therapeutic activity, Neuromuscular re-education, Balance training, Gait training, Patient/Family education, Self Care, Joint mobilization, Joint manipulation, Stair training, Prosthetic training, Aquatic Therapy, Dry Needling, Electrical stimulation, Spinal manipulation, Spinal mobilization, Cryotherapy, Moist heat, Taping, Vasopneumatic device, Traction, Biofeedback, Ionotophoresis 4mg /ml Dexamethasone, Manual therapy, and Re-evaluation.  PLAN FOR NEXT SESSION: Progress core/ hip strengthening and mobility, assess knee pain as able   , PT, DPT 08/22/22 4:52 PM

## 2022-08-22 ENCOUNTER — Ambulatory Visit: Payer: Commercial Managed Care - HMO | Attending: Family Medicine

## 2022-08-22 ENCOUNTER — Other Ambulatory Visit: Payer: Self-pay

## 2022-08-22 DIAGNOSIS — M6281 Muscle weakness (generalized): Secondary | ICD-10-CM | POA: Diagnosis present

## 2022-08-22 DIAGNOSIS — M545 Low back pain, unspecified: Secondary | ICD-10-CM | POA: Insufficient documentation

## 2022-08-22 DIAGNOSIS — R262 Difficulty in walking, not elsewhere classified: Secondary | ICD-10-CM | POA: Insufficient documentation

## 2022-08-22 DIAGNOSIS — R600 Localized edema: Secondary | ICD-10-CM | POA: Diagnosis present

## 2022-08-22 DIAGNOSIS — M25562 Pain in left knee: Secondary | ICD-10-CM | POA: Diagnosis present

## 2022-08-22 DIAGNOSIS — M5459 Other low back pain: Secondary | ICD-10-CM | POA: Insufficient documentation

## 2022-08-22 DIAGNOSIS — G8929 Other chronic pain: Secondary | ICD-10-CM | POA: Insufficient documentation

## 2022-08-22 MED ORDER — METHYLPREDNISOLONE 4 MG PO TBPK
ORAL_TABLET | ORAL | 0 refills | Status: DC
  Filled 2022-08-22: qty 21, 6d supply, fill #0

## 2022-08-22 NOTE — Patient Instructions (Signed)
Aquatic Therapy at [facility]-  What to Expect!  Where:   [facility] [address] [facility] [place], [place] [address] Rehab phone [phone]  NOTE:  You will receive an automated phone message reminding you of your appt and it will say the appointment is at the [address] [facility] [address] [facility] clinic.          How to Prepare: Please make sure you drink 8 ounces of water about one hour prior to your pool session A caregiver may attend if needed with the patient to help assist as needed. A caregiver can sit in the pool room on chair. Please arrive IN YOUR SUIT and 15 minutes prior to your appointment - this helps to avoid delays in starting your session. Please make sure to attend to any toileting needs prior to entering the pool Locker rooms for changing are provided.   There is direct access to the pool deck form the locker room.  You can lock your belongings in a locker with lock provided. Once on the pool deck your therapist will ask if you have signed the Patient  Consent and Assignment of Benefits form before beginning treatment Your therapist may take your blood pressure prior to, during and after your session if indicated We usually try and create a home exercise program based on activities we do in the pool.  Please be thinking about who might be able to assist you in the pool should you need to participate in an aquatic home exercise program at the time of discharge if you need assistance.  Some patients do not want to or do not have the ability to participate in an aquatic home program - this is not a barrier in any way to you participating in aquatic therapy as part of your current therapy plan! After Discharge from PT, you can continue using home program at  the  Aquatic Center/, there is a drop-in fee for $5 ($45 a month)or for 60 years  or older $4.00 ($40 a month for seniors ) or any local [employer] pool.  Memberships for purchase are  available for gym/pool at [facility]  IT IS VERY IMPORTANT THAT YOUR LAST VISIT BE IN THE CLINIC AT [address] AFTER YOUR LAST AQUATIC VISIT.  PLEASE MAKE SURE THAT YOU HAVE A [address]  APPOINTMENT SCHEDULED.   About the pool: Pool is located approximately 500 FT from the entrance of the building.  Please bring a support person if you need assistance traveling this      distance.   Your therapist will assist you in entering the water; there are two ways to           enter: stairs with railings, and a mechanical lift. Your therapist will determine the most appropriate way for you.  Water temperature is usually between 88-90 degrees  There may be up to 2 other swimmers in the pool at the same time  The pool deck is tile, please wear shoes with good traction if you prefer not to be barefoot.    Contact Info:  For appointment scheduling and cancellations:         Please call the [facility]  PH:[phone]              Aquatic Therapy  [facility]patient [facility]       All sessions are 45 minutes                                                    

## 2022-08-23 ENCOUNTER — Ambulatory Visit: Payer: Commercial Managed Care - HMO

## 2022-08-23 DIAGNOSIS — R262 Difficulty in walking, not elsewhere classified: Secondary | ICD-10-CM

## 2022-08-23 DIAGNOSIS — M5459 Other low back pain: Secondary | ICD-10-CM

## 2022-08-23 DIAGNOSIS — M6281 Muscle weakness (generalized): Secondary | ICD-10-CM

## 2022-08-23 DIAGNOSIS — G8929 Other chronic pain: Secondary | ICD-10-CM

## 2022-08-23 DIAGNOSIS — R600 Localized edema: Secondary | ICD-10-CM

## 2022-08-23 NOTE — Therapy (Signed)
OUTPATIENT PHYSICAL THERAPY TREATMENT NOTE   Patient Name: [name] MRN: [id number] DOB:Mar 05, 1991, 31 y.o., female Today's Date: 08/23/2022  PCP: Hoy Register, MD REFERRING PROVIDER: Anders Simmonds, PA-C  END OF SESSION:   PT End of Session - 08/23/22 1612     Visit Number 3    Number of Visits 17    Date for PT Re-Evaluation 10/24/22    Authorization Type [employer]    Authorization Time Period FOTO v6, v10    PT Start Time 1612    PT Stop Time 1657    PT Time Calculation (min) 45 min    Activity Tolerance Patient tolerated treatment well    Behavior During Therapy WFL for tasks assessed/performed             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   ahi 5.5 ,mslt 07/01/06(off meds) mean latency 0 , with 4 sorems npsg 04/24/2010 2.2/hr   Exogenous obesity    Narcolepsy cataplexy syndrome 2007   Prior sleep studies starting on September 26 2005, AHI 5.3 per hour,   Poor sleep hygiene    Past Surgical History:  Procedure Laterality Date   none     Patient Active Problem List   Diagnosis Date Noted   Dental abscess 02/21/2022   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 prior episode (HCC) 10/31/2021   OSA (obstructive sleep apnea) 10/15/2021   Depression, major, recurrent, mild (HCC) 04/30/2021   Generalized anxiety disorder 04/30/2021   Migraine 06/02/2017   Intercostal pain 08/25/2016   Knee pain, bilateral 07/14/2016   Venous stasis dermatitis of both lower extremities 05/26/2016   Stye external 03/25/2016   Plantar fasciitis, right 03/25/2016   Leg swelling 11/17/2015   Headache 09/08/2015   Pain of molar 05/26/2015   Sebaceous cyst of left axilla 04/13/2015   Vitamin D deficiency 03/29/2015   Allergic rhinitis 04/11/2010   Iron deficiency anemia 03/09/2010   Esophageal reflux  03/24/2009   MENORRHAGIA 03/17/2009   Morbid obesity with body mass index of 70 and over in adult [facility]) 05/15/2007   Depression 02/12/2007   Attention deficit hyperactivity disorder (ADHD), predominantly inattentive type 02/12/2007   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   PAIN:  Are you having pain? Yes: NPRS scale: 7/10 low back, 6/10 L knee Pain location: Low back Pain description: achy, sharp Aggravating factors: lifting >5 lbs, standing, walking > 3-4 minutes, sitting > 10-20 minutes Relieving factors: heating pad, muscle relaxers, Meloxicam   OBJECTIVE: (objective measures completed at initial evaluation unless otherwise dated)   DIAGNOSTIC FINDINGS:  08/01/2022: DG Lumbar Spine Complete: IMPRESSION: Minor L5-S1 facet hypertrophy. Otherwise negative radiographs of the lumbar spine.     PATIENT SURVEYS:  FOTO 35%, predicted 51% in 13 visits   SCREENING FOR RED FLAGS: Bowel or bladder incontinence: No Cauda equina syndrome: No  COGNITION:           Overall cognitive status: Within functional limits for tasks assessed                          SENSATION: Light touch: WFL   MUSCLE LENGTH: Hamstrings: Moderate tightness BIL     POSTURE: rounded shoulders, forward head, increased lumbar lordosis, and flexed trunk    PALPATION: TTP to BIL thoracic and lumbar paraspinals   PASSIVE ACCESSORIES: Painful CPAs T3-L5   LUMBAR ROM:    Active  AROM  eval  Flexion WNL, pain   Extension WNL, pain  Right lateral flexion WNL, pain  Left lateral flexion WNL  Right rotation WNL  Left rotation WNL   (Blank rows = not tested)   LOWER EXTREMITY ROM:      A/PROM Right eval Left eval  Knee flexion      Knee extension          LOWER EXTREMITY MMT:     MMT Right eval Left eval  Hip flexion 4+/5 4+/5  Hip extension 2/5 2/5  Hip abduction 4/5 4/5  Knee flexion 5/5 5/5  Knee extension 5/5 5/5  Ankle dorsiflexion 5/5 5/5  Ankle plantarflexion 5/5 5/5   (Blank rows = not tested)   LUMBAR SPECIAL TESTS:  Repeated trunk extension x10: (-) Slump: (-) BIL SLR: (+) on Lt   FUNCTIONAL TESTS:  5xSTS: 20 seconds with UE support Squat: 50%, BIL knee pain       GAIT: Distance walked: 20 ft Assistive device utilized: Single point cane Level of assistance: Modified independence Comments: slow gait speed, short stride length       TODAY'S TREATMENT  [facility] Adult PT Treatment:                                                DATE: 08/23/2022 Aquatic therapy at [facility]- [address] - therapeutic pool temp 92 degrees Pt enters building ambulating with SPC  Treatment took place in water 3.8 to  4 ft 8 in.feet deep depending upon activity.  Pt entered and exited the pool via stair and handrails independently.  Pt pain level 6-7/10 at initiation of water walking.  Patient entered water for aquatic therapy for first time and was introduced to principles and therapeutic effects of water as they ambulated and acclimated to pool.  Therapeutic Exercise: Walking forward/backwards/side stepping Runners stretch on bottom step x30" BIL Hamstring stretch on bottom step x30" BIL At edge of pool, pt performed following exercise: Hip abd/add x20 BIL Hip ext/flex with knee straight x 20 BIL Hip Circles CC/CCW 2x10 each BIL Marching hip flexion to knee extension 2x10 BIL Hamstring curl x20 BIL Squats 2x20 Wall push ups 2x10 Step ups on submerged step 2x10  BIL  Pt requires the buoyancy of water for active assisted exercises with buoyancy supported for strengthening and AROM exercises. Hydrostatic pressure also supports joints by unweighting joint load by at least 50 % in 3-4 feet depth water. 80% in chest to neck deep water. Water will provide assistance with movement using the current and laminar flow while the buoyancy reduces weight bearing. Pt requires the viscosity of the water for resistance with strengthening exercises.   08/22/2022: Demonstrated and issued HEP     PATIENT EDUCATION:  Education details: Pt educated  on potential underlying pathophysiology, POC, prognosis, FOTO, and HEP Person educated: Patient Education method: Explanation, Demonstration, and Handouts Education comprehension: verbalized understanding and returned demonstration     HOME EXERCISE PROGRAM: Access Code: [email or web address] URL: [email or web address] Date: 08/22/2022 Prepared by: Carmelina Dane   Exercises - Hooklying Sequential Leg March and Lower with handhold resistance  - 1 x daily - 7 x weekly - 2 sets - 10 reps - 5 seconds hold - Sidelying Hip Abduction  - 1 x daily - 7 x weekly - 2 sets - 10 reps - 3 seconds hold - Mini Squat with Counter Support  - 1 x daily - 7 x weekly - 3 sets - 10 reps - Seated Hamstring Stretch  - 1 x daily - 7 x weekly - 2 sets - 1 minute hold   ASSESSMENT:   CLINICAL IMPRESSION: Patient presents to her first aquatic PT session with continued reports of low back and Lt knee pain. Session today focused on BIL LE strengthening and general conditioning in the aquatic environment for use of buoyancy to offload joints and the viscosity of water as resistance during therapeutic exercise. Patient was able to tolerate all prescribed exercises in the aquatic environment with no adverse effects and reports 8/10 pain at the end of the session. Patient continues to benefit from skilled PT services on land and aquatic based and  should be progressed as able to improve functional independence.    OBJECTIVE IMPAIRMENTS Abnormal gait, decreased activity tolerance, decreased balance, decreased endurance, decreased mobility, difficulty walking, decreased ROM, decreased strength, increased edema, impaired flexibility, improper body mechanics, postural dysfunction, obesity, and pain.    ACTIVITY LIMITATIONS carrying, lifting, bending, sitting, standing, squatting, sleeping, stairs, transfers, bed mobility, dressing, hygiene/grooming, and locomotion level   PARTICIPATION LIMITATIONS: meal prep, cleaning, laundry, driving, shopping, community activity, and yard work   PERSONAL FACTORS [place] and 3+ comorbidities: See medical hx  are also affecting patient's functional outcome.    REHAB POTENTIAL: Fair Due to physical fitness, morbid obesity, and time since onset of pain   CLINICAL DECISION MAKING: Stable/uncomplicated   EVALUATION COMPLEXITY: Low     GOALS: Goals reviewed with patient? Yes   SHORT TERM GOALS: Target date: 09/19/2022   Pt will report understanding and adherence to initial HEP in order to promote independence in the management of primary impairments. Baseline: HEP provided at eval Goal status: INITIAL       LONG TERM GOALS: Target date: 10/17/2022   Pt will achieve a FOTO score of 51% in order to demonstrate improved functional ability as it relates to her primary impairments. Baseline: 35% Goal status: INITIAL   2.  Pt will achieve BIL hip extension strength of 4/5 in order to promote endurance with prolonged walking for exercise. Baseline: 2/5 BIL Goal status: INITIAL   3.  Pt will report ability to stand for 10 minutes in order cook with less limitation. Baseline: Pain with any amount of standing Goal status: INITIAL   4.  Pt will report 0-4/10 pain with global lumbar AROM in order to get dressed with less limitation. Baseline: >8/10 pain in trunk flexion, extension, Rt side bend Goal  status: INITIAL   5.  Pt will achieve a 5xSTS without use of hands in less than 18 seconds in order to demonstrate safe transfers for community activity. Baseline: 20 seconds with use of hands Goal status: INITIAL       PLAN: PT FREQUENCY: 2x/week   PT DURATION: 8 weeks   PLANNED  INTERVENTIONS: Therapeutic exercises, Therapeutic activity, Neuromuscular re-education, Balance training, Gait training, Patient/Family education, Self Care, Joint mobilization, Joint manipulation, Stair training, Prosthetic training, Aquatic Therapy, Dry Needling, Electrical stimulation, Spinal manipulation, Spinal mobilization, Cryotherapy, Moist heat, Taping, Vasopneumatic device, Traction, Biofeedback, Ionotophoresis 4mg /ml Dexamethasone, Manual therapy, and Re-evaluation.   PLAN FOR NEXT SESSION: Progress core/ hip strengthening and mobility, assess knee pain as able    , PTA 08/23/2022, 5:06 PM

## 2022-08-27 ENCOUNTER — Ambulatory Visit: Payer: Commercial Managed Care - HMO

## 2022-08-27 DIAGNOSIS — M5459 Other low back pain: Secondary | ICD-10-CM | POA: Diagnosis not present

## 2022-08-27 DIAGNOSIS — R262 Difficulty in walking, not elsewhere classified: Secondary | ICD-10-CM

## 2022-08-27 DIAGNOSIS — M6281 Muscle weakness (generalized): Secondary | ICD-10-CM

## 2022-08-27 DIAGNOSIS — R600 Localized edema: Secondary | ICD-10-CM

## 2022-08-27 DIAGNOSIS — G8929 Other chronic pain: Secondary | ICD-10-CM

## 2022-08-27 NOTE — Therapy (Signed)
OUTPATIENT PHYSICAL THERAPY TREATMENT NOTE   Patient Name: [name] MRN: [id number] DOB:03/24/1991, 31 y.o., female Today's Date: 08/27/2022  PCP: Hoy Register, MD REFERRING PROVIDER: Anders Simmonds, PA-C  END OF SESSION:   PT End of Session - 08/27/22 1452     Visit Number 3    Number of Visits 17    Date for PT Re-Evaluation 10/24/22    Authorization Type [employer]    Authorization Time Period FOTO v6, v10    PT Start Time 1452    PT Stop Time 1530    PT Time Calculation (min) 38 min    Activity Tolerance Patient tolerated treatment well;Patient limited by fatigue    Behavior During Therapy [facility] for tasks assessed/performed              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   ahi 5.5 ,mslt 07/01/06(off meds) mean latency 0 , with 4 sorems npsg 04/24/2010 2.2/hr   Exogenous obesity    Narcolepsy cataplexy syndrome 2007   Prior sleep studies starting on September 26 2005, AHI 5.3 per hour,   Poor sleep hygiene    Past Surgical History:  Procedure Laterality Date   none     Patient Active Problem List   Diagnosis Date Noted   Dental abscess 02/21/2022   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 prior episode (HCC) 10/31/2021   OSA (obstructive sleep apnea) 10/15/2021   Depression, major, recurrent, mild (HCC) 04/30/2021   Generalized anxiety disorder 04/30/2021   Migraine 06/02/2017   Intercostal pain 08/25/2016   Knee pain, bilateral 07/14/2016   Venous stasis dermatitis of both lower extremities 05/26/2016   Stye external 03/25/2016   Plantar fasciitis, right 03/25/2016   Leg swelling 11/17/2015   Headache 09/08/2015   Pain of molar 05/26/2015   Sebaceous cyst of left axilla 04/13/2015   Vitamin D deficiency 03/29/2015   Allergic rhinitis 04/11/2010   Iron deficiency anemia  03/09/2010   Esophageal reflux 03/24/2009   MENORRHAGIA 03/17/2009   Morbid obesity with body mass index of 70 and over in adult [facility]) 05/15/2007   Depression 02/12/2007   Attention deficit hyperactivity disorder (ADHD), predominantly inattentive type 02/12/2007   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   PAIN:  Are you having pain? Yes: NPRS scale: 6/10 low back, 8/10 L knee Pain location: Low back Pain description: achy, sharp Aggravating factors: lifting >5 lbs, standing, walking > 3-4 minutes, sitting > 10-20 minutes Relieving factors: heating pad, muscle relaxers, Meloxicam   OBJECTIVE: (objective measures completed at initial evaluation unless otherwise dated)   DIAGNOSTIC FINDINGS:  08/01/2022: DG Lumbar Spine Complete: IMPRESSION: Minor L5-S1 facet hypertrophy. Otherwise negative radiographs of the lumbar spine.     PATIENT SURVEYS:  FOTO 35%, predicted 51% in 13 visits   SCREENING  FOR RED FLAGS: Bowel or bladder incontinence: No Cauda equina syndrome: No   COGNITION:           Overall cognitive status: Within functional limits for tasks assessed                          SENSATION: Light touch: WFL   MUSCLE LENGTH: Hamstrings: Moderate tightness BIL     POSTURE: rounded shoulders, forward head, increased lumbar lordosis, and flexed trunk    PALPATION: TTP to BIL thoracic and lumbar paraspinals   PASSIVE  ACCESSORIES: Painful CPAs T3-L5   LUMBAR ROM:    Active  AROM  eval  Flexion WNL, pain  Extension WNL, pain  Right lateral flexion WNL, pain  Left lateral flexion WNL  Right rotation WNL  Left rotation WNL   (Blank rows = not tested)   LOWER EXTREMITY ROM:      A/PROM Right eval Left eval  Knee flexion      Knee extension          LOWER EXTREMITY MMT:     MMT Right eval Left eval  Hip flexion 4+/5 4+/5  Hip extension 2/5 2/5  Hip abduction 4/5 4/5  Knee flexion 5/5 5/5  Knee extension 5/5 5/5  Ankle dorsiflexion 5/5 5/5  Ankle plantarflexion 5/5 5/5   (Blank rows = not tested)   LUMBAR SPECIAL TESTS:  Repeated trunk extension x10: (-) Slump: (-) BIL SLR: (+) on Lt   FUNCTIONAL TESTS:  5xSTS: 20 seconds with UE support Squat: 50%, BIL knee pain       GAIT: Distance walked: 20 ft Assistive device utilized: Single point cane Level of assistance: Modified independence Comments: slow gait speed, short stride length       TODAY'S TREATMENT  [facility] Adult PT Treatment:                                                DATE: 08/27/2022 Therapeutic Exercise: Seated marching 2x10 BIL LAQ 3x10 BIL Seated hamstring curl GTB 2x10 BIL Mini squats 2x10 Seated hamstring stretch 2x30" BIL Bridges 2x10 Supine marching 2x10 BIL Supine alternating clamshell 2x10 BIL LTR x10 BIL STS 2x10   [facility] Adult PT Treatment:                                                DATE: 08/23/2022 Aquatic therapy at [address] - therapeutic pool temp 92 degrees Pt enters building ambulating with SPC  Treatment took place in water 3.8 to  4 ft 8 in.feet deep depending upon activity.  Pt entered and exited the pool via stair and handrails independently.  Pt pain level 6-7/10 at initiation of water walking.  Patient entered water for aquatic therapy for first time and was introduced to principles and therapeutic effects of water as they ambulated and acclimated to  pool.  Therapeutic Exercise: Walking forward/backwards/side stepping Runners stretch on bottom step x30" BIL Hamstring stretch on bottom step x30" BIL At edge of pool, pt performed following exercise: Hip abd/add x20 BIL Hip ext/flex with knee straight x 20 BIL Hip Circles CC/CCW 2x10 each BIL Marching hip flexion to knee extension 2x10 BIL Hamstring curl  x20 BIL Squats 2x20 Wall push ups 2x10 Step ups on submerged step 2x10 BIL  Pt requires the buoyancy of water for active assisted exercises with buoyancy supported for strengthening and AROM exercises. Hydrostatic pressure also supports joints by unweighting joint load by at least 50 % in 3-4 feet depth water. 80% in chest to neck deep water. Water will provide assistance with movement using the current and laminar flow while the buoyancy reduces weight bearing. Pt requires the viscosity of the water for resistance with strengthening exercises.   08/22/2022: Demonstrated and issued HEP     PATIENT EDUCATION:  Education details: Pt educated on potential underlying pathophysiology, POC, prognosis, FOTO, and HEP Person educated: Patient Education method: Explanation, Demonstration, and Handouts Education comprehension: verbalized understanding and returned demonstration     HOME EXERCISE PROGRAM: Access Code: [email or web address] URL: [email or web address] Date: 08/22/2022 Prepared by: Vanessa Pastoria   Exercises - Hooklying Sequential Leg March and Lower with handhold resistance  - 1 x daily - 7 x weekly - 2 sets - 10 reps - 5 seconds hold - Sidelying Hip Abduction  - 1 x daily - 7 x weekly - 2 sets - 10 reps - 3 seconds hold - Mini Squat with Counter Support  - 1 x daily - 7 x weekly - 3 sets - 10 reps - Seated Hamstring Stretch  - 1 x daily - 7 x weekly - 2 sets - 1 minute hold   ASSESSMENT:   CLINICAL IMPRESSION: Patient presents to PT with high pain in her L knee and moderate pain in her lower back. Session today  focused on BIL LE and proximal hip strengthening. She is limited throughout session due to weakness and body habitus as well as general fatigue. She stated she took her ADHD medication before session, but that it wasn't keeping her as awake as it normally does. Patient continues to benefit from skilled PT services and should be progressed as able to improve functional independence.      OBJECTIVE IMPAIRMENTS Abnormal gait, decreased activity tolerance, decreased balance, decreased endurance, decreased mobility, difficulty walking, decreased ROM, decreased strength, increased edema, impaired flexibility, improper body mechanics, postural dysfunction, obesity, and pain.    ACTIVITY LIMITATIONS carrying, lifting, bending, sitting, standing, squatting, sleeping, stairs, transfers, bed mobility, dressing, hygiene/grooming, and locomotion level   PARTICIPATION LIMITATIONS: meal prep, cleaning, laundry, driving, shopping, community activity, and yard work   [place] and 3+ comorbidities: See medical hx  are also affecting patient's functional outcome.    REHAB POTENTIAL: Fair Due to physical fitness, morbid obesity, and time since onset of pain   CLINICAL DECISION MAKING: Stable/uncomplicated   EVALUATION COMPLEXITY: Low     GOALS: Goals reviewed with patient? Yes   SHORT TERM GOALS: Target date: 09/19/2022   Pt will report understanding and adherence to initial HEP in order to promote independence in the management of primary impairments. Baseline: HEP provided at eval Goal status: INITIAL       LONG TERM GOALS: Target date: 10/17/2022   Pt will achieve a FOTO score of 51% in order to demonstrate improved functional ability as it relates to her primary impairments. Baseline: 35% Goal status: INITIAL   2.  Pt will achieve BIL hip extension strength of 4/5 in order to promote endurance with prolonged walking for exercise. Baseline: 2/5 BIL Goal status: INITIAL   3.  Pt  will report ability to stand for 10 minutes in order cook with less limitation. Baseline: Pain with any amount  of standing Goal status: INITIAL   4.  Pt will report 0-4/10 pain with global lumbar AROM in order to get dressed with less limitation. Baseline: >8/10 pain in trunk flexion, extension, Rt side bend Goal status: INITIAL   5.  Pt will achieve a 5xSTS without use of hands in less than 18 seconds in order to demonstrate safe transfers for community activity. Baseline: 20 seconds with use of hands Goal status: INITIAL       PLAN: PT FREQUENCY: 2x/week   PT DURATION: 8 weeks   PLANNED INTERVENTIONS: Therapeutic exercises, Therapeutic activity, Neuromuscular re-education, Balance training, Gait training, Patient/Family education, Self Care, Joint mobilization, Joint manipulation, Stair training, Prosthetic training, Aquatic Therapy, Dry Needling, Electrical stimulation, Spinal manipulation, Spinal mobilization, Cryotherapy, Moist heat, Taping, Vasopneumatic device, Traction, Biofeedback, Ionotophoresis 4mg /ml Dexamethasone, Manual therapy, and Re-evaluation.   PLAN FOR NEXT SESSION: Progress core/ hip strengthening and mobility, assess knee pain as able    , PTA 08/27/2022, 3:29 PM

## 2022-08-28 ENCOUNTER — Other Ambulatory Visit: Payer: Self-pay

## 2022-08-30 ENCOUNTER — Ambulatory Visit: Payer: Commercial Managed Care - HMO

## 2022-08-30 DIAGNOSIS — R262 Difficulty in walking, not elsewhere classified: Secondary | ICD-10-CM

## 2022-08-30 DIAGNOSIS — M5459 Other low back pain: Secondary | ICD-10-CM

## 2022-08-30 DIAGNOSIS — M6281 Muscle weakness (generalized): Secondary | ICD-10-CM

## 2022-08-30 DIAGNOSIS — R600 Localized edema: Secondary | ICD-10-CM

## 2022-08-30 DIAGNOSIS — G8929 Other chronic pain: Secondary | ICD-10-CM

## 2022-08-30 NOTE — Therapy (Signed)
OUTPATIENT PHYSICAL THERAPY TREATMENT NOTE   Patient Name: [name] MRN: [id number] DOB:[birth year], 31 y.o., female Today's Date: 08/30/2022  PCP: Hoy Register, MD REFERRING PROVIDER: Anders Simmonds, PA-C  END OF SESSION:   PT End of Session - 08/30/22 1522     Visit Number 4    Number of Visits 17    Date for PT Re-Evaluation 10/24/22    Authorization Type C[employer]    Authorization Time Period FOTO v6, v10    PT Start Time 1520    PT Stop Time 1600    PT Time Calculation (min) 40 min    Activity Tolerance Patient tolerated treatment well;Patient limited by fatigue    Behavior During Therapy [facility] for tasks assessed/performed               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   ahi 5.5 ,mslt 07/01/06(off meds) mean latency 0 , with 4 sorems npsg 04/24/2010 2.2/hr   Exogenous obesity    Narcolepsy cataplexy syndrome 2007   Prior sleep studies starting on September 26 2005, AHI 5.3 per hour,   Poor sleep hygiene    Past Surgical History:  Procedure Laterality Date   none     Patient Active Problem List   Diagnosis Date Noted   Dental abscess 02/21/2022   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 prior episode (HCC) 10/31/2021   OSA (obstructive sleep apnea) 10/15/2021   Depression, major, recurrent, mild (HCC) 04/30/2021   Generalized anxiety disorder 04/30/2021   Migraine 06/02/2017   Intercostal pain 08/25/2016   Knee pain, bilateral 07/14/2016   Venous stasis dermatitis of both lower extremities 05/26/2016   Stye external 03/25/2016   Plantar fasciitis, right 03/25/2016   Leg swelling 11/17/2015   Headache 09/08/2015   Pain of molar 05/26/2015   Sebaceous cyst of left axilla 04/13/2015   Vitamin D deficiency 03/29/2015   Allergic rhinitis 04/11/2010   Iron deficiency anemia  03/09/2010   Esophageal reflux 03/24/2009   MENORRHAGIA 03/17/2009   Morbid obesity with body mass index of 70 and over in adult [facility]) 05/15/2007   Depression 02/12/2007   Attention deficit hyperactivity disorder (ADHD), predominantly inattentive type 02/12/2007   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  PAIN:  Are you having pain? Yes: NPRS scale: 5/10 low back, 6/10 L knee Pain location: Low back Pain description: achy, sharp Aggravating factors: lifting >5 lbs, standing, walking > 3-4 minutes, sitting > 10-20 minutes Relieving factors: heating pad, muscle relaxers, Meloxicam   OBJECTIVE: (objective measures completed at initial evaluation unless otherwise dated)   DIAGNOSTIC FINDINGS:  08/01/2022: DG Lumbar Spine Complete: IMPRESSION: Minor L5-S1 facet hypertrophy. Otherwise negative radiographs of the lumbar spine.     PATIENT SURVEYS:  FOTO 35%, predicted  51% in 13 visits   SCREENING FOR RED FLAGS: Bowel or bladder incontinence: No Cauda equina syndrome: No   COGNITION:           Overall cognitive status: Within functional limits for tasks assessed                          SENSATION: Light touch: WFL   MUSCLE LENGTH: Hamstrings: Moderate tightness BIL     POSTURE: rounded shoulders, forward head, increased lumbar lordosis, and flexed trunk    PALPATION: TTP to BIL thoracic and lumbar  paraspinals   PASSIVE ACCESSORIES: Painful CPAs T3-L5   LUMBAR ROM:    Active  AROM  eval  Flexion WNL, pain  Extension WNL, pain  Right lateral flexion WNL, pain  Left lateral flexion WNL  Right rotation WNL  Left rotation WNL   (Blank rows = not tested)   LOWER EXTREMITY ROM:      A/PROM Right eval Left eval  Knee flexion      Knee extension          LOWER EXTREMITY MMT:     MMT Right eval Left eval  Hip flexion 4+/5 4+/5  Hip extension 2/5 2/5  Hip abduction 4/5 4/5  Knee flexion 5/5 5/5  Knee extension 5/5 5/5  Ankle dorsiflexion 5/5 5/5  Ankle plantarflexion 5/5 5/5   (Blank rows = not tested)   LUMBAR SPECIAL TESTS:  Repeated trunk extension x10: (-) Slump: (-) BIL SLR: (+) on Lt   FUNCTIONAL TESTS:  5xSTS: 20 seconds with UE support Squat: 50%, BIL knee pain       GAIT: Distance walked: 20 ft Assistive device utilized: Single point cane Level of assistance: Modified independence Comments: slow gait speed, short stride length       TODAY'S TREATMENT  [facility] Adult PT Treatment:                                                DATE: 08/30/2022 Aquatic therapy at [facility]- [address] - therapeutic pool temp 92 degrees Pt enters building ambulating with SPC  Treatment took place in water 3.8 to  4 ft 8 in.feet deep depending upon activity.  Pt entered and exited the pool via stair and handrails independently.  Pt pain level 5-6/10 at initiation of water walking.  Therapeutic Exercise: Walking forward/backwards/side stepping x 4 laps each Runners stretch on bottom step x30" BIL Hamstring stretch on bottom step x30" BIL Standing trunk rotation with yellow noodle x1' At edge of pool, pt performed following exercise: Hip abd/add x20 BIL Hip ext/flex with knee straight x 20 BIL Hip Circles CC/CCW 2x10 each BIL Marching hip flexion to knee extension 2x10 BIL Hamstring curl x20 BIL Squats 2x20 Wall push ups 2x15  Pt requires the buoyancy  of water for active assisted exercises with buoyancy supported for strengthening and AROM exercises. Hydrostatic pressure also supports joints by unweighting joint load by at least 50 % in 3-4 feet depth water. 80% in chest to neck deep water. Water will provide assistance with movement using the current and laminar flow while the buoyancy reduces weight bearing. Pt requires the viscosity of the water for resistance with strengthening exercises.  [facility] Adult PT Treatment:  DATE: 08/27/2022 Therapeutic Exercise: Seated marching 2x10 BIL LAQ 3x10 BIL Seated hamstring curl GTB 2x10 BIL Mini squats 2x10 Seated hamstring stretch 2x30" BIL Bridges 2x10 Supine marching 2x10 BIL Supine alternating clamshell 2x10 BIL LTR x10 BIL STS 2x10   [facility] Adult PT Treatment:                                                DATE: 08/23/2022 Aquatic therapy at [facility]- [address] - therapeutic pool temp 92 degrees Pt enters building ambulating with SPC  Treatment took place in water 3.8 to  4 ft 8 in.feet deep depending upon activity.  Pt entered and exited the pool via stair and handrails independently.  Pt pain level 6-7/10 at initiation of water walking.  Patient entered water for aquatic therapy for first time and was introduced to principles and therapeutic effects of water as they ambulated and acclimated to pool.  Therapeutic Exercise: Walking forward/backwards/side stepping Runners stretch on bottom step x30" BIL Hamstring stretch on bottom step x30" BIL At edge of pool, pt performed following exercise: Hip abd/add x20 BIL Hip ext/flex with knee straight x 20 BIL Hip Circles CC/CCW 2x10 each BIL Marching hip flexion to knee extension 2x10 BIL Hamstring curl x20 BIL Squats 2x20 Wall push ups 2x10 Step ups on submerged step 2x10 BIL  Pt requires the buoyancy of water for active assisted exercises with buoyancy supported for strengthening and  AROM exercises. Hydrostatic pressure also supports joints by unweighting joint load by at least 50 % in 3-4 feet depth water. 80% in chest to neck deep water. Water will provide assistance with movement using the current and laminar flow while the buoyancy reduces weight bearing. Pt requires the viscosity of the water for resistance with strengthening exercises.     PATIENT EDUCATION:  Education details: Pt educated on potential underlying pathophysiology, POC, prognosis, FOTO, and HEP Person educated: Patient Education method: Explanation, Demonstration, and Handouts Education comprehension: verbalized understanding and returned demonstration     HOME EXERCISE PROGRAM: Access Code: [email or web address] URL: [email or web address] Date: 08/22/2022 Prepared by: Carmelina Dane   Exercises - Hooklying Sequential Leg March and Lower with handhold resistance  - 1 x daily - 7 x weekly - 2 sets - 10 reps - 5 seconds hold - Sidelying Hip Abduction  - 1 x daily - 7 x weekly - 2 sets - 10 reps - 3 seconds hold - Mini Squat with Counter Support  - 1 x daily - 7 x weekly - 3 sets - 10 reps - Seated Hamstring Stretch  - 1 x daily - 7 x weekly - 2 sets - 1 minute hold   ASSESSMENT:   CLINICAL IMPRESSION: Patient presents to aquatic PT session 5 minutes late, truncating session, reporting 5-6/10 pain prior to entering water. Session today focused on BIL LE strengthening and general conditioning in the aquatic environment for use of buoyancy to offload joints and the viscosity of water as resistance during therapeutic exercise. Patient was able to tolerate all prescribed exercises in the aquatic environment with no adverse effects. Patient continues to benefit from skilled PT services on land and aquatic based and should be progressed as able to improve functional independence.     OBJECTIVE IMPAIRMENTS Abnormal gait, decreased activity tolerance, decreased balance, decreased endurance, decreased  mobility, difficulty walking, decreased ROM, decreased strength, increased edema, impaired  flexibility, improper body mechanics, postural dysfunction, obesity, and pain.    ACTIVITY LIMITATIONS carrying, lifting, bending, sitting, standing, squatting, sleeping, stairs, transfers, bed mobility, dressing, hygiene/grooming, and locomotion level   PARTICIPATION LIMITATIONS: meal prep, cleaning, laundry, driving, shopping, community activity, and yard work   PERSONAL FACTORS Fitness and 3+ comorbidities: See medical hx  are also affecting patient's functional outcome.    REHAB POTENTIAL: Fair Due to physical fitness, morbid obesity, and time since onset of pain   CLINICAL DECISION MAKING: Stable/uncomplicated   EVALUATION COMPLEXITY: Low     GOALS: Goals reviewed with patient? Yes   SHORT TERM GOALS: Target date: 09/19/2022   Pt will report understanding and adherence to initial HEP in order to promote independence in the management of primary impairments. Baseline: HEP provided at eval Goal status: INITIAL       LONG TERM GOALS: Target date: 10/17/2022   Pt will achieve a FOTO score of 51% in order to demonstrate improved functional ability as it relates to her primary impairments. Baseline: 35% Goal status: INITIAL   2.  Pt will achieve BIL hip extension strength of 4/5 in order to promote endurance with prolonged walking for exercise. Baseline: 2/5 BIL Goal status: INITIAL   3.  Pt will report ability to stand for 10 minutes in order cook with less limitation. Baseline: Pain with any amount of standing Goal status: INITIAL   4.  Pt will report 0-4/10 pain with global lumbar AROM in order to get dressed with less limitation. Baseline: >8/10 pain in trunk flexion, extension, Rt side bend Goal status: INITIAL   5.  Pt will achieve a 5xSTS without use of hands in less than 18 seconds in order to demonstrate safe transfers for community activity. Baseline: 20 seconds with use of  hands Goal status: INITIAL       PLAN: PT FREQUENCY: 2x/week   PT DURATION: 8 weeks   PLANNED INTERVENTIONS: Therapeutic exercises, Therapeutic activity, Neuromuscular re-education, Balance training, Gait training, Patient/Family education, Self Care, Joint mobilization, Joint manipulation, Stair training, Prosthetic training, Aquatic Therapy, Dry Needling, Electrical stimulation, Spinal manipulation, Spinal mobilization, Cryotherapy, Moist heat, Taping, Vasopneumatic device, Traction, Biofeedback, Ionotophoresis 4mg /ml Dexamethasone, Manual therapy, and Re-evaluation.   PLAN FOR NEXT SESSION: Progress core/ hip strengthening and mobility, assess knee pain as able    , PTA 08/30/2022, 3:22 PM

## 2022-09-03 ENCOUNTER — Ambulatory Visit: Payer: Commercial Managed Care - HMO

## 2022-09-03 DIAGNOSIS — M5459 Other low back pain: Secondary | ICD-10-CM | POA: Diagnosis not present

## 2022-09-03 DIAGNOSIS — G8929 Other chronic pain: Secondary | ICD-10-CM

## 2022-09-03 DIAGNOSIS — R262 Difficulty in walking, not elsewhere classified: Secondary | ICD-10-CM

## 2022-09-03 DIAGNOSIS — R600 Localized edema: Secondary | ICD-10-CM

## 2022-09-03 DIAGNOSIS — M6281 Muscle weakness (generalized): Secondary | ICD-10-CM

## 2022-09-03 NOTE — Therapy (Signed)
OUTPATIENT PHYSICAL THERAPY TREATMENT NOTE   Patient Name: [name] MRN: [id number] DOB:December 13, 1991, 31 y.o., female Today's Date: 09/03/2022  PCP: Charlott Rakes, MD REFERRING PROVIDER: Argentina Donovan, PA-C  END OF SESSION:   PT End of Session - 09/03/22 1453     Visit Number 5    Number of Visits 17    Date for PT Re-Evaluation 10/24/22    Authorization Type Cign[employer]    Authorization Time Period FOTO v6, v10    PT Start Time 1453   Pt arrived late to appt   PT Stop Time 1530    PT Time Calculation (min) 37 min    Activity Tolerance Patient tolerated treatment well    Behavior During Therapy [facility] for tasks assessed/performed                Past Medical History:  Diagnosis Date   Acid reflux    ADHD    Asthma    Clotting disorder ([place])    Depression    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   ahi 5.5 ,mslt 07/01/06(off meds) mean latency 0 , with 4 sorems npsg 04/24/2010 2.2/hr   Exogenous obesity    Narcolepsy cataplexy syndrome 2007   Prior sleep studies starting on September 26 2005, AHI 5.3 per hour,   Poor sleep hygiene    Past Surgical History:  Procedure Laterality Date   none     Patient Active Problem List   Diagnosis Date Noted   Dental abscess 02/21/2022   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 prior episode ([place]) 10/31/2021   OSA (obstructive sleep apnea) 10/15/2021   Depression, major, recurrent, mild ([place]) 04/30/2021   Generalized anxiety disorder 04/30/2021   Migraine 06/02/2017   Intercostal pain 08/25/2016   Knee pain, bilateral 07/14/2016   Venous stasis dermatitis of both lower extremities 05/26/2016   Stye external 03/25/2016   Plantar fasciitis, right 03/25/2016   Leg swelling 11/17/2015   Headache 09/08/2015   Pain of molar 05/26/2015   Sebaceous cyst of left axilla 04/13/2015   Vitamin D deficiency 03/29/2015   Allergic rhinitis 04/11/2010   Iron deficiency anemia  03/09/2010   Esophageal reflux 03/24/2009   MENORRHAGIA 03/17/2009   Morbid obesity with body mass index of 70 and over in adult [facility]) 05/15/2007   Depression 02/12/2007   Attention deficit hyperactivity disorder (ADHD), predominantly inattentive type 02/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 She states she back is bothering her more today than her knee.   PAIN:  Are you having pain? Yes: NPRS scale: 8/10 low back, 5/10 L knee Pain location: Low back Pain description: achy, sharp Aggravating factors: lifting >5 lbs, standing, walking > 3-4 minutes, sitting > 10-20 minutes Relieving factors: heating pad, muscle relaxers, Meloxicam   OBJECTIVE: (objective measures completed at initial evaluation unless otherwise dated)   DIAGNOSTIC FINDINGS:  08/01/2022: DG Lumbar Spine Complete: IMPRESSION: Minor L5-S1 facet hypertrophy. Otherwise negative radiographs of the lumbar spine.     PATIENT  SURVEYS:  FOTO 35%, predicted 51% in 13 visits   SCREENING FOR RED FLAGS: Bowel or bladder incontinence: No Cauda equina syndrome: No   COGNITION:           Overall cognitive status: Within functional limits for tasks assessed                          SENSATION: Light touch: WFL   MUSCLE LENGTH: Hamstrings: Moderate tightness BIL     POSTURE: rounded shoulders, forward head, increased lumbar lordosis, and flexed trunk    PALPATION: TTP to BIL thoracic and  lumbar paraspinals   PASSIVE ACCESSORIES: Painful CPAs T3-L5   LUMBAR ROM:    Active  AROM  eval  Flexion WNL, pain  Extension WNL, pain  Right lateral flexion WNL, pain  Left lateral flexion WNL  Right rotation WNL  Left rotation WNL   (Blank rows = not tested)   LOWER EXTREMITY ROM:      A/PROM Right eval Left eval  Knee flexion      Knee extension          LOWER EXTREMITY MMT:     MMT Right eval Left eval  Hip flexion 4+/5 4+/5  Hip extension 2/5 2/5  Hip abduction 4/5 4/5  Knee flexion 5/5 5/5  Knee extension 5/5 5/5  Ankle dorsiflexion 5/5 5/5  Ankle plantarflexion 5/5 5/5   (Blank rows = not tested)   LUMBAR SPECIAL TESTS:  Repeated trunk extension x10: (-) Slump: (-) BIL SLR: (+) on Lt   FUNCTIONAL TESTS:  5xSTS: 20 seconds with UE support Squat: 50%, BIL knee pain       GAIT: Distance walked: 20 ft Assistive device utilized: Single point cane Level of assistance: Modified independence Comments: slow gait speed, short stride length       TODAY'S TREATMENT  [facility] Adult PT Treatment:                                                DATE: 09/03/2022 Therapeutic Exercise: Seated marching 2x10 BIL LAQ 2x10 BIL Seated hamstring curl GTB 2x10 BIL Mini squats with UE support 2x10 Standing marching 2x10 BIL Standing hip abduction with UE support 2x10 Seated hamstring stretch x30" BIL Bridges 2x10 Supine marching 2x10 BIL Supine alternating clamshell 2x10 BIL LTR x10 BIL STS x10 arms crossed  [facility]:                                                DATE: 08/30/2022 Aquatic therapy at [facility]- [address] - therapeutic pool temp 92 degrees Pt enters building ambulating with SPC  Treatment took place in water 3.8 to  4 ft 8 in.feet deep depending upon activity.  Pt entered and exited the pool via stair and handrails independently.  Pt pain level 5-6/10 at initiation of water walking.  Therapeutic Exercise: Walking  forward/backwards/side stepping x 4 laps each Runners stretch on bottom step x30" BIL Hamstring stretch on bottom step x30" BIL Standing trunk rotation with yellow noodle x1' At edge of pool, pt performed following exercise: Hip abd/add x20 BIL Hip ext/flex with knee straight x 20 BIL Hip Circles CC/CCW 2x10 each  BIL Marching hip flexion to knee extension 2x10 BIL Hamstring curl x20 BIL Squats 2x20 Wall push ups 2x15  Pt requires the buoyancy of water for active assisted exercises with buoyancy supported for strengthening and AROM exercises. Hydrostatic pressure also supports joints by unweighting joint load by at least 50 % in 3-4 feet depth water. 80% in chest to neck deep water. Water will provide assistance with movement using the current and laminar flow while the buoyancy reduces weight bearing. Pt requires the viscosity of the water for resistance with strengthening exercises.  [facility] Adult PT Treatment:                                                DATE: 08/27/2022 Therapeutic Exercise: Seated marching 2x10 BIL LAQ 3x10 BIL Seated hamstring curl GTB 2x10 BIL Mini squats 2x10 Seated hamstring stretch 2x30" BIL Bridges 2x10 Supine marching 2x10 BIL Supine alternating clamshell 2x10 BIL LTR x10 BIL STS 2x10     PATIENT EDUCATION:  Education details: Pt educated on potential underlying pathophysiology, POC, prognosis, FOTO, and HEP Person educated: Patient Education method: Programmer, multimedia, Demonstration, and Handouts Education comprehension: verbalized understanding and returned demonstration     HOME EXERCISE PROGRAM: Access Code: [email or web address] URL: [email or web address] Date: 08/22/2022 Prepared by: Carmelina Dane   Exercises - Hooklying Sequential Leg March and Lower with handhold resistance  - 1 x daily - 7 x weekly - 2 sets - 10 reps - 5 seconds hold - Sidelying Hip Abduction  - 1 x daily - 7 x weekly - 2 sets - 10 reps - 3 seconds hold - Mini Squat with  Counter Support  - 1 x daily - 7 x weekly - 3 sets - 10 reps - Seated Hamstring Stretch  - 1 x daily - 7 x weekly - 2 sets - 1 minute hold   ASSESSMENT:   CLINICAL IMPRESSION: Patient arrived 8 minutes late to appointment, truncating session. She reports lower L knee pain today but higher back back. Session today focused on improving standing activity tolerance, BIL LE strengthening and lumbar mobility. She remains somewhat limited by fatigue throughout session. Patient continues to benefit from skilled PT services and should be progressed as able to improve functional independence.     OBJECTIVE IMPAIRMENTS Abnormal gait, decreased activity tolerance, decreased balance, decreased endurance, decreased mobility, difficulty walking, decreased ROM, decreased strength, increased edema, impaired flexibility, improper body mechanics, postural dysfunction, obesity, and pain.    ACTIVITY LIMITATIONS carrying, lifting, bending, sitting, standing, squatting, sleeping, stairs, transfers, bed mobility, dressing, hygiene/grooming, and locomotion level   PARTICIPATION LIMITATIONS: meal prep, cleaning, laundry, driving, shopping, community activity, and yard work   PERSONAL FACTORS Fitness and 3+ comorbidities: See medical hx  are also affecting patient's functional outcome.    REHAB POTENTIAL: Fair Due to physical fitness, morbid obesity, and time since onset of pain   CLINICAL DECISION MAKING: Stable/uncomplicated   EVALUATION COMPLEXITY: Low     GOALS: Goals reviewed with patient? Yes   SHORT TERM GOALS: Target date: 09/19/2022   Pt will report understanding and adherence to initial HEP in order to promote independence in the management of primary impairments. Baseline: HEP provided at eval Goal status: INITIAL       LONG TERM GOALS: Target date: 10/17/2022   Pt will achieve a FOTO score of 51% in order to demonstrate improved functional ability  as it relates to her primary  impairments. Baseline: 35% Goal status: INITIAL   2.  Pt will achieve BIL hip extension strength of 4/5 in order to promote endurance with prolonged walking for exercise. Baseline: 2/5 BIL Goal status: INITIAL   3.  Pt will report ability to stand for 10 minutes in order cook with less limitation. Baseline: Pain with any amount of standing Goal status: INITIAL   4.  Pt will report 0-4/10 pain with global lumbar AROM in order to get dressed with less limitation. Baseline: >8/10 pain in trunk flexion, extension, Rt side bend Goal status: INITIAL   5.  Pt will achieve a 5xSTS without use of hands in less than 18 seconds in order to demonstrate safe transfers for community activity. Baseline: 20 seconds with use of hands Goal status: INITIAL       PLAN: PT FREQUENCY: 2x/week   PT DURATION: 8 weeks   PLANNED INTERVENTIONS: Therapeutic exercises, Therapeutic activity, Neuromuscular re-education, Balance training, Gait training, Patient/Family education, Self Care, Joint mobilization, Joint manipulation, Stair training, Prosthetic training, Aquatic Therapy, Dry Needling, Electrical stimulation, Spinal manipulation, Spinal mobilization, Cryotherapy, Moist heat, Taping, Vasopneumatic device, Traction, Biofeedback, Ionotophoresis 4mg /ml Dexamethasone, Manual therapy, and Re-evaluation.   PLAN FOR NEXT SESSION: Progress core/ hip strengthening and mobility, assess knee pain as able    , PTA 09/03/2022, 3:30 PM

## 2022-09-05 NOTE — Therapy (Signed)
OUTPATIENT PHYSICAL THERAPY TREATMENT NOTE   Patient Name: [name] MRN: [id number] DOB:Apr 03, 1991, 31 y.o., female Today's Date: 09/06/2022  PCP: Hoy Register, MD REFERRING PROVIDER: Anders Simmonds, PA-C  END OF SESSION:   PT End of Session - 09/06/22 1604     Visit Number 6    Number of Visits 17    Date for PT Re-Evaluation 10/24/22    Authorization Type Cign[employer]    Authorization Time Period FOTO v6, v10    PT Start Time 1600    PT Stop Time 1645    PT Time Calculation (min) 45 min    Activity Tolerance Patient tolerated treatment well    Behavior During Therapy WFL for tasks assessed/performed                 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   ahi 5.5 ,mslt 07/01/06(off meds) mean latency 0 , with 4 sorems npsg 04/24/2010 2.2/hr   Exogenous obesity    Narcolepsy cataplexy syndrome 2007   Prior sleep studies starting on September 26 2005, AHI 5.3 per hour,   Poor sleep hygiene    Past Surgical History:  Procedure Laterality Date   none     Patient Active Problem List   Diagnosis Date Noted   Dental abscess 02/21/2022   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 prior episode (HCC) 10/31/2021   OSA (obstructive sleep apnea) 10/15/2021   Depression, major, recurrent, mild (HCC) 04/30/2021   Generalized anxiety disorder 04/30/2021   Migraine 06/02/2017   Intercostal pain 08/25/2016   Knee pain, bilateral 07/14/2016   Venous stasis dermatitis of both lower extremities 05/26/2016   Stye external 03/25/2016   Plantar fasciitis, right 03/25/2016   Leg swelling 11/17/2015   Headache 09/08/2015   Pain of molar 05/26/2015   Sebaceous cyst of left axilla 04/13/2015   Vitamin D deficiency 03/29/2015   Allergic rhinitis 04/11/2010   Iron deficiency anemia 03/09/2010   Esophageal  reflux 03/24/2009   MENORRHAGIA 03/17/2009   Morbid obesity with body mass index of 70 and over in adult [facility]) 05/15/2007   Depression 02/12/2007   Attention deficit hyperactivity disorder (ADHD), predominantly inattentive type 02/12/2007   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  PAIN:  Are you having pain? Yes: NPRS scale: 8/10 thoracic/cervical back, 8/10 L knee Pain location: Low back Pain description: achy, sharp Aggravating factors: lifting >5 lbs, standing, walking > 3-4 minutes, sitting > 10-20 minutes Relieving factors: heating pad, muscle relaxers, Meloxicam   OBJECTIVE: (objective measures completed at initial evaluation unless otherwise dated)   DIAGNOSTIC FINDINGS:  08/01/2022: DG Lumbar Spine Complete: IMPRESSION: Minor L5-S1 facet hypertrophy. Otherwise negative radiographs of the lumbar spine.     PATIENT SURVEYS:  FOTO 35%, predicted 51% in 13 visits   SCREENING FOR RED FLAGS: Bowel or bladder incontinence: No  Cauda equina syndrome: No   COGNITION:           Overall cognitive status: Within functional limits for tasks assessed                          SENSATION: Light touch: WFL   MUSCLE LENGTH: Hamstrings: Moderate tightness BIL     POSTURE: rounded shoulders, forward head, increased lumbar lordosis, and flexed trunk    PALPATION: TTP to BIL thoracic and lumbar paraspinals   PASSIVE ACCESSORIES: Painful CPAs T3-L5   LUMBAR ROM:     Active  AROM  eval  Flexion WNL, pain  Extension WNL, pain  Right lateral flexion WNL, pain  Left lateral flexion WNL  Right rotation WNL  Left rotation WNL   (Blank rows = not tested)   LOWER EXTREMITY ROM:      A/PROM Right eval Left eval  Knee flexion      Knee extension          LOWER EXTREMITY MMT:     MMT Right eval Left eval  Hip flexion 4+/5 4+/5  Hip extension 2/5 2/5  Hip abduction 4/5 4/5  Knee flexion 5/5 5/5  Knee extension 5/5 5/5  Ankle dorsiflexion 5/5 5/5  Ankle plantarflexion 5/5 5/5   (Blank rows = not tested)   LUMBAR SPECIAL TESTS:  Repeated trunk extension x10: (-) Slump: (-) BIL SLR: (+) on Lt   FUNCTIONAL TESTS:  5xSTS: 20 seconds with UE support Squat: 50%, BIL knee pain       GAIT: Distance walked: 20 ft Assistive device utilized: Single point cane Level of assistance: Modified independence Comments: slow gait speed, short stride length       TODAY'S TREATMENT  [facility] Adult PT Treatment:                                                DATE: 09/06/2022 Aquatic therapy at [facility]- [address] - therapeutic pool temp 92 degrees Pt enters building ambulating with SPC. Treatment took place in water 3.8 to  4 ft 8 in.feet deep depending upon activity.  Pt entered and exited the pool via stair and handrails independently.  Pt pain level 8/10 at initiation of water walking.  Therapeutic Exercise: Walking forward/backwards/side stepping x2 laps each Warrior I pool noodle lift x10 BIL Standing trunk rotation with yellow noodle x1' STS from third step from bottom 2x10 Side stepping lunge walk x2 laps Forward march walk x2 laps At edge of pool, pt performed following exercise: Hip abd/add x20 BIL Hip ext/flex with knee straight x 20 BIL Hip Circles CC/CCW x10 each BIL Marching hip flexion to knee extension 2x10 BIL Hamstring curl x20 BIL Squats 2x20 Wall push ups 2x20  Pt requires the buoyancy of water for active assisted  exercises with buoyancy supported for strengthening and AROM exercises. Hydrostatic pressure also supports joints by unweighting joint load by at least 50 % in 3-4 feet depth water. 80% in chest to neck deep water. Water will provide assistance with movement using the current and laminar flow while the buoyancy reduces weight bearing. Pt requires the viscosity of the water for resistance with strengthening exercises.  [facility] Adult PT Treatment:  DATE: 09/03/2022 Therapeutic Exercise: Seated marching 2x10 BIL LAQ 2x10 BIL Seated hamstring curl GTB 2x10 BIL Mini squats with UE support 2x10 Standing marching 2x10 BIL Standing hip abduction with UE support 2x10 Seated hamstring stretch x30" BIL Bridges 2x10 Supine marching 2x10 BIL Supine alternating clamshell 2x10 BIL LTR x10 BIL STS x10 arms crossed  [facility] Adult PT Treatment:                                                DATE: 08/30/2022 Aquatic therapy at [facility]- [address] - therapeutic pool temp 92 degrees Pt enters building ambulating with SPC  Treatment took place in water 3.8 to  4 ft 8 in.feet deep depending upon activity.  Pt entered and exited the pool via stair and handrails independently.  Pt pain level 5-6/10 at initiation of water walking.  Therapeutic Exercise: Walking forward/backwards/side stepping x 4 laps each Runners stretch on bottom step x30" BIL Hamstring stretch on bottom step x30" BIL Standing trunk rotation with yellow noodle x1' At edge of pool, pt performed following exercise: Hip abd/add x20 BIL Hip ext/flex with knee straight x 20 BIL Hip Circles CC/CCW 2x10 each BIL Marching hip flexion to knee extension 2x10 BIL Hamstring curl x20 BIL Squats 2x20 Wall push ups 2x15  Pt requires the buoyancy of water for active assisted exercises with buoyancy supported for strengthening and AROM exercises. Hydrostatic pressure also supports joints by unweighting  joint load by at least 50 % in 3-4 feet depth water. 80% in chest to neck deep water. Water will provide assistance with movement using the current and laminar flow while the buoyancy reduces weight bearing. Pt requires the viscosity of the water for resistance with strengthening exercises.    PATIENT EDUCATION:  Education details: Pt educated on potential underlying pathophysiology, POC, prognosis, FOTO, and HEP Person educated: Patient Education method: Explanation, Demonstration, and Handouts Education comprehension: verbalized understanding and returned demonstration     HOME EXERCISE PROGRAM: Access Code: [email or web address] URL: [email or web address] Date: 08/22/2022 Prepared by: Carmelina Daneucker [place]   Exercises - Hooklying Sequential Leg March and Lower with handhold resistance  - 1 x daily - 7 x weekly - 2 sets - 10 reps - 5 seconds hold - Sidelying Hip Abduction  - 1 x daily - 7 x weekly - 2 sets - 10 reps - 3 seconds hold - Mini Squat with Counter Support  - 1 x daily - 7 x weekly - 3 sets - 10 reps - Seated Hamstring Stretch  - 1 x daily - 7 x weekly - 2 sets - 1 minute hold   ASSESSMENT:   CLINICAL IMPRESSION: Patient presents to aquatic PT session reporting 8/10 pain in her thoracic and cervical spine as well as the L knee and reports mild pain in her R knee. Session today focused on BIL LE strengthening and general conditioning in the aquatic environment for use of buoyancy to offload joints and the viscosity of water as resistance during therapeutic exercise. Patient was able to tolerate all prescribed exercises in the aquatic environment with no adverse effects. She was unable to quantify her pain levels at end of session, just stating that everything felt "heavy."  Patient continues to benefit from skilled PT services on land and aquatic based and should be progressed as able to improve functional independence.    OBJECTIVE IMPAIRMENTS Abnormal  gait, decreased activity  tolerance, decreased balance, decreased endurance, decreased mobility, difficulty walking, decreased ROM, decreased strength, increased edema, impaired flexibility, improper body mechanics, postural dysfunction, obesity, and pain.    ACTIVITY LIMITATIONS carrying, lifting, bending, sitting, standing, squatting, sleeping, stairs, transfers, bed mobility, dressing, hygiene/grooming, and locomotion level   PARTICIPATION LIMITATIONS: meal prep, cleaning, laundry, driving, shopping, community activity, and yard work   [place] and 3+ comorbidities: See medical hx  are also affecting patient's functional outcome.    REHAB POTENTIAL: Fair Due to physical fitness, morbid obesity, and time since onset of pain   CLINICAL DECISION MAKING: Stable/uncomplicated   EVALUATION COMPLEXITY: Low     GOALS: Goals reviewed with patient? Yes   SHORT TERM GOALS: Target date: 09/19/2022   Pt will report understanding and adherence to initial HEP in order to promote independence in the management of primary impairments. Baseline: HEP provided at eval Goal status: INITIAL       LONG TERM GOALS: Target date: 10/17/2022   Pt will achieve a FOTO score of 51% in order to demonstrate improved functional ability as it relates to her primary impairments. Baseline: 35% Goal status: INITIAL   2.  Pt will achieve BIL hip extension strength of 4/5 in order to promote endurance with prolonged walking for exercise. Baseline: 2/5 BIL Goal status: INITIAL   3.  Pt will report ability to stand for 10 minutes in order cook with less limitation. Baseline: Pain with any amount of standing Goal status: INITIAL   4.  Pt will report 0-4/10 pain with global lumbar AROM in order to get dressed with less limitation. Baseline: >8/10 pain in trunk flexion, extension, Rt side bend Goal status: INITIAL   5.  Pt will achieve a 5xSTS without use of hands in less than 18 seconds in order to demonstrate safe  transfers for community activity. Baseline: 20 seconds with use of hands Goal status: INITIAL       PLAN: PT FREQUENCY: 2x/week   PT DURATION: 8 weeks   PLANNED INTERVENTIONS: Therapeutic exercises, Therapeutic activity, Neuromuscular re-education, Balance training, Gait training, Patient/Family education, Self Care, Joint mobilization, Joint manipulation, Stair training, Prosthetic training, Aquatic Therapy, Dry Needling, Electrical stimulation, Spinal manipulation, Spinal mobilization, Cryotherapy, Moist heat, Taping, Vasopneumatic device, Traction, Biofeedback, Ionotophoresis 4mg /ml Dexamethasone, Manual therapy, and Re-evaluation.   PLAN FOR NEXT SESSION: Progress core/ hip strengthening and mobility, assess knee pain as able    Margarette [place], PTA 09/06/2022, 4:48 PM

## 2022-09-06 ENCOUNTER — Ambulatory Visit: Payer: Commercial Managed Care - HMO

## 2022-09-06 DIAGNOSIS — R262 Difficulty in walking, not elsewhere classified: Secondary | ICD-10-CM

## 2022-09-06 DIAGNOSIS — M6281 Muscle weakness (generalized): Secondary | ICD-10-CM

## 2022-09-06 DIAGNOSIS — R600 Localized edema: Secondary | ICD-10-CM

## 2022-09-06 DIAGNOSIS — M5459 Other low back pain: Secondary | ICD-10-CM | POA: Diagnosis not present

## 2022-09-06 DIAGNOSIS — G8929 Other chronic pain: Secondary | ICD-10-CM

## 2022-09-11 ENCOUNTER — Ambulatory Visit: Payer: Commercial Managed Care - HMO

## 2022-09-11 DIAGNOSIS — M5459 Other low back pain: Secondary | ICD-10-CM | POA: Diagnosis not present

## 2022-09-11 DIAGNOSIS — R600 Localized edema: Secondary | ICD-10-CM

## 2022-09-11 DIAGNOSIS — G8929 Other chronic pain: Secondary | ICD-10-CM

## 2022-09-11 DIAGNOSIS — M6281 Muscle weakness (generalized): Secondary | ICD-10-CM

## 2022-09-11 DIAGNOSIS — R262 Difficulty in walking, not elsewhere classified: Secondary | ICD-10-CM

## 2022-09-11 NOTE — Therapy (Signed)
OUTPATIENT PHYSICAL THERAPY TREATMENT NOTE   Patient Name: [name] MRN: [id number] DOB:May 09, 1991, 31 y.o., female Today's Date: 09/11/2022  PCP: Hoy Register, MD REFERRING PROVIDER: Anders Simmonds, PA-C  END OF SESSION:   PT End of Session - 09/11/22 1615     Visit Number 7    Number of Visits 17    Date for PT Re-Evaluation 10/24/22    Authorization Type Cigna    Authorization Time Period FOTO v6, v10    PT Start Time 1615    PT Stop Time 1655    PT Time Calculation (min) 40 min    Activity Tolerance Patient tolerated treatment well    Behavior During Therapy WFL for tasks assessed/performed               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   ahi 5.5 ,mslt 07/01/06(off meds) mean latency 0 , with 4 sorems npsg 04/24/2010 2.2/hr   Exogenous obesity    Narcolepsy cataplexy syndrome 2007   Prior sleep studies starting on September 26 2005, AHI 5.3 per hour,   Poor sleep hygiene    Past Surgical History:  Procedure Laterality Date   none     Patient Active Problem List   Diagnosis Date Noted   Dental abscess 02/21/2022   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 prior episode (HCC) 10/31/2021   OSA (obstructive sleep apnea) 10/15/2021   Depression, major, recurrent, mild (HCC) 04/30/2021   Generalized anxiety disorder 04/30/2021   Migraine 06/02/2017   Intercostal pain 08/25/2016   Knee pain, bilateral 07/14/2016   Venous stasis dermatitis of both lower extremities 05/26/2016   Stye external 03/25/2016   Plantar fasciitis, right 03/25/2016   Leg swelling 11/17/2015   Headache 09/08/2015   Pain of molar 05/26/2015   Sebaceous cyst of left axilla 04/13/2015   Vitamin D deficiency 03/29/2015   Allergic rhinitis 04/11/2010   Iron deficiency anemia 03/09/2010   Esophageal reflux  03/24/2009   MENORRHAGIA 03/17/2009   Morbid obesity with body mass index of 70 and over in adult [facility]) 05/15/2007   Depression 02/12/2007   Attention deficit hyperactivity disorder (ADHD), predominantly inattentive type 02/12/2007   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  PAIN:  Are you having pain? Yes: NPRS scale: 7-8/10 thoracic/cervical back, 9/10 L knee Pain location: Low back Pain description: achy, sharp Aggravating factors: lifting >5 lbs, standing, walking > 3-4 minutes, sitting > 10-20 minutes Relieving factors: heating pad, muscle relaxers, Meloxicam   OBJECTIVE: (objective measures completed at initial evaluation unless otherwise dated)   DIAGNOSTIC FINDINGS:  08/01/2022: DG Lumbar Spine Complete: IMPRESSION: Minor L5-S1 facet hypertrophy. Otherwise negative radiographs of the lumbar spine.     PATIENT SURVEYS:  FOTO 35%, predicted 51% in 13 visits   SCREENING FOR RED FLAGS: Bowel or bladder incontinence: No Cauda  equina syndrome: No   COGNITION:           Overall cognitive status: Within functional limits for tasks assessed                          SENSATION: Light touch: WFL   MUSCLE LENGTH: Hamstrings: Moderate tightness BIL     POSTURE: rounded shoulders, forward head, increased lumbar lordosis, and flexed trunk    PALPATION: TTP to BIL thoracic and lumbar paraspinals   PASSIVE ACCESSORIES: Painful CPAs T3-L5   LUMBAR ROM:     Active  AROM  eval  Flexion WNL, pain  Extension WNL, pain  Right lateral flexion WNL, pain  Left lateral flexion WNL  Right rotation WNL  Left rotation WNL   (Blank rows = not tested)   LOWER EXTREMITY ROM:      A/PROM Right eval Left eval  Knee flexion      Knee extension          LOWER EXTREMITY MMT:     MMT Right eval Left eval  Hip flexion 4+/5 4+/5  Hip extension 2/5 2/5  Hip abduction 4/5 4/5  Knee flexion 5/5 5/5  Knee extension 5/5 5/5  Ankle dorsiflexion 5/5 5/5  Ankle plantarflexion 5/5 5/5   (Blank rows = not tested)   LUMBAR SPECIAL TESTS:  Repeated trunk extension x10: (-) Slump: (-) BIL SLR: (+) on Lt   FUNCTIONAL TESTS:  5xSTS: 20 seconds with UE support Squat: 50%, BIL knee pain       GAIT: Distance walked: 20 ft Assistive device utilized: Single point cane Level of assistance: Modified independence Comments: slow gait speed, short stride length       TODAY'S TREATMENT  [facility] Adult PT Treatment:                                                DATE: 09/11/2022 Therapeutic Exercise: Seated marching 3# 2x1' LAQ 2x10 BIL 3# with 3" hold  Standing marching 2x10 BIL 3# Standing hip abduction with UE support 2x10 3# STS 2x10 with 10# KB Ambulation x2 laps in clinic for endurance Standing rows with 2 7# cables Standing shoulder extension with 2 3# cables Sled push 20# 2 laps 2x32ft   [facility] Adult PT Treatment:                                                DATE: 09/06/2022 Aquatic therapy at [facility]- [address] - therapeutic pool temp 92 degrees Pt enters building ambulating with SPC. Treatment took place in water 3.8 to  4 ft 8 in.feet deep depending upon activity.  Pt entered and exited the pool via stair and handrails independently.  Pt pain level 8/10 at initiation of water walking.  Therapeutic Exercise: Walking forward/backwards/side stepping x2 laps each Warrior I pool noodle lift x10 BIL Standing trunk rotation with  yellow noodle x1' STS from third step from bottom 2x10 Side stepping lunge walk x2 laps Forward march walk x2 laps At edge of pool, pt performed following exercise: Hip abd/add x20 BIL Hip ext/flex with knee straight x 20 BIL Hip Circles CC/CCW x10 each BIL Marching hip flexion to knee extension 2x10  BIL Hamstring curl x20 BIL Squats 2x20 Wall push ups 2x20  Pt requires the buoyancy of water for active assisted exercises with buoyancy supported for strengthening and AROM exercises. Hydrostatic pressure also supports joints by unweighting joint load by at least 50 % in 3-4 feet depth water. 80% in chest to neck deep water. Water will provide assistance with movement using the current and laminar flow while the buoyancy reduces weight bearing. Pt requires the viscosity of the water for resistance with strengthening exercises.  [facility] Adult PT Treatment:                                                DATE: 09/03/2022 Therapeutic Exercise: Seated marching 2x10 BIL LAQ 2x10 BIL Seated hamstring curl GTB 2x10 BIL Mini squats with UE support 2x10 Standing marching 2x10 BIL Standing hip abduction with UE support 2x10 Seated hamstring stretch x30" BIL Bridges 2x10 Supine marching 2x10 BIL Supine alternating clamshell 2x10 BIL LTR x10 BIL STS x10 arms crossed    PATIENT EDUCATION:  Education details: Pt educated on potential underlying pathophysiology, POC, prognosis, FOTO, and HEP Person educated: Patient Education method: Consulting civil engineer, Demonstration, and Handouts Education comprehension: verbalized understanding and returned demonstration     HOME EXERCISE PROGRAM: Access Code: [email or web address] URL: [email or web address] Date: 08/22/2022 Prepared by: Vanessa [place]   Exercises - Hooklying Sequential Leg March and Lower with handhold resistance  - 1 x daily - 7 x weekly - 2 sets - 10 reps - 5 seconds hold - Sidelying Hip Abduction  - 1 x daily - 7 x weekly - 2 sets - 10 reps  - 3 seconds hold - Mini Squat with Counter Support  - 1 x daily - 7 x weekly - 3 sets - 10 reps - Seated Hamstring Stretch  - 1 x daily - 7 x weekly - 2 sets - 1 minute hold   ASSESSMENT:   CLINICAL IMPRESSION: Patient presents to PT with high levels of L knee pain as well as thoracic and lower back pain. Session today focused on BIL LE strengthening and improving standing activity tolerance. Introduced more advanced exercises this session and increased resistance of familiar exercises with good effect. Patient continues to benefit from skilled PT services and should be progressed as able to improve functional independence.     OBJECTIVE IMPAIRMENTS Abnormal gait, decreased activity tolerance, decreased balance, decreased endurance, decreased mobility, difficulty walking, decreased ROM, decreased strength, increased edema, impaired flexibility, improper body mechanics, postural dysfunction, obesity, and pain.    ACTIVITY LIMITATIONS carrying, lifting, bending, sitting, standing, squatting, sleeping, stairs, transfers, bed mobility, dressing, hygiene/grooming, and locomotion level   PARTICIPATION LIMITATIONS: meal prep, cleaning, laundry, driving, shopping, community activity, and yard work   [place] and 3+ comorbidities: See medical hx  are also affecting patient's functional outcome.    REHAB POTENTIAL: Fair Due to physical fitness, morbid obesity, and time since onset of pain   CLINICAL DECISION MAKING: Stable/uncomplicated   EVALUATION COMPLEXITY: Low     GOALS: Goals reviewed with patient? Yes   SHORT TERM GOALS: Target date: 09/19/2022   Pt will report understanding and adherence to initial HEP in order to promote independence in the management of primary impairments. Baseline: HEP provided at eval Goal status: INITIAL       LONG TERM GOALS: Target date: 10/17/2022   Pt will achieve a  FOTO score of 51% in order to demonstrate improved functional ability as it  relates to her primary impairments. Baseline: 35% Goal status: INITIAL   2.  Pt will achieve BIL hip extension strength of 4/5 in order to promote endurance with prolonged walking for exercise. Baseline: 2/5 BIL Goal status: INITIAL   3.  Pt will report ability to stand for 10 minutes in order cook with less limitation. Baseline: Pain with any amount of standing Goal status: INITIAL   4.  Pt will report 0-4/10 pain with global lumbar AROM in order to get dressed with less limitation. Baseline: >8/10 pain in trunk flexion, extension, Rt side bend Goal status: INITIAL   5.  Pt will achieve a 5xSTS without use of hands in less than 18 seconds in order to demonstrate safe transfers for community activity. Baseline: 20 seconds with use of hands Goal status: INITIAL       PLAN: PT FREQUENCY: 2x/week   PT DURATION: 8 weeks   PLANNED INTERVENTIONS: Therapeutic exercises, Therapeutic activity, Neuromuscular re-education, Balance training, Gait training, Patient/Family education, Self Care, Joint mobilization, Joint manipulation, Stair training, Prosthetic training, Aquatic Therapy, Dry Needling, Electrical stimulation, Spinal manipulation, Spinal mobilization, Cryotherapy, Moist heat, Taping, Vasopneumatic device, Traction, Biofeedback, Ionotophoresis 4mg /ml Dexamethasone, Manual therapy, and Re-evaluation.   PLAN FOR NEXT SESSION: Progress core/ hip strengthening and mobility, assess knee pain as able    , PTA 09/11/2022, 4:58 PM

## 2022-09-12 NOTE — Therapy (Signed)
OUTPATIENT PHYSICAL THERAPY TREATMENT NOTE   Patient Name: ZEPHANIAH ENYEART MRN: [id number] DOB:1991-03-23, 31 y.o., female Today's Date: 09/13/2022  PCP: Hoy Register, MD REFERRING PROVIDER: Anders Simmonds, PA-C  END OF SESSION:   PT End of Session - 09/13/22 1602     Visit Number 8    Number of Visits 17    Date for PT Re-Evaluation 10/24/22    Authorization Type Cign[employer]    Authorization Time Period FOTO v6, v10    PT Start Time 1600    PT Stop Time 1645    PT Time Calculation (min) 45 min    Activity Tolerance Patient tolerated treatment well    Behavior During Therapy WFL for tasks assessed/performed               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   ahi 5.5 ,mslt 07/01/06(off meds) mean latency 0 , with 4 sorems npsg 04/24/2010 2.2/hr   Exogenous obesity    Narcolepsy cataplexy syndrome 2007   Prior sleep studies starting on September 26 2005, AHI 5.3 per hour,   Poor sleep hygiene    Past Surgical History:  Procedure Laterality Date   none     Patient Active Problem List   Diagnosis Date Noted   Dental abscess 02/21/2022   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 prior episode (HCC) 10/31/2021   OSA (obstructive sleep apnea) 10/15/2021   Depression, major, recurrent, mild (HCC) 04/30/2021   Generalized anxiety disorder 04/30/2021   Migraine 06/02/2017   Intercostal pain 08/25/2016   Knee pain, bilateral 07/14/2016   Venous stasis dermatitis of both lower extremities 05/26/2016   Stye external 03/25/2016   Plantar fasciitis, right 03/25/2016   Leg swelling 11/17/2015   Headache 09/08/2015   Pain of molar 05/26/2015   Sebaceous cyst of left axilla 04/13/2015   Vitamin D deficiency 03/29/2015   Allergic rhinitis 04/11/2010   Iron deficiency anemia 03/09/2010   Esophageal reflux  03/24/2009   MENORRHAGIA 03/17/2009   Morbid obesity with body mass index of 70 and over in adult [facility]) 05/15/2007   Depression 02/12/2007   Attention deficit hyperactivity disorder (ADHD), predominantly inattentive type 02/12/2007   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  PAIN:  Are you having pain? Yes: NPRS scale: 6/10 thoracic/cervical back, 6/10 L knee Pain location: Low back Pain description: achy, sharp Aggravating factors: lifting >5 lbs, standing, walking > 3-4 minutes, sitting > 10-20 minutes Relieving factors: heating pad, muscle relaxers, Meloxicam   OBJECTIVE: (objective measures completed at initial evaluation unless otherwise dated)   DIAGNOSTIC FINDINGS:  08/01/2022: DG Lumbar Spine Complete: IMPRESSION: Minor L5-S1 facet hypertrophy. Otherwise negative radiographs of the lumbar spine.     PATIENT SURVEYS:  FOTO 35%, predicted 51% in 13 visits   SCREENING FOR RED FLAGS: Bowel or bladder  incontinence: No Cauda equina syndrome: No   COGNITION:           Overall cognitive status: Within functional limits for tasks assessed                          SENSATION: Light touch: WFL   MUSCLE LENGTH: Hamstrings: Moderate tightness BIL     POSTURE: rounded shoulders, forward head, increased lumbar lordosis, and flexed trunk    PALPATION: TTP to BIL thoracic and lumbar paraspinals   PASSIVE ACCESSORIES: Painful CPAs  T3-L5   LUMBAR ROM:    Active  AROM  eval  Flexion WNL, pain  Extension WNL, pain  Right lateral flexion WNL, pain  Left lateral flexion WNL  Right rotation WNL  Left rotation WNL   (Blank rows = not tested)   LOWER EXTREMITY ROM:      A/PROM Right eval Left eval  Knee flexion      Knee extension          LOWER EXTREMITY MMT:     MMT Right eval Left eval  Hip flexion 4+/5 4+/5  Hip extension 2/5 2/5  Hip abduction 4/5 4/5  Knee flexion 5/5 5/5  Knee extension 5/5 5/5  Ankle dorsiflexion 5/5 5/5  Ankle plantarflexion 5/5 5/5   (Blank rows = not tested)   LUMBAR SPECIAL TESTS:  Repeated trunk extension x10: (-) Slump: (-) BIL SLR: (+) on Lt   FUNCTIONAL TESTS:  5xSTS: 20 seconds with UE support Squat: 50%, BIL knee pain       GAIT: Distance walked: 20 ft Assistive device utilized: Single point cane Level of assistance: Modified independence Comments: slow gait speed, short stride length       TODAY'S TREATMENT  [facility] Adult PT Treatment:                                                DATE: 09/13/2022 Aquatic therapy at [facility]- [address] - therapeutic pool temp 92 degrees Pt enters building ambulating with SPC. Treatment took place in water 3.8 to  4 ft 8 in.feet deep depending upon activity.  Pt entered and exited the pool via stair and handrails independently.  Pt pain level 6/10 at initiation of water walking.  Therapeutic Exercise: Walking forward/backwards/side stepping x2 laps each with yellow DB by sides Warrior I pool noodle lift x10 BIL Step ups on submerged step forward/lateral x10 each BIL Step up and overs on submerged step x10 each BIL Side stepping with yellow DB shoulder ab/adduction x2 laps Forward lunge walk with yellow DB by sides x2 laps Standing trunk rotation with yellow noodle x1' Runners stretch x30" BIL Hamstring stretch x30" BIL At edge of pool, pt performed following exercise: Squats 2x20 Wall push ups  2x20  Back against wall using yellow DB: Shoulder flexion/extension x20 Shoulder ab/adduction x20 Horizontal ab/adduction x20  Pt requires the buoyancy of water for active assisted exercises with buoyancy supported for strengthening and AROM exercises. Hydrostatic pressure also supports joints by unweighting joint load by at least 50 % in 3-4 feet depth water. 80% in chest to neck deep water. Water will provide assistance with movement using the current and laminar flow while the buoyancy reduces weight bearing. Pt requires the viscosity of the water for resistance with strengthening exercises.  [facility] Adult PT Treatment:  DATE: 09/11/2022 Therapeutic Exercise: Seated marching 3# 2x1' LAQ 2x10 BIL 3# with 3" hold  Standing marching 2x10 BIL 3# Standing hip abduction with UE support 2x10 3# STS 2x10 with 10# KB Ambulation x2 laps in clinic for endurance Standing rows with 2 7# cables Standing shoulder extension with 2 3# cables Sled push 20# 2 laps 2x51ft   [facility] Adult PT Treatment:                                                DATE: 09/06/2022 Aquatic therapy at [facility]- [address] - therapeutic pool temp 92 degrees Pt enters building ambulating with SPC. Treatment took place in water 3.8 to  4 ft 8 in.feet deep depending upon activity.  Pt entered and exited the pool via stair and handrails independently.  Pt pain level 8/10 at initiation of water walking.  Therapeutic Exercise: Walking forward/backwards/side stepping x2 laps each Warrior I pool noodle lift x10 BIL Standing trunk rotation with yellow noodle x1' STS from third step from bottom 2x10 Side stepping lunge walk x2 laps Forward march walk x2 laps At edge of pool, pt performed following exercise: Hip abd/add x20 BIL Hip ext/flex with knee straight x 20 BIL Hip Circles CC/CCW x10 each BIL Marching hip flexion to knee extension 2x10 BIL Hamstring curl x20 BIL Squats  2x20 Wall push ups 2x20  Pt requires the buoyancy of water for active assisted exercises with buoyancy supported for strengthening and AROM exercises. Hydrostatic pressure also supports joints by unweighting joint load by at least 50 % in 3-4 feet depth water. 80% in chest to neck deep water. Water will provide assistance with movement using the current and laminar flow while the buoyancy reduces weight bearing. Pt requires the viscosity of the water for resistance with strengthening exercises.    PATIENT EDUCATION:  Education details: Pt educated on potential underlying pathophysiology, POC, prognosis, FOTO, and HEP Person educated: Patient Education method: Explanation, Demonstration, and Handouts Education comprehension: verbalized understanding and returned demonstration     HOME EXERCISE PROGRAM: Access Code: [email or web address] URL: [email or web address] Date: 08/22/2022 Prepared by: Carmelina Dane   Exercises - Hooklying Sequential Leg March and Lower with handhold resistance  - 1 x daily - 7 x weekly - 2 sets - 10 reps - 5 seconds hold - Sidelying Hip Abduction  - 1 x daily - 7 x weekly - 2 sets - 10 reps - 3 seconds hold - Mini Squat with Counter Support  - 1 x daily - 7 x weekly - 3 sets - 10 reps - Seated Hamstring Stretch  - 1 x daily - 7 x weekly - 2 sets - 1 minute hold   ASSESSMENT:   CLINICAL IMPRESSION: Patient presents to aquatic PT session with 6/10 knee and back pain at beginning of session. Session today focused on BIL LE strengthening and general conditioning in the aquatic environment for use of buoyancy to offload joints and the viscosity of water as resistance during therapeutic exercise. Patient was able to tolerate continuous exercise in the aquatic environment with no adverse effects. Patient continues to benefit from skilled PT services on land and aquatic based and should be progressed as able to improve functional independence.    OBJECTIVE  IMPAIRMENTS Abnormal gait, decreased activity tolerance, decreased balance, decreased endurance, decreased mobility, difficulty walking, decreased ROM, decreased strength, increased edema, impaired  flexibility, improper body mechanics, postural dysfunction, obesity, and pain.    ACTIVITY LIMITATIONS carrying, lifting, bending, sitting, standing, squatting, sleeping, stairs, transfers, bed mobility, dressing, hygiene/grooming, and locomotion level   PARTICIPATION LIMITATIONS: meal prep, cleaning, laundry, driving, shopping, community activity, and yard work   [place] and 3+ comorbidities: See medical hx  are also affecting patient's functional outcome.    REHAB POTENTIAL: Fair Due to physical fitness, morbid obesity, and time since onset of pain   CLINICAL DECISION MAKING: Stable/uncomplicated   EVALUATION COMPLEXITY: Low     GOALS: Goals reviewed with patient? Yes   SHORT TERM GOALS: Target date: 09/19/2022   Pt will report understanding and adherence to initial HEP in order to promote independence in the management of primary impairments. Baseline: HEP provided at eval Goal status: INITIAL       LONG TERM GOALS: Target date: 10/17/2022   Pt will achieve a FOTO score of 51% in order to demonstrate improved functional ability as it relates to her primary impairments. Baseline: 35% Goal status: INITIAL   2.  Pt will achieve BIL hip extension strength of 4/5 in order to promote endurance with prolonged walking for exercise. Baseline: 2/5 BIL Goal status: INITIAL   3.  Pt will report ability to stand for 10 minutes in order cook with less limitation. Baseline: Pain with any amount of standing Goal status: INITIAL   4.  Pt will report 0-4/10 pain with global lumbar AROM in order to get dressed with less limitation. Baseline: >8/10 pain in trunk flexion, extension, Rt side bend Goal status: INITIAL   5.  Pt will achieve a 5xSTS without use of hands in less than 18  seconds in order to demonstrate safe transfers for community activity. Baseline: 20 seconds with use of hands Goal status: INITIAL       PLAN: PT FREQUENCY: 2x/week   PT DURATION: 8 weeks   PLANNED INTERVENTIONS: Therapeutic exercises, Therapeutic activity, Neuromuscular re-education, Balance training, Gait training, Patient/Family education, Self Care, Joint mobilization, Joint manipulation, Stair training, Prosthetic training, Aquatic Therapy, Dry Needling, Electrical stimulation, Spinal manipulation, Spinal mobilization, Cryotherapy, Moist heat, Taping, Vasopneumatic device, Traction, Biofeedback, Ionotophoresis 4mg /ml Dexamethasone, Manual therapy, and Re-evaluation.   PLAN FOR NEXT SESSION: Progress core/ hip strengthening and mobility, assess knee pain as able    Margarette Canada, PTA 09/13/2022, 4:47 PM

## 2022-09-13 ENCOUNTER — Ambulatory Visit: Payer: Commercial Managed Care - HMO

## 2022-09-13 DIAGNOSIS — R262 Difficulty in walking, not elsewhere classified: Secondary | ICD-10-CM

## 2022-09-13 DIAGNOSIS — G8929 Other chronic pain: Secondary | ICD-10-CM

## 2022-09-13 DIAGNOSIS — M6281 Muscle weakness (generalized): Secondary | ICD-10-CM

## 2022-09-13 DIAGNOSIS — R600 Localized edema: Secondary | ICD-10-CM

## 2022-09-13 DIAGNOSIS — M5459 Other low back pain: Secondary | ICD-10-CM | POA: Diagnosis not present

## 2022-09-17 ENCOUNTER — Ambulatory Visit: Payer: Commercial Managed Care - HMO | Attending: Family Medicine

## 2022-09-17 DIAGNOSIS — G8929 Other chronic pain: Secondary | ICD-10-CM | POA: Insufficient documentation

## 2022-09-17 DIAGNOSIS — R600 Localized edema: Secondary | ICD-10-CM | POA: Insufficient documentation

## 2022-09-17 DIAGNOSIS — M5459 Other low back pain: Secondary | ICD-10-CM | POA: Insufficient documentation

## 2022-09-17 DIAGNOSIS — M6281 Muscle weakness (generalized): Secondary | ICD-10-CM | POA: Insufficient documentation

## 2022-09-17 DIAGNOSIS — M25562 Pain in left knee: Secondary | ICD-10-CM | POA: Insufficient documentation

## 2022-09-17 DIAGNOSIS — R262 Difficulty in walking, not elsewhere classified: Secondary | ICD-10-CM | POA: Insufficient documentation

## 2022-09-17 NOTE — Therapy (Signed)
OUTPATIENT PHYSICAL THERAPY TREATMENT NOTE   Patient Name: [name] MRN: [id number] DOB:Nov 15, 1991, 31 y.o., female Today's Date: 09/17/2022  PCP: Charlott Rakes, MD REFERRING PROVIDER: Argentina Donovan, PA-C  END OF SESSION:   PT End of Session - 09/17/22 1620     Visit Number 9    Number of Visits 17    Date for PT Re-Evaluation 10/24/22    Authorization Type C[employer]    Authorization Time Period FOTO v6, v10    PT Start Time 1619    PT Stop Time 1700    PT Time Calculation (min) 41 min    Activity Tolerance Patient tolerated treatment well    Behavior During Therapy WFL for tasks assessed/performed                Past Medical History:  Diagnosis Date   Acid reflux    ADHD    Asthma    Clotting disorder ([place])    Depression    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   ahi 5.5 ,mslt 07/01/06(off meds) mean latency 0 , with 4 sorems npsg 04/24/2010 2.2/hr   Exogenous obesity    Narcolepsy cataplexy syndrome 2007   Prior sleep studies starting on September 26 2005, AHI 5.3 per hour,   Poor sleep hygiene    Past Surgical History:  Procedure Laterality Date   none     Patient Active Problem List   Diagnosis Date Noted   Dental abscess 02/21/2022   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 prior episode ([place]) 10/31/2021   OSA (obstructive sleep apnea) 10/15/2021   Depression, major, recurrent, mild ([place]) 04/30/2021   Generalized anxiety disorder 04/30/2021   Migraine 06/02/2017   Intercostal pain 08/25/2016   Knee pain, bilateral 07/14/2016   Venous stasis dermatitis of both lower extremities 05/26/2016   Stye external 03/25/2016   Plantar fasciitis, right 03/25/2016   Leg swelling 11/17/2015   Headache 09/08/2015   Pain of molar 05/26/2015   Sebaceous cyst of left axilla 04/13/2015   Vitamin D deficiency 03/29/2015   Allergic rhinitis 04/11/2010   Iron deficiency anemia 03/09/2010   Esophageal  reflux 03/24/2009   MENORRHAGIA 03/17/2009   Morbid obesity with body mass index of 70 and over in adult [facility]) 05/15/2007   Depression 02/12/2007   Attention deficit hyperactivity disorder (ADHD), predominantly inattentive type 02/12/2007   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  PAIN:  Are you having pain? Yes: NPRS scale: 5/10 thoracic/cervical back, 9/10 L knee Pain location: Low back Pain description: achy, sharp Aggravating factors: lifting >5 lbs, standing, walking > 3-4 minutes, sitting > 10-20 minutes Relieving factors: heating pad, muscle relaxers, Meloxicam   OBJECTIVE: (objective measures completed at initial evaluation unless otherwise dated)   DIAGNOSTIC FINDINGS:  08/01/2022: DG Lumbar Spine Complete: IMPRESSION: Minor L5-S1 facet hypertrophy. Otherwise negative radiographs of the lumbar spine.     PATIENT SURVEYS:  FOTO 35%, predicted 51% in 13 visits   SCREENING FOR RED FLAGS: Bowel or bladder incontinence: No Cauda equina syndrome:  No   COGNITION:           Overall cognitive status: Within functional limits for tasks assessed                          SENSATION: Light touch: WFL   MUSCLE LENGTH: Hamstrings: Moderate tightness BIL     POSTURE: rounded shoulders, forward head, increased lumbar lordosis, and flexed trunk    PALPATION: TTP to BIL thoracic and lumbar paraspinals   PASSIVE ACCESSORIES: Painful CPAs T3-L5   LUMBAR ROM:    Active  AROM   eval  Flexion WNL, pain  Extension WNL, pain  Right lateral flexion WNL, pain  Left lateral flexion WNL  Right rotation WNL  Left rotation WNL   (Blank rows = not tested)   LOWER EXTREMITY ROM:      A/PROM Right eval Left eval  Knee flexion      Knee extension          LOWER EXTREMITY MMT:     MMT Right eval Left eval  Hip flexion 4+/5 4+/5  Hip extension 2/5 2/5  Hip abduction 4/5 4/5  Knee flexion 5/5 5/5  Knee extension 5/5 5/5  Ankle dorsiflexion 5/5 5/5  Ankle plantarflexion 5/5 5/5   (Blank rows = not tested)   LUMBAR SPECIAL TESTS:  Repeated trunk extension x10: (-) Slump: (-) BIL SLR: (+) on Lt   FUNCTIONAL TESTS:  5xSTS: 20 seconds with UE support Squat: 50%, BIL knee pain       GAIT: Distance walked: 20 ft Assistive device utilized: Single point cane Level of assistance: Modified independence Comments: slow gait speed, short stride length       TODAY'S TREATMENT  [facility] Adult PT Treatment:                                                DATE: 09/17/2022 Therapeutic Exercise: Seated marching 3# 2x1' LAQ 3x10 BIL 3# with 3" hold  Standing marching 2x10 BIL 3# Standing hip abduction with UE support 2x10 3# STS 2x10 with 10# KB Ambulation x2 laps in clinic for endurance Standing rows with 2 7# cables 2x10 Standing shoulder extension with 2 7# cables 2x10   [facility] Adult PT Treatment:                                                DATE: 09/13/2022 Aquatic therapy at [facility]- [address] - therapeutic pool temp 92 degrees Pt enters building ambulating with SPC. Treatment took place in water 3.8 to  4 ft 8 in.feet deep depending upon activity.  Pt entered and exited the pool via stair and handrails independently.  Pt pain level 6/10 at initiation of water walking.  Therapeutic Exercise: Walking forward/backwards/side stepping x2 laps each with yellow DB by sides Warrior I pool noodle lift x10 BIL Step ups on submerged step forward/lateral  x10 each BIL Step up and overs on submerged step x10 each BIL Side stepping with yellow DB shoulder ab/adduction x2 laps Forward lunge walk with yellow DB by sides x2 laps Standing trunk rotation with yellow noodle x1' Runners stretch x30" BIL Hamstring stretch x30" BIL At edge of pool, pt performed  following exercise: Squats 2x20 Wall push ups 2x20  Back against wall using yellow DB: Shoulder flexion/extension x20 Shoulder ab/adduction x20 Horizontal ab/adduction x20  Pt requires the buoyancy of water for active assisted exercises with buoyancy supported for strengthening and AROM exercises. Hydrostatic pressure also supports joints by unweighting joint load by at least 50 % in 3-4 feet depth water. 80% in chest to neck deep water. Water will provide assistance with movement using the current and laminar flow while the buoyancy reduces weight bearing. Pt requires the viscosity of the water for resistance with strengthening exercises.  [facility] Adult PT Treatment:                                                DATE: 09/11/2022 Therapeutic Exercise: Seated marching 3# 2x1' LAQ 2x10 BIL 3# with 3" hold  Standing marching 2x10 BIL 3# Standing hip abduction with UE support 2x10 3# STS 2x10 with 10# KB Ambulation x2 laps in clinic for endurance Standing rows with 2 7# cables Standing shoulder extension with 2 3# cables Sled push 20# 2 laps 2x17ft    PATIENT EDUCATION:  Education details: Pt educated on potential underlying pathophysiology, POC, prognosis, FOTO, and HEP Person educated: Patient Education method: Programmer, multimedia, Demonstration, and Handouts Education comprehension: verbalized understanding and returned demonstration     HOME EXERCISE PROGRAM: Access Code: [email or web address] URL: [email or web address] Date: 08/22/2022 Prepared by: Carmelina Dane   Exercises - Hooklying Sequential Leg March and Lower with handhold resistance  - 1 x daily - 7 x weekly - 2 sets - 10  reps - 5 seconds hold - Sidelying Hip Abduction  - 1 x daily - 7 x weekly - 2 sets - 10 reps - 3 seconds hold - Mini Squat with Counter Support  - 1 x daily - 7 x weekly - 3 sets - 10 reps - Seated Hamstring Stretch  - 1 x daily - 7 x weekly - 2 sets - 1 minute hold   ASSESSMENT:   CLINICAL IMPRESSION: Patient presents to PT with high levels of L knee pain and moderate lower back pain today. Session focused on BIL LE strengthening and improving standing activity tolerance. Increased repetitions and resistance as noted. Patient was able to tolerate all prescribed exercises with no adverse effects. She reports 6/10 L knee pain at end of session. Patient continues to benefit from skilled PT services and should be progressed as able to improve functional independence.    OBJECTIVE IMPAIRMENTS Abnormal gait, decreased activity tolerance, decreased balance, decreased endurance, decreased mobility, difficulty walking, decreased ROM, decreased strength, increased edema, impaired flexibility, improper body mechanics, postural dysfunction, obesity, and pain.    ACTIVITY LIMITATIONS carrying, lifting, bending, sitting, standing, squatting, sleeping, stairs, transfers, bed mobility, dressing, hygiene/grooming, and locomotion level   PARTICIPATION LIMITATIONS: meal prep, cleaning, laundry, driving, shopping, community activity, and yard work   PERSONAL FACTORS [place] and 3+ comorbidities: See medical hx  are also affecting patient's functional outcome.    REHAB POTENTIAL: Fair Due to physical fitness, morbid obesity, and time since onset of pain   CLINICAL DECISION MAKING: Stable/uncomplicated   EVALUATION COMPLEXITY: Low     GOALS: Goals reviewed with patient? Yes   SHORT TERM GOALS: Target date: 09/19/2022   Pt will report understanding and adherence to initial HEP in order to promote independence in the management of primary impairments. Baseline:  HEP provided at eval Goal status: INITIAL        LONG TERM GOALS: Target date: 10/17/2022   Pt will achieve a FOTO score of 51% in order to demonstrate improved functional ability as it relates to her primary impairments. Baseline: 35% Goal status: INITIAL   2.  Pt will achieve BIL hip extension strength of 4/5 in order to promote endurance with prolonged walking for exercise. Baseline: 2/5 BIL Goal status: INITIAL   3.  Pt will report ability to stand for 10 minutes in order cook with less limitation. Baseline: Pain with any amount of standing Goal status: INITIAL   4.  Pt will report 0-4/10 pain with global lumbar AROM in order to get dressed with less limitation. Baseline: >8/10 pain in trunk flexion, extension, Rt side bend Goal status: INITIAL   5.  Pt will achieve a 5xSTS without use of hands in less than 18 seconds in order to demonstrate safe transfers for community activity. Baseline: 20 seconds with use of hands Goal status: INITIAL       PLAN: PT FREQUENCY: 2x/week   PT DURATION: 8 weeks   PLANNED INTERVENTIONS: Therapeutic exercises, Therapeutic activity, Neuromuscular re-education, Balance training, Gait training, Patient/Family education, Self Care, Joint mobilization, Joint manipulation, Stair training, Prosthetic training, Aquatic Therapy, Dry Needling, Electrical stimulation, Spinal manipulation, Spinal mobilization, Cryotherapy, Moist heat, Taping, Vasopneumatic device, Traction, Biofeedback, Ionotophoresis 4mg /ml Dexamethasone, Manual therapy, and Re-evaluation.   PLAN FOR NEXT SESSION: Progress core/ hip strengthening and mobility, assess knee pain as able    , PTA 09/17/2022, 5:06 PM

## 2022-09-18 ENCOUNTER — Other Ambulatory Visit: Payer: Self-pay

## 2022-09-18 ENCOUNTER — Other Ambulatory Visit: Payer: Self-pay | Admitting: Emergency Medicine

## 2022-09-19 NOTE — Telephone Encounter (Signed)
Pt called sasying she is completely out and wants to know if the provider has seen the refill request and when will she send it back to the pharmacy.

## 2022-09-20 ENCOUNTER — Telehealth: Payer: Self-pay | Admitting: Family Medicine

## 2022-09-20 ENCOUNTER — Ambulatory Visit: Payer: Commercial Managed Care - HMO

## 2022-09-20 NOTE — Telephone Encounter (Signed)
Copied from [place] [phone]. Topic: General - Other >> Sep 20, 2022 12:03 PM [name] wrote: Patient called in requesting a note that states she is unable to work because of a disability so she can renew her food stamps. Fax [phone]

## 2022-09-20 NOTE — Telephone Encounter (Signed)
Routing to PCP for review.

## 2022-09-23 NOTE — Telephone Encounter (Signed)
Letter has been sent via MyChart

## 2022-09-24 ENCOUNTER — Ambulatory Visit: Payer: Commercial Managed Care - HMO

## 2022-09-25 ENCOUNTER — Encounter: Payer: Self-pay | Admitting: Obstetrics and Gynecology

## 2022-09-25 ENCOUNTER — Ambulatory Visit (INDEPENDENT_AMBULATORY_CARE_PROVIDER_SITE_OTHER): Payer: Commercial Managed Care - HMO | Admitting: Obstetrics and Gynecology

## 2022-09-25 ENCOUNTER — Ambulatory Visit: Payer: Commercial Managed Care - HMO

## 2022-09-25 ENCOUNTER — Other Ambulatory Visit: Payer: Self-pay

## 2022-09-25 VITALS — BP 122/52 | HR 93 | Ht 63.0 in | Wt >= 6400 oz

## 2022-09-25 DIAGNOSIS — F419 Anxiety disorder, unspecified: Secondary | ICD-10-CM | POA: Diagnosis not present

## 2022-09-25 DIAGNOSIS — G8929 Other chronic pain: Secondary | ICD-10-CM

## 2022-09-25 DIAGNOSIS — D500 Iron deficiency anemia secondary to blood loss (chronic): Secondary | ICD-10-CM | POA: Diagnosis not present

## 2022-09-25 DIAGNOSIS — M5459 Other low back pain: Secondary | ICD-10-CM | POA: Diagnosis not present

## 2022-09-25 DIAGNOSIS — M6281 Muscle weakness (generalized): Secondary | ICD-10-CM

## 2022-09-25 DIAGNOSIS — R600 Localized edema: Secondary | ICD-10-CM

## 2022-09-25 DIAGNOSIS — R262 Difficulty in walking, not elsewhere classified: Secondary | ICD-10-CM

## 2022-09-25 DIAGNOSIS — F330 Major depressive disorder, recurrent, mild: Secondary | ICD-10-CM

## 2022-09-25 DIAGNOSIS — N920 Excessive and frequent menstruation with regular cycle: Secondary | ICD-10-CM

## 2022-09-25 MED ORDER — LORAZEPAM 1 MG PO TABS
1.0000 mg | ORAL_TABLET | Freq: Once | ORAL | 0 refills | Status: AC
  Filled 2022-09-25 – 2022-10-09 (×3): qty 1, 1d supply, fill #0

## 2022-09-25 MED ORDER — TRANEXAMIC ACID 650 MG PO TABS
1300.0000 mg | ORAL_TABLET | Freq: Three times a day (TID) | ORAL | 2 refills | Status: DC
  Filled 2022-09-25: qty 30, 28d supply, fill #0
  Filled 2022-10-09: qty 30, 5d supply, fill #0
  Filled 2022-10-09: qty 60, 10d supply, fill #0

## 2022-09-25 NOTE — Therapy (Signed)
OUTPATIENT PHYSICAL THERAPY TREATMENT NOTE   Patient Name: [name] MRN: [id number] DOB:[birth year], 31 y.o., female Today's Date: 09/25/2022  PCP: Charlott Rakes, MD REFERRING PROVIDER: Argentina Donovan, PA-C  END OF SESSION:   PT End of Session - 09/25/22 1825     Visit Number 10    Number of Visits 17    Date for PT Re-Evaluation 10/24/22    Authorization Type [employer]    Authorization Time Period FOTO v6, v10    PT Start Time 1825    PT Stop Time 1910    PT Time Calculation (min) 45 min    Activity Tolerance Patient tolerated treatment well    Behavior During Therapy WFL for tasks assessed/performed              Past Medical History:  Diagnosis Date   Acid reflux    ADHD    Asthma    Clotting disorder ([place])    Depression    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   ahi 5.5 ,mslt 07/01/06(off meds) mean latency 0 , with 4 sorems npsg 04/24/2010 2.2/hr   Exogenous obesity    Narcolepsy cataplexy syndrome 2007   Prior sleep studies starting on September 26 2005, AHI 5.3 per hour,   Poor sleep hygiene    Past Surgical History:  Procedure Laterality Date   none     Patient Active Problem List   Diagnosis Date Noted   Dental abscess 02/21/2022   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 prior episode ([place]) 10/31/2021   OSA (obstructive sleep apnea) 10/15/2021   Depression, major, recurrent, mild ([place]) 04/30/2021   Generalized anxiety disorder 04/30/2021   Migraine 06/02/2017   Intercostal pain 08/25/2016   Knee pain, bilateral 07/14/2016   Venous stasis dermatitis of both lower extremities 05/26/2016   Stye external 03/25/2016   Plantar fasciitis, right 03/25/2016   Leg swelling 11/17/2015   Headache 09/08/2015   Pain of molar 05/26/2015   Sebaceous cyst of left axilla 04/13/2015   Vitamin D deficiency 03/29/2015   Allergic rhinitis 04/11/2010   Iron deficiency anemia 03/09/2010   Esophageal reflux  03/24/2009   MENORRHAGIA 03/17/2009   Morbid obesity with body mass index of 70 and over in adult [facility]) 05/15/2007   Depression 02/12/2007   Attention deficit hyperactivity disorder (ADHD), predominantly inattentive type 02/12/2007   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  PAIN:  Are you having pain? Yes: NPRS scale: 6/10 thoracic/cervical back, 6/10 L knee Pain location: Low back Pain description: achy, sharp Aggravating factors: lifting >5 lbs, standing, walking > 3-4 minutes, sitting > 10-20 minutes Relieving factors: heating pad, muscle relaxers, Meloxicam   OBJECTIVE: (objective measures completed at initial evaluation unless otherwise dated)   DIAGNOSTIC FINDINGS:  08/01/2022: DG Lumbar Spine Complete: IMPRESSION: Minor L5-S1 facet hypertrophy. Otherwise negative radiographs of the lumbar spine.     PATIENT SURVEYS:  FOTO 35%, predicted 51% in 13 visits 09/25/22: 42%   SCREENING FOR RED FLAGS: Bowel or bladder incontinence: No Cauda equina syndrome:  No   COGNITION:           Overall cognitive status: Within functional limits for tasks assessed                          SENSATION: Light touch: WFL   MUSCLE LENGTH: Hamstrings: Moderate tightness BIL     POSTURE: rounded shoulders, forward head, increased lumbar lordosis, and flexed trunk    PALPATION: TTP to BIL thoracic and lumbar paraspinals   PASSIVE ACCESSORIES: Painful CPAs T3-L5   LUMBAR ROM:     Active  AROM  eval  Flexion WNL, pain  Extension WNL, pain  Right lateral flexion WNL, pain  Left lateral flexion WNL  Right rotation WNL  Left rotation WNL   (Blank rows = not tested)   LOWER EXTREMITY ROM:      A/PROM Right eval Left eval  Knee flexion      Knee extension          LOWER EXTREMITY MMT:     MMT Right eval Left eval  Hip flexion 4+/5 4+/5  Hip extension 2/5 2/5  Hip abduction 4/5 4/5  Knee flexion 5/5 5/5  Knee extension 5/5 5/5  Ankle dorsiflexion 5/5 5/5  Ankle plantarflexion 5/5 5/5   (Blank rows = not tested)   LUMBAR SPECIAL TESTS:  Repeated trunk extension x10: (-) Slump: (-) BIL SLR: (+) on Lt   FUNCTIONAL TESTS:  5xSTS: 20 seconds with UE support Squat: 50%, BIL knee pain      GAIT: Distance walked: 20 ft Assistive device utilized: Single point cane Level of assistance: Modified independence Comments: slow gait speed, short stride length       TODAY'S TREATMENT  [facility] Adult PT Treatment:                                                DATE: 09/25/2022 Therapeutic Exercise: LAQ 3x10 BIL 3# with 3" hold  Standing marching 2x10 BIL 3# Standing hip abduction with UE support 2x10 3# STS 2x10 with 10# KB Standing rows with 2 10# cables 2x10 Standing shoulder extension with 2 7# cables 2x10 Sled push 20# 3x81ft  [facility] Adult PT Treatment:                                                DATE: 09/17/2022 Therapeutic Exercise: Seated marching 3# 2x1' LAQ 3x10 BIL 3# with 3" hold  Standing marching 2x10 BIL 3# Standing hip abduction with UE support 2x10 3# STS 2x10 with 10# KB Ambulation x2 laps in clinic for endurance Standing rows with 2 7# cables 2x10 Standing shoulder extension with 2 7# cables 2x10   [facility] Adult PT Treatment:                                                DATE: 09/13/2022 Aquatic therapy at [facility]- [address] - therapeutic pool temp 92 degrees Pt enters building ambulating with SPC. Treatment took  place in water 3.8 to  4 ft 8 in.feet deep depending upon activity.  Pt  entered and exited the pool via stair and handrails independently.  Pt pain level 6/10 at initiation of water walking.  Therapeutic Exercise: Walking forward/backwards/side stepping x2 laps each with yellow DB by sides Warrior I pool noodle lift x10 BIL Step ups on submerged step forward/lateral x10 each BIL Step up and overs on submerged step x10 each BIL Side stepping with yellow DB shoulder ab/adduction x2 laps Forward lunge walk with yellow DB by sides x2 laps Standing trunk rotation with yellow noodle x1' Runners stretch x30" BIL Hamstring stretch x30" BIL At edge of pool, pt performed following exercise: Squats 2x20 Wall push ups 2x20  Back against wall using yellow DB: Shoulder flexion/extension x20 Shoulder ab/adduction x20 Horizontal ab/adduction x20  Pt requires the buoyancy of water for active assisted exercises with buoyancy supported for strengthening and AROM exercises. Hydrostatic pressure also supports joints by unweighting joint load by at least 50 % in 3-4 feet depth water. 80% in chest to neck deep water. Water will provide assistance with movement using the current and laminar flow while the buoyancy reduces weight bearing. Pt requires the viscosity of the water for resistance with strengthening exercises.    PATIENT EDUCATION:  Education details: Pt educated on potential underlying pathophysiology, POC, prognosis, FOTO, and HEP Person educated: Patient Education method: Explanation, Demonstration, and Handouts Education comprehension: verbalized understanding and returned demonstration     HOME EXERCISE PROGRAM: Access Code: [email or web address] URL: [email or web address] Date: 08/22/2022 Prepared by: Carmelina Dane   Exercises - Hooklying Sequential Leg March and Lower with handhold resistance  - 1 x daily - 7 x weekly - 2 sets - 10 reps - 5 seconds hold - Sidelying Hip Abduction   - 1 x daily - 7 x weekly - 2 sets - 10 reps - 3 seconds hold - Mini Squat with Counter Support  - 1 x daily - 7 x weekly - 3 sets - 10 reps - Seated Hamstring Stretch  - 1 x daily - 7 x weekly - 2 sets - 1 minute hold   ASSESSMENT:   CLINICAL IMPRESSION: Patient presents to PT with moderate levels of pain in her L knee and back and reports she has been doing her exercises at home "some." Session today focused on BIL LE strengthening and improving standing activity tolerance. Re-administered FOTO this session with patient making progress towards her LTG. Patient was able to tolerate all prescribed exercises with no adverse effects. Patient continues to benefit from skilled PT services and should be progressed as able to improve functional independence.    OBJECTIVE IMPAIRMENTS Abnormal gait, decreased activity tolerance, decreased balance, decreased endurance, decreased mobility, difficulty walking, decreased ROM, decreased strength, increased edema, impaired flexibility, improper body mechanics, postural dysfunction, obesity, and pain.    ACTIVITY LIMITATIONS carrying, lifting, bending, sitting, standing, squatting, sleeping, stairs, transfers, bed mobility, dressing, hygiene/grooming, and locomotion level   PARTICIPATION LIMITATIONS: meal prep, cleaning, laundry, driving, shopping, community activity, and yard work   PERSONAL FACTORS Fitness and 3+ comorbidities: See medical hx  are also affecting patient's functional outcome.    REHAB POTENTIAL: Fair Due to physical fitness, morbid obesity, and time since onset of pain   CLINICAL DECISION MAKING: Stable/uncomplicated   EVALUATION COMPLEXITY: Low     GOALS: Goals reviewed with patient? Yes   SHORT TERM GOALS: Target date: 09/19/2022   Pt will report understanding and adherence to initial HEP in order to promote independence in the management of primary impairments. Baseline: HEP provided at eval Goal status: Ongoing  09/25/22: Pt  reports occasional compliance       LONG TERM GOALS: Target date: 10/17/2022   Pt will achieve a FOTO score of 51% in order to demonstrate improved functional ability as it relates to her primary impairments. Baseline: 35% Goal status: Progressing 09/25/22: 42%   2.  Pt will achieve BIL hip extension strength of 4/5 in order to promote endurance with prolonged walking for exercise. Baseline: 2/5 BIL Goal status: INITIAL   3.  Pt will report ability to stand for 10 minutes in order cook with less limitation. Baseline: Pain with any amount of standing Goal status: INITIAL   4.  Pt will report 0-4/10 pain with global lumbar AROM in order to get dressed with less limitation. Baseline: >8/10 pain in trunk flexion, extension, Rt side bend Goal status: INITIAL   5.  Pt will achieve a 5xSTS without use of hands in less than 18 seconds in order to demonstrate safe transfers for community activity. Baseline: 20 seconds with use of hands Goal status: INITIAL       PLAN: PT FREQUENCY: 2x/week   PT DURATION: 8 weeks   PLANNED INTERVENTIONS: Therapeutic exercises, Therapeutic activity, Neuromuscular re-education, Balance training, Gait training, Patient/Family education, Self Care, Joint mobilization, Joint manipulation, Stair training, Prosthetic training, Aquatic Therapy, Dry Needling, Electrical stimulation, Spinal manipulation, Spinal mobilization, Cryotherapy, Moist heat, Taping, Vasopneumatic device, Traction, Biofeedback, Ionotophoresis 4mg /ml Dexamethasone, Manual therapy, and Re-evaluation.   PLAN FOR NEXT SESSION: Progress core/ hip strengthening and mobility, assess knee pain as able    , PTA 09/25/2022, 6:26 PM
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
## 2022-09-26 ENCOUNTER — Telehealth: Payer: Self-pay | Admitting: Family Medicine

## 2022-09-26 ENCOUNTER — Other Ambulatory Visit: Payer: Self-pay

## 2022-09-26 NOTE — Telephone Encounter (Signed)
Called patient to schedule lab appointment, there was no answer to the phone call and the call ended up saying invalid number. A mychart message was sent to the patient.

## 2022-09-27 ENCOUNTER — Other Ambulatory Visit: Payer: Self-pay

## 2022-10-01 NOTE — Telephone Encounter (Deleted)
The patient is calling [employer] about the letter that was written by her provider. She was able to access it in my chart but is unable to upload to print off. She states she requested it to be faxed to Social Services at [phone]. Please assist patient further

## 2022-10-02 ENCOUNTER — Other Ambulatory Visit: Payer: Self-pay

## 2022-10-02 ENCOUNTER — Ambulatory Visit: Payer: Commercial Managed Care - HMO

## 2022-10-03 ENCOUNTER — Ambulatory Visit: Payer: Commercial Managed Care - HMO | Admitting: Physical Therapy

## 2022-10-03 ENCOUNTER — Encounter: Payer: Self-pay | Admitting: Physical Therapy

## 2022-10-03 DIAGNOSIS — M6281 Muscle weakness (generalized): Secondary | ICD-10-CM

## 2022-10-03 DIAGNOSIS — M5459 Other low back pain: Secondary | ICD-10-CM | POA: Diagnosis not present

## 2022-10-03 DIAGNOSIS — R600 Localized edema: Secondary | ICD-10-CM

## 2022-10-03 DIAGNOSIS — G8929 Other chronic pain: Secondary | ICD-10-CM

## 2022-10-03 DIAGNOSIS — R262 Difficulty in walking, not elsewhere classified: Secondary | ICD-10-CM

## 2022-10-03 NOTE — Therapy (Signed)
OUTPATIENT PHYSICAL THERAPY TREATMENT NOTE   Patient Name: [name] MRN: [id number] DOB:09-27-1991, 31 y.o., female Today's Date: 10/03/2022  PCP: Hoy Register, MD REFERRING PROVIDER: Anders Simmonds, PA-C  END OF SESSION:   PT End of Session - 10/03/22 1616     Visit Number 11    Number of Visits 17    Date for PT Re-Evaluation 10/24/22    Authorization Type [employer]    Authorization Time Period FOTO v6, v10    PT Start Time 0415    PT Stop Time 0458    PT Time Calculation (min) 43 min    Activity Tolerance Patient tolerated treatment well    Behavior During Therapy [facility] for tasks assessed/performed               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   ahi 5.5 ,mslt 07/01/06(off meds) mean latency 0 , with 4 sorems npsg 04/24/2010 2.2/hr   Exogenous obesity    Narcolepsy cataplexy syndrome 2007   Prior sleep studies starting on September 26 2005, AHI 5.3 per hour,   Poor sleep hygiene    Past Surgical History:  Procedure Laterality Date   none     Patient Active Problem List   Diagnosis Date Noted   Dental abscess 02/21/2022   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 prior episode (HCC) 10/31/2021   OSA (obstructive sleep apnea) 10/15/2021   Depression, major, recurrent, mild (HCC) 04/30/2021   Generalized anxiety disorder 04/30/2021   Migraine 06/02/2017   Intercostal pain 08/25/2016   Knee pain, bilateral 07/14/2016   Venous stasis dermatitis of both lower extremities 05/26/2016   Stye external 03/25/2016   Plantar fasciitis, right 03/25/2016   Leg swelling 11/17/2015   Headache 09/08/2015   Pain of molar 05/26/2015   Sebaceous cyst of left axilla 04/13/2015   Vitamin D deficiency 03/29/2015   Allergic rhinitis 04/11/2010   Iron deficiency anemia 03/09/2010   Esophageal  reflux 03/24/2009   MENORRHAGIA 03/17/2009   Morbid obesity with body mass index of 70 and over in adult [facility]) 05/15/2007   Depression 02/12/2007   Attention deficit hyperactivity disorder (ADHD), predominantly inattentive type 02/12/2007   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  She feels exercise has been somewhat helpful.  PAIN:  Are you having pain? Yes: NPRS scale: 5/10 thoracic/cervical back, 5/10 L knee Pain location: Low back Pain description: achy, sharp Aggravating factors: lifting >5 lbs, standing, walking > 3-4 minutes, sitting > 10-20 minutes Relieving factors: heating pad, muscle relaxers, Meloxicam   OBJECTIVE: (objective measures completed at initial evaluation unless otherwise dated)   DIAGNOSTIC FINDINGS:  08/01/2022: DG Lumbar Spine Complete: IMPRESSION: Minor L5-S1 facet hypertrophy. Otherwise negative radiographs of the lumbar spine.     PATIENT SURVEYS:  FOTO 35%, predicted 51% in 13 visits 09/25/22: 42%  SCREENING FOR RED FLAGS: Bowel or bladder incontinence: No Cauda equina syndrome: No   COGNITION:           Overall cognitive status: Within functional limits for tasks assessed                          SENSATION: Light touch: WFL   MUSCLE LENGTH: Hamstrings: Moderate tightness BIL     POSTURE: rounded shoulders, forward head, increased lumbar lordosis, and flexed trunk    PALPATION: TTP to BIL thoracic and lumbar paraspinals    PASSIVE ACCESSORIES: Painful CPAs T3-L5   LUMBAR ROM:    Active  AROM  eval  Flexion WNL, pain  Extension WNL, pain  Right lateral flexion WNL, pain  Left lateral flexion WNL  Right rotation WNL  Left rotation WNL   (Blank rows = not tested)   LOWER EXTREMITY ROM:      A/PROM Right eval Left eval  Knee flexion      Knee extension          LOWER EXTREMITY MMT:     MMT Right eval Left eval  Hip flexion 4+/5 4+/5  Hip extension 2/5 2/5  Hip abduction 4/5 4/5  Knee flexion 5/5 5/5  Knee extension 5/5 5/5  Ankle dorsiflexion 5/5 5/5  Ankle plantarflexion 5/5 5/5   (Blank rows = not tested)   LUMBAR SPECIAL TESTS:  Repeated trunk extension x10: (-) Slump: (-) BIL SLR: (+) on Lt   FUNCTIONAL TESTS:  5xSTS: 20 seconds with UE support Squat: 50%, BIL knee pain      GAIT: Distance walked: 20 ft Assistive device utilized: Single point cane Level of assistance: Modified independence Comments: slow gait speed, short stride length       TODAY'S TREATMENT    [facility] Adult PT Treatment:                                                DATE: 10/03/2022 Aquatic therapy at [address] - therapeutic pool temp 92 degrees Pt enters building ambulating with SPC. Treatment took place in water 3.8 to  4 ft 8 in.feet deep depending upon activity.  Pt entered and exited the pool via stair and handrails independently.   Therapeutic Exercise: Walking forward/backwards/side stepping x2 laps each with yellow DB by sides Warrior I pool noodle lift x10 BIL Step ups on submerged step forward/lateral 2x10 each BIL Step up and overs on submerged step x10 each BIL Side stepping with yellow DB shoulder ab/adduction x3 laps Forward lunge walk with yellow DB by sides x3 laps Standing trunk rotation with yellow noodle x1' Water circles bil Water circles SL Yellow noodle horizontal abd, HA alternating, flexion/ext, FE alternating Runners stretch x30" BIL Hamstring  stretch x30" BIL At edge of pool, pt performed following exercise: Wall push ups x20  Back against wall using yellow DB: Shoulder flexion/extension x20 Shoulder ab/adduction x20 Horizontal ab/adduction x20 Squats 2x20  Pt requires the buoyancy of water for active assisted exercises with buoyancy supported for strengthening and AROM exercises. Hydrostatic pressure also supports joints by unweighting joint load by at least 50 % in 3-4 feet depth water. 80% in chest to neck deep water. Water will provide assistance with movement using the current and laminar flow while the buoyancy reduces weight bearing. Pt requires the  viscosity of the water for resistance with strengthening exercises.  [facility] Adult PT Treatment:                                                DATE: 09/25/2022 Therapeutic Exercise: LAQ 3x10 BIL 3# with 3" hold  Standing marching 2x10 BIL 3# Standing hip abduction with UE support 2x10 3# STS 2x10 with 10# KB Standing rows with 2 10# cables 2x10 Standing shoulder extension with 2 7# cables 2x10 Sled push 20# 3x58ft  [facility] Adult PT Treatment:                                                DATE: 09/17/2022 Therapeutic Exercise: Seated marching 3# 2x1' LAQ 3x10 BIL 3# with 3" hold  Standing marching 2x10 BIL 3# Standing hip abduction with UE support 2x10 3# STS 2x10 with 10# KB Ambulation x2 laps in clinic for endurance Standing rows with 2 7# cables 2x10 Standing shoulder extension with 2 7# cables 2x10   [facility] Adult PT Treatment:                                                DATE: 09/13/2022 Aquatic therapy at [facility] [address] - therapeutic pool temp 92 degrees Pt enters building ambulating with SPC. Treatment took place in water 3.8 to  4 ft 8 in.feet deep depending upon activity.  Pt entered and exited the pool via stair and handrails independently.  Pt pain level 6/10 at initiation of water walking.  Therapeutic Exercise: Walking forward/backwards/side  stepping x2 laps each with yellow DB by sides Warrior I pool noodle lift x10 BIL Step ups on submerged step forward/lateral x10 each BIL Step up and overs on submerged step x10 each BIL Side stepping with yellow DB shoulder ab/adduction x2 laps Forward lunge walk with yellow DB by sides x2 laps Standing trunk rotation with yellow noodle x1' Runners stretch x30" BIL Hamstring stretch x30" BIL At edge of pool, pt performed following exercise: Squats 2x20 Wall push ups 2x20  Back against wall using yellow DB: Shoulder flexion/extension x20 Shoulder ab/adduction x20 Horizontal ab/adduction x20  Pt requires the buoyancy of water for active assisted exercises with buoyancy supported for strengthening and AROM exercises. Hydrostatic pressure also supports joints by unweighting joint load by at least 50 % in 3-4 feet depth water. 80% in chest to neck deep water. Water will provide assistance with movement using the current and laminar flow while the buoyancy reduces weight bearing. Pt requires the viscosity of the water for resistance with strengthening exercises.    PATIENT EDUCATION:  Education details: Pt educated on potential underlying pathophysiology, POC, prognosis, FOTO, and HEP Person educated: Patient Education method: Explanation, Demonstration, and Handouts Education comprehension: verbalized understanding and returned demonstration     HOME EXERCISE PROGRAM: Access Code: [email or web address] URL: [email or web address] Date: 08/22/2022 Prepared by: Carmelina Dane   Exercises - Hooklying Sequential Leg March and Lower with handhold resistance  - 1 x daily - 7 x weekly - 2 sets - 10 reps - 5 seconds hold - Sidelying Hip Abduction  -  1 x daily - 7 x weekly - 2 sets - 10 reps - 3 seconds hold - Mini Squat with Counter Support  - 1 x daily - 7 x weekly - 3 sets - 10 reps - Seated Hamstring Stretch  - 1 x daily - 7 x weekly - 2 sets - 1 minute hold   ASSESSMENT:   CLINICAL  IMPRESSION: Session today focused on core and hip strengthening in the aquatic environment for use of buoyancy to offload joints and the viscosity of water as resistance during therapeutic exercise.  Patient was able to tolerate all prescribed exercises in the aquatic environment with no adverse effects and reports 4/10 pain at the end of the session. Patient continues to benefit from skilled PT services on land and aquatic based and should be progressed as able to improve functional independence.     OBJECTIVE IMPAIRMENTS Abnormal gait, decreased activity tolerance, decreased balance, decreased endurance, decreased mobility, difficulty walking, decreased ROM, decreased strength, increased edema, impaired flexibility, improper body mechanics, postural dysfunction, obesity, and pain.    ACTIVITY LIMITATIONS carrying, lifting, bending, sitting, standing, squatting, sleeping, stairs, transfers, bed mobility, dressing, hygiene/grooming, and locomotion level   PARTICIPATION LIMITATIONS: meal prep, cleaning, laundry, driving, shopping, community activity, and yard work   PERSONAL FACTORS Fitness and 3+ comorbidities: See medical hx  are also affecting patient's functional outcome.    REHAB POTENTIAL: Fair Due to physical fitness, morbid obesity, and time since onset of pain   CLINICAL DECISION MAKING: Stable/uncomplicated   EVALUATION COMPLEXITY: Low     GOALS: Goals reviewed with patient? Yes   SHORT TERM GOALS: Target date: 09/19/2022   Pt will report understanding and adherence to initial HEP in order to promote independence in the management of primary impairments. Baseline: HEP provided at eval Goal status: Ongoing 09/25/22: Pt reports occasional compliance       LONG TERM GOALS: Target date: 10/17/2022   Pt will achieve a FOTO score of 51% in order to demonstrate improved functional ability as it relates to her primary impairments. Baseline: 35% Goal status: Progressing 09/25/22:  42%   2.  Pt will achieve BIL hip extension strength of 4/5 in order to promote endurance with prolonged walking for exercise. Baseline: 2/5 BIL Goal status: INITIAL   3.  Pt will report ability to stand for 10 minutes in order cook with less limitation. Baseline: Pain with any amount of standing Goal status: INITIAL   4.  Pt will report 0-4/10 pain with global lumbar AROM in order to get dressed with less limitation. Baseline: >8/10 pain in trunk flexion, extension, Rt side bend Goal status: INITIAL   5.  Pt will achieve a 5xSTS without use of hands in less than 18 seconds in order to demonstrate safe transfers for community activity. Baseline: 20 seconds with use of hands Goal status: INITIAL       PLAN: PT FREQUENCY: 2x/week   PT DURATION: 8 weeks   PLANNED INTERVENTIONS: Therapeutic exercises, Therapeutic activity, Neuromuscular re-education, Balance training, Gait training, Patient/Family education, Self Care, Joint mobilization, Joint manipulation, Stair training, Prosthetic training, Aquatic Therapy, Dry Needling, Electrical stimulation, Spinal manipulation, Spinal mobilization, Cryotherapy, Moist heat, Taping, Vasopneumatic device, Traction, Biofeedback, Ionotophoresis 4mg /ml Dexamethasone, Manual therapy, and Re-evaluation.   PLAN FOR NEXT SESSION: Progress core/ hip strengthening and mobility, assess knee pain as able    , PT 10/03/2022, 4:59 PM

## 2022-10-04 ENCOUNTER — Other Ambulatory Visit: Payer: Self-pay

## 2022-10-08 ENCOUNTER — Ambulatory Visit: Payer: Commercial Managed Care - HMO

## 2022-10-08 DIAGNOSIS — M5459 Other low back pain: Secondary | ICD-10-CM | POA: Diagnosis not present

## 2022-10-08 DIAGNOSIS — R262 Difficulty in walking, not elsewhere classified: Secondary | ICD-10-CM

## 2022-10-08 DIAGNOSIS — R600 Localized edema: Secondary | ICD-10-CM

## 2022-10-08 DIAGNOSIS — G8929 Other chronic pain: Secondary | ICD-10-CM

## 2022-10-08 DIAGNOSIS — M6281 Muscle weakness (generalized): Secondary | ICD-10-CM

## 2022-10-08 NOTE — Therapy (Addendum)
OUTPATIENT PHYSICAL THERAPY TREATMENT NOTE/ DISCHARGE SUMMARY   Patient Name: [name] MRN: [id number] DOB:Oct 06, 1991, 31 y.o., female Today's Date: 10/08/2022  PCP: Hoy Register, MD REFERRING PROVIDER: Anders Simmonds, PA-C  END OF SESSION:   PT End of Session - 10/08/22 1623     Visit Number 12    Number of Visits 17    Date for PT Re-Evaluation 10/24/22    Authorization Type [employer]    Authorization Time Period FOTO v6, v10    PT Start Time 1622    PT Stop Time 1700    PT Time Calculation (min) 38 min    Activity Tolerance Patient tolerated treatment well    Behavior During Therapy WFL for tasks assessed/performed                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   ahi 5.5 ,mslt 07/01/06(off meds) mean latency 0 , with 4 sorems npsg 04/24/2010 2.2/hr   Exogenous obesity    Narcolepsy cataplexy syndrome 2007   Prior sleep studies starting on September 26 2005, AHI 5.3 per hour,   Poor sleep hygiene    Past Surgical History:  Procedure Laterality Date   none     Patient Active Problem List   Diagnosis Date Noted   Dental abscess 02/21/2022   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 prior episode (HCC) 10/31/2021   OSA (obstructive sleep apnea) 10/15/2021   Depression, major, recurrent, mild (HCC) 04/30/2021   Generalized anxiety disorder 04/30/2021   Migraine 06/02/2017   Intercostal pain 08/25/2016   Knee pain, bilateral 07/14/2016   Venous stasis dermatitis of both lower extremities 05/26/2016   Stye external 03/25/2016   Plantar fasciitis, right 03/25/2016   Leg swelling 11/17/2015   Headache 09/08/2015   Pain of molar 05/26/2015   Sebaceous cyst of left axilla 04/13/2015   Vitamin D deficiency 03/29/2015   Allergic rhinitis 04/11/2010   Iron deficiency anemia  03/09/2010   Esophageal reflux 03/24/2009   MENORRHAGIA 03/17/2009   Morbid obesity with body mass index of 70 and over in adult [facility]) 05/15/2007   Depression 02/12/2007   Attention deficit hyperactivity disorder (ADHD), predominantly inattentive type 02/12/2007   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 She reports HEP adherence.   PAIN:  Are you having pain? Yes: NPRS scale: 4-5/10 LBP, 8-9/10 Lt knee Pain location: Low back, Lt knee Pain description: achy, sharp Aggravating factors: lifting >5 lbs, standing, walking > 3-4 minutes, sitting > 10-20 minutes Relieving factors: heating pad, muscle relaxers, Meloxicam   OBJECTIVE: (objective measures completed at initial evaluation unless otherwise dated)   DIAGNOSTIC FINDINGS:  08/01/2022: DG Lumbar Spine Complete: IMPRESSION: Minor L5-S1 facet hypertrophy. Otherwise negative radiographs of the lumbar spine.     PATIENT SURVEYS:  FOTO 35%, predicted 51% in 13  visits 09/25/22: 42%   SCREENING FOR RED FLAGS: Bowel or bladder incontinence: No Cauda equina syndrome: No   COGNITION:           Overall cognitive status: Within functional limits for tasks assessed                          SENSATION: Light touch: WFL   MUSCLE LENGTH: Hamstrings: Moderate tightness BIL     POSTURE: rounded shoulders, forward head, increased lumbar lordosis, and flexed trunk    PALPATION: TTP to BIL thoracic and lumbar paraspinals    PASSIVE ACCESSORIES: Painful CPAs T3-L5   LUMBAR ROM:    Active  AROM  eval  Flexion WNL, pain  Extension WNL, pain  Right lateral flexion WNL, pain  Left lateral flexion WNL  Right rotation WNL  Left rotation WNL   (Blank rows = not tested)   LOWER EXTREMITY ROM:      A/PROM Right eval Left eval  Knee flexion      Knee extension          LOWER EXTREMITY MMT:     MMT Right eval Left eval Right 10/08/2022 Left 10/08/2022  Hip flexion 4+/5 4+/5 5/5 5/5  Hip extension 2/5 2/5 3/5 3-/5  Hip abduction 4/5 4/5 5/5 5/5  Knee flexion 5/5 5/5    Knee extension 5/5 5/5    Ankle dorsiflexion 5/5 5/5    Ankle plantarflexion 5/5 5/5     (Blank rows = not tested)   LUMBAR SPECIAL TESTS:  Repeated trunk extension x10: (-) Slump: (-) BIL SLR: (+) on Lt   FUNCTIONAL TESTS:  5xSTS: 20 seconds with UE support Squat: 50%, BIL knee pain  10/08/2022: 5xSTS: 15 seconds with hands on knees      GAIT: Distance walked: 20 ft Assistive device utilized: Single point cane Level of assistance: Modified independence Comments: slow gait speed, short stride length       TODAY'S TREATMENT   [facility] Adult PT Treatment:                                                DATE: 10/08/2022 Therapeutic Exercise: Squat with chair tap with 5kg ball 3x8 Seated LAQ with YTB 2x10 with 3-sec hold on Lt Seated hamstring curl with YTB 2x10 with 3-sec hold on Lt Seated abdominal press-down and alternating hip flexion into black physioball 3x20 Seated trunk rotation stretch x10 BIL Seated BIL active hamstring stretch x32min Manual Therapy: N/A Neuromuscular re-ed: N/A Therapeutic Activity: Re-assessment of objective measures with pt education Modalities: N/A Self Care: N/A   [facility] Adult PT Treatment:                                                DATE: 10/03/2022 Aquatic therapy at [facility]- [address] - therapeutic pool temp 92 degrees Pt enters building ambulating with SPC.  Treatment took place in water 3.8 to  4 ft 8 in.feet deep depending upon activity.  Pt entered and exited the pool via stair and handrails independently.   Therapeutic Exercise: Walking forward/backwards/side stepping x2 laps each with yellow DB by sides Warrior I pool noodle lift x10 BIL Step ups on submerged  step forward/lateral 2x10 each BIL Step up and overs on submerged step x10 each BIL Side stepping with yellow DB shoulder ab/adduction x3 laps Forward lunge walk with yellow DB by sides x3 laps Standing trunk rotation with yellow noodle x1' Water circles bil Water circles SL Yellow noodle horizontal abd, HA alternating, flexion/ext, FE alternating Runners stretch x30" BIL Hamstring stretch x30" BIL At edge of pool, pt performed following exercise: Wall push ups x20  Back against wall using yellow DB: Shoulder flexion/extension x20 Shoulder ab/adduction x20 Horizontal ab/adduction x20 Squats 2x20  Pt requires the buoyancy of water for active assisted exercises with buoyancy supported for strengthening and AROM exercises. Hydrostatic pressure also supports joints by unweighting joint load by at least 50 % in 3-4 feet depth water. 80% in chest to neck deep water. Water will provide assistance with movement using the current and laminar flow while the buoyancy reduces weight bearing. Pt requires the viscosity of the water for resistance with strengthening exercises.  [facility] Adult PT Treatment:                                                DATE: 09/25/2022 Therapeutic Exercise: LAQ 3x10 BIL 3# with 3" hold  Standing marching 2x10 BIL 3# Standing hip abduction with UE support 2x10 3# STS 2x10 with 10# KB Standing rows with 2 10# cables 2x10 Standing shoulder extension with 2 7# cables 2x10 Sled push 20# 3x23ft       PATIENT EDUCATION:  Education details: Pt educated on potential underlying pathophysiology, POC, prognosis, FOTO, and HEP Person educated: Patient Education  method: Programmer, multimedia, Facilities manager, and Handouts Education comprehension: verbalized understanding and returned demonstration     HOME EXERCISE PROGRAM: Access Code: [email or web address] URL: [email or web address] Date: 08/22/2022 Prepared by: Carmelina Dane   Exercises - Hooklying Sequential Leg March and Lower with handhold resistance  - 1 x daily - 7 x weekly - 2 sets - 10 reps - 5 seconds hold - Sidelying Hip Abduction  - 1 x daily - 7 x weekly - 2 sets - 10 reps - 3 seconds hold - Mini Squat with Counter Support  - 1 x daily - 7 x weekly - 3 sets - 10 reps - Seated Hamstring Stretch  - 1 x daily - 7 x weekly - 2 sets - 1 minute hold   ASSESSMENT:   CLINICAL IMPRESSION: Upon re-assessment of objective measures, the pt has made meaningful progress in BIL hip strength and 5xSTS. She tolerated progressed exercise well today and will continue to benefit from skilled PT to address her primary impairments and return to her prior level of function with less limitation.    OBJECTIVE IMPAIRMENTS Abnormal gait, decreased activity tolerance, decreased balance, decreased endurance, decreased mobility, difficulty walking, decreased ROM, decreased strength, increased edema, impaired flexibility, improper body mechanics, postural dysfunction, obesity, and pain.    ACTIVITY LIMITATIONS carrying, lifting, bending, sitting, standing, squatting, sleeping, stairs, transfers, bed mobility, dressing, hygiene/grooming, and locomotion level   PARTICIPATION LIMITATIONS: meal prep, cleaning, laundry, driving, shopping, community activity, and yard work   PERSONAL FACTORS [place] and 3+ comorbidities: See medical hx  are also affecting patient's functional outcome.        GOALS: Goals reviewed with patient? Yes   SHORT TERM GOALS: Target date: 09/19/2022   Pt will report understanding and adherence to initial HEP in order to  promote independence in the management of primary impairments. Baseline:  HEP provided at eval 09/25/22: Pt reports occasional compliance 10/08/2022: Pt reports adherence to her HEP Goal status: ACHIEVED       LONG TERM GOALS: Target date: 10/17/2022   Pt will achieve a FOTO score of 51% in order to demonstrate improved functional ability as it relates to her primary impairments. Baseline: 35% Goal status: Progressing 09/25/22: 42%   2.  Pt will achieve BIL hip extension strength of 4/5 in order to promote endurance with prolonged walking for exercise. Baseline: 2/5 BIL 10/08/2022: 3-/5 to 3/5 Goal status: IN PROGRESS   3.  Pt will report ability to stand for 10 minutes in order cook with less limitation. Baseline: Pain with any amount of standing Goal status: INITIAL   4.  Pt will report 0-4/10 pain with global lumbar AROM in order to get dressed with less limitation. Baseline: >8/10 pain in trunk flexion, extension, Rt side bend Goal status: INITIAL   5.  Pt will achieve a 5xSTS without use of hands in less than 18 seconds in order to demonstrate safe transfers for community activity. Baseline: 20 seconds with use of hands 10/08/2022: 15 seconds with hands on knees Goal status: IN PROGRESS       PLAN: PT FREQUENCY: 2x/week   PT DURATION: 8 weeks   PLANNED INTERVENTIONS: Therapeutic exercises, Therapeutic activity, Neuromuscular re-education, Balance training, Gait training, Patient/Family education, Self Care, Joint mobilization, Joint manipulation, Stair training, Prosthetic training, Aquatic Therapy, Dry Needling, Electrical stimulation, Spinal manipulation, Spinal mobilization, Cryotherapy, Moist heat, Taping, Vasopneumatic device, Traction, Biofeedback, Ionotophoresis 4mg /ml Dexamethasone, Manual therapy, and Re-evaluation.   PLAN FOR NEXT SESSION: Progress core/ hip strengthening and mobility, assess knee pain as able    Vanessa Wasta, PT, DPT 10/08/22 5:01 PM  PHYSICAL THERAPY DISCHARGE SUMMARY  Visits from Start of Care:  12  Current functional level related to goals / functional outcomes: Pt made good progress in her FOTO score, hip strength, 5xSTS, and lumbar ROM.   Remaining deficits: Deficits remain in above measures.   Education / Equipment: HEP   Patient agrees to discharge. Patient goals were partially met. Patient is being discharged due to not returning since the last visit.  Vanessa [place], PT, DPT 01/02/23 1:43 PM

## 2022-10-09 ENCOUNTER — Other Ambulatory Visit (HOSPITAL_COMMUNITY): Payer: Self-pay

## 2022-10-09 ENCOUNTER — Other Ambulatory Visit: Payer: Self-pay | Admitting: Family Medicine

## 2022-10-09 ENCOUNTER — Other Ambulatory Visit: Payer: Self-pay

## 2022-10-09 DIAGNOSIS — K219 Gastro-esophageal reflux disease without esophagitis: Secondary | ICD-10-CM

## 2022-10-09 MED ORDER — OMEPRAZOLE 20 MG PO CPDR
20.0000 mg | DELAYED_RELEASE_CAPSULE | Freq: Two times a day (BID) | ORAL | 2 refills | Status: DC
  Filled 2022-10-09 – 2022-11-12 (×3): qty 60, 30d supply, fill #0
  Filled 2022-12-23 – 2023-01-09 (×3): qty 60, 30d supply, fill #1
  Filled 2023-01-31: qty 60, 30d supply, fill #2

## 2022-10-16 ENCOUNTER — Ambulatory Visit (HOSPITAL_COMMUNITY): Payer: Commercial Managed Care - HMO

## 2022-10-16 ENCOUNTER — Ambulatory Visit: Payer: Commercial Managed Care - HMO

## 2022-10-19 ENCOUNTER — Other Ambulatory Visit (HOSPITAL_COMMUNITY): Payer: Self-pay

## 2022-10-22 ENCOUNTER — Ambulatory Visit: Payer: Commercial Managed Care - HMO

## 2022-10-22 NOTE — Therapy (Incomplete)
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   ahi 5.5 ,mslt 07/01/06(off meds) mean latency 0 , with 4 sorems npsg 04/24/2010 2.2/hr   Exogenous obesity    Narcolepsy cataplexy syndrome 2007   Prior sleep studies starting on September 26 2005, AHI 5.3 per hour,   Poor sleep hygiene    Past Surgical History:  Procedure Laterality Date   none     Patient Active Problem List   Diagnosis Date Noted   Dental abscess 02/21/2022   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 prior episode (HCC) 10/31/2021   OSA (obstructive sleep apnea) 10/15/2021   Depression, major, recurrent, mild (HCC) 04/30/2021   Generalized anxiety disorder 04/30/2021   Migraine 06/02/2017   Intercostal pain 08/25/2016   Knee pain, bilateral 07/14/2016   Venous stasis dermatitis of both lower extremities 05/26/2016   Stye external 03/25/2016   Plantar fasciitis, right 03/25/2016   Leg swelling 11/17/2015   Headache 09/08/2015   Pain of molar 05/26/2015   Sebaceous cyst of left axilla 04/13/2015   Vitamin D deficiency 03/29/2015   Allergic rhinitis 04/11/2010   Iron deficiency anemia 03/09/2010   Esophageal reflux 03/24/2009   MENORRHAGIA 03/17/2009   Morbid obesity with body mass index of 70 and over in adult [facility]) 05/15/2007   Depression 02/12/2007   Attention deficit hyperactivity disorder (ADHD), predominantly inattentive type 02/12/2007   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  Rationale for Evaluation and Treatment Rehabilitation  PERTINENT HISTORY: Depression, Asthma, ADHD  PRECAUTIONS: None  SUBJECTIVE:   *** Pt rates her Lt knee pain as 8-9/10 and LBP as 4-5/10. She reports HEP adherence.   PAIN:  Are you having pain? Yes: NPRS scale: ***4-5/10 LBP, 8-9/10 Lt knee Pain location: Low back, Lt knee Pain description: achy, sharp Aggravating factors: lifting >5 lbs, standing, walking > 3-4 minutes, sitting > 10-20 minutes Relieving factors: heating pad, muscle relaxers, Meloxicam   OBJECTIVE: (objective measures completed at initial evaluation unless otherwise dated)   DIAGNOSTIC FINDINGS:  08/01/2022: DG Lumbar Spine Complete: IMPRESSION: Minor L5-S1 facet hypertrophy. Otherwise negative radiographs of the lumbar spine.     PATIENT SURVEYS:  FOTO 35%, predicted 51% in 13 visits 09/25/22: 42%   SCREENING FOR RED FLAGS: Bowel or bladder incontinence: No Cauda equina syndrome: No   COGNITION:           Overall cognitive status: Within functional limits for tasks assessed                          SENSATION: Light touch: WFL   MUSCLE LENGTH: Hamstrings: Moderate tightness BIL     POSTURE: rounded shoulders, forward head, increased lumbar lordosis, and flexed trunk    PALPATION: TTP to BIL thoracic and lumbar paraspinals   PASSIVE ACCESSORIES: Painful CPAs T3-L5   LUMBAR ROM:    Active  AROM  eval  Flexion WNL, pain  Extension WNL, pain  Right lateral flexion WNL, pain  Left lateral flexion WNL  Right rotation WNL  Left rotation WNL   (Blank rows = not tested)   LOWER EXTREMITY ROM:      A/PROM Right eval Left eval  Knee flexion      Knee extension          LOWER EXTREMITY MMT:     MMT Right eval Left eval Right 10/08/2022 Left 10/08/2022  Hip flexion 4+/5 4+/5 5/5 5/5  Hip extension 2/5 2/5 3/5 3-/5  Hip abduction 4/5 4/5 5/5 5/5   Knee flexion 5/5 5/5    Knee extension 5/5 5/5    Ankle dorsiflexion 5/5 5/5    Ankle plantarflexion 5/5 5/5     (Blank rows = not tested)   LUMBAR SPECIAL TESTS:  Repeated trunk extension x10: (-) Slump: (-) BIL SLR: (+) on Lt   FUNCTIONAL TESTS:  5xSTS: 20 seconds with UE support Squat: 50%, BIL knee pain  10/08/2022: 5xSTS: 15 seconds with hands on knees      GAIT: Distance walked: 20 ft Assistive device utilized: Single point cane Level of assistance: Modified independence Comments: slow gait speed, short stride length       TODAY'S TREATMENT  [facility] Adult PT Treatment:                                                DATE: 10/22/2022 Therapeutic Exercise: Squat with chair tap with 5kg ball 3x8 Seated LAQ with YTB 2x10 with 3-sec hold on Lt Seated hamstring curl with YTB 2x10 with 3-sec hold on Lt Seated abdominal press-down and alternating hip flexion into black physioball 3x20 Seated trunk rotation stretch x10 BIL Seated BIL active hamstring stretch x41min   [facility] Adult PT Treatment:                                                DATE: 10/08/2022 Therapeutic Exercise: Squat with chair tap with 5kg ball 3x8 Seated LAQ with YTB 2x10 with 3-sec hold on Lt Seated hamstring curl with YTB 2x10 with 3-sec hold on Lt Seated abdominal press-down and alternating hip flexion into black physioball 3x20 Seated trunk rotation stretch x10 BIL Seated BIL active hamstring stretch x27min Manual Therapy: N/A Neuromuscular re-ed: N/A Therapeutic Activity: Re-assessment of objective measures with pt education Modalities: N/A Self Care: N/A   [facility] Adult PT Treatment:                                                DATE: 10/03/2022 Aquatic therapy at [facility]- [address] - therapeutic pool temp 92 degrees Pt enters building ambulating with SPC. Treatment took place in water 3.8 to  4 ft 8 in.feet deep depending upon activity.  Pt entered and exited the pool via stair and  handrails independently.   Therapeutic Exercise: Walking forward/backwards/side stepping x2 laps each with yellow DB by sides Warrior I pool noodle lift x10 BIL Step ups on submerged step forward/lateral 2x10 each BIL Step up and overs on submerged  step x10 each BIL Side stepping with yellow DB shoulder ab/adduction x3 laps Forward lunge walk with yellow DB by sides x3 laps Standing trunk rotation with yellow noodle x1' Water circles bil Water circles SL Yellow noodle horizontal abd, HA alternating, flexion/ext, FE alternating Runners stretch x30" BIL Hamstring stretch x30" BIL At edge of pool, pt performed following exercise: Wall push ups x20  Back against wall using yellow DB: Shoulder flexion/extension x20 Shoulder ab/adduction x20 Horizontal ab/adduction x20 Squats 2x20  Pt requires the buoyancy of water for active assisted exercises with buoyancy supported for strengthening and AROM exercises. Hydrostatic pressure also supports joints by unweighting joint load by at least 50 % in 3-4 feet depth water. 80% in chest to neck deep water. Water will provide assistance with movement using the current and laminar flow while the buoyancy reduces weight bearing. Pt requires the viscosity of the water for resistance with strengthening exercises.     PATIENT EDUCATION:  Education details: Pt educated on potential underlying pathophysiology, POC, prognosis, FOTO, and HEP Person educated: Patient Education method: Explanation, Demonstration, and Handouts Education comprehension: verbalized understanding and returned demonstration     HOME EXERCISE PROGRAM: Access Code: [email or web address] URL: [email or web address] Date: 08/22/2022 Prepared by: Vanessa Cana   Exercises - Hooklying Sequential Leg March and Lower with handhold resistance  - 1 x daily - 7 x weekly - 2 sets - 10 reps - 5 seconds hold - Sidelying Hip Abduction  - 1 x daily - 7 x weekly - 2 sets - 10 reps - 3  seconds hold - Mini Squat with Counter Support  - 1 x daily - 7 x weekly - 3 sets - 10 reps - Seated Hamstring Stretch  - 1 x daily - 7 x weekly - 2 sets - 1 minute hold   ASSESSMENT:   CLINICAL IMPRESSION: ***  Upon re-assessment of objective measures, the pt has made meaningful progress in BIL hip strength and 5xSTS. She tolerated progressed exercise well today and will continue to benefit from skilled PT to address her primary impairments and return to her prior level of function with less limitation.    OBJECTIVE IMPAIRMENTS Abnormal gait, decreased activity tolerance, decreased balance, decreased endurance, decreased mobility, difficulty walking, decreased ROM, decreased strength, increased edema, impaired flexibility, improper body mechanics, postural dysfunction, obesity, and pain.    ACTIVITY LIMITATIONS carrying, lifting, bending, sitting, standing, squatting, sleeping, stairs, transfers, bed mobility, dressing, hygiene/grooming, and locomotion level   PARTICIPATION LIMITATIONS: meal prep, cleaning, laundry, driving, shopping, community activity, and yard work   [place] and 3+ comorbidities: See medical hx  are also affecting patient's functional outcome.        GOALS: Goals reviewed with patient? Yes   SHORT TERM GOALS: Target date: 09/19/2022   Pt will report understanding and adherence to initial HEP in order to promote independence in the management of primary impairments. Baseline: HEP provided at eval 09/25/22: Pt reports occasional compliance 10/08/2022: Pt reports adherence to her HEP Goal status: ACHIEVED       LONG TERM GOALS: Target date: 10/17/2022   Pt will achieve a FOTO score of 51% in order to demonstrate improved functional ability as it relates to her primary impairments. Baseline: 35% Goal status: Progressing 09/25/22: 42%   2.  Pt will achieve BIL hip extension strength of 4/5 in order to promote endurance with prolonged walking  for exercise. Baseline: 2/5 BIL 10/08/2022: 3-/5 to 3/5 Goal status: IN PROGRESS   3.  Pt will  report ability to stand for 10 minutes in order cook with less limitation. Baseline: Pain with any amount of standing Goal status: INITIAL   4.  Pt will report 0-4/10 pain with global lumbar AROM in order to get dressed with less limitation. Baseline: >8/10 pain in trunk flexion, extension, Rt side bend Goal status: INITIAL   5.  Pt will achieve a 5xSTS without use of hands in less than 18 seconds in order to demonstrate safe transfers for community activity. Baseline: 20 seconds with use of hands 10/08/2022: 15 seconds with hands on knees Goal status: IN PROGRESS       PLAN: PT FREQUENCY: 2x/week   PT DURATION: 8 weeks   PLANNED INTERVENTIONS: Therapeutic exercises, Therapeutic activity, Neuromuscular re-education, Balance training, Gait training, Patient/Family education, Self Care, Joint mobilization, Joint manipulation, Stair training, Prosthetic training, Aquatic Therapy, Dry Needling, Electrical stimulation, Spinal manipulation, Spinal mobilization, Cryotherapy, Moist heat, Taping, Vasopneumatic device, Traction, Biofeedback, Ionotophoresis 4mg /ml Dexamethasone, Manual therapy, and Re-evaluation.   PLAN FOR NEXT SESSION: Progress core/ hip strengthening and mobility, assess knee pain as able   , PTA 10/22/22 9:08 AM

## 2022-10-24 ENCOUNTER — Ambulatory Visit: Payer: Commercial Managed Care - HMO | Admitting: Obstetrics and Gynecology

## 2022-10-25 ENCOUNTER — Ambulatory Visit: Payer: Commercial Managed Care - HMO

## 2022-10-29 ENCOUNTER — Ambulatory Visit: Payer: Commercial Managed Care - HMO

## 2022-10-29 ENCOUNTER — Ambulatory Visit (HOSPITAL_COMMUNITY): Payer: Commercial Managed Care - HMO

## 2022-10-31 ENCOUNTER — Ambulatory Visit: Payer: Commercial Managed Care - HMO | Admitting: Obstetrics and Gynecology

## 2022-11-01 ENCOUNTER — Other Ambulatory Visit: Payer: Self-pay

## 2022-11-08 ENCOUNTER — Other Ambulatory Visit: Payer: Self-pay

## 2022-11-12 ENCOUNTER — Other Ambulatory Visit: Payer: Self-pay

## 2022-11-12 ENCOUNTER — Ambulatory Visit: Payer: Commercial Managed Care - HMO | Attending: Family Medicine | Admitting: Family Medicine

## 2022-11-12 ENCOUNTER — Encounter: Payer: Self-pay | Admitting: Family Medicine

## 2022-11-12 VITALS — BP 115/78 | HR 96 | Temp 98.3°F | Ht 63.0 in | Wt >= 6400 oz

## 2022-11-12 DIAGNOSIS — Z23 Encounter for immunization: Secondary | ICD-10-CM | POA: Diagnosis not present

## 2022-11-12 DIAGNOSIS — Z0000 Encounter for general adult medical examination without abnormal findings: Secondary | ICD-10-CM

## 2022-11-12 DIAGNOSIS — Z1159 Encounter for screening for other viral diseases: Secondary | ICD-10-CM

## 2022-11-12 DIAGNOSIS — Z6841 Body Mass Index (BMI) 40.0 and over, adult: Secondary | ICD-10-CM
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
## 2022-11-12 NOTE — Patient Instructions (Signed)

## 2022-11-13 ENCOUNTER — Encounter: Payer: Self-pay | Admitting: Family Medicine

## 2022-11-13 LAB — CBC WITH DIFFERENTIAL/PLATELET
Basophils Absolute: 0.1 10*3/uL (ref 0.0–0.2)
Basos: 1 %
EOS (ABSOLUTE): 0.1 10*3/uL (ref 0.0–0.4)
Eos: 1 %
Hematocrit: 29 % — ABNORMAL LOW (ref 34.0–46.6)
Hemoglobin: 7.8 g/dL — ABNORMAL LOW (ref 11.1–15.9)
Immature Grans (Abs): 0 10*3/uL (ref 0.0–0.1)
Immature Granulocytes: 0 %
Lymphocytes Absolute: 2.3 10*3/uL (ref 0.7–3.1)
Lymphs: 29 %
MCH: 16 pg — ABNORMAL LOW (ref 26.6–33.0)
MCHC: 26.9 g/dL — ABNORMAL LOW (ref 31.5–35.7)
MCV: 60 fL — ABNORMAL LOW (ref 79–97)
Monocytes Absolute: 0.8 10*3/uL (ref 0.1–0.9)
Monocytes: 10 %
Neutrophils Absolute: 4.7 10*3/uL (ref 1.4–7.0)
Neutrophils: 59 %
Platelets: 445 10*3/uL (ref 150–450)
RBC: 4.87 x10E6/uL (ref 3.77–5.28)
RDW: 20.4 % — ABNORMAL HIGH (ref 11.7–15.4)
WBC: 8 10*3/uL (ref 3.4–10.8)

## 2022-11-13 LAB — HCV AB W REFLEX TO QUANT PCR: HCV Ab: NONREACTIVE

## 2022-11-13 LAB — HCV INTERPRETATION

## 2022-11-15 ENCOUNTER — Ambulatory Visit: Payer: Commercial Managed Care - HMO

## 2022-11-26 ENCOUNTER — Ambulatory Visit: Payer: Commercial Managed Care - HMO | Attending: Family Medicine | Admitting: Physical Therapy

## 2022-12-23 ENCOUNTER — Ambulatory Visit: Payer: Self-pay

## 2022-12-23 ENCOUNTER — Telehealth: Payer: Self-pay | Admitting: Emergency Medicine

## 2022-12-23 ENCOUNTER — Other Ambulatory Visit: Payer: Self-pay

## 2022-12-23 NOTE — Telephone Encounter (Signed)
Message from Erick Blinks sent at 12/23/2022  2:08 PM EST  Summary: Pain in ankle, no appt soon enough   Pt called to schedule an appt for her ankle, she is in pain. Scheduled next available appt in February. [phone], considering UC or mobile bus          Chief Complaint: right ankle pain Symptoms: limping Frequency: 1 week ago Pertinent Negatives: Patient denies redness, swelling, calf pain, fever Disposition: [] ED /[] Urgent Care (no appt availability in office) / [] Appointment(In office/virtual)/ []  [facility]/ [] Home Care/ [x] Refused Recommended Disposition /[] [facility]/ []  Follow-up with PCP Additional Notes: refused U/C- made pt an appt Wednesday with Cari Mayers PA (can make appts with that provider was going to go to mobile bus.  No appt with PCP   Reason for Disposition  [1] SEVERE pain (e.g., excruciating, unable to walk) AND [2] not improved after 2 hours of pain medicine  Answer Assessment - Initial Assessment Questions 1. ONSET: "When did the pain start?"     1 week ago 2. LOCATION: "Where is the pain located?"      right 3. PAIN: "How bad is the pain?"    (Scale 1-10; or mild, moderate, severe)  - MILD (1-3): doesn't interfere with normal activities.   - MODERATE (4-7): interferes with normal activities (e.g., work or school) or awakens from sleep, limping.   - SEVERE (8-10): excruciating pain, unable to do any normal activities, unable to walk.      severe 4. WORK OR EXERCISE: "Has there been any recent work or exercise that involved this part of the body?"      no 5. CAUSE: "What do you think is causing the ankle pain?"     unsure 6. OTHER SYMPTOMS: "Do you have any other symptoms?" (e.g., calf pain, rash, fever, swelling)     no 7. PREGNANCY: "Is there any chance you are pregnant?" "When was your last menstrual period?"     N/a  Protocols used: Ankle Pain-A-AH

## 2022-12-23 NOTE — Telephone Encounter (Signed)
Copied from [place]. Topic: Referral - Request for Referral >> Dec 23, 2022  2:03 PM Sabas Sous wrote: Has patient seen PCP for this complaint? Yes.   *If NO, is insurance requiring patient see PCP for this issue before PCP can refer them? Referral for which specialty: PT  Preferred provider/office: Return to the previous location  Reason for referral: She wants to resume physical therapy

## 2022-12-23 NOTE — Telephone Encounter (Signed)
Message from Erick Blinks sent at 12/23/2022  2:08 PM EST  Summary: Pain in ankle, no appt soon enough   Pt called to schedule an appt for her ankle, she is in pain. Scheduled next available appt in February. [phone], considering UC or mobile bus        Called pt and unable to leave message, no VM .

## 2022-12-23 NOTE — Telephone Encounter (Signed)
Noted  

## 2022-12-25 ENCOUNTER — Ambulatory Visit: Payer: Commercial Managed Care - HMO | Admitting: Physician Assistant

## 2022-12-27 ENCOUNTER — Encounter (HOSPITAL_COMMUNITY): Payer: Self-pay

## 2022-12-27 ENCOUNTER — Other Ambulatory Visit (HOSPITAL_COMMUNITY): Payer: Self-pay

## 2022-12-27 ENCOUNTER — Other Ambulatory Visit: Payer: Self-pay

## 2022-12-31 ENCOUNTER — Other Ambulatory Visit (HOSPITAL_COMMUNITY): Payer: Self-pay

## 2022-12-31 ENCOUNTER — Other Ambulatory Visit: Payer: Self-pay

## 2023-01-01 ENCOUNTER — Other Ambulatory Visit (HOSPITAL_COMMUNITY): Payer: Self-pay

## 2023-01-01 ENCOUNTER — Encounter (HOSPITAL_COMMUNITY): Payer: Self-pay

## 2023-01-02 ENCOUNTER — Other Ambulatory Visit: Payer: Self-pay

## 2023-01-09 ENCOUNTER — Other Ambulatory Visit (HOSPITAL_COMMUNITY): Payer: Self-pay

## 2023-01-09 ENCOUNTER — Other Ambulatory Visit: Payer: Self-pay

## 2023-01-10 ENCOUNTER — Other Ambulatory Visit: Payer: Self-pay

## 2023-01-10 ENCOUNTER — Other Ambulatory Visit (HOSPITAL_COMMUNITY): Payer: Self-pay

## 2023-01-13 ENCOUNTER — Ambulatory Visit: Payer: Commercial Managed Care - HMO | Attending: Family Medicine

## 2023-01-13 DIAGNOSIS — Z23 Encounter for immunization: Secondary | ICD-10-CM

## 2023-01-13 NOTE — Progress Notes (Signed)
#  2 HPV vaccine administered in left deltoid per protocols.  Information sheet given. Patient denies and pain or discomfort at injection site. Tolerated injection well no reaction.

## 2023-01-16 ENCOUNTER — Ambulatory Visit: Payer: Commercial Managed Care - HMO | Admitting: Physician Assistant

## 2023-01-29 ENCOUNTER — Other Ambulatory Visit: Payer: Self-pay

## 2023-01-29 ENCOUNTER — Ambulatory Visit: Payer: Commercial Managed Care - HMO | Attending: Physician Assistant | Admitting: Physician Assistant

## 2023-01-29 ENCOUNTER — Encounter: Payer: Self-pay | Admitting: Physician Assistant

## 2023-01-29 VITALS — BP 125/88 | HR 108 | Temp 97.9°F | Wt >= 6400 oz

## 2023-01-29 DIAGNOSIS — D509 Iron deficiency anemia, unspecified: Secondary | ICD-10-CM | POA: Diagnosis not present

## 2023-01-29 DIAGNOSIS — M25571 Pain in right ankle and joints of right foot: Secondary | ICD-10-CM | POA: Diagnosis not present

## 2023-01-29 DIAGNOSIS — M25572 Pain in left ankle and joints of left foot: Secondary | ICD-10-CM

## 2023-01-29 DIAGNOSIS — M79642 Pain in left hand: Secondary | ICD-10-CM

## 2023-01-29 DIAGNOSIS — R5383 Other fatigue: Secondary | ICD-10-CM

## 2023-01-29 DIAGNOSIS — M545 Low back pain, unspecified: Secondary | ICD-10-CM

## 2023-01-29 DIAGNOSIS — M79641 Pain in right hand: Secondary | ICD-10-CM | POA: Diagnosis not present

## 2023-01-29 DIAGNOSIS — G8929 Other chronic pain: Secondary | ICD-10-CM

## 2023-01-29 DIAGNOSIS — K0889 Other specified disorders of teeth and supporting structures: Secondary | ICD-10-CM | POA: Diagnosis not present

## 2023-01-29 DIAGNOSIS — E559 Vitamin D deficiency, unspecified: Secondary | ICD-10-CM

## 2023-01-29 MED ORDER — FERROUS SULFATE 325 (65 FE) MG PO TABS
325.0000 mg | ORAL_TABLET | Freq: Every day | ORAL | 0 refills | Status: DC
  Filled 2023-01-29 – 2023-01-31 (×2): qty 30, 30d supply, fill #0

## 2023-01-29 MED ORDER — CYCLOBENZAPRINE HCL 10 MG PO TABS
5.0000 mg | ORAL_TABLET | Freq: Three times a day (TID) | ORAL | 1 refills | Status: DC | PRN
  Filled 2023-01-29 – 2023-01-31 (×2): qty 30, 20d supply, fill #0

## 2023-01-29 NOTE — Progress Notes (Signed)
Patient ID: [name], female   DOB: 1991/04/17, 32 y.o.   MRN: [id number]   [name], is a 32 y.o. female  [id number]  [id number]  DOB - Mar 15, 1991  Chief Complaint  Patient presents with   Ankle Pain       Subjective:   [name] is a 32 y.o. female here today for B hand and B ankle pain for several weeks.  She feels like her iron is low.  No tick bites.  No fevers.  No redness of joints.  No fevers.  Sometimes hands and arms feel numb depending on how she lays at night.  NKI.  Pain is all the time-day and night.    Needs to see a dentist due to broken teeth that cause pain at times.    No problems updated.  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 Authorizing Provider  acetaminophen (TYLENOL) 500 MG tablet Take 1,000 mg by mouth every 6 (six) hours as needed for mild pain.   Yes [provider]  amphetamine-dextroamphetamine (ADDERALL) 20 MG tablet Take 1 tablet (20 mg total) by mouth daily. 08/21/22  Yes Eulis Canner E, NP  buPROPion (WELLBUTRIN XL) 150 MG 24 hr tablet Take 1 tablet (150 mg total) by mouth every morning. 08/21/22  Yes Eulis Canner E, NP  cetirizine (ZYRTEC) 10 MG tablet Take 1 tablet (10 mg total) by mouth daily. 06/30/19  Yes Charlott Rakes, MD  clotrimazole (LOTRIMIN) 1 % cream Apply 1 application topically 2 (two) times daily. 09/15/18  Yes  Charlott Rakes, MD  gabapentin (NEURONTIN) 300 MG capsule Take 1 capsule (300 mg total) by mouth 3 (three) times daily. 08/07/22  Yes Charlott Rakes, MD  hydrOXYzine (ATARAX) 10 MG tablet Take 1 tablet (10 mg total) by mouth 3 (three) times daily as needed. 08/21/22  Yes Eulis Canner E, NP  meloxicam (MOBIC) 7.5 MG tablet Take 1 tablet (7.5 mg total) by mouth daily. 08/01/22  Yes ,  M, PA-C  omeprazole (PRILOSEC) 20 MG capsule Take 1 capsule (20 mg total) by mouth 2 (two) times daily before a meal. 10/09/22  Yes Newlin, Enobong, MD  topiramate (TOPAMAX) 50 MG tablet Take 1 tablet (50 mg total) by mouth 2 (two) times daily. 08/01/22  Yes Argentina Donovan, PA-C  tranexamic acid (LYSTEDA) 650 MG TABS tablet Take 2 tablets (1,300 mg total) by mouth 3 (three) times daily. Take during menses for a maximum of five days 09/25/22  Yes Darliss Cheney, MD  Vitamin D, Ergocalciferol, (DRISDOL) 50000 units CAPS capsule Take 1 capsule (50,000 Units total) by mouth every 7 (seven) days. 11/20/17  Yes Freeman Caldron M, PA-C  cyclobenzaprine (FLEXERIL) 10 MG tablet Take 0.5 tablets (5 mg total) by mouth 3 (three) times daily as needed for muscle spasms. 01/29/23  Freeman Caldron M, PA-C  ferrous sulfate 325 (65 FE) MG tablet Take 1 tablet (325 mg total) by mouth daily. 01/29/23   Argentina Donovan, PA-C  metFORMIN (GLUCOPHAGE XR) 500 MG 24 hr tablet Take 1 tablet (500 mg total) by mouth daily after supper. Patient not taking: Reported on 01/29/2023 01/17/21   Argentina Donovan, PA-C  metFORMIN (GLUCOPHAGE-XR) 500 MG 24 hr tablet TAKE 1 TABLET (500 MG TOTAL) BY MOUTH DAILY AFTER SUPPER. 01/17/21 01/17/22  Argentina Donovan, PA-C  nystatin cream (MYCOSTATIN) Apply to affected area 2 times daily Patient not taking: Reported on 01/29/2023 11/15/17   Langston Masker B, PA-C    ROS: Neg HEENT Neg resp Neg cardiac Neg GI Neg GU Neg MS Neg psych Neg neuro  Objective:   Vitals:   01/29/23 1604 01/29/23  1607  BP:  125/88  Pulse:  (!) 108  Temp: 97.9 F (36.6 C) 97.9 F (36.6 C)  SpO2:  99%  Weight:  (!) 423 lb 12.8 oz (192.2 kg)   Exam General appearance : Awake, alert, not in any distress. Speech Clear. Not toxic looking.  Morbidly obese HEENT: Atraumatic and Normocephalic.  Poor dentition.  No abscesses.  Multiple broken teeth Neck: Supple, no JVD. No cervical lymphadenopathy.  Chest: Good air entry bilaterally, CTAB.  No rales/rhonchi/wheezing CVS: S1 S2 regular, no murmurs.  Full S&ROM of B hands/wrists and ankles.  Normal grip.  No bony TTP.  No erythema or swelling overlying the joints.  Neg finkelsteins.  Neg phalens.  Neg tinel Extremities: B/L Lower Ext shows no edema, both legs are warm to touch Neurology: Awake alert, and oriented X 3, CN II-XII intact, Non focal Skin: No Rash  Data Review Lab Results  Component Value Date   HGBA1C 5.6 08/01/2022   HGBA1C 5.6 01/17/2021   HGBA1C 5.6 12/29/2017    Assessment & Plan   1. Pain in both hands - Uric Acid - TSH - Ambulatory referral to Orthopedic Surgery - cyclobenzaprine (FLEXERIL) 10 MG tablet; Take 0.5 tablets (5 mg total) by mouth 3 (three) times daily as needed for muscle spasms.  Dispense: 30 tablet; Refill: 1  2. Acute bilateral ankle pain Likely secondary to obesity - Uric Acid - TSH - Ambulatory referral to Orthopedic Surgery - cyclobenzaprine (FLEXERIL) 10 MG tablet; Take 0.5 tablets (5 mg total) by mouth 3 (three) times daily as needed for muscle spasms.  Dispense: 30 tablet; Refill: 1  3. Tooth pain - Ambulatory referral to Dentistry  4. Microcytic anemia - Iron, TIBC and Ferritin Panel -cbc 5. Vitamin D deficiency - Vitamin D, 25-hydroxy  6. Fatigue, unspecified type - TSH  7. Chronic midline low back pain without sciatica - cyclobenzaprine (FLEXERIL) 10 MG tablet; Take 0.5 tablets (5 mg total) by mouth 3 (three) times daily as needed for muscle spasms.  Dispense: 30 tablet; Refill:  1    Return in about 4 months (around 05/30/2023) for PCP for chronic conditons.  The patient was given clear instructions to go to ER or return to medical center if symptoms don't improve, worsen or new problems develop. The patient verbalized understanding. The patient was told to call to get lab results if they haven't heard anything in the next week.      Freeman Caldron, PA-C [facility] [place], [place]   01/29/2023, 4:30 PM

## 2023-01-30 ENCOUNTER — Other Ambulatory Visit: Payer: Self-pay

## 2023-01-30 ENCOUNTER — Other Ambulatory Visit: Payer: Self-pay | Admitting: Physician Assistant

## 2023-01-30 DIAGNOSIS — E559 Vitamin D deficiency, unspecified: Secondary | ICD-10-CM

## 2023-01-30 DIAGNOSIS — D509 Iron deficiency anemia, unspecified: Secondary | ICD-10-CM

## 2023-01-30 DIAGNOSIS — R790 Abnormal level of blood mineral: Secondary | ICD-10-CM

## 2023-01-30 LAB — CBC WITH DIFFERENTIAL/PLATELET

## 2023-01-30 MED ORDER — VITAMIN D (ERGOCALCIFEROL) 1.25 MG (50000 UNIT) PO CAPS
50000.0000 [IU] | ORAL_CAPSULE | ORAL | 0 refills | Status: DC
  Filled 2023-01-30 – 2023-01-31 (×2): qty 4, 28d supply, fill #0

## 2023-01-31 ENCOUNTER — Other Ambulatory Visit: Payer: Self-pay

## 2023-01-31 ENCOUNTER — Telehealth: Payer: Self-pay | Admitting: Internal Medicine

## 2023-01-31 LAB — CBC WITH DIFFERENTIAL/PLATELET
Basophils Absolute: 0.1 10*3/uL (ref 0.0–0.2)
Basos: 1 %
EOS (ABSOLUTE): 0.2 10*3/uL (ref 0.0–0.4)
Eos: 2 %
Hematocrit: 28.1 % — ABNORMAL LOW (ref 34.0–46.6)
Hemoglobin: 7.7 g/dL — ABNORMAL LOW (ref 11.1–15.9)
Immature Grans (Abs): 0 10*3/uL (ref 0.0–0.1)
Immature Granulocytes: 0 %
Lymphocytes Absolute: 2.4 10*3/uL (ref 0.7–3.1)
Lymphs: 27 %
MCH: 16.1 pg — ABNORMAL LOW (ref 26.6–33.0)
MCHC: 27.4 g/dL — ABNORMAL LOW (ref 31.5–35.7)
MCV: 59 fL — ABNORMAL LOW (ref 79–97)
Monocytes Absolute: 0.8 10*3/uL (ref 0.1–0.9)
Monocytes: 9 %
Neutrophils Absolute: 5.5 10*3/uL (ref 1.4–7.0)
Neutrophils: 61 %
Platelets: 468 10*3/uL — ABNORMAL HIGH (ref 150–450)
RBC: 4.77 x10E6/uL (ref 3.77–5.28)
RDW: 21.2 % — ABNORMAL HIGH (ref 11.7–15.4)
WBC: 8.9 10*3/uL (ref 3.4–10.8)

## 2023-01-31 LAB — IRON,TIBC AND FERRITIN PANEL
Ferritin: 11 ng/mL — ABNORMAL LOW (ref 15–150)
Iron Saturation: 3 % — CL (ref 15–55)
Iron: 11 ug/dL — ABNORMAL LOW (ref 27–159)
Total Iron Binding Capacity: 331 ug/dL (ref 250–450)
UIBC: 320 ug/dL (ref 131–425)

## 2023-01-31 LAB — VITAMIN D 25 HYDROXY (VIT D DEFICIENCY, FRACTURES): Vit D, 25-Hydroxy: 8.7 ng/mL — ABNORMAL LOW (ref 30.0–100.0)

## 2023-01-31 LAB — URIC ACID: Uric Acid: 3.4 mg/dL (ref 2.6–6.2)

## 2023-01-31 LAB — TSH: TSH: 1.82 u[IU]/mL (ref 0.450–4.500)

## 2023-01-31 NOTE — Telephone Encounter (Signed)
Scheduled appt per 2/15 referral. Pt is aware of appt date and time. Pt is aware to arrive 15 mins prior to appt time and to bring and updated insurance card. Pt is aware of appt location.   °

## 2023-02-07 ENCOUNTER — Other Ambulatory Visit: Payer: Self-pay | Admitting: *Deleted

## 2023-02-07 DIAGNOSIS — D649 Anemia, unspecified: Secondary | ICD-10-CM

## 2023-02-11 ENCOUNTER — Encounter: Payer: Self-pay | Admitting: Physician Assistant

## 2023-02-11 ENCOUNTER — Ambulatory Visit (INDEPENDENT_AMBULATORY_CARE_PROVIDER_SITE_OTHER): Payer: Commercial Managed Care - HMO

## 2023-02-11 ENCOUNTER — Inpatient Hospital Stay: Payer: Commercial Managed Care - HMO | Attending: Internal Medicine | Admitting: Internal Medicine

## 2023-02-11 ENCOUNTER — Inpatient Hospital Stay: Payer: Commercial Managed Care - HMO

## 2023-02-11 ENCOUNTER — Other Ambulatory Visit: Payer: Self-pay

## 2023-02-11 ENCOUNTER — Other Ambulatory Visit: Payer: Self-pay | Admitting: Internal Medicine

## 2023-02-11 ENCOUNTER — Ambulatory Visit (INDEPENDENT_AMBULATORY_CARE_PROVIDER_SITE_OTHER): Payer: Commercial Managed Care - HMO | Admitting: Physician Assistant

## 2023-02-11 VITALS — BP 146/94 | HR 100 | Temp 98.0°F | Resp 20 | Wt >= 6400 oz

## 2023-02-11 DIAGNOSIS — G47419 Narcolepsy without cataplexy: Secondary | ICD-10-CM | POA: Insufficient documentation

## 2023-02-11 DIAGNOSIS — J45909 Unspecified asthma, uncomplicated: Secondary | ICD-10-CM | POA: Insufficient documentation

## 2023-02-11 DIAGNOSIS — Z87891 Personal history of nicotine dependence: Secondary | ICD-10-CM | POA: Insufficient documentation

## 2023-02-11 DIAGNOSIS — F32A Depression, unspecified: Secondary | ICD-10-CM | POA: Diagnosis not present

## 2023-02-11 DIAGNOSIS — K219 Gastro-esophageal reflux disease without esophagitis: Secondary | ICD-10-CM | POA: Diagnosis not present

## 2023-02-11 DIAGNOSIS — M25571 Pain in right ankle and joints of right foot: Secondary | ICD-10-CM

## 2023-02-11 DIAGNOSIS — D500 Iron deficiency anemia secondary to blood loss (chronic): Secondary | ICD-10-CM

## 2023-02-11 DIAGNOSIS — M1712 Unilateral primary osteoarthritis, left knee: Secondary | ICD-10-CM | POA: Diagnosis not present

## 2023-02-11 DIAGNOSIS — M25562 Pain in left knee: Secondary | ICD-10-CM | POA: Diagnosis not present

## 2023-02-11 DIAGNOSIS — G8929 Other chronic pain: Secondary | ICD-10-CM | POA: Diagnosis not present

## 2023-02-11 DIAGNOSIS — Z79899 Other long term (current) drug therapy: Secondary | ICD-10-CM | POA: Diagnosis not present

## 2023-02-11 DIAGNOSIS — D649 Anemia, unspecified: Secondary | ICD-10-CM

## 2023-02-11 DIAGNOSIS — Z6841 Body Mass Index (BMI) 40.0 and over, adult: Secondary | ICD-10-CM

## 2023-02-11 DIAGNOSIS — N920 Excessive and frequent menstruation with regular cycle: Secondary | ICD-10-CM | POA: Diagnosis not present

## 2023-02-11 LAB — CBC WITH DIFFERENTIAL (CANCER CENTER ONLY)
Abs Immature Granulocytes: 0.03 10*3/uL (ref 0.00–0.07)
Basophils Absolute: 0 10*3/uL (ref 0.0–0.1)
Basophils Relative: 1 %
Eosinophils Absolute: 0.1 10*3/uL (ref 0.0–0.5)
Eosinophils Relative: 2 %
HCT: 27 % — ABNORMAL LOW (ref 36.0–46.0)
Hemoglobin: 7.7 g/dL — ABNORMAL LOW (ref 12.0–15.0)
Immature Granulocytes: 0 %
Lymphocytes Relative: 26 %
Lymphs Abs: 2.1 10*3/uL (ref 0.7–4.0)
MCH: 16.2 pg — ABNORMAL LOW (ref 26.0–34.0)
MCHC: 28.5 g/dL — ABNORMAL LOW (ref 30.0–36.0)
MCV: 56.8 fL — ABNORMAL LOW (ref 80.0–100.0)
Monocytes Absolute: 0.7 10*3/uL (ref 0.1–1.0)
Monocytes Relative: 9 %
Neutro Abs: 5.1 10*3/uL (ref 1.7–7.7)
Neutrophils Relative %: 62 %
Platelet Count: 442 10*3/uL — ABNORMAL HIGH (ref 150–400)
RBC: 4.75 MIL/uL (ref 3.87–5.11)
RDW: 23.2 % — ABNORMAL HIGH (ref 11.5–15.5)
WBC Count: 8.1 10*3/uL (ref 4.0–10.5)
nRBC: 0 % (ref 0.0–0.2)

## 2023-02-11 LAB — CMP (CANCER CENTER ONLY)
ALT: 10 U/L (ref 0–44)
AST: 9 U/L — ABNORMAL LOW (ref 15–41)
Albumin: 3.5 g/dL (ref 3.5–5.0)
Alkaline Phosphatase: 108 U/L (ref 38–126)
Anion gap: 6 (ref 5–15)
BUN: 8 mg/dL (ref 6–20)
CO2: 25 mmol/L (ref 22–32)
Calcium: 8.5 mg/dL — ABNORMAL LOW (ref 8.9–10.3)
Chloride: 106 mmol/L (ref 98–111)
Creatinine: 0.47 mg/dL (ref 0.44–1.00)
GFR, Estimated: >60 mL/min (ref >60)
Glucose, Bld: 91 mg/dL (ref 70–99)
Potassium: 3.9 mmol/L (ref 3.5–5.1)
Sodium: 137 mmol/L (ref 135–145)
Total Bilirubin: 0.4 mg/dL (ref 0.3–1.2)
Total Protein: 7.6 g/dL (ref 6.5–8.1)

## 2023-02-11 LAB — FOLATE: Folate: 7.6 ng/mL (ref >5.9)

## 2023-02-11 LAB — VITAMIN B12: Vitamin B-12: 511 pg/mL (ref 180–914)

## 2023-02-11 LAB — IRON AND IRON BINDING CAPACITY (CC-WL,HP ONLY)
Iron: 11 ug/dL — ABNORMAL LOW (ref 28–170)
Saturation Ratios: 3 % — ABNORMAL LOW (ref 10.4–31.8)
TIBC: 414 ug/dL (ref 250–450)
UIBC: 403 ug/dL (ref 148–442)

## 2023-02-11 LAB — FERRITIN: Ferritin: 6 ng/mL — ABNORMAL LOW (ref 11–307)

## 2023-02-11 NOTE — Progress Notes (Signed)
[place] [phone]   [phone]  CONSULT NOTE  REFERRING PHYSICIAN: Dr. Charlott Rakes  REASON FOR CONSULTATION:  85 old African-American female with iron deficiency anemia  HPI [name] is a 32 y.o. female with past medical history significant for GERD, ADHD, asthma, depression, morbid obesity, narcolepsy cataplexy syndrome.  The patient was seen by her primary care provider complaining of pain in both hands and ankles that has been going on for several weeks.  She also has some numbness and tingling's in these areas.  Her blood work at that time on 01/29/2023 showed hemoglobin of 7.7, hematocrit 28.1% with MCV of 59%.  The patient has a history of anemia that has been going on for a while.  On August 01, 2022 her hemoglobin was down to 6.8 and hematocrit 25.9% with MCV of 56.  Iron study on 01/29/2023 showed low serum iron of 11 with iron saturation of 3% and ferritin level of 11.  TSH and uric acid were normal.  The patient was referred to me today for evaluation and recommendation regarding her persistent iron deficiency anemia.  The patient has a history of menorrhagia that has been going on for long time.  The patient was treated with iron infusion few years ago but not recently.  She started oral iron tablet few days ago by her primary care provider.  She mentioned that her menstruation usually last between 6-7 days sometimes with heavy clots. When seen today she continues to complain of increasing fatigue and weakness as well as shortness of breath with exertion but no dizzy spells.  She has craving for ice.  She denied having any chest pain, cough or hemoptysis.  She has no nausea, vomiting, diarrhea or constipation.  She has no headache or visual changes. Family history significant for mother with hypertension and asthma.  Father had prostate cancer and alcohol abuse. The patient is single and has no children.  She is currently  unemployed.  She smoked for around 15 years and quit in May 2023.  She drinks alcohol occasionally and also smokes marijuana. 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   ahi 5.5 ,mslt 07/01/06(off meds) mean latency 0 , with 4 sorems npsg 04/24/2010 2.2/hr   Exogenous obesity    Narcolepsy cataplexy syndrome 2007   Prior sleep studies starting on September 26 2005, AHI 5.3 per hour,   Poor sleep hygiene     Past Surgical History:  Procedure Laterality Date   none      Family History  Problem Relation Age of Onset   Hypertension Mother    Asthma Mother    Alcohol abuse Father    Asthma Brother    Alcohol abuse Maternal Grandmother     Social History Social History   Tobacco Use   Smoking status: Light Smoker    Types: Cigarettes   Smokeless tobacco: Former    Quit date: 04/2022   Tobacco comments:    2 cigarettes/ day  Vaping Use   Vaping Use: Never used  Substance Use Topics   Alcohol use: Yes    Comment: ocassional   Drug use: Yes    Types: Marijuana    Allergies  Allergen Reactions   Banana Itching   Food Itching    All fruits  Current Outpatient Medications  Medication Sig Dispense Refill   acetaminophen (TYLENOL) 500 MG tablet Take 1,000 mg by mouth every 6 (six) hours as needed for mild pain.     amphetamine-dextroamphetamine (ADDERALL) 20 MG tablet Take 1 tablet (20 mg total) by mouth daily. 30 tablet 0   buPROPion (WELLBUTRIN XL) 150 MG 24 hr tablet Take 1 tablet (150 mg total) by mouth every morning. 30 tablet 3   cetirizine (ZYRTEC) 10 MG tablet Take 1 tablet (10 mg total) by mouth daily. 30 tablet 11   clotrimazole (LOTRIMIN) 1 % cream Apply 1 application topically 2 (two) times daily. 30 g 1   cyclobenzaprine (FLEXERIL) 10 MG tablet Take 0.5 tablets (5 mg total) by mouth 3 (three) times daily as needed for muscle spasms. 30  tablet 1   ferrous sulfate 325 (65 FE) MG tablet Take 1 tablet (325 mg total) by mouth daily. 30 tablet 0   gabapentin (NEURONTIN) 300 MG capsule Take 1 capsule (300 mg total) by mouth 3 (three) times daily. 90 capsule 2   hydrOXYzine (ATARAX) 10 MG tablet Take 1 tablet (10 mg total) by mouth 3 (three) times daily as needed. 90 tablet 3   meloxicam (MOBIC) 7.5 MG tablet Take 1 tablet (7.5 mg total) by mouth daily. 30 tablet 1   metFORMIN (GLUCOPHAGE XR) 500 MG 24 hr tablet Take 1 tablet (500 mg total) by mouth daily after supper. (Patient not taking: Reported on 01/29/2023) 30 tablet 2   metFORMIN (GLUCOPHAGE-XR) 500 MG 24 hr tablet TAKE 1 TABLET (500 MG TOTAL) BY MOUTH DAILY AFTER SUPPER. 30 tablet 2   nystatin cream (MYCOSTATIN) Apply to affected area 2 times daily (Patient not taking: Reported on 01/29/2023) 30 g 0   omeprazole (PRILOSEC) 20 MG capsule Take 1 capsule (20 mg total) by mouth 2 (two) times daily before a meal. 60 capsule 2   topiramate (TOPAMAX) 50 MG tablet Take 1 tablet (50 mg total) by mouth 2 (two) times daily. 60 tablet 3   tranexamic acid (LYSTEDA) 650 MG TABS tablet Take 2 tablets (1,300 mg total) by mouth 3 (three) times daily. Take during menses for a maximum of five days 60 tablet 2   Vitamin D, Ergocalciferol, (DRISDOL) 1.25 MG (50000 UNIT) CAPS capsule Take 1 capsule (50,000 Units total) by mouth every 7 (seven) days. 16 capsule 0   No current facility-administered medications for this visit.    Review of Systems  Constitutional: positive for fatigue Eyes: negative Ears, nose, mouth, throat, and face: negative Respiratory: positive for dyspnea on exertion Cardiovascular: negative Gastrointestinal: negative Genitourinary:negative Integument/breast: negative Hematologic/lymphatic: negative Musculoskeletal:negative Neurological: negative Behavioral/Psych: positive for fatigue and sleep disturbance Endocrine: negative Allergic/Immunologic: negative  Physical  Exam  [id number], healthy, no distress, well nourished, well developed, and obese SKIN: Pale skin and mucous membrane HEAD: Normocephalic, No masses, lesions, tenderness or abnormalities EYES: Pale sclera EARS: External ears normal, Canals clear OROPHARYNX:no exudate, no erythema, and lips, buccal mucosa, and tongue normal  NECK: supple, no adenopathy, no JVD LYMPH:  no palpable lymphadenopathy, no hepatosplenomegaly BREAST:not examined LUNGS: clear to auscultation , and palpation HEART: regular rate & rhythm, no murmurs, and no gallops ABDOMEN:abdomen soft, non-tender, obese, normal bowel sounds, and no masses or organomegaly BACK: Back symmetric, no curvature., No CVA tenderness EXTREMITIES:no joint deformities, effusion, or inflammation, no edema  NEURO: alert & oriented x 3 with fluent speech, no focal motor/sensory deficits  PERFORMANCE STATUS: ECOG 1  LABORATORY DATA: Lab Results  Component Value  Date   WBC 8.9 01/29/2023   HGB 7.7 (L) 01/29/2023   HCT 28.1 (L) 01/29/2023   MCV 59 (L) 01/29/2023   PLT 468 (H) 01/29/2023      Chemistry      Component Value Date/Time   NA 138 08/03/2022 1924   NA 140 08/01/2022 1603   K 3.6 08/03/2022 1924   CL 108 08/03/2022 1924   CO2 23 08/03/2022 1924   BUN 7 08/03/2022 1924   BUN 10 08/01/2022 1603   CREATININE 0.58 08/03/2022 1924   CREATININE 0.48 (L) 03/28/2015 1450      Component Value Date/Time   CALCIUM 8.7 (L) 08/03/2022 1924   ALKPHOS 113 01/17/2021 1420   AST 14 01/17/2021 1420   ALT 13 01/17/2021 1420   BILITOT 0.5 01/17/2021 1420       RADIOGRAPHIC STUDIES: No results found.  ASSESSMENT: This is a very pleasant 32 years old African-American female with severe iron deficiency anemia secondary to menorrhagia.  The patient is also morbidly obese and has narcolepsy.  She was so sleepy most of the time during the visit   PLAN: I had a lengthy discussion with the patient today about her current condition and  treatment options. The patient had severe iron deficiency because of her persistent menorrhagia and also lack of iron intake. She started oral iron tablet recently but no improvement in her condition. I recommend for the patient to proceed with iron infusion with Venofer 500 Mg IV weekly for 3 weeks to start later this week. I also advised her to continue on the oral iron tablets. I will see her back for follow-up visit in 2 months for evaluation and repeat CBC, iron study and ferritin. The patient has morbid obesity with her young age and I strongly recommend for her to discuss with her primary care physician referral for weight loss clinic which may improve a lot of her issues. She was advised to call immediately if she has any other concerning symptoms in the interval. The patient voices understanding of current disease status and treatment options and is in agreement with the current care plan.  All questions were answered. The patient knows to call the clinic with any problems, questions or concerns. We can certainly see the patient much sooner if necessary.  Thank you so much for allowing me to participate in the care of [place]. I will continue to follow up the patient with you and assist in her care.  The total time spent in the appointment was 60 minutes.  Disclaimer: This note was dictated with voice recognition software. Similar sounding words can inadvertently be transcribed and may not be corrected upon review.   Eilleen Kempf February 11, 2023, 11:34 AM

## 2023-02-11 NOTE — Progress Notes (Signed)
Office Visit Note   Patient: [name]           Date of Birth: [birth year]           MRN: [id number] Visit Date: 02/11/2023              Requested by: Argentina Donovan, PA-C [address] [place],  [place] [address] PCP: Charlott Rakes, MD  Chief Complaint  Patient presents with   Left Knee - Pain   Right Ankle - Pain      HPI: Patient is a pleasant 32 year old woman who presents today with left knee pain and right ankle pain.  Denies any particular injury.  She does use a cane for ambulation because of her left knee pain.  She has had a history of this several years ago.  She also is having ongoing problems with her back and other joints.  She has a BMI today of 75.  She has not really done any kind of treatment for her knee and ankle other than water aerobics and water physical therapy which she thinks helps.  Assessment & Plan: Visit Diagnoses:  1. Chronic pain of left knee   2. Pain in right ankle and joints of right foot   3. Morbid obesity with body mass index of 70 and over in adult [facility])     Plan: I had a long talk with the patient today clinically she has significant arthritis in her left knee and some changes in her right ankle.  Do not appreciate any instability in either.  She understands that her weight is certainly a contributing factor.  She will continue to progress with arthritis in multiple joints.  We talked about weight loss and we will refer her to the colon wellness clinic.  Also discussed getting her back with water therapy.  Talked about Voltaren gel and other things she could try.  I do think she would be a good candidate for an injection into her knee and perhaps her ankle.  I would refer this to be done under ultrasound guidance given her habitus.  She is not interested in an injection but may contact me if she changes her mind  Follow-Up Instructions: No follow-ups on file.   Ortho Exam  Patient is alert, oriented, no adenopathy,  well-dressed, normal affect, normal respiratory effort. Left knee no effusion but difficult to examine.  No redness no erythema.  She has some global pain medially and grinding over the patellofemoral joint good varus valgus stability.  Right ankle.  She is neurovascular intact no redness no erythema mild soft tissue swelling.  Global tenderness she has good dorsiflexion plantarflexion eversion inversion strength.  Palpable dorsalis pedis pulse no sign of infection  Imaging: XR Ankle Complete Right  Result Date: 02/11/2023 3 views of her ankle demonstrate some joint space narrowing overall well-maintained alignment in the mortise.  No acute fractures  XR Knee 1-2 Views Left  Result Date: 02/11/2023 Radiographs of her left knee were obtained in 3 projections.  She has moderate plus degenerative changes of both medial compartment and patellofemoral joint.  She has periarticular osteophytes with sclerotic changes and almost complete loss of joint space and both the medial and patellofemoral joint.  Significant progression of arthritis since previous films in 2019  No images are attached to the encounter.  Labs: Lab Results  Component Value Date   HGBA1C 5.6 08/01/2022   HGBA1C 5.6 01/17/2021   HGBA1C 5.6 12/29/2017  LABURIC 3.4 01/29/2023   REPTSTATUS 07/04/2017 FINAL 07/01/2017   CULT MODERATE NON-GROUPABLE BETA STREPTOCOCCUS 07/01/2017     Lab Results  Component Value Date   ALBUMIN 3.5 02/11/2023   ALBUMIN 3.9 01/17/2021   ALBUMIN 3.7 05/05/2018    No results found for: "MG" Lab Results  Component Value Date   VD25OH 8.7 (L) 01/29/2023   VD25OH 14.9 (L) 11/19/2017   VD25OH 9 (L) 03/28/2015    No results found for: "PREALBUMIN"    Latest Ref Rng & Units 02/11/2023   11:22 AM 01/29/2023    4:39 PM 11/12/2022    4:24 PM  CBC EXTENDED  WBC 4.0 - 10.5 K/uL 8.1  8.9  8.0   RBC 3.87 - 5.11 MIL/uL 4.75  4.77  4.87   Hemoglobin 12.0 - 15.0 g/dL 7.7  7.7  7.8   HCT 36.0 - 46.0  % 27.0  28.1  29.0   Platelets 150 - 400 K/uL 442  468  445   NEUT# 1.7 - 7.7 K/uL 5.1  5.5  4.7   Lymph# 0.7 - 4.0 K/uL 2.1  2.4  2.3      There is no height or weight on file to calculate BMI.  Orders:  Orders Placed This Encounter  Procedures   XR Knee 1-2 Views Left   XR Ankle Complete Right   Ambulatory referral to Physical Therapy   Amb Ref to Medical Weight Management   No orders of the defined types were placed in this encounter.    Procedures: No procedures performed  Clinical Data: No additional findings.  ROS:  All other systems negative, except as noted in the HPI. Review of Systems  Objective: Vital Signs: There were no vitals taken for this visit.  Specialty Comments:  No specialty comments available.  PMFS History: Patient Active Problem List   Diagnosis Date Noted   Dental abscess 02/21/2022   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 prior episode ([place]) 10/31/2021   OSA (obstructive sleep apnea) 10/15/2021   Depression, major, recurrent, mild ([facility]) 04/30/2021   Generalized anxiety disorder 04/30/2021   Migraine 06/02/2017   Intercostal pain 08/25/2016   Knee pain, bilateral 07/14/2016   Venous stasis dermatitis of both lower extremities 05/26/2016   Stye external 03/25/2016   Plantar fasciitis, right 03/25/2016   Leg swelling 11/17/2015   Headache 09/08/2015   Pain of molar 05/26/2015   Sebaceous cyst of left axilla 04/13/2015   Vitamin D deficiency 03/29/2015   Allergic rhinitis 04/11/2010   Iron deficiency anemia 03/09/2010   Esophageal reflux 03/24/2009   MENORRHAGIA 03/17/2009   Morbid obesity with body mass index of 70 and over in adult [facility]) 05/15/2007   Depression 02/12/2007   Attention deficit hyperactivity disorder (ADHD), predominantly inattentive type 02/12/2007   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   Past Medical History:  Diagnosis Date   Acid reflux    ADHD    Asthma     Clotting disorder ([place])    Depression    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   ahi 5.5 ,mslt 07/01/06(off meds) mean latency 0 , with 4 sorems npsg 04/24/2010 2.2/hr   Exogenous obesity    Narcolepsy cataplexy syndrome 2007   Prior sleep studies starting on September 26 2005, AHI 5.3 per hour,   Poor sleep hygiene     Family History  Problem Relation Age of Onset   Hypertension Mother    Asthma Mother    Alcohol abuse Father  Asthma Brother    Alcohol abuse Maternal Grandmother     Past Surgical History:  Procedure Laterality Date   none     Social History   Occupational History   Not on file  Tobacco Use   Smoking status: Light Smoker    Types: Cigarettes   Smokeless tobacco: Former    Quit date: 04/2022   Tobacco comments:    2 cigarettes/ day  Vaping Use   Vaping Use: Never used  Substance and Sexual Activity   Alcohol use: Yes    Comment: ocassional   Drug use: Yes    Types: Marijuana   Sexual activity: Not on file

## 2023-02-12 ENCOUNTER — Encounter: Payer: Self-pay | Admitting: Internal Medicine

## 2023-02-18 ENCOUNTER — Other Ambulatory Visit: Payer: Self-pay | Admitting: Family Medicine

## 2023-02-18 DIAGNOSIS — J302 Other seasonal allergic rhinitis: Secondary | ICD-10-CM

## 2023-02-18 MED ORDER — CETIRIZINE HCL 10 MG PO TABS
10.0000 mg | ORAL_TABLET | Freq: Every day | ORAL | 2 refills | Status: DC
  Filled 2023-02-18 – 2023-03-13 (×5): qty 30, 30d supply, fill #0

## 2023-02-18 NOTE — Telephone Encounter (Signed)
Requested Prescriptions  Pending Prescriptions Disp Refills   cetirizine (ZYRTEC) 10 MG tablet 30 tablet 2    Sig: Take 1 tablet (10 mg total) by mouth daily.     Ear, Nose, and Throat:  Antihistamines 2 Passed - 02/18/2023  4:29 PM      Passed - Cr in normal range and within 360 days    Creatinine  Date Value Ref Range Status  02/11/2023 0.47 0.44 - 1.00 mg/dL Final   Creat  Date Value Ref Range Status  03/28/2015 0.48 (L) 0.50 - 1.10 mg/dL Final   Creatinine,U  Date Value Ref Range Status  02/14/2010 89.1 mg/dL Final    Comment:    See lab report for associated comment(s)         Passed - Valid encounter within last 12 months    Recent Outpatient Visits           2 weeks ago Pain in both hands   [place], [place]   3 months ago Annual physical exam   [place], [place], [place]   6 months ago Prediabetes   [place], [place]   12 months ago Dental abscess   [facility], Kriste Basque, NP   1 year ago Microcytic anemia   [place], [place]       Future Appointments             In 2 months Charlott Rakes, MD [place]   In 3 months Charlott Rakes, MD [place]

## 2023-02-18 NOTE — Telephone Encounter (Signed)
Medication Refill - Medication: cetirizine (ZYRTEC) 10 MG tablet   Has the patient contacted their pharmacy? Yes.     Preferred Pharmacy (with phone number or street name):  [place] Phone: [phone]  Fax: [phone]     Has the patient been seen for an appointment in the last year OR does the patient have an upcoming appointment? Yes.    Please assist patient further

## 2023-02-19 ENCOUNTER — Other Ambulatory Visit: Payer: Self-pay

## 2023-02-20 ENCOUNTER — Encounter: Payer: Self-pay | Admitting: Internal Medicine

## 2023-02-24 ENCOUNTER — Other Ambulatory Visit: Payer: Self-pay

## 2023-02-24 ENCOUNTER — Encounter (HOSPITAL_COMMUNITY): Payer: Self-pay

## 2023-02-24 ENCOUNTER — Ambulatory Visit (HOSPITAL_COMMUNITY): Payer: Commercial Managed Care - HMO | Admitting: Psychiatry

## 2023-02-25 ENCOUNTER — Other Ambulatory Visit: Payer: Self-pay

## 2023-02-27 ENCOUNTER — Ambulatory Visit: Payer: Commercial Managed Care - HMO | Attending: Family Medicine

## 2023-02-27 ENCOUNTER — Other Ambulatory Visit: Payer: Self-pay

## 2023-02-27 DIAGNOSIS — Z6841 Body Mass Index (BMI) 40.0 and over, adult: Secondary | ICD-10-CM | POA: Insufficient documentation

## 2023-02-27 DIAGNOSIS — G8929 Other chronic pain: Secondary | ICD-10-CM | POA: Diagnosis not present

## 2023-02-27 DIAGNOSIS — M25562 Pain in left knee: Secondary | ICD-10-CM | POA: Diagnosis not present

## 2023-02-27 DIAGNOSIS — R600 Localized edema: Secondary | ICD-10-CM | POA: Diagnosis not present

## 2023-02-27 DIAGNOSIS — M6281 Muscle weakness (generalized): Secondary | ICD-10-CM

## 2023-02-27 DIAGNOSIS — M25571 Pain in right ankle and joints of right foot: Secondary | ICD-10-CM | POA: Diagnosis not present

## 2023-02-27 NOTE — Therapy (Signed)
OUTPATIENT PHYSICAL THERAPY LOWER EXTREMITY EVALUATION   Patient Name: [name] MRN: [id number] DOB:03/01/1991, 32 y.o., female Today's Date: 02/28/2023  END OF SESSION:  PT End of Session - 02/28/23 0829     Visit Number 1    Number of Visits 17    Date for PT Re-Evaluation 04/25/23    Authorization Type [employer]    PT Start Time 1750   arrived late   PT Stop Time 1830    PT Time Calculation (min) 40 min    Activity Tolerance Patient tolerated treatment well;Patient limited by pain    Behavior During Therapy [facility] for tasks assessed/performed             Past Medical History:  Diagnosis Date   Acid reflux    ADHD    Asthma    Clotting disorder ([place])    Depression    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   ahi 5.5 ,mslt 07/01/06(off meds) mean latency 0 , with 4 sorems npsg 04/24/2010 2.2/hr   Exogenous obesity    Narcolepsy cataplexy syndrome 2007   Prior sleep studies starting on September 26 2005, AHI 5.3 per hour,   Poor sleep hygiene    Past Surgical History:  Procedure Laterality Date   none     Patient Active Problem List   Diagnosis Date Noted   Unilateral primary osteoarthritis, left knee 02/11/2023   Dental abscess 02/21/2022   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 prior episode ([place]) 10/31/2021   OSA (obstructive sleep apnea) 10/15/2021   Depression, major, recurrent, mild (HCC) 04/30/2021   Generalized anxiety disorder 04/30/2021   Migraine 06/02/2017   Intercostal pain 08/25/2016   Knee pain, bilateral 07/14/2016   Venous stasis dermatitis of both lower extremities 05/26/2016   Stye external 03/25/2016   Plantar fasciitis, right 03/25/2016   Leg swelling 11/17/2015   Headache 09/08/2015   Pain of molar 05/26/2015   Sebaceous cyst of left axilla 04/13/2015   Vitamin D deficiency 03/29/2015   Allergic rhinitis 04/11/2010   Iron deficiency anemia 03/09/2010   Esophageal reflux 03/24/2009    MENORRHAGIA 03/17/2009   Morbid obesity with body mass index of 70 and over in adult [facility]) 05/15/2007   Depression 02/12/2007   Attention deficit hyperactivity disorder (ADHD), predominantly inattentive type 02/12/2007   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    PCP: Charlott Rakes, MD  REFERRING PROVIDER: Persons, Bevely Palmer, PA  REFERRING DIAG:  [phone] (ICD-10-CM) - Chronic pain of left knee M25.571 (ICD-10-CM) - Pain in right ankle and joints of right foot E66.01,Z68.45 (ICD-10-CM) - Morbid obesity with body mass index of 70 and over in adult [facility])  THERAPY DIAG:  Chronic pain of left knee - Plan: PT plan of care cert/re-cert  Muscle weakness (generalized) - Plan: PT plan of care cert/re-cert  Localized edema - Plan: PT plan of care cert/re-cert  Pain in right ankle and joints of right foot - Plan: PT plan of care cert/re-cert  Rationale for Evaluation and Treatment: Rehabilitation  ONSET DATE: Chronic  SUBJECTIVE:   SUBJECTIVE STATEMENT: Pt presents to PT with reports of chronic bilateral knee, L>R, and bilateral ankle, R>L, pain and discomfort. She is well known to therapy and has done fairly well in past with aquatic therapy. Denies falls but notes that it has been harder for her to get around, especially with prolonged walking and stairs. She feels limited to the point that she can no longer go to church or other community  activities.   PERTINENT HISTORY: Depression, Asthma, ADHD  PAIN:  Are you having pain?  Yes: NPRS scale: 8/10 Worst: 10/10 Pain location: bilateral knee, L>R Pain description: sharp Aggravating factors: walking, prolonged standing Relieving factors: rest  Are you having pain?  Yes: NPRS scale: 8/10 Worst: 10/10 Pain location: bilateral ankle R>L Pain description: achy Aggravating factors: walking, prolonged standing Relieving factors: rest  PRECAUTIONS: None  WEIGHT BEARING RESTRICTIONS: No  FALLS:  Has patient fallen in  last 6 months? No  LIVING ENVIRONMENT: Lives with: lives with their family Lives in: House/apartment Stairs: No Has following equipment at home: Single point cane  OCCUPATION: Not working  PLOF: Independent  PATIENT GOALS: decrease knee and ankle pain in order to improve comfort and functional ability with home ADLs and community activities like church  OBJECTIVE:   DIAGNOSTIC FINDINGS:   See imaging  PATIENT SURVEYS:  FOTO: 40% function; 56% predicted  COGNITION: Overall cognitive status: Within functional limits for tasks assessed     SENSATION: WFL  POSTURE:  large body body habitus and genu varus  PALPATION: TTP to L knee joint line  LOWER EXTREMITY ROM:  Active ROM Right eval Left eval  Hip flexion    Hip extension    Hip abduction    Hip adduction    Hip internal rotation    Hip external rotation    Knee flexion WNL WNL  Knee extension -3 -5  Ankle dorsiflexion    Ankle plantarflexion    Ankle inversion    Ankle eversion     (Blank rows = not tested)  LOWER EXTREMITY MMT:  MMT Right eval Left eval  Hip flexion 3+/5 3+/5  Hip extension    Hip abduction 3+/5 3+/5  Hip adduction    Hip internal rotation    Hip external rotation    Knee flexion 3+/5 3+/5  Knee extension 3+/5 3+/5  Ankle dorsiflexion    Ankle plantarflexion    Ankle inversion    Ankle eversion     (Blank rows = not tested)  LOWER EXTREMITY SPECIAL TESTS:  DNT  FUNCTIONAL TESTS:  30 Second Sit to Stand: 8 reps - with UE TUG: 14" with SPC  GAIT: Distance walked: 42ft Assistive device utilized: Single point cane Level of assistance: Modified independence Comments: wide BoS; antalgic gait L  TREATMENT: [facility] Adult PT Treatment:                                                DATE: 02/27/2023 Therapeutic Exercise: Quad set x 5 - 5" hold LAQ x 5 each Seated hamstring curl x 10 GTB Seated PF with black band x 10 each  PATIENT EDUCATION:  Education details: eval  findings, LEFS, HEP, POC Person educated: Patient Education method: Explanation, Demonstration, and Handouts Education comprehension: verbalized understanding and returned demonstration  HOME EXERCISE PROGRAM: Access Code: [email or web address] URL: [email or web address] Date: 02/27/2023 Prepared by: Octavio Manns  Exercises - Supine Quadricep Sets  - 1 x daily - 7 x weekly - 2 sets - 10 reps - 5 sec hold - Seated Long Arc Quad  - 1 x daily - 7 x weekly - 3 sets - 10 reps - Seated Hamstring Curl with Anchored Resistance  - 1 x daily - 7 x weekly - 3 sets - 10 reps - green band hold - Seated Ankle Plantarflexion with  Resistance  - 1 x daily - 7 x weekly - 3 sets - 10 reps - black band hold  ASSESSMENT:  CLINICAL IMPRESSION: Patient is a 32 y.o. F who was seen today for physical therapy evaluation and treatment for chronic knee and ankle pain. Physical findings are consistent with referring provider impression as pt has significant decrease in strength and functional mobility. Her body habitus impedes comfortable motion for LE joints. Pt's FOTO score also shows decrease in functional ability below PLOF. Pt would benefit from skilled PT services working on improving strength and mobility in gym and aquatic environments.    OBJECTIVE IMPAIRMENTS: Abnormal gait, decreased activity tolerance, decreased balance, decreased endurance, decreased mobility, difficulty walking, decreased strength, and pain.   ACTIVITY LIMITATIONS: sitting, standing, squatting, stairs, transfers, and locomotion level  PARTICIPATION LIMITATIONS: driving, shopping, community activity, yard work, and church  PERSONAL FACTORS: Fitness, Time since onset of injury/illness/exacerbation, and 3+ comorbidities: Depression, Asthma, ADHD   are also affecting patient's functional outcome.   REHAB POTENTIAL: Good  CLINICAL DECISION MAKING: Evolving/moderate complexity  EVALUATION COMPLEXITY: Moderate   GOALS: Goals  reviewed with patient? No  SHORT TERM GOALS: Target date: 03/20/2023   Pt will be compliant and knowledgeable with initial HEP for improved comfort and carryover Baseline: initial HEP given  Goal status: INITIAL  2.  Pt will self report knee and ankle pain no greater than 7/10 for improved comfort and functional ability Baseline: 10/10 at worst Goal status: INITIAL   LONG TERM GOALS: Target date: 04/24/2023   Pt will improve FOTO function score to no less than 56% as proxy for functional improvement with home ADLs and community activity Baseline: 40% function Goal status: INITIAL   2.  Pt will self report knee and ankle pain no greater than 4/10 for improved comfort and functional ability Baseline: 10/10 at worst Goal status: INITIAL   3.  Pt will increase 30 Second Sit to Stand rep count to no less than 10 reps for improved balance, strength, and functional mobility Baseline: 8 reps - with UE Goal status: INITIAL   4.  Pt will improve TUG to no less than 11" with least restrictive AD for improved comfort and balance Baseline: 14" with SPC Goal status: INITIAL  PLAN:  PT FREQUENCY: 2x/week  PT DURATION: 8 weeks  PLANNED INTERVENTIONS: Therapeutic exercises, Therapeutic activity, Neuromuscular re-education, Balance training, Gait training, Patient/Family education, Self Care, Joint mobilization, Aquatic Therapy, Dry Needling, Electrical stimulation, Cryotherapy, Moist heat, Manual therapy, and Re-evaluation  PLAN FOR NEXT SESSION: assess HEP response, LE strengthening, gait training   Ward Chatters, PT 02/28/2023, 8:42 AM

## 2023-03-03 ENCOUNTER — Other Ambulatory Visit: Payer: Self-pay

## 2023-03-06 ENCOUNTER — Other Ambulatory Visit: Payer: Self-pay

## 2023-03-06 ENCOUNTER — Encounter (INDEPENDENT_AMBULATORY_CARE_PROVIDER_SITE_OTHER): Payer: Commercial Managed Care - HMO | Admitting: Family Medicine

## 2023-03-13 ENCOUNTER — Other Ambulatory Visit: Payer: Self-pay

## 2023-03-18 ENCOUNTER — Ambulatory Visit: Payer: Commercial Managed Care - HMO | Attending: Family Medicine

## 2023-03-18 DIAGNOSIS — M5459 Other low back pain: Secondary | ICD-10-CM | POA: Insufficient documentation

## 2023-03-18 DIAGNOSIS — G8929 Other chronic pain: Secondary | ICD-10-CM

## 2023-03-18 DIAGNOSIS — M25562 Pain in left knee: Secondary | ICD-10-CM | POA: Diagnosis not present

## 2023-03-18 DIAGNOSIS — M25571 Pain in right ankle and joints of right foot: Secondary | ICD-10-CM | POA: Insufficient documentation

## 2023-03-18 DIAGNOSIS — M6281 Muscle weakness (generalized): Secondary | ICD-10-CM | POA: Diagnosis not present

## 2023-03-18 DIAGNOSIS — R600 Localized edema: Secondary | ICD-10-CM | POA: Diagnosis not present

## 2023-03-18 NOTE — Therapy (Signed)
OUTPATIENT PHYSICAL THERAPY TREATMENT NOTE   Patient Name: [name] MRN: [id number] DOB:08/09/1991, 32 y.o., female Today's Date: 03/18/2023  PCP: Charlott Rakes, MD  REFERRING PROVIDER: Persons, Bevely Palmer, [place]   END OF SESSION:   PT End of Session - 03/18/23 1700     Visit Number 2    Number of Visits 17    Date for PT Re-Evaluation 04/25/23    Authorization Type [employer]    PT Start Time 1625   arrived late   PT Stop Time 1655    PT Time Calculation (min) 30 min    Activity Tolerance Patient tolerated treatment well;Patient limited by pain    Behavior During Therapy [facility] for tasks assessed/performed             Past Medical History:  Diagnosis Date   Acid reflux    ADHD    Asthma    Clotting disorder ([place])    Depression    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   ahi 5.5 ,mslt 07/01/06(off meds) mean latency 0 , with 4 sorems npsg 04/24/2010 2.2/hr   Exogenous obesity    Narcolepsy cataplexy syndrome 2007   Prior sleep studies starting on September 26 2005, AHI 5.3 per hour,   Poor sleep hygiene    Past Surgical History:  Procedure Laterality Date   none     Patient Active Problem List   Diagnosis Date Noted   Unilateral primary osteoarthritis, left knee 02/11/2023   Dental abscess 02/21/2022   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 prior episode 10/31/2021   OSA (obstructive sleep apnea) 10/15/2021   Depression, major, recurrent, mild 04/30/2021   Generalized anxiety disorder 04/30/2021   Migraine 06/02/2017   Intercostal pain 08/25/2016   Knee pain, bilateral 07/14/2016   Venous stasis dermatitis of both lower extremities 05/26/2016   Stye external 03/25/2016   Plantar fasciitis, right 03/25/2016   Leg swelling 11/17/2015   Headache 09/08/2015   Pain of molar 05/26/2015   Sebaceous cyst of left axilla 04/13/2015   Vitamin D deficiency 03/29/2015   Allergic rhinitis 04/11/2010   Iron deficiency  anemia 03/09/2010   Esophageal reflux 03/24/2009   MENORRHAGIA 03/17/2009   Morbid obesity with body mass index of 70 and over in adult 05/15/2007   Depression 02/12/2007   Attention deficit hyperactivity disorder (ADHD), predominantly inattentive type 02/12/2007   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 Has been compliant with HEP with no adverse effect.    PAIN:  Are you having pain?  Yes: NPRS scale: 7/10 Worst: 10/10 Pain location: bilateral knee, L>R Pain description: sharp Aggravating factors: walking, prolonged standing Relieving factors: rest   Are you having pain?  Yes: NPRS scale: 5/10 Worst: 10/10 Pain location: bilateral ankle R>L Pain description: achy Aggravating factors: walking, prolonged standing Relieving factors: rest   OBJECTIVE: (objective measures completed at initial evaluation unless otherwise dated)  DIAGNOSTIC FINDINGS:             See imaging   PATIENT SURVEYS:  FOTO: 40% function; 56% predicted   COGNITION: Overall cognitive status: Within functional limits for tasks assessed                         SENSATION: WFL   POSTURE:  large body body habitus and genu varus   PALPATION: TTP to L knee joint line    LOWER EXTREMITY ROM:   Active ROM Right eval Left eval  Hip flexion      Hip extension      Hip abduction      Hip adduction      Hip internal rotation      Hip external rotation      Knee flexion WNL WNL  Knee extension -3 -5  Ankle dorsiflexion      Ankle plantarflexion      Ankle inversion      Ankle eversion       (Blank rows = not tested)   LOWER EXTREMITY MMT:   MMT Right eval Left eval  Hip flexion 3+/5 3+/5  Hip extension      Hip abduction 3+/5 3+/5  Hip adduction      Hip internal rotation      Hip external rotation      Knee flexion 3+/5 3+/5  Knee extension 3+/5 3+/5  Ankle dorsiflexion      Ankle plantarflexion      Ankle inversion      Ankle eversion       (Blank rows = not tested)   LOWER EXTREMITY SPECIAL TESTS:  DNT   FUNCTIONAL TESTS:  30 Second Sit to Stand: 8 reps - with UE TUG: 14" with SPC   GAIT: Distance walked: 46ft Assistive device utilized: Single point cane Level of assistance: Modified independence Comments: wide BoS; antalgic gait L   TREATMENT: [facility] Adult PT Treatment:                                                DATE: 03/18/2023 Therapeutic Exercise: LAQ 2x10 2.5# Seated hamstring 2x10 blue band Supine SLR 2x5 each Ankle DF/inv x 10 RTB STS 3x5 Standing hip abd 2x5 Standing heel raise x 10  [facility] Adult PT Treatment:                                                DATE: 02/27/2023 Therapeutic Exercise: Quad set x 5 - 5" hold LAQ x 5 each Seated hamstring curl x 10 GTB Seated PF with black band x 10 each  PATIENT EDUCATION:  Education details: eval findings, LEFS, HEP, POC Person educated: Patient Education method: Explanation, Demonstration, and Handouts Education comprehension: verbalized understanding and returned demonstration   HOME EXERCISE PROGRAM: Access Code: [email or web address] URL: [email or web address] Date: 02/27/2023 Prepared by: Octavio Manns   Exercises - Supine Quadricep Sets  - 1 x  daily - 7 x weekly - 2 sets - 10 reps - 5 sec hold - Seated Long Arc Quad  - 1 x daily - 7 x weekly - 3 sets - 10 reps - Seated Hamstring Curl with Anchored Resistance  - 1 x daily - 7 x weekly - 3 sets - 10 reps - green band hold - Seated Ankle Plantarflexion with Resistance  - 1 x daily - 7 x weekly - 3 sets - 10 reps - black band hold   ASSESSMENT:   CLINICAL IMPRESSION: Pt tolerated treatment fair but continues to be limited by weakness and complex medical hx. Therapy worked on improving LE strength and functional activity tolerance. Will continue to progress exercises in standing per POC.    OBJECTIVE IMPAIRMENTS: Abnormal gait, decreased activity tolerance, decreased balance, decreased endurance, decreased mobility, difficulty walking, decreased strength, and pain.    ACTIVITY LIMITATIONS: sitting, standing, squatting, stairs, transfers, and locomotion level   PARTICIPATION LIMITATIONS: driving, shopping, community activity, yard work, and church   PERSONAL FACTORS: Fitness, Time since onset of injury/illness/exacerbation, and 3+ comorbidities: Depression, Asthma, ADHD   are also affecting patient's functional outcome.    GOALS: Goals reviewed with patient? No   SHORT TERM GOALS: Target date: 03/20/2023   Pt will be compliant and knowledgeable with initial HEP for improved comfort and carryover Baseline: initial HEP given  Goal status: INITIAL   2.  Pt will self report knee and ankle pain no greater than 7/10 for improved comfort and functional ability Baseline: 10/10 at worst Goal status: INITIAL    LONG TERM GOALS: Target date: 04/24/2023   Pt will improve FOTO function score to no less than 56% as proxy for functional improvement with home ADLs and community activity Baseline: 40% function Goal status: INITIAL    2.  Pt will self report knee and ankle pain no greater than 4/10 for improved comfort and functional ability Baseline: 10/10 at worst Goal status: INITIAL    3.   Pt will increase 30 Second Sit to Stand rep count to no less than 10 reps for improved balance, strength, and functional mobility Baseline: 8 reps - with UE Goal status: INITIAL    4.  Pt will improve TUG to no less than 11" with least restrictive AD for improved comfort and balance Baseline: 14" with SPC Goal status: INITIAL   PLAN:   PT FREQUENCY: 2x/week   PT DURATION: 8 weeks   PLANNED INTERVENTIONS: Therapeutic exercises, Therapeutic activity, Neuromuscular re-education, Balance training, Gait training, Patient/Family education, Self Care, Joint mobilization, Aquatic Therapy, Dry Needling, Electrical stimulation, Cryotherapy, Moist heat, Manual therapy, and Re-evaluation   PLAN FOR NEXT SESSION: assess HEP response, LE strengthening, gait training   Ward Chatters, PT 03/18/2023, 5:00 PM

## 2023-03-20 ENCOUNTER — Ambulatory Visit: Payer: Commercial Managed Care - HMO

## 2023-03-20 DIAGNOSIS — M25562 Pain in left knee: Secondary | ICD-10-CM | POA: Diagnosis not present

## 2023-03-20 DIAGNOSIS — R600 Localized edema: Secondary | ICD-10-CM

## 2023-03-20 DIAGNOSIS — M25571 Pain in right ankle and joints of right foot: Secondary | ICD-10-CM | POA: Diagnosis not present

## 2023-03-20 DIAGNOSIS — M6281 Muscle weakness (generalized): Secondary | ICD-10-CM | POA: Diagnosis not present

## 2023-03-20 DIAGNOSIS — M5459 Other low back pain: Secondary | ICD-10-CM | POA: Diagnosis not present

## 2023-03-20 DIAGNOSIS — G8929 Other chronic pain: Secondary | ICD-10-CM | POA: Diagnosis not present

## 2023-03-20 NOTE — Therapy (Signed)
OUTPATIENT PHYSICAL THERAPY TREATMENT NOTE   Patient Name: HADASAH LAURANCE MRN: [id number] DOB:1991/02/01, 32 y.o., female Today's Date: 03/20/2023  PCP: Charlott Rakes, MD  REFERRING PROVIDER: Persons, [place], [place]   END OF SESSION:   PT End of Session - 03/20/23 1527     Visit Number 3    Number of Visits 17    Date for PT Re-Evaluation 04/25/23    Authorization Type [employer]    PT Start Time 1530    PT Stop Time 1610    PT Time Calculation (min) 40 min    Activity Tolerance Patient tolerated treatment well;Patient limited by pain    Behavior During Therapy [facility] for tasks assessed/performed              Past Medical History:  Diagnosis Date   Acid reflux    ADHD    Asthma    Clotting disorder    Depression    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   ahi 5.5 ,mslt 07/01/06(off meds) mean latency 0 , with 4 sorems npsg 04/24/2010 2.2/hr   Exogenous obesity    Narcolepsy cataplexy syndrome 2007   Prior sleep studies starting on September 26 2005, AHI 5.3 per hour,   Poor sleep hygiene    Past Surgical History:  Procedure Laterality Date   none     Patient Active Problem List   Diagnosis Date Noted   Unilateral primary osteoarthritis, left knee 02/11/2023   Dental abscess 02/21/2022   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 prior episode 10/31/2021   OSA (obstructive sleep apnea) 10/15/2021   Depression, major, recurrent, mild 04/30/2021   Generalized anxiety disorder 04/30/2021   Migraine 06/02/2017   Intercostal pain 08/25/2016   Knee pain, bilateral 07/14/2016   Venous stasis dermatitis of both lower extremities 05/26/2016   Stye external 03/25/2016   Plantar fasciitis, right 03/25/2016   Leg swelling 11/17/2015   Headache 09/08/2015   Pain of molar 05/26/2015   Sebaceous cyst of left axilla 04/13/2015   Vitamin D deficiency 03/29/2015   Allergic rhinitis 04/11/2010   Iron deficiency anemia 03/09/2010    Esophageal reflux 03/24/2009   MENORRHAGIA 03/17/2009   Morbid obesity with body mass index of 70 and over in adult 05/15/2007   Depression 02/12/2007   Attention deficit hyperactivity disorder (ADHD), predominantly inattentive type 02/12/2007   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 Has tried to be HEP compliant, notes she was sore after last session.    PAIN:  Are you having pain?  Yes: NPRS scale: 7/10 Worst: 10/10 Pain location: bilateral knee, L>R Pain description: sharp Aggravating factors: walking, prolonged standing Relieving factors: rest   Are you having pain?  Yes: NPRS scale: 5/10 Worst: 10/10 Pain location: bilateral ankle R>L Pain description: achy Aggravating factors: walking, prolonged standing Relieving factors: rest   OBJECTIVE: (objective measures completed at initial evaluation unless otherwise dated)  DIAGNOSTIC FINDINGS:             See imaging   PATIENT SURVEYS:  FOTO: 40% function; 56% predicted   COGNITION: Overall cognitive status: Within functional limits for tasks assessed                         SENSATION: WFL   POSTURE:  large body body habitus and genu varus   PALPATION: TTP to L knee joint line    LOWER EXTREMITY ROM:   Active ROM Right eval Left eval  Hip flexion      Hip extension      Hip abduction      Hip adduction      Hip internal rotation      Hip external rotation      Knee flexion WNL WNL  Knee extension -3 -5  Ankle dorsiflexion      Ankle plantarflexion      Ankle inversion      Ankle eversion       (Blank rows = not tested)   LOWER EXTREMITY MMT:   MMT Right eval Left eval  Hip flexion 3+/5 3+/5  Hip extension      Hip abduction 3+/5 3+/5  Hip adduction      Hip internal rotation      Hip external rotation      Knee flexion 3+/5 3+/5  Knee extension 3+/5 3+/5  Ankle dorsiflexion      Ankle plantarflexion      Ankle inversion      Ankle eversion       (Blank rows = not tested)   LOWER EXTREMITY SPECIAL TESTS:  DNT   FUNCTIONAL TESTS:  30 Second Sit to Stand: 8 reps - with UE TUG: 14" with SPC   GAIT: Distance walked: 67ft Assistive device utilized: Single point cane Level of assistance: Modified independence Comments: wide BoS; antalgic gait L   TREATMENT: [facility] Adult PT Treatment:                                                DATE: 03/20/2023 Therapeutic Exercise: NuStep lvl 4 UE/LE x 4 min while taking subejctive LAQ 2x10 2.5# Seated hamstring 2x10 blue band Supine SLR 3x5 each STS 2x5 Standing hip abd/ext x 10 each Standing heel raise x 10 Gait rolled into therex amb 224ft x 2 with sitting rest break between  [facility] Adult PT Treatment:                                                DATE: 03/18/2023 Therapeutic Exercise: LAQ 2x10  2.5# Seated hamstring 2x10 blue band Supine SLR 2x5 each Ankle DF/inv x 10 RTB STS 3x5 Standing hip abd 2x5 Standing heel raise x 10  [facility] Adult PT Treatment:                                                DATE: 02/27/2023 Therapeutic Exercise: Quad set x 5 - 5" hold LAQ x 5 each Seated hamstring curl x 10 GTB Seated PF with black band x 10 each   PATIENT EDUCATION:  Education details: eval  findings, LEFS, HEP, POC Person educated: Patient Education method: Explanation, Demonstration, and Handouts Education comprehension: verbalized understanding and returned demonstration   HOME EXERCISE PROGRAM: Access Code: [email or web address] URL: [email or web address] Date: 02/27/2023 Prepared by: Octavio Manns   Exercises - Supine Quadricep Sets  - 1 x daily - 7 x weekly - 2 sets - 10 reps - 5 sec hold - Seated Long Arc Quad  - 1 x daily - 7 x weekly - 3 sets - 10 reps - Seated Hamstring Curl with Anchored Resistance  - 1 x daily - 7 x weekly - 3 sets - 10 reps - green band hold - Seated Ankle Plantarflexion with Resistance  - 1 x daily - 7 x weekly - 3 sets - 10 reps - black band hold   ASSESSMENT:   CLINICAL IMPRESSION: Pt tolerated treatment fair but continues to be limited by weakness and complex medical hx. She was able to tolerate treatment better today, with increased repetitions and difficulty of exercises.Therapy worked on improving LE strength and functional activity tolerance. Will continue to progress exercises in standing per POC.    OBJECTIVE IMPAIRMENTS: Abnormal gait, decreased activity tolerance, decreased balance, decreased endurance, decreased mobility, difficulty walking, decreased strength, and pain.    ACTIVITY LIMITATIONS: sitting, standing, squatting, stairs, transfers, and locomotion level   PARTICIPATION LIMITATIONS: driving, shopping, community activity, yard work, and church   PERSONAL FACTORS: Fitness, Time since onset of injury/illness/exacerbation, and 3+ comorbidities: Depression, Asthma, ADHD   are also affecting patient's functional outcome.    GOALS: Goals reviewed with patient? No   SHORT TERM GOALS: Target date: 03/20/2023   Pt will be compliant and knowledgeable with initial HEP for improved comfort and carryover Baseline: initial HEP given  Goal status: INITIAL   2.  Pt will self report knee and ankle pain no greater than 7/10 for  improved comfort and functional ability Baseline: 10/10 at worst Goal status: INITIAL    LONG TERM GOALS: Target date: 04/24/2023   Pt will improve FOTO function score to no less than 56% as proxy for functional improvement with home ADLs and community activity Baseline: 40% function Goal status: INITIAL    2.  Pt will self report knee and ankle pain no greater than 4/10 for improved comfort and functional ability Baseline: 10/10 at worst Goal status: INITIAL    3.  Pt will increase 30 Second Sit to Stand rep count to no less than 10 reps for improved balance, strength, and functional mobility Baseline: 8 reps - with UE Goal status: INITIAL    4.  Pt will improve TUG to no less than 11" with least restrictive AD for improved comfort and balance Baseline: 14" with SPC Goal status: INITIAL   PLAN:   PT FREQUENCY: 2x/week   PT DURATION: 8 weeks   PLANNED INTERVENTIONS:  Therapeutic exercises, Therapeutic activity, Neuromuscular re-education, Balance training, Gait training, Patient/Family education, Self Care, Joint mobilization, Aquatic Therapy, Dry Needling, Electrical stimulation, Cryotherapy, Moist heat, Manual therapy, and Re-evaluation   PLAN FOR NEXT SESSION: assess HEP response, LE strengthening, gait training   Ward Chatters, PT 03/20/2023, 4:12 PM

## 2023-03-24 NOTE — Therapy (Unsigned)
OUTPATIENT PHYSICAL THERAPY TREATMENT NOTE   Patient Name: [name] MRN: [id number] DOB:1991/07/16, 32 y.o., female Today's Date: 03/25/2023  PCP: Hoy Register, MD  REFERRING PROVIDER: Persons, [place], [place]   END OF SESSION:   PT End of Session - 03/25/23 1616     Visit Number 4    Number of Visits 17    Date for PT Re-Evaluation 04/25/23    Authorization Type [employer]    PT Start Time 1615    PT Stop Time 1655    PT Time Calculation (min) 40 min    Activity Tolerance Patient tolerated treatment well;Patient limited by pain    Behavior During Therapy [facility] for tasks assessed/performed              Past Medical History:  Diagnosis Date   Acid reflux    ADHD    Asthma    Clotting disorder    Depression    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   ahi 5.5 ,mslt 07/01/06(off meds) mean latency 0 , with 4 sorems npsg 04/24/2010 2.2/hr   Exogenous obesity    Narcolepsy cataplexy syndrome 2007   Prior sleep studies starting on September 26 2005, AHI 5.3 per hour,   Poor sleep hygiene    Past Surgical History:  Procedure Laterality Date   none     Patient Active Problem List   Diagnosis Date Noted   Unilateral primary osteoarthritis, left knee 02/11/2023   Dental abscess 02/21/2022   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 prior episode 10/31/2021   OSA (obstructive sleep apnea) 10/15/2021   Depression, major, recurrent, mild 04/30/2021   Generalized anxiety disorder 04/30/2021   Migraine 06/02/2017   Intercostal pain 08/25/2016   Knee pain, bilateral 07/14/2016   Venous stasis dermatitis of both lower extremities 05/26/2016   Stye external 03/25/2016   Plantar fasciitis, right 03/25/2016   Leg swelling 11/17/2015   Headache 09/08/2015   Pain of molar 05/26/2015   Sebaceous cyst of left axilla 04/13/2015   Vitamin D deficiency 03/29/2015   Allergic rhinitis 04/11/2010   Iron deficiency anemia 03/09/2010    Esophageal reflux 03/24/2009   MENORRHAGIA 03/17/2009   Morbid obesity with body mass index of 70 and over in adult 05/15/2007   Depression 02/12/2007   Attention deficit hyperactivity disorder (ADHD), predominantly inattentive type 02/12/2007   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    REFERRING DIAG:  M25.562,G89.29 (ICD-10-CM) - Chronic pain of left knee M25.571 (ICD-10-CM) - Pain in right ankle and joints of right foot E66.01,Z68.45 (ICD-10-CM) - Morbid obesity with body mass index of 70 and over in adult [facility])  THERAPY DIAG:  Chronic pain of left knee  Muscle weakness (generalized)  Pain in right ankle and joints of right foot  Rationale for Evaluation and Treatment Rehabilitation  PERTINENT HISTORY: Depression, Asthma, ADHD   PRECAUTIONS: None   SUBJECTIVE:  SUBJECTIVE STATEMENT:  Continued reports of knee and ankle pain, 8/10 L knee and 4/10 R ankle   PAIN:  Are you having pain?  Yes: NPRS scale: 7/10 Worst: 10/10 Pain location: bilateral knee, L>R Pain description: sharp Aggravating factors: walking, prolonged standing Relieving factors: rest   Are you having pain?  Yes: NPRS scale: 5/10 Worst: 10/10 Pain location: bilateral ankle R>L Pain description: achy Aggravating factors: walking, prolonged standing Relieving factors: rest   OBJECTIVE: (objective measures completed at initial evaluation unless otherwise dated)  DIAGNOSTIC FINDINGS:             See imaging   PATIENT SURVEYS:  FOTO: 40% function; 56% predicted   COGNITION: Overall cognitive status: Within functional limits for tasks assessed                         SENSATION: WFL   POSTURE:  large body body habitus and genu varus   PALPATION: TTP to L knee joint line   LOWER EXTREMITY ROM:   Active ROM  Right eval Left eval  Hip flexion      Hip extension      Hip abduction      Hip adduction      Hip internal rotation      Hip external rotation      Knee flexion WNL WNL  Knee extension -3 -5  Ankle dorsiflexion      Ankle plantarflexion      Ankle inversion      Ankle eversion       (Blank rows = not tested)   LOWER EXTREMITY MMT:   MMT Right eval Left eval  Hip flexion 3+/5 3+/5  Hip extension      Hip abduction 3+/5 3+/5  Hip adduction      Hip internal rotation      Hip external rotation      Knee flexion 3+/5 3+/5  Knee extension 3+/5 3+/5  Ankle dorsiflexion      Ankle plantarflexion      Ankle inversion      Ankle eversion       (Blank rows = not tested)   LOWER EXTREMITY SPECIAL TESTS:  DNT   FUNCTIONAL TESTS:  30 Second Sit to Stand: 8 reps - with UE TUG: 14" with SPC   GAIT: Distance walked: 38ft Assistive device utilized: Single point cane Level of assistance: Modified independence Comments: wide BoS; antalgic gait L   TREATMENT: [facility] Adult PT Treatment:                                                DATE: 03/25/23 Therapeutic Exercise: NuStep lvl 4 UE/LE x 6 min  LAQ 2x12 2.5# B alternating Seated hamstring 2x12 blue band Bil Supine SLR 2x10 Bil Supine SAQs 2x10 Bil STS 2x5, arms crossed and arms extended Standing heel raise against wall 12x2   [facility] Adult PT Treatment:                                                DATE: 03/20/2023 Therapeutic Exercise: NuStep lvl 4 UE/LE x 4 min while taking subejctive LAQ 2x10 2.5# Seated hamstring 2x10 blue band Supine SLR 3x5  each STS 2x5 Standing hip abd/ext x 10 each Standing heel raise x 10 Gait rolled into therex amb 285ft x 2 with sitting rest break between  [facility] Adult PT Treatment:                                                DATE: 03/18/2023 Therapeutic Exercise: LAQ 2x10 2.5# Seated hamstring 2x10 blue band Supine SLR 2x5 each Ankle DF/inv x 10 RTB STS 3x5 Standing hip abd  2x5 Standing heel raise x 10  [facility] Adult PT Treatment:                                                DATE: 02/27/2023 Therapeutic Exercise: Quad set x 5 - 5" hold LAQ x 5 each Seated hamstring curl x 10 GTB Seated PF with black band x 10 each   PATIENT EDUCATION:  Education details: eval findings, LEFS, HEP, POC Person educated: Patient Education method: Explanation, Demonstration, and Handouts Education comprehension: verbalized understanding and returned demonstration   HOME EXERCISE PROGRAM: Access Code: [email or web address] URL: [email or web address] Date: 02/27/2023 Prepared by: Edwinna Areola   Exercises - Supine Quadricep Sets  - 1 x daily - 7 x weekly - 2 sets - 10 reps - 5 sec hold - Seated Long Arc Quad  - 1 x daily - 7 x weekly - 3 sets - 10 reps - Seated Hamstring Curl with Anchored Resistance  - 1 x daily - 7 x weekly - 3 sets - 10 reps - green band hold - Seated Ankle Plantarflexion with Resistance  - 1 x daily - 7 x weekly - 3 sets - 10 reps - black band hold   ASSESSMENT:   CLINICAL IMPRESSION: Increased reps, resistance and difficulty as noted.  Patient able to complete all requested tasks with only mild discomfort reported.  Rest breaks given as needed.    OBJECTIVE IMPAIRMENTS: Abnormal gait, decreased activity tolerance, decreased balance, decreased endurance, decreased mobility, difficulty walking, decreased strength, and pain.    ACTIVITY LIMITATIONS: sitting, standing, squatting, stairs, transfers, and locomotion level   PARTICIPATION LIMITATIONS: driving, shopping, community activity, yard work, and church   PERSONAL FACTORS: Fitness, Time since onset of injury/illness/exacerbation, and 3+ comorbidities: Depression, Asthma, ADHD   are also affecting patient's functional outcome.    GOALS: Goals reviewed with patient? No   SHORT TERM GOALS: Target date: 03/20/2023   Pt will be compliant and knowledgeable with initial HEP for improved comfort and  carryover Baseline: initial HEP given  Goal status: INITIAL   2.  Pt will self report knee and ankle pain no greater than 7/10 for improved comfort and functional ability Baseline: 10/10 at worst Goal status: INITIAL    LONG TERM GOALS: Target date: 04/24/2023   Pt will improve FOTO function score to no less than 56% as proxy for functional improvement with home ADLs and community activity Baseline: 40% function Goal status: INITIAL    2.  Pt will self report knee and ankle pain no greater than 4/10 for improved comfort and functional ability Baseline: 10/10 at worst Goal status: INITIAL    3.  Pt will increase 30 Second Sit to Stand rep count to no less than 10 reps for  improved balance, strength, and functional mobility Baseline: 8 reps - with UE Goal status: INITIAL    4.  Pt will improve TUG to no less than 11" with least restrictive AD for improved comfort and balance Baseline: 14" with SPC Goal status: INITIAL   PLAN:   PT FREQUENCY: 2x/week   PT DURATION: 8 weeks   PLANNED INTERVENTIONS: Therapeutic exercises, Therapeutic activity, Neuromuscular re-education, Balance training, Gait training, Patient/Family education, Self Care, Joint mobilization, Aquatic Therapy, Dry Needling, Electrical stimulation, Cryotherapy, Moist heat, Manual therapy, and Re-evaluation   PLAN FOR NEXT SESSION: assess HEP response, LE strengthening, gait training   Hildred LaserJeffrey M Render Marley, PT 03/25/2023, 5:01 PM

## 2023-03-25 ENCOUNTER — Ambulatory Visit: Payer: Commercial Managed Care - HMO

## 2023-03-25 DIAGNOSIS — M6281 Muscle weakness (generalized): Secondary | ICD-10-CM

## 2023-03-25 DIAGNOSIS — G8929 Other chronic pain: Secondary | ICD-10-CM | POA: Diagnosis not present

## 2023-03-25 DIAGNOSIS — M5459 Other low back pain: Secondary | ICD-10-CM | POA: Diagnosis not present

## 2023-03-25 DIAGNOSIS — M25571 Pain in right ankle and joints of right foot: Secondary | ICD-10-CM

## 2023-03-25 DIAGNOSIS — M25562 Pain in left knee: Secondary | ICD-10-CM | POA: Diagnosis not present

## 2023-03-25 DIAGNOSIS — R600 Localized edema: Secondary | ICD-10-CM | POA: Diagnosis not present

## 2023-03-26 ENCOUNTER — Encounter (INDEPENDENT_AMBULATORY_CARE_PROVIDER_SITE_OTHER): Payer: Commercial Managed Care - HMO | Admitting: Family Medicine

## 2023-03-26 NOTE — Therapy (Unsigned)
OUTPATIENT PHYSICAL THERAPY TREATMENT NOTE   Patient Name: [name] MRN: [id number] DOB:May 23, 1991, 32 y.o., female Today's Date: 03/27/2023  PCP: Hoy Register, MD  REFERRING PROVIDER: Persons, [place], [place]   END OF SESSION:   PT End of Session - 03/27/23 1713     Visit Number 5    Number of Visits 17    Date for PT Re-Evaluation 04/25/23    Authorization Type [employer]    PT Start Time 1715   patient arrived late for session   PT Stop Time 1745    PT Time Calculation (min) 30 min    Activity Tolerance Patient tolerated treatment well;Patient limited by pain    Behavior During Therapy [facility] for tasks assessed/performed               Past Medical History:  Diagnosis Date   Acid reflux    ADHD    Asthma    Clotting disorder    Depression    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   ahi 5.5 ,mslt 07/01/06(off meds) mean latency 0 , with 4 sorems npsg 04/24/2010 2.2/hr   Exogenous obesity    Narcolepsy cataplexy syndrome 2007   Prior sleep studies starting on September 26 2005, AHI 5.3 per hour,   Poor sleep hygiene    Past Surgical History:  Procedure Laterality Date   none     Patient Active Problem List   Diagnosis Date Noted   Unilateral primary osteoarthritis, left knee 02/11/2023   Dental abscess 02/21/2022   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 prior episode 10/31/2021   OSA (obstructive sleep apnea) 10/15/2021   Depression, major, recurrent, mild 04/30/2021   Generalized anxiety disorder 04/30/2021   Migraine 06/02/2017   Intercostal pain 08/25/2016   Knee pain, bilateral 07/14/2016   Venous stasis dermatitis of both lower extremities 05/26/2016   Stye external 03/25/2016   Plantar fasciitis, right 03/25/2016   Leg swelling 11/17/2015   Headache 09/08/2015   Pain of molar 05/26/2015   Sebaceous cyst of left axilla 04/13/2015   Vitamin D deficiency 03/29/2015   Allergic rhinitis 04/11/2010    Iron deficiency anemia 03/09/2010   Esophageal reflux 03/24/2009   MENORRHAGIA 03/17/2009   Morbid obesity with body mass index of 70 and over in adult 05/15/2007   Depression 02/12/2007   Attention deficit hyperactivity disorder (ADHD), predominantly inattentive type 02/12/2007   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)  THERAPY DIAG:  Chronic pain of left knee  Muscle weakness (generalized)  Pain in right ankle and joints of right foot  Rationale for Evaluation and Treatment Rehabilitation  PERTINENT HISTORY: Depression, Asthma, ADHD   PRECAUTIONS: None   SUBJECTIVE:  SUBJECTIVE STATEMENT:  No increased discomfort reported despite increased work load at last session and rainy weather   PAIN:  Are you having pain?  Yes: NPRS scale: 7/10 Worst: 10/10 Pain location: bilateral knee, L>R Pain description: sharp Aggravating factors: walking, prolonged standing Relieving factors: rest   Are you having pain?  Yes: NPRS scale: 5/10 Worst: 10/10 Pain location: bilateral ankle R>L Pain description: achy Aggravating factors: walking, prolonged standing Relieving factors: rest   OBJECTIVE: (objective measures completed at initial evaluation unless otherwise dated)  DIAGNOSTIC FINDINGS:             See imaging   PATIENT SURVEYS:  FOTO: 40% function; 56% predicted   COGNITION: Overall cognitive status: Within functional limits for tasks assessed                         SENSATION: WFL   POSTURE:  large body body habitus and genu varus   PALPATION: TTP to  L knee joint line   LOWER EXTREMITY ROM:   Active ROM Right eval Left eval  Hip flexion      Hip extension      Hip abduction      Hip adduction      Hip internal rotation      Hip external rotation      Knee flexion WNL WNL  Knee extension -3 -5  Ankle dorsiflexion      Ankle plantarflexion      Ankle inversion      Ankle eversion       (Blank rows = not tested)   LOWER EXTREMITY MMT:   MMT Right eval Left eval  Hip flexion 3+/5 3+/5  Hip extension      Hip abduction 3+/5 3+/5  Hip adduction      Hip internal rotation      Hip external rotation      Knee flexion 3+/5 3+/5  Knee extension 3+/5 3+/5  Ankle dorsiflexion      Ankle plantarflexion      Ankle inversion      Ankle eversion       (Blank rows = not tested)   LOWER EXTREMITY SPECIAL TESTS:  DNT   FUNCTIONAL TESTS:  30 Second Sit to Stand: 8 reps - with UE TUG: 14" with SPC   GAIT: Distance walked: 64ft Assistive device utilized: Single point cane Level of assistance: Modified independence Comments: wide BoS; antalgic gait L   TREATMENT: [facility] Adult PT Treatment:                                                DATE: 03/27/23 Therapeutic Exercise: NuStep lvl 4 UE/LE x 8 min  LAQ 2x10 2# B alternating Seated hamstring 2x10 blue band Bil Supine SLR 2x10 Bil Supine SAQs 2x10 Bil 2# STS 2x5, arms crossed and arms extended Standing heel raise against wall 10x2 Marching against wall 10/10 Hip extension against wall 10/10   [facility] Adult PT Treatment:                                                DATE: 03/25/23 Therapeutic Exercise: NuStep lvl 4 UE/LE x 6 min  LAQ 2x12 2.5#  B alternating Seated hamstring 2x12 blue band Bil Supine SLR 2x10 Bil Supine SAQs 2x10 Bil STS 2x5, arms crossed and arms extended Standing heel raise against wall 12x2   [facility] Adult PT Treatment:                                                DATE: 03/20/2023 Therapeutic Exercise: NuStep lvl 4 UE/LE x 4 min while taking  subejctive LAQ 2x10 2.5# Seated hamstring 2x10 blue band Supine SLR 3x5 each STS 2x5 Standing hip abd/ext x 10 each Standing heel raise x 10 Gait rolled into therex amb 28600ft x 2 with sitting rest break between  [facility]                                                DATE: 03/18/2023 Therapeutic Exercise: LAQ 2x10 2.5# Seated hamstring 2x10 blue band Supine SLR 2x5 each Ankle DF/inv x 10 RTB STS 3x5 Standing hip abd 2x5 Standing heel raise x 10  [facility] Adult PT Treatment:                                                DATE: 02/27/2023 Therapeutic Exercise: Quad set x 5 - 5" hold LAQ x 5 each Seated hamstring curl x 10 GTB Seated PF with black band x 10 each   PATIENT EDUCATION:  Education details: eval findings, LEFS, HEP, POC Person educated: Patient Education method: Explanation, Demonstration, and Handouts Education comprehension: verbalized understanding and returned demonstration   HOME EXERCISE PROGRAM: Access Code: [email or web address] URL: [email or web address] Date: 02/27/2023 Prepared by: Edwinna Areolaavid Stroup   Exercises - Supine Quadricep Sets  - 1 x daily - 7 x weekly - 2 sets - 10 reps - 5 sec hold - Seated Long Arc Quad  - 1 x daily - 7 x weekly - 3 sets - 10 reps - Seated Hamstring Curl with Anchored Resistance  - 1 x daily - 7 x weekly - 3 sets - 10 reps - green band hold - Seated Ankle Plantarflexion with Resistance  - 1 x daily - 7 x weekly - 3 sets - 10 reps - black band hold   ASSESSMENT:   CLINICAL IMPRESSION: Session abbreviated as patient late for session.  Added weight to SAQs to emphasize TKE.  Increased time on Nustep to emphasize aerobic activity.    OBJECTIVE IMPAIRMENTS: Abnormal gait, decreased activity tolerance, decreased balance, decreased endurance, decreased mobility, difficulty walking, decreased strength, and pain.    ACTIVITY LIMITATIONS: sitting, standing, squatting, stairs, transfers, and locomotion level    PARTICIPATION LIMITATIONS: driving, shopping, community activity, yard work, and church   PERSONAL FACTORS: Fitness, Time since onset of injury/illness/exacerbation, and 3+ comorbidities: Depression, Asthma, ADHD   are also affecting patient's functional outcome.    GOALS: Goals reviewed with patient? No   SHORT TERM GOALS: Target date: 03/20/2023   Pt will be compliant and knowledgeable with initial HEP for improved comfort and carryover Baseline: initial HEP given  Goal status: INITIAL   2.  Pt will self report knee and ankle pain no greater than  7/10 for improved comfort and functional ability Baseline: 10/10 at worst Goal status: INITIAL    LONG TERM GOALS: Target date: 04/24/2023   Pt will improve FOTO function score to no less than 56% as proxy for functional improvement with home ADLs and community activity Baseline: 40% function Goal status: INITIAL    2.  Pt will self report knee and ankle pain no greater than 4/10 for improved comfort and functional ability Baseline: 10/10 at worst Goal status: INITIAL    3.  Pt will increase 30 Second Sit to Stand rep count to no less than 10 reps for improved balance, strength, and functional mobility Baseline: 8 reps - with UE Goal status: INITIAL    4.  Pt will improve TUG to no less than 11" with least restrictive AD for improved comfort and balance Baseline: 14" with SPC Goal status: INITIAL   PLAN:   PT FREQUENCY: 2x/week   PT DURATION: 8 weeks   PLANNED INTERVENTIONS: Therapeutic exercises, Therapeutic activity, Neuromuscular re-education, Balance training, Gait training, Patient/Family education, Self Care, Joint mobilization, Aquatic Therapy, Dry Needling, Electrical stimulation, Cryotherapy, Moist heat, Manual therapy, and Re-evaluation   PLAN FOR NEXT SESSION: assess HEP response, LE strengthening, gait training   Hildred Laser, PT 03/27/2023, 5:50 PM

## 2023-03-27 ENCOUNTER — Ambulatory Visit: Payer: Commercial Managed Care - HMO

## 2023-03-27 DIAGNOSIS — M25562 Pain in left knee: Secondary | ICD-10-CM | POA: Diagnosis not present

## 2023-03-27 DIAGNOSIS — M6281 Muscle weakness (generalized): Secondary | ICD-10-CM | POA: Diagnosis not present

## 2023-03-27 DIAGNOSIS — G8929 Other chronic pain: Secondary | ICD-10-CM | POA: Diagnosis not present

## 2023-03-27 DIAGNOSIS — M25571 Pain in right ankle and joints of right foot: Secondary | ICD-10-CM | POA: Diagnosis not present

## 2023-03-27 DIAGNOSIS — M5459 Other low back pain: Secondary | ICD-10-CM | POA: Diagnosis not present

## 2023-03-27 DIAGNOSIS — R600 Localized edema: Secondary | ICD-10-CM | POA: Diagnosis not present

## 2023-04-01 ENCOUNTER — Ambulatory Visit: Payer: Commercial Managed Care - HMO

## 2023-04-01 DIAGNOSIS — M25562 Pain in left knee: Secondary | ICD-10-CM | POA: Diagnosis not present

## 2023-04-01 DIAGNOSIS — M25571 Pain in right ankle and joints of right foot: Secondary | ICD-10-CM | POA: Diagnosis not present

## 2023-04-01 DIAGNOSIS — M6281 Muscle weakness (generalized): Secondary | ICD-10-CM

## 2023-04-01 DIAGNOSIS — M5459 Other low back pain: Secondary | ICD-10-CM | POA: Diagnosis not present

## 2023-04-01 DIAGNOSIS — G8929 Other chronic pain: Secondary | ICD-10-CM | POA: Diagnosis not present

## 2023-04-01 DIAGNOSIS — R600 Localized edema: Secondary | ICD-10-CM | POA: Diagnosis not present

## 2023-04-01 NOTE — Therapy (Signed)
OUTPATIENT PHYSICAL THERAPY TREATMENT NOTE   Patient Name: [name] MRN: [id number] DOB:07-23-1991, 32 y.o., female Today's Date: 04/01/2023  PCP: Hoy Register, MD  REFERRING PROVIDER: Persons, [place], [place]   END OF SESSION:   PT End of Session - 04/01/23 1618     Visit Number 6    Number of Visits 17    Date for PT Re-Evaluation 04/25/23    Authorization Type [employer]    PT Start Time 1615    PT Stop Time 1655    PT Time Calculation (min) 40 min    Activity Tolerance Patient tolerated treatment well;Patient limited by pain    Behavior During Therapy [facility] for tasks assessed/performed               Past Medical History:  Diagnosis Date   Acid reflux    ADHD    Asthma    Clotting disorder    Depression    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   ahi 5.5 ,mslt 07/01/06(off meds) mean latency 0 , with 4 sorems npsg 04/24/2010 2.2/hr   Exogenous obesity    Narcolepsy cataplexy syndrome 2007   Prior sleep studies starting on September 26 2005, AHI 5.3 per hour,   Poor sleep hygiene    Past Surgical History:  Procedure Laterality Date   none     Patient Active Problem List   Diagnosis Date Noted   Unilateral primary osteoarthritis, left knee 02/11/2023   Dental abscess 02/21/2022   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 prior episode 10/31/2021   OSA (obstructive sleep apnea) 10/15/2021   Depression, major, recurrent, mild 04/30/2021   Generalized anxiety disorder 04/30/2021   Migraine 06/02/2017   Intercostal pain 08/25/2016   Knee pain, bilateral 07/14/2016   Venous stasis dermatitis of both lower extremities 05/26/2016   Stye external 03/25/2016   Plantar fasciitis, right 03/25/2016   Leg swelling 11/17/2015   Headache 09/08/2015   Pain of molar 05/26/2015   Sebaceous cyst of left axilla 04/13/2015   Vitamin D deficiency 03/29/2015   Allergic rhinitis 04/11/2010   Iron deficiency anemia  03/09/2010   Esophageal reflux 03/24/2009   MENORRHAGIA 03/17/2009   Morbid obesity with body mass index of 70 and over in adult 05/15/2007   Depression 02/12/2007   Attention deficit hyperactivity disorder (ADHD), predominantly inattentive type 02/12/2007   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    REFERRING DIAG:  M25.562,G89.29 (ICD-10-CM) - Chronic pain of left knee M25.571 (ICD-10-CM) - Pain in right ankle and joints of right foot E66.01,Z68.45 (ICD-10-CM) - Morbid obesity with body mass index of 70 and over in adult [facility])  THERAPY DIAG:  Chronic pain of left knee  Muscle weakness (generalized)  Pain in right ankle and joints of right foot  Rationale for Evaluation and Treatment Rehabilitation  PERTINENT HISTORY: Depression, Asthma, ADHD   PRECAUTIONS: None   SUBJECTIVE:  SUBJECTIVE STATEMENT:  L knee pain 7/10, R ankle pain minor, only 2-3/10   PAIN:  Are you having pain?  Yes: NPRS scale: 7/10 Worst: 10/10 Pain location: bilateral knee, L>R Pain description: sharp Aggravating factors: walking, prolonged standing Relieving factors: rest   Are you having pain?  Yes: NPRS scale: 5/10 Worst: 10/10 Pain location: bilateral ankle R>L Pain description: achy Aggravating factors: walking, prolonged standing Relieving factors: rest   OBJECTIVE: (objective measures completed at initial evaluation unless otherwise dated)  DIAGNOSTIC FINDINGS:             See imaging   PATIENT SURVEYS:  FOTO: 40% function; 56% predicted; 04/01/23 34%   COGNITION: Overall cognitive status: Within functional limits for tasks assessed                         SENSATION: WFL   POSTURE:  large body body habitus and genu varus   PALPATION: TTP to L knee joint line   LOWER EXTREMITY ROM:   Active ROM  Right eval Left eval  Hip flexion      Hip extension      Hip abduction      Hip adduction      Hip internal rotation      Hip external rotation      Knee flexion WNL WNL  Knee extension -3 -5  Ankle dorsiflexion      Ankle plantarflexion      Ankle inversion      Ankle eversion       (Blank rows = not tested)   LOWER EXTREMITY MMT:   MMT Right eval Left eval  Hip flexion 3+/5 3+/5  Hip extension      Hip abduction 3+/5 3+/5  Hip adduction      Hip internal rotation      Hip external rotation      Knee flexion 3+/5 3+/5  Knee extension 3+/5 3+/5  Ankle dorsiflexion      Ankle plantarflexion      Ankle inversion      Ankle eversion       (Blank rows = not tested)   LOWER EXTREMITY SPECIAL TESTS:  DNT   FUNCTIONAL TESTS:  30 Second Sit to Stand: 8 reps - with UE TUG: 14" with SPC   GAIT: Distance walked: 64ft Assistive device utilized: Single point cane Level of assistance: Modified independence Comments: wide BoS; antalgic gait L   TREATMENT: [facility] Adult PT Treatment:                                                DATE: 04/01/23 Therapeutic Exercise: NuStep lvl 4 UE/LE x 8 min  LAQ 2x10 2# B alternating Seated hamstring 2x12 GTB Bil Supine SLR 2x12 Bil Supine SAQs 2x12 Bil 2# STS 2x5, arms extended FOTO re-take   [facility] Adult PT Treatment:                                                DATE: 03/27/23 Therapeutic Exercise: NuStep lvl 4 UE/LE x 8 min  LAQ 2x10 2# B alternating Seated hamstring 2x10 blue band Bil Supine SLR 2x10 Bil Supine SAQs 2x10 Bil 2# STS 2x5,  arms crossed and arms extended Standing heel raise against wall 10x2 Marching against wall 10/10 Hip extension against wall 10/10   [facility] Adult PT Treatment:                                                DATE: 03/25/23 Therapeutic Exercise: NuStep lvl 4 UE/LE x 6 min  LAQ 2x12 2.5# B alternating Seated hamstring 2x12 blue band Bil Supine SLR 2x10 Bil Supine SAQs 2x10 Bil STS 2x5, arms  crossed and arms extended Standing heel raise against wall 12x2   [facility] Adult PT Treatment:                                                DATE: 03/20/2023 Therapeutic Exercise: NuStep lvl 4 UE/LE x 4 min while taking subejctive LAQ 2x10 2.5# Seated hamstring 2x10 blue band Supine SLR 3x5 each STS 2x5 Standing hip abd/ext x 10 each Standing heel raise x 10 Gait rolled into therex amb 238ft x 2 with sitting rest break between  [facility] Adult PT Treatment:                                                DATE: 03/18/2023 Therapeutic Exercise: LAQ 2x10 2.5# Seated hamstring 2x10 blue band Supine SLR 2x5 each Ankle DF/inv x 10 RTB STS 3x5 Standing hip abd 2x5 Standing heel raise x 10  [facility] Adult PT Treatment:                                                DATE: 02/27/2023 Therapeutic Exercise: Quad set x 5 - 5" hold LAQ x 5 each Seated hamstring curl x 10 GTB Seated PF with black band x 10 each   PATIENT EDUCATION:  Education details: eval findings, LEFS, HEP, POC Person educated: Patient Education method: Explanation, Demonstration, and Handouts Education comprehension: verbalized understanding and returned demonstration   HOME EXERCISE PROGRAM: Access Code: [email or web address] URL: [email or web address] Date: 02/27/2023 Prepared by: Edwinna Areola   Exercises - Supine Quadricep Sets  - 1 x daily - 7 x weekly - 2 sets - 10 reps - 5 sec hold - Seated Long Arc Quad  - 1 x daily - 7 x weekly - 3 sets - 10 reps - Seated Hamstring Curl with Anchored Resistance  - 1 x daily - 7 x weekly - 3 sets - 10 reps - green band hold - Seated Ankle Plantarflexion with Resistance  - 1 x daily - 7 x weekly - 3 sets - 10 reps - black band hold   ASSESSMENT:   CLINICAL IMPRESSION: Continued with strength and stretch of B knees and ankles.  Increased reps as noted to challenge patient.  Performed Nustep w/o need of UE use.  Able to tolerate all tasks w/o aggravation of symptoms.  STGs addressed and  met   OBJECTIVE IMPAIRMENTS: Abnormal gait, decreased activity tolerance, decreased balance, decreased endurance, decreased mobility, difficulty walking, decreased strength, and pain.  ACTIVITY LIMITATIONS: sitting, standing, squatting, stairs, transfers, and locomotion level   PARTICIPATION LIMITATIONS: driving, shopping, community activity, yard work, and church   PERSONAL FACTORS: Fitness, Time since onset of injury/illness/exacerbation, and 3+ comorbidities: Depression, Asthma, ADHD   are also affecting patient's functional outcome.    GOALS: Goals reviewed with patient? No   SHORT TERM GOALS: Target date: 03/20/2023   Pt will be compliant and knowledgeable with initial HEP for improved comfort and carryover Baseline: initial HEP given  Goal status: Met   2.  Pt will self report knee and ankle pain no greater than 7/10 for improved comfort and functional ability Baseline: 10/10 at worst; 04/01/23 7/10 knee and 3/10 ankle pain  Goal status: Met   LONG TERM GOALS: Target date: 04/24/2023   Pt will improve FOTO function score to no less than 56% as proxy for functional improvement with home ADLs and community activity Baseline: 40% function; 04/01/23 34% Goal status: INITIAL    2.  Pt will self report knee and ankle pain no greater than 4/10 for improved comfort and functional ability Baseline: 10/10 at worst Goal status: INITIAL    3.  Pt will increase 30 Second Sit to Stand rep count to no less than 10 reps for improved balance, strength, and functional mobility Baseline: 8 reps - with UE Goal status: INITIAL    4.  Pt will improve TUG to no less than 11" with least restrictive AD for improved comfort and balance Baseline: 14" with SPC Goal status: INITIAL   PLAN:   PT FREQUENCY: 2x/week   PT DURATION: 8 weeks   PLANNED INTERVENTIONS: Therapeutic exercises, Therapeutic activity, Neuromuscular re-education, Balance training, Gait training, Patient/Family education, Self  Care, Joint mobilization, Aquatic Therapy, Dry Needling, Electrical stimulation, Cryotherapy, Moist heat, Manual therapy, and Re-evaluation   PLAN FOR NEXT SESSION: assess HEP response, LE strengthening, gait training   Hildred Laser, PT 04/01/2023, 4:23 PM

## 2023-04-03 ENCOUNTER — Encounter: Payer: Self-pay | Admitting: Physical Therapy

## 2023-04-03 ENCOUNTER — Ambulatory Visit: Payer: Commercial Managed Care - HMO | Admitting: Physical Therapy

## 2023-04-03 DIAGNOSIS — M5459 Other low back pain: Secondary | ICD-10-CM | POA: Diagnosis not present

## 2023-04-03 DIAGNOSIS — M6281 Muscle weakness (generalized): Secondary | ICD-10-CM

## 2023-04-03 DIAGNOSIS — G8929 Other chronic pain: Secondary | ICD-10-CM

## 2023-04-03 DIAGNOSIS — R600 Localized edema: Secondary | ICD-10-CM

## 2023-04-03 DIAGNOSIS — M25571 Pain in right ankle and joints of right foot: Secondary | ICD-10-CM

## 2023-04-03 DIAGNOSIS — M25562 Pain in left knee: Secondary | ICD-10-CM | POA: Diagnosis not present

## 2023-04-03 NOTE — Therapy (Signed)
OUTPATIENT PHYSICAL THERAPY TREATMENT NOTE   Patient Name: [name] MRN: [id number] DOB:01-11-1991, 32 y.o., female Today's Date: 04/04/2023  PCP: Hoy Register, MD  REFERRING PROVIDER: Persons, [place], [place]   END OF SESSION:   PT End of Session - 04/03/23 1606     Visit Number 7    Number of Visits 17    Date for PT Re-Evaluation 04/25/23    Authorization Type [employer]    PT Start Time 0405    PT Stop Time 0445    PT Time Calculation (min) 40 min    Activity Tolerance Patient tolerated treatment well;Patient limited by pain    Behavior During Therapy [facility] for tasks assessed/performed               Past Medical History:  Diagnosis Date   Acid reflux    ADHD    Asthma    Clotting disorder    Depression    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   ahi 5.5 ,mslt 07/01/06(off meds) mean latency 0 , with 4 sorems npsg 04/24/2010 2.2/hr   Exogenous obesity    Narcolepsy cataplexy syndrome 2007   Prior sleep studies starting on September 26 2005, AHI 5.3 per hour,   Poor sleep hygiene    Past Surgical History:  Procedure Laterality Date   none     Patient Active Problem List   Diagnosis Date Noted   Unilateral primary osteoarthritis, left knee 02/11/2023   Dental abscess 02/21/2022   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 prior episode 10/31/2021   OSA (obstructive sleep apnea) 10/15/2021   Depression, major, recurrent, mild 04/30/2021   Generalized anxiety disorder 04/30/2021   Migraine 06/02/2017   Intercostal pain 08/25/2016   Knee pain, bilateral 07/14/2016   Venous stasis dermatitis of both lower extremities 05/26/2016   Stye external 03/25/2016   Plantar fasciitis, right 03/25/2016   Leg swelling 11/17/2015   Headache 09/08/2015   Pain of molar 05/26/2015   Sebaceous cyst of left axilla 04/13/2015   Vitamin D deficiency 03/29/2015   Allergic rhinitis 04/11/2010   Iron deficiency anemia  03/09/2010   Esophageal reflux 03/24/2009   MENORRHAGIA 03/17/2009   Morbid obesity with body mass index of 70 and over in adult 05/15/2007   Depression 02/12/2007   Attention deficit hyperactivity disorder (ADHD), predominantly inattentive type 02/12/2007   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    REFERRING DIAG:  M25.562,G89.29 (ICD-10-CM) - Chronic pain of left knee M25.571 (ICD-10-CM) - Pain in right ankle and joints of right foot E66.01,Z68.45 (ICD-10-CM) - Morbid obesity with body mass index of 70 and over in adult [facility])  THERAPY DIAG:  Chronic pain of left knee  Muscle weakness (generalized)  Pain in right ankle and joints of right foot  Localized edema  Other low back pain  Rationale for Evaluation and Treatment Rehabilitation  PERTINENT HISTORY: Depression, Asthma, ADHD   PRECAUTIONS: None   SUBJECTIVE:  SUBJECTIVE STATEMENT:    Pt states her pain is high today.  He rates her knee pain at 8/10.   PAIN:  Are you having pain?  Yes: NPRS scale: 7/10 Worst: 10/10 Pain location: bilateral knee, L>R Pain description: sharp Aggravating factors: walking, prolonged standing Relieving factors: rest   Are you having pain?  Yes: NPRS scale: 5/10 Worst: 10/10 Pain location: bilateral ankle R>L Pain description: achy Aggravating factors: walking, prolonged standing Relieving factors: rest   OBJECTIVE: (objective measures completed at initial evaluation unless otherwise dated)  DIAGNOSTIC FINDINGS:             See imaging   PATIENT SURVEYS:  FOTO: 40% function; 56% predicted; 04/01/23 34%   COGNITION: Overall cognitive status: Within functional limits for tasks assessed                         SENSATION: WFL   POSTURE:  large body body habitus and genu varus    PALPATION: TTP to L knee joint line   LOWER EXTREMITY ROM:   Active ROM Right eval Left eval  Hip flexion      Hip extension      Hip abduction      Hip adduction      Hip internal rotation      Hip external rotation      Knee flexion WNL WNL  Knee extension -3 -5  Ankle dorsiflexion      Ankle plantarflexion      Ankle inversion      Ankle eversion       (Blank rows = not tested)   LOWER EXTREMITY MMT:   MMT Right eval Left eval  Hip flexion 3+/5 3+/5  Hip extension      Hip abduction 3+/5 3+/5  Hip adduction      Hip internal rotation      Hip external rotation      Knee flexion 3+/5 3+/5  Knee extension 3+/5 3+/5  Ankle dorsiflexion      Ankle plantarflexion      Ankle inversion      Ankle eversion       (Blank rows = not tested)   LOWER EXTREMITY SPECIAL TESTS:  DNT   FUNCTIONAL TESTS:  30 Second Sit to Stand: 8 reps - with UE TUG: 14" with SPC   GAIT: Distance walked: 6ft Assistive device utilized: Single point cane Level of assistance: Modified independence Comments: wide BoS; antalgic gait L   TREATMENT:  TREATMENT 04/03/23:  Aquatic therapy at [facility]- [address] - therapeutic pool temp 92 degrees Pt enters building independently.  Treatment took place in water 3.8 to  4 ft 8 in.feet deep depending upon activity.  Pt entered and exited the pool via stair and handrails    Aquatic Therapy:  Water walking for warm up fwd/lat/bkwds  Standing: Walking march - 3 laps Hamstring curl x20 BIL Hip abd/add x20 BIL Hip Circles CC/CCW 2x10 each BIL S/L heel raises - x20 Hip ext/flex with knee straight x 20 BIL Hurdle hip openers 20x ea way Yellow noodle stomp x20 ea Squats x20 Step ups fwd and lat on submerged step 2x10 BIL   Pt requires the buoyancy of water for active assisted exercises with buoyancy supported for strengthening and AROM exercises. Hydrostatic pressure also supports joints by unweighting joint load by at least  50 % in 3-4 feet depth water. 80% in chest to neck deep water. Water will provide  assistance with movement using the current and laminar flow while the buoyancy reduces weight bearing. Pt requires the viscosity of the water for resistance with strengthening exercises.    [facility] Adult PT Treatment:                                                DATE: 03/27/23 Therapeutic Exercise: NuStep lvl 4 UE/LE x 8 min  LAQ 2x10 2# B alternating Seated hamstring 2x10 blue band Bil Supine SLR 2x10 Bil Supine SAQs 2x10 Bil 2# STS 2x5, arms crossed and arms extended Standing heel raise against wall 10x2 Marching against wall 10/10 Hip extension against wall 10/10   [facility] Adult PT Treatment:                                                DATE: 03/25/23 Therapeutic Exercise: NuStep lvl 4 UE/LE x 6 min  LAQ 2x12 2.5# B alternating Seated hamstring 2x12 blue band Bil Supine SLR 2x10 Bil Supine SAQs 2x10 Bil STS 2x5, arms crossed and arms extended Standing heel raise against wall 12x2   [facility] Adult PT Treatment:                                                DATE: 03/20/2023 Therapeutic Exercise: NuStep lvl 4 UE/LE x 4 min while taking subejctive LAQ 2x10 2.5# Seated hamstring 2x10 blue band Supine SLR 3x5 each STS 2x5 Standing hip abd/ext x 10 each Standing heel raise x 10 Gait rolled into therex amb 254ft x 2 with sitting rest break between  [facility] Adult PT Treatment:                                                DATE: 03/18/2023 Therapeutic Exercise: LAQ 2x10 2.5# Seated hamstring 2x10 blue band Supine SLR 2x5 each Ankle DF/inv x 10 RTB STS 3x5 Standing hip abd 2x5 Standing heel raise x 10  [facility] Adult PT Treatment:                                                DATE: 02/27/2023 Therapeutic Exercise: Quad set x 5 - 5" hold LAQ x 5 each Seated hamstring curl x 10 GTB Seated PF with black band x 10 each   PATIENT EDUCATION:  Education details: eval findings, LEFS, HEP, POC Person educated:  Patient Education method: Explanation, Demonstration, and Handouts Education comprehension: verbalized understanding and returned demonstration   HOME EXERCISE PROGRAM: Access Code: [email or web address] URL: [email or web address] Date: 02/27/2023 Prepared by: Edwinna Areola   Exercises - Supine Quadricep Sets  - 1 x daily - 7 x weekly - 2 sets - 10 reps - 5 sec hold - Seated Long Arc Quad  - 1 x daily - 7 x weekly - 3 sets - 10 reps - Seated Hamstring Curl with Anchored Resistance  -  1 x daily - 7 x weekly - 3 sets - 10 reps - green band hold - Seated Ankle Plantarflexion with Resistance  - 1 x daily - 7 x weekly - 3 sets - 10 reps - black band hold   ASSESSMENT:   CLINICAL IMPRESSION:   Session today focused on LE strengthening and general activity tolerance in the aquatic environment for use of buoyancy to offload joints and the viscosity of water as resistance during therapeutic exercise.  Pt with fair tolerance to exercise with unchanged knee pain throughout.  Pt was visibly sleepy and only semi-engaged throughout d/t forgetting to take her medication before session.  Patient was able to tolerate all prescribed exercises in the aquatic environment with no adverse effects and reports 7/10 pain at the end of the session. Patient continues to benefit from skilled PT services on land and aquatic based and should be progressed as able to improve functional independence.    OBJECTIVE IMPAIRMENTS: Abnormal gait, decreased activity tolerance, decreased balance, decreased endurance, decreased mobility, difficulty walking, decreased strength, and pain.    ACTIVITY LIMITATIONS: sitting, standing, squatting, stairs, transfers, and locomotion level   PARTICIPATION LIMITATIONS: driving, shopping, community activity, yard work, and church   PERSONAL FACTORS: Fitness, Time since onset of injury/illness/exacerbation, and 3+ comorbidities: Depression, Asthma, ADHD   are also affecting patient's  functional outcome.    GOALS: Goals reviewed with patient? No   SHORT TERM GOALS: Target date: 03/20/2023   Pt will be compliant and knowledgeable with initial HEP for improved comfort and carryover Baseline: initial HEP given  Goal status: Met   2.  Pt will self report knee and ankle pain no greater than 7/10 for improved comfort and functional ability Baseline: 10/10 at worst; 04/01/23 7/10 knee and 3/10 ankle pain  Goal status: Met   LONG TERM GOALS: Target date: 04/24/2023   Pt will improve FOTO function score to no less than 56% as proxy for functional improvement with home ADLs and community activity Baseline: 40% function; 04/01/23 34% Goal status: INITIAL    2.  Pt will self report knee and ankle pain no greater than 4/10 for improved comfort and functional ability Baseline: 10/10 at worst Goal status: INITIAL    3.  Pt will increase 30 Second Sit to Stand rep count to no less than 10 reps for improved balance, strength, and functional mobility Baseline: 8 reps - with UE Goal status: INITIAL    4.  Pt will improve TUG to no less than 11" with least restrictive AD for improved comfort and balance Baseline: 14" with SPC Goal status: INITIAL   PLAN:   PT FREQUENCY: 2x/week   PT DURATION: 8 weeks   PLANNED INTERVENTIONS: Therapeutic exercises, Therapeutic activity, Neuromuscular re-education, Balance training, Gait training, Patient/Family education, Self Care, Joint mobilization, Aquatic Therapy, Dry Needling, Electrical stimulation, Cryotherapy, Moist heat, Manual therapy, and Re-evaluation   PLAN FOR NEXT SESSION: assess HEP response, LE strengthening, gait training   Fredderick Phenix, PT 04/04/2023, 7:28 AM

## 2023-04-07 ENCOUNTER — Other Ambulatory Visit: Payer: Self-pay | Admitting: Family Medicine

## 2023-04-07 ENCOUNTER — Other Ambulatory Visit: Payer: Self-pay

## 2023-04-07 DIAGNOSIS — K219 Gastro-esophageal reflux disease without esophagitis: Secondary | ICD-10-CM

## 2023-04-08 ENCOUNTER — Inpatient Hospital Stay: Payer: Commercial Managed Care - HMO | Admitting: Internal Medicine

## 2023-04-08 ENCOUNTER — Other Ambulatory Visit: Payer: Self-pay

## 2023-04-08 ENCOUNTER — Inpatient Hospital Stay: Payer: Commercial Managed Care - HMO | Attending: Internal Medicine

## 2023-04-08 ENCOUNTER — Ambulatory Visit: Payer: Commercial Managed Care - HMO

## 2023-04-08 MED ORDER — OMEPRAZOLE 20 MG PO CPDR
20.0000 mg | DELAYED_RELEASE_CAPSULE | Freq: Two times a day (BID) | ORAL | 0 refills | Status: DC
  Filled 2023-04-08 – 2023-04-14 (×2): qty 60, 30d supply, fill #0

## 2023-04-08 NOTE — Telephone Encounter (Signed)
Requested Prescriptions  Pending Prescriptions Disp Refills   omeprazole (PRILOSEC) 20 MG capsule 60 capsule 0    Sig: Take 1 capsule (20 mg total) by mouth 2 (two) times daily before a meal.     Gastroenterology: Proton Pump Inhibitors Passed - 04/07/2023  2:07 PM      Passed - Valid encounter within last 12 months    Recent Outpatient Visits           2 months ago Pain in both hands   [facility] [place], [place], [place]   4 months ago Annual physical exam   [facility] [place], Odette Horns, MD   8 months ago Prediabetes   [facility] [place], Marzella Schlein, [place]   1 year ago Dental abscess   [facility], Gildardo Pounds, NP   1 year ago Microcytic anemia   [place] [facility] Hoy Register, MD       Future Appointments             In 1 month Hoy Register, MD [facility]

## 2023-04-10 ENCOUNTER — Ambulatory Visit: Payer: Commercial Managed Care - HMO | Admitting: Physical Therapy

## 2023-04-14 ENCOUNTER — Other Ambulatory Visit: Payer: Self-pay

## 2023-04-14 ENCOUNTER — Other Ambulatory Visit (HOSPITAL_COMMUNITY): Payer: Self-pay

## 2023-04-15 ENCOUNTER — Ambulatory Visit: Payer: Commercial Managed Care - HMO

## 2023-04-17 ENCOUNTER — Ambulatory Visit: Payer: Commercial Managed Care - HMO | Attending: Family Medicine | Admitting: Physical Therapy

## 2023-04-17 ENCOUNTER — Encounter: Payer: Self-pay | Admitting: Physical Therapy

## 2023-04-17 DIAGNOSIS — M25571 Pain in right ankle and joints of right foot: Secondary | ICD-10-CM | POA: Diagnosis not present

## 2023-04-17 DIAGNOSIS — M6281 Muscle weakness (generalized): Secondary | ICD-10-CM | POA: Diagnosis not present

## 2023-04-17 DIAGNOSIS — M25562 Pain in left knee: Secondary | ICD-10-CM | POA: Diagnosis not present

## 2023-04-17 DIAGNOSIS — R600 Localized edema: Secondary | ICD-10-CM | POA: Diagnosis not present

## 2023-04-17 DIAGNOSIS — G8929 Other chronic pain: Secondary | ICD-10-CM | POA: Diagnosis not present

## 2023-04-17 NOTE — Therapy (Signed)
OUTPATIENT PHYSICAL THERAPY TREATMENT NOTE   Patient Name: [name] MRN: [id number] DOB:04-Oct-1991, 32 y.o., female Today's Date: 04/18/2023  PCP: Hoy Register, MD  REFERRING PROVIDER: Persons, [place], [place]   END OF SESSION:   PT End of Session - 04/17/23 1625     Visit Number 8    Number of Visits 17    Date for PT Re-Evaluation 04/25/23    Authorization Type [employer]    PT Start Time 0420    PT Stop Time 0500    PT Time Calculation (min) 40 min    Activity Tolerance Patient tolerated treatment well;Patient limited by pain    Behavior During Therapy [facility] for tasks assessed/performed                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   ahi 5.5 ,mslt 07/01/06(off meds) mean latency 0 , with 4 sorems npsg 04/24/2010 2.2/hr   Exogenous obesity    Narcolepsy cataplexy syndrome 2007   Prior sleep studies starting on September 26 2005, AHI 5.3 per hour,   Poor sleep hygiene    Past Surgical History:  Procedure Laterality Date   none     Patient Active Problem List   Diagnosis Date Noted   Unilateral primary osteoarthritis, left knee 02/11/2023   Dental abscess 02/21/2022   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 prior episode (HCC) 10/31/2021   OSA (obstructive sleep apnea) 10/15/2021   Depression, major, recurrent, mild (HCC) 04/30/2021   Generalized anxiety disorder 04/30/2021   Migraine 06/02/2017   Intercostal pain 08/25/2016   Knee pain, bilateral 07/14/2016   Venous stasis dermatitis of both lower extremities 05/26/2016   Stye external 03/25/2016   Plantar fasciitis, right 03/25/2016   Leg swelling 11/17/2015   Headache 09/08/2015   Pain of molar 05/26/2015   Sebaceous cyst of left axilla 04/13/2015   Vitamin D deficiency 03/29/2015   Allergic rhinitis 04/11/2010   Iron  deficiency anemia 03/09/2010   Esophageal reflux 03/24/2009   MENORRHAGIA 03/17/2009   Morbid obesity with body mass index of 70 and over in adult [facility]) 05/15/2007   Depression 02/12/2007   Attention deficit hyperactivity disorder (ADHD), predominantly inattentive type 02/12/2007   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    REFERRING DIAG:  M25.562,G89.29 (ICD-10-CM) - Chronic pain of left knee M25.571 (ICD-10-CM) - Pain in right ankle and joints of right foot E66.01,Z68.45 (ICD-10-CM) - Morbid obesity with body mass index of 70 and over in adult [facility])  THERAPY DIAG:  Chronic pain of left knee  Muscle weakness (generalized)  Pain in right ankle and joints of right foot  Localized edema  Rationale for Evaluation and Treatment Rehabilitation  PERTINENT HISTORY: Depression, Asthma, ADHD   PRECAUTIONS: None   SUBJECTIVE:  SUBJECTIVE STATEMENT:    Pt states she is doing ok today.  She rates her knee pain at 7/10.   PAIN:  Are you having pain?  Yes: NPRS scale: 7/10 Worst: 10/10 Pain location: bilateral knee, L>R Pain description: sharp Aggravating factors: walking, prolonged standing Relieving factors: rest   Are you having pain?  Yes: NPRS scale: 5/10 Worst: 10/10 Pain location: bilateral ankle R>L Pain description: achy Aggravating factors: walking, prolonged standing Relieving factors: rest   OBJECTIVE: (objective measures completed at initial evaluation unless otherwise dated)  DIAGNOSTIC FINDINGS:             See imaging   PATIENT SURVEYS:  FOTO: 40% function; 56% predicted; 04/01/23 34%   COGNITION: Overall cognitive status: Within functional limits for tasks assessed                         SENSATION: WFL   POSTURE:  large body body habitus and genu varus    PALPATION: TTP to L knee joint line   LOWER EXTREMITY ROM:   Active ROM Right eval Left eval  Hip flexion      Hip extension      Hip abduction      Hip adduction      Hip internal rotation      Hip external rotation      Knee flexion WNL WNL  Knee extension -3 -5  Ankle dorsiflexion      Ankle plantarflexion      Ankle inversion      Ankle eversion       (Blank rows = not tested)   LOWER EXTREMITY MMT:   MMT Right eval Left eval  Hip flexion 3+/5 3+/5  Hip extension      Hip abduction 3+/5 3+/5  Hip adduction      Hip internal rotation      Hip external rotation      Knee flexion 3+/5 3+/5  Knee extension 3+/5 3+/5  Ankle dorsiflexion      Ankle plantarflexion      Ankle inversion      Ankle eversion       (Blank rows = not tested)   LOWER EXTREMITY SPECIAL TESTS:  DNT   FUNCTIONAL TESTS:  30 Second Sit to Stand: 8 reps - with UE TUG: 14" with SPC   GAIT: Distance walked: 45ft Assistive device utilized: Single point cane Level of assistance: Modified independence Comments: wide BoS; antalgic gait L   TREATMENT:  TREATMENT 04/18/23:  Aquatic therapy at [facility]- [address] - therapeutic pool temp 92 degrees Pt enters building independently.  Treatment took place in water 3.8 to  4 ft 8 in.feet deep depending upon activity.  Pt entered and exited the pool via stair and handrails    Aquatic Therapy:  Water walking for warm up fwd/lat/bkwds  Standing: Walking march - 3 laps Hamstring curl 2x20 BIL Hip abd/add 2x20 BIL Jogging with yellow dumbell punches Lateral walking with shoulder adduction yellow DB  Hip Circles CC/CCW 2x10 each BIL S/L heel raises - x20 Hurdle hip openers 20x ea way Yellow noodle stomp x20 ea Squats 2x20 Step ups fwd and lat on submerged step 2x10 BIL   Pt requires the buoyancy of water for active assisted exercises with buoyancy supported for strengthening and AROM exercises. Hydrostatic pressure also  supports joints by unweighting joint load by at least 50 % in 3-4 feet depth water. 80% in chest to neck  deep water. Water will provide assistance with movement using the current and laminar flow while the buoyancy reduces weight bearing. Pt requires the viscosity of the water for resistance with strengthening exercises.    [facility] Adult PT Treatment:                                                DATE: 03/27/23 Therapeutic Exercise: NuStep lvl 4 UE/LE x 8 min  LAQ 2x10 2# B alternating Seated hamstring 2x10 blue band Bil Supine SLR 2x10 Bil Supine SAQs 2x10 Bil 2# STS 2x5, arms crossed and arms extended Standing heel raise against wall 10x2 Marching against wall 10/10 Hip extension against wall 10/10   [facility] Adult PT Treatment:                                                DATE: 03/25/23 Therapeutic Exercise: NuStep lvl 4 UE/LE x 6 min  LAQ 2x12 2.5# B alternating Seated hamstring 2x12 blue band Bil Supine SLR 2x10 Bil Supine SAQs 2x10 Bil STS 2x5, arms crossed and arms extended Standing heel raise against wall 12x2   [facility] Adult PT Treatment:                                                DATE: 03/20/2023 Therapeutic Exercise: NuStep lvl 4 UE/LE x 4 min while taking subejctive LAQ 2x10 2.5# Seated hamstring 2x10 blue band Supine SLR 3x5 each STS 2x5 Standing hip abd/ext x 10 each Standing heel raise x 10 Gait rolled into therex amb 244ft x 2 with sitting rest break between  [facility] Adult PT Treatment:                                                DATE: 03/18/2023 Therapeutic Exercise: LAQ 2x10 2.5# Seated hamstring 2x10 blue band Supine SLR 2x5 each Ankle DF/inv x 10 RTB STS 3x5 Standing hip abd 2x5 Standing heel raise x 10  [facility] Adult PT Treatment:                                                DATE: 02/27/2023 Therapeutic Exercise: Quad set x 5 - 5" hold LAQ x 5 each Seated hamstring curl x 10 GTB Seated PF with black band x 10 each   PATIENT EDUCATION:  Education  details: eval findings, LEFS, HEP, POC Person educated: Patient Education method: Explanation, Demonstration, and Handouts Education comprehension: verbalized understanding and returned demonstration   HOME EXERCISE PROGRAM: Access Code: [email or web address] URL: [email or web address] Date: 02/27/2023 Prepared by: Edwinna Areola   Exercises - Supine Quadricep Sets  - 1 x daily - 7 x weekly - 2 sets - 10 reps - 5 sec hold - Seated Long Arc Quad  - 1 x daily - 7 x weekly - 3 sets - 10 reps - Seated  Hamstring Curl with Anchored Resistance  - 1 x daily - 7 x weekly - 3 sets - 10 reps - green band hold - Seated Ankle Plantarflexion with Resistance  - 1 x daily - 7 x weekly - 3 sets - 10 reps - black band hold   ASSESSMENT:   CLINICAL IMPRESSION:   Session today focused on LE strengthening and general activity tolerance in the aquatic environment for use of buoyancy to offload joints and the viscosity of water as resistance during therapeutic exercise.  [place] with significantly more energy and engagement in exercise today.  She showed improved endurance compared to last session.  She is able to complete exercise in aquatic environment with minimal pain.  Unclear how much carryover there is to functional activities at this point.  Discussed lifestyle changes including diet and sleep; pt somewhat receptive.  Patient was able to tolerate all prescribed exercises in the aquatic environment with no adverse effects and reports 5/10 pain at the end of the session. Patient continues to benefit from skilled PT services on land and aquatic based and should be progressed as able to improve functional independence.    OBJECTIVE IMPAIRMENTS: Abnormal gait, decreased activity tolerance, decreased balance, decreased endurance, decreased mobility, difficulty walking, decreased strength, and pain.    ACTIVITY LIMITATIONS: sitting, standing, squatting, stairs, transfers, and locomotion level   PARTICIPATION  LIMITATIONS: driving, shopping, community activity, yard work, and church   PERSONAL FACTORS: Fitness, Time since onset of injury/illness/exacerbation, and 3+ comorbidities: Depression, Asthma, ADHD   are also affecting patient's functional outcome.    GOALS: Goals reviewed with patient? No   SHORT TERM GOALS: Target date: 03/20/2023   Pt will be compliant and knowledgeable with initial HEP for improved comfort and carryover Baseline: initial HEP given  Goal status: Met   2.  Pt will self report knee and ankle pain no greater than 7/10 for improved comfort and functional ability Baseline: 10/10 at worst; 04/01/23 7/10 knee and 3/10 ankle pain  Goal status: Met   LONG TERM GOALS: Target date: 04/24/2023   Pt will improve FOTO function score to no less than 56% as proxy for functional improvement with home ADLs and community activity Baseline: 40% function; 04/01/23 34% Goal status: INITIAL    2.  Pt will self report knee and ankle pain no greater than 4/10 for improved comfort and functional ability Baseline: 10/10 at worst Goal status: INITIAL    3.  Pt will increase 30 Second Sit to Stand rep count to no less than 10 reps for improved balance, strength, and functional mobility Baseline: 8 reps - with UE Goal status: INITIAL    4.  Pt will improve TUG to no less than 11" with least restrictive AD for improved comfort and balance Baseline: 14" with SPC Goal status: INITIAL   PLAN:   PT FREQUENCY: 2x/week   PT DURATION: 8 weeks   PLANNED INTERVENTIONS: Therapeutic exercises, Therapeutic activity, Neuromuscular re-education, Balance training, Gait training, Patient/Family education, Self Care, Joint mobilization, Aquatic Therapy, Dry Needling, Electrical stimulation, Cryotherapy, Moist heat, Manual therapy, and Re-evaluation   PLAN FOR NEXT SESSION: assess HEP response, LE strengthening, gait training   Fredderick Phenix, PT 04/18/2023, 7:57 AM

## 2023-04-22 ENCOUNTER — Ambulatory Visit: Payer: Commercial Managed Care - HMO

## 2023-04-22 DIAGNOSIS — G8929 Other chronic pain: Secondary | ICD-10-CM

## 2023-04-22 DIAGNOSIS — M6281 Muscle weakness (generalized): Secondary | ICD-10-CM

## 2023-04-22 DIAGNOSIS — M25571 Pain in right ankle and joints of right foot: Secondary | ICD-10-CM | POA: Diagnosis not present

## 2023-04-22 DIAGNOSIS — R600 Localized edema: Secondary | ICD-10-CM | POA: Diagnosis not present

## 2023-04-22 DIAGNOSIS — M25562 Pain in left knee: Secondary | ICD-10-CM | POA: Diagnosis not present

## 2023-04-22 NOTE — Therapy (Signed)
OUTPATIENT PHYSICAL THERAPY TREATMENT NOTE   Patient Name: [name] MRN: [id number] DOB:1991/04/11, 32 y.o., female Today's Date: 04/23/2023  PCP: Hoy Register, MD  REFERRING PROVIDER: Persons, [place], [place]   END OF SESSION:   PT End of Session - 04/22/23 1700     Visit Number 9    Number of Visits 17    Date for PT Re-Evaluation 04/25/23    Authorization Type [employer]    PT Start Time 1700    PT Stop Time 1742    PT Time Calculation (min) 42 min    Activity Tolerance Patient tolerated treatment well;Patient limited by pain    Behavior During Therapy [facility] for tasks assessed/performed                 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   ahi 5.5 ,mslt 07/01/06(off meds) mean latency 0 , with 4 sorems npsg 04/24/2010 2.2/hr   Exogenous obesity    Narcolepsy cataplexy syndrome 2007   Prior sleep studies starting on September 26 2005, AHI 5.3 per hour,   Poor sleep hygiene    Past Surgical History:  Procedure Laterality Date   none     Patient Active Problem List   Diagnosis Date Noted   Unilateral primary osteoarthritis, left knee 02/11/2023   Dental abscess 02/21/2022   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 prior episode (HCC) 10/31/2021   OSA (obstructive sleep apnea) 10/15/2021   Depression, major, recurrent, mild (HCC) 04/30/2021   Generalized anxiety disorder 04/30/2021   Migraine 06/02/2017   Intercostal pain 08/25/2016   Knee pain, bilateral 07/14/2016   Venous stasis dermatitis of both lower extremities 05/26/2016   Stye external 03/25/2016   Plantar fasciitis, right 03/25/2016   Leg swelling 11/17/2015   Headache 09/08/2015   Pain of molar 05/26/2015   Sebaceous cyst of left axilla 04/13/2015   Vitamin D deficiency 03/29/2015   Allergic rhinitis 04/11/2010   Iron  deficiency anemia 03/09/2010   Esophageal reflux 03/24/2009   MENORRHAGIA 03/17/2009   Morbid obesity with body mass index of 70 and over in adult [facility]) 05/15/2007   Depression 02/12/2007   Attention deficit hyperactivity disorder (ADHD), predominantly inattentive type 02/12/2007   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  She rates her knee pain at 7/10. Has been doing HEP.    PAIN:  Are you having pain?  Yes: NPRS scale: 7/10 Worst: 10/10 Pain location: bilateral knee, L>R Pain description: sharp Aggravating factors: walking, prolonged standing Relieving factors: rest   Are you having pain?  Yes: NPRS scale: 5/10 Worst: 10/10 Pain location: bilateral ankle R>L Pain description: achy Aggravating factors: walking, prolonged standing Relieving factors: rest   OBJECTIVE: (objective measures completed at initial evaluation unless otherwise dated)  DIAGNOSTIC FINDINGS:             See imaging   PATIENT SURVEYS:  FOTO: 40% function; 56% predicted; 04/01/23 34%   COGNITION: Overall cognitive status: Within functional limits for tasks assessed                         SENSATION: WFL   POSTURE:  large body body habitus and genu varus   PALPATION: TTP to L knee joint line   LOWER EXTREMITY  ROM:   Active ROM Right eval Left eval  Hip flexion      Hip extension      Hip abduction      Hip adduction      Hip internal rotation      Hip external rotation      Knee flexion WNL WNL  Knee extension -3 -5  Ankle dorsiflexion      Ankle plantarflexion      Ankle inversion      Ankle eversion       (Blank rows = not tested)   LOWER EXTREMITY MMT:   MMT Right eval Left eval  Hip flexion 3+/5 3+/5  Hip extension      Hip abduction 3+/5 3+/5  Hip adduction      Hip internal rotation      Hip external rotation      Knee flexion 3+/5 3+/5  Knee extension 3+/5 3+/5  Ankle dorsiflexion      Ankle plantarflexion      Ankle inversion      Ankle eversion       (Blank rows = not tested)   LOWER EXTREMITY SPECIAL TESTS:  DNT   FUNCTIONAL TESTS:  30 Second Sit to Stand: 8 reps - with UE TUG: 14" with SPC   GAIT: Distance walked: 83ft Assistive device utilized: Single point cane Level of assistance: Modified independence Comments: wide BoS; antalgic gait L   TREATMENT: [facility] Adult PT Treatment:                                                DATE: 04/22/23 Therapeutic Exercise: NuStep lvl 4 UE/LE x 8 min  LAQ 2x10 2# B alternating Seated hamstring 2x10 blue band Bil Supine SLR 2x10 Bil Bridge 2x10 STS 3x5, arms crossed and arms extended Standing heel/toe raise against wall 2x10 Lateral walk at counter x 2 laps Standing hip abd 2x10 each  TREATMENT 04/18/23:  Aquatic therapy at [facility]- [address] - therapeutic pool temp 92 degrees Pt enters building independently.  Treatment took place in water 3.8 to  4 ft 8 in.feet deep depending upon activity.  Pt entered and exited the pool via stair and handrails    Aquatic Therapy:  Water walking for  warm up fwd/lat/bkwds  Standing: Walking march - 3 laps Hamstring curl 2x20 BIL Hip abd/add 2x20 BIL Jogging with yellow dumbell punches Lateral walking with shoulder adduction yellow DB  Hip Circles  CC/CCW 2x10 each BIL S/L heel raises - x20 Hurdle hip openers 20x ea way Yellow noodle stomp x20 ea Squats 2x20 Step ups fwd and lat on submerged step 2x10 BIL   Pt requires the buoyancy of water for active assisted exercises with buoyancy supported for strengthening and AROM exercises. Hydrostatic pressure also supports joints by unweighting joint load by at least 50 % in 3-4 feet depth water. 80% in chest to neck deep water. Water will provide assistance with movement using the current and laminar flow while the buoyancy reduces weight bearing. Pt requires the viscosity of the water for resistance with strengthening exercises.    [facility] Adult PT Treatment:                                                DATE: 03/27/23 Therapeutic Exercise: NuStep lvl 4 UE/LE x 8 min  LAQ 2x10 2# B alternating Seated hamstring 2x10 blue band Bil Supine SLR 2x10 Bil Supine SAQs 2x10 Bil 2# STS 2x5, arms crossed and arms extended Standing heel raise against wall 10x2 Marching against wall 10/10 Hip extension against wall 10/10   [facility] Adult PT Treatment:                                                DATE: 03/25/23 Therapeutic Exercise: NuStep lvl 4 UE/LE x 6 min  LAQ 2x12 2.5# B alternating Seated hamstring 2x12 blue band Bil Supine SLR 2x10 Bil Supine SAQs 2x10 Bil STS 2x5, arms crossed and arms extended Standing heel raise against wall 12x2   [facility] Adult PT Treatment:                                                DATE: 03/20/2023 Therapeutic Exercise: NuStep lvl 4 UE/LE x 4 min while taking subejctive LAQ 2x10 2.5# Seated hamstring 2x10 blue band Supine SLR 3x5 each STS 2x5 Standing hip abd/ext x 10 each Standing heel raise x 10 Gait rolled into therex amb 29ft x 2 with sitting rest break between  [facility] Adult PT Treatment:                                                DATE: 03/18/2023 Therapeutic Exercise: LAQ 2x10 2.5# Seated hamstring 2x10 blue band Supine SLR 2x5 each Ankle DF/inv x 10  RTB STS 3x5 Standing hip abd 2x5 Standing heel raise x 10  [facility] Adult PT Treatment:                                                DATE: 02/27/2023 Therapeutic Exercise: Quad set x 5 - 5" hold  LAQ x 5 each Seated hamstring curl x 10 GTB Seated PF with black band x 10 each   PATIENT EDUCATION:  Education details: eval findings, LEFS, HEP, POC Person educated: Patient Education method: Explanation, Demonstration, and Handouts Education comprehension: verbalized understanding and returned demonstration   HOME EXERCISE PROGRAM: Access Code: [email or web address] URL: [email or web address] Date: 02/27/2023 Prepared by: Edwinna Areola   Exercises - Supine Quadricep Sets  - 1 x daily - 7 x weekly - 2 sets - 10 reps - 5 sec hold - Seated Long Arc Quad  - 1 x daily - 7 x weekly - 3 sets - 10 reps - Seated Hamstring Curl with Anchored Resistance  - 1 x daily - 7 x weekly - 3 sets - 10 reps - green band hold - Seated Ankle Plantarflexion with Resistance  - 1 x daily - 7 x weekly - 3 sets - 10 reps - black band hold   ASSESSMENT:   CLINICAL IMPRESSION:  Pt tolerated treatment fair but continues to be limited by weakness and complex medical hx. She was able to tolerate treatment better today, with increased repetitions and difficulty of exercises.Therapy worked on improving LE strength and functional activity tolerance. Will continue to progress exercises in standing per POC.    OBJECTIVE IMPAIRMENTS: Abnormal gait, decreased activity tolerance, decreased balance, decreased endurance, decreased mobility, difficulty walking, decreased strength, and pain.    ACTIVITY LIMITATIONS: sitting, standing, squatting, stairs, transfers, and locomotion level   PARTICIPATION LIMITATIONS: driving, shopping, community activity, yard work, and church   PERSONAL FACTORS: Fitness, Time since onset of injury/illness/exacerbation, and 3+ comorbidities: Depression, Asthma, ADHD   are also affecting patient's  functional outcome.    GOALS: Goals reviewed with patient? No   SHORT TERM GOALS: Target date: 03/20/2023   Pt will be compliant and knowledgeable with initial HEP for improved comfort and carryover Baseline: initial HEP given  Goal status: Met   2.  Pt will self report knee and ankle pain no greater than 7/10 for improved comfort and functional ability Baseline: 10/10 at worst; 04/01/23 7/10 knee and 3/10 ankle pain  Goal status: Met   LONG TERM GOALS: Target date: 04/24/2023   Pt will improve FOTO function score to no less than 56% as proxy for functional improvement with home ADLs and community activity Baseline: 40% function; 04/01/23 34% Goal status: INITIAL    2.  Pt will self report knee and ankle pain no greater than 4/10 for improved comfort and functional ability Baseline: 10/10 at worst Goal status: INITIAL    3.  Pt will increase 30 Second Sit to Stand rep count to no less than 10 reps for improved balance, strength, and functional mobility Baseline: 8 reps - with UE Goal status: INITIAL    4.  Pt will improve TUG to no less than 11" with least restrictive AD for improved comfort and balance Baseline: 14" with SPC Goal status: INITIAL   PLAN:   PT FREQUENCY: 2x/week   PT DURATION: 8 weeks   PLANNED INTERVENTIONS: Therapeutic exercises, Therapeutic activity, Neuromuscular re-education, Balance training, Gait training, Patient/Family education, Self Care, Joint mobilization, Aquatic Therapy, Dry Needling, Electrical stimulation, Cryotherapy, Moist heat, Manual therapy, and Re-evaluation   PLAN FOR NEXT SESSION: assess HEP response, LE strengthening, gait training   Eloy End, PT 04/23/2023, 9:23 AM

## 2023-04-24 ENCOUNTER — Ambulatory Visit: Payer: Commercial Managed Care - HMO | Admitting: Physical Therapy

## 2023-04-25 ENCOUNTER — Encounter (HOSPITAL_COMMUNITY): Payer: Self-pay | Admitting: Psychiatry

## 2023-04-25 ENCOUNTER — Ambulatory Visit (INDEPENDENT_AMBULATORY_CARE_PROVIDER_SITE_OTHER): Payer: Commercial Managed Care - HMO | Admitting: Psychiatry

## 2023-04-25 ENCOUNTER — Other Ambulatory Visit: Payer: Self-pay

## 2023-04-25 DIAGNOSIS — F900 Attention-deficit hyperactivity disorder, predominantly inattentive type: Secondary | ICD-10-CM | POA: Diagnosis not present

## 2023-04-25 DIAGNOSIS — F411 Generalized anxiety disorder: Secondary | ICD-10-CM

## 2023-04-25 DIAGNOSIS — F330 Major depressive disorder, recurrent, mild: Secondary | ICD-10-CM

## 2023-04-25 MED ORDER — BUPROPION HCL ER (XL) 150 MG PO TB24
150.0000 mg | ORAL_TABLET | ORAL | 3 refills | Status: DC
  Filled 2023-04-25 – 2023-05-01 (×2): qty 30, 30d supply, fill #0

## 2023-04-25 MED ORDER — HYDROXYZINE HCL 10 MG PO TABS
10.0000 mg | ORAL_TABLET | Freq: Three times a day (TID) | ORAL | 3 refills | Status: DC | PRN
  Filled 2023-04-25 – 2023-05-01 (×2): qty 90, 30d supply, fill #0
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
## 2023-04-29 ENCOUNTER — Other Ambulatory Visit: Payer: Self-pay

## 2023-04-29 ENCOUNTER — Encounter (HOSPITAL_COMMUNITY): Payer: Self-pay | Admitting: Emergency Medicine

## 2023-04-29 ENCOUNTER — Emergency Department (HOSPITAL_COMMUNITY): Payer: Commercial Managed Care - HMO

## 2023-04-29 ENCOUNTER — Emergency Department (HOSPITAL_COMMUNITY)
Admission: EM | Admit: 2023-04-29 | Discharge: 2023-04-30 | Disposition: A | Discharge status: Home / Self Care | Payer: Commercial Managed Care - HMO | Attending: Student | Admitting: Student

## 2023-04-29 DIAGNOSIS — R1013 Epigastric pain: Secondary | ICD-10-CM | POA: Diagnosis not present

## 2023-04-29 DIAGNOSIS — J45909 Unspecified asthma, uncomplicated: Secondary | ICD-10-CM | POA: Diagnosis not present

## 2023-04-29 DIAGNOSIS — R109 Unspecified abdominal pain: Secondary | ICD-10-CM | POA: Diagnosis not present

## 2023-04-29 DIAGNOSIS — Z87891 Personal history of nicotine dependence: Secondary | ICD-10-CM | POA: Insufficient documentation

## 2023-04-29 DIAGNOSIS — R079 Chest pain, unspecified: Secondary | ICD-10-CM | POA: Diagnosis not present

## 2023-04-29 DIAGNOSIS — R0789 Other chest pain: Secondary | ICD-10-CM | POA: Diagnosis not present

## 2023-04-29 DIAGNOSIS — R0602 Shortness of breath: Secondary | ICD-10-CM | POA: Diagnosis not present

## 2023-04-29 DIAGNOSIS — D509 Iron deficiency anemia, unspecified: Secondary | ICD-10-CM | POA: Insufficient documentation

## 2023-04-29 LAB — I-STAT BETA HCG BLOOD, ED (MC, WL, AP ONLY): I-stat hCG, quantitative: <5 m[IU]/mL (ref <5)

## 2023-04-29 LAB — CBC
HCT: 29.6 % — ABNORMAL LOW (ref 36.0–46.0)
Hemoglobin: 8 g/dL — ABNORMAL LOW (ref 12.0–15.0)
MCH: 15.9 pg — ABNORMAL LOW (ref 26.0–34.0)
MCHC: 27 g/dL — ABNORMAL LOW (ref 30.0–36.0)
MCV: 58.8 fL — ABNORMAL LOW (ref 80.0–100.0)
Platelets: 438 10*3/uL — ABNORMAL HIGH (ref 150–400)
RBC: 5.03 MIL/uL (ref 3.87–5.11)
RDW: 23.4 % — ABNORMAL HIGH (ref 11.5–15.5)
WBC: 10.1 10*3/uL (ref 4.0–10.5)
nRBC: 0 % (ref 0.0–0.2)

## 2023-04-29 LAB — BASIC METABOLIC PANEL
Anion gap: 8 (ref 5–15)
BUN: 9 mg/dL (ref 6–20)
CO2: 23 mmol/L (ref 22–32)
Calcium: 8.7 mg/dL — ABNORMAL LOW (ref 8.9–10.3)
Chloride: 105 mmol/L (ref 98–111)
Creatinine, Ser: 0.61 mg/dL (ref 0.44–1.00)
GFR, Estimated: >60 mL/min (ref >60)
Glucose, Bld: 112 mg/dL — ABNORMAL HIGH (ref 70–99)
Potassium: 4 mmol/L (ref 3.5–5.1)
Sodium: 136 mmol/L (ref 135–145)

## 2023-04-29 LAB — TYPE AND SCREEN

## 2023-04-29 LAB — TROPONIN I (HIGH SENSITIVITY): Troponin I (High Sensitivity): 3 ng/L (ref <18)

## 2023-04-29 NOTE — ED Triage Notes (Signed)
Patient c/o shob and chest pain that started 2 weeks ago.  Patient has history of anemia and reports that she was supposed to get an iron transfusion but was unable to get one.  Patient also has a history of blood transfusions.

## 2023-04-30 ENCOUNTER — Emergency Department (HOSPITAL_COMMUNITY): Payer: Commercial Managed Care - HMO

## 2023-04-30 ENCOUNTER — Other Ambulatory Visit: Payer: Self-pay

## 2023-04-30 DIAGNOSIS — R109 Unspecified abdominal pain: Secondary | ICD-10-CM | POA: Diagnosis not present

## 2023-04-30 DIAGNOSIS — R1013 Epigastric pain: Secondary | ICD-10-CM | POA: Diagnosis not present

## 2023-04-30 LAB — TYPE AND SCREEN
ABO/RH(D): O POS
Unit division: 0

## 2023-04-30 LAB — HEPATIC FUNCTION PANEL
ALT: 15 U/L (ref 0–44)
AST: 13 U/L — ABNORMAL LOW (ref 15–41)
Albumin: 2.8 g/dL — ABNORMAL LOW (ref 3.5–5.0)
Alkaline Phosphatase: 93 U/L (ref 38–126)
Bilirubin, Direct: <0.1 mg/dL (ref 0.0–0.2)
Total Bilirubin: 0.5 mg/dL (ref 0.3–1.2)
Total Protein: 6.7 g/dL (ref 6.5–8.1)

## 2023-04-30 LAB — BPAM RBC: Blood Product Expiration Date: 202406222359

## 2023-04-30 LAB — LIPASE, BLOOD: Lipase: 21 U/L (ref 11–51)

## 2023-04-30 LAB — TROPONIN I (HIGH SENSITIVITY): Troponin I (High Sensitivity): 3 ng/L (ref <18)

## 2023-04-30 LAB — D-DIMER, QUANTITATIVE: D-Dimer, Quant: 0.31 ug/mL-FEU (ref 0.00–0.50)

## 2023-04-30 MED ORDER — IOHEXOL 350 MG/ML SOLN
100.0000 mL | Freq: Once | INTRAVENOUS | Status: AC | PRN
  Administered 2023-04-30: 100 mL via INTRAVENOUS

## 2023-04-30 MED ORDER — ONDANSETRON 4 MG PO TBDP
8.0000 mg | ORAL_TABLET | Freq: Once | ORAL | Status: AC
  Administered 2023-04-30: 8 mg via ORAL
  Filled 2023-04-30: qty 2

## 2023-04-30 MED ORDER — FAMOTIDINE IN NACL 20-0.9 MG/50ML-% IV SOLN
20.0000 mg | Freq: Once | INTRAVENOUS | Status: AC
  Administered 2023-04-30: 20 mg via INTRAVENOUS
  Filled 2023-04-30: qty 50

## 2023-04-30 MED ORDER — LIDOCAINE VISCOUS HCL 2 % MT SOLN
15.0000 mL | Freq: Once | OROMUCOSAL | Status: AC
  Administered 2023-04-30: 15 mL via ORAL
  Filled 2023-04-30: qty 15

## 2023-04-30 MED ORDER — ALUM & MAG HYDROXIDE-SIMETH 200-200-20 MG/5ML PO SUSP
30.0000 mL | Freq: Once | ORAL | Status: AC
  Administered 2023-04-30: 30 mL via ORAL
  Filled 2023-04-30: qty 30

## 2023-04-30 MED ORDER — PROCHLORPERAZINE MALEATE 10 MG PO TABS
10.0000 mg | ORAL_TABLET | Freq: Two times a day (BID) | ORAL | 0 refills | Status: DC | PRN
  Filled 2023-04-30 – 2023-05-01 (×2): qty 10, 5d supply, fill #0

## 2023-04-30 MED ORDER — FAMOTIDINE 20 MG PO TABS
20.0000 mg | ORAL_TABLET | Freq: Every day | ORAL | 0 refills | Status: DC
  Filled 2023-04-30 – 2023-05-01 (×2): qty 30, 30d supply, fill #0

## 2023-04-30 NOTE — ED Notes (Signed)
PO challenge started

## 2023-04-30 NOTE — ED Provider Notes (Addendum)
  Physical Exam  BP (!) 110/57   Pulse 100   Temp 98.9 F (37.2 C) (Oral)   Resp 17   Ht 5\' 3"  (1.6 m)   Wt (!) 193 kg   SpO2 97%   BMI 75.37 kg/m   Physical Exam  Procedures  Procedures  ED Course / MDM    Medical Decision Making Amount and/or Complexity of Data Reviewed Labs: ordered. Radiology: ordered.  Risk OTC drugs. Prescription drug management.   26F presenting with chest pain, abdominal pain, initially improved with a GI cocktail. Pending dimer, CT abdomen Pelvis. Hgb anemic but improved compared to baseline.   CT Abdomen Pelvis: IMPRESSION:  No acute abnormality identified in the abdomen or pelvis.    Labs: CBC with a hemoglobin of 8.0 improved from prior measurements, microcytic, BMP unremarkable, D-dimer negative, hCG negative, troponins negative, lipase normal, hepatic function panel unremarkable.  Patient feeling symptomatically improved following IV Pepcid, was notably tolerating oral intake, feeling better after Maalox and lidocaine as well.  Symptoms are consistent with likely GERD/gastritis.  Additionally patient experiencing mild symptoms of anemia however hemoglobin is not low enough to warrant blood transfusion and is actually improved from prior measurements.  I recommended the patient follow-up with her PCP regarding her low hemoglobin for continued recheck, return precautions provided.  Stable for discharge.  Epigastric pain and chest pains consistent with likely GERD gastritis and will prescribe Pepcid and Compazine for symptom management.  Advised PCP follow-up.     Ernie Avena, MD 04/30/23 1110    Ernie Avena, MD 04/30/23 1112

## 2023-04-30 NOTE — Discharge Instructions (Addendum)
Please follow-up with your PCP with continued recheck of your hemoglobin.  Your blood counts were actually improved compared to prior measurements.  You could be exhibiting mild symptoms of anemia.  Return for any severe worsening of your symptoms.  You epigastric discomfort and chest discomfort could be due to gastritis and/or GERD.  Will start you on Pepcid as well.  Will prescribe Compazine for nausea.

## 2023-04-30 NOTE — ED Notes (Signed)
Patient transported to CT 

## 2023-04-30 NOTE — ED Provider Notes (Signed)
[place] EMERGENCY DEPARTMENT AT [facility] Provider Note  CSN: [id number] Arrival date & time: 04/29/23 2237  Chief Complaint(s) Shortness of Breath and Chest Pain  HPI [name] is a 32 y.o. female with PMH obesity, narcolepsy cataplexy syndrome, iron deficiency anemia with intermittent iron transfusions, GERD who presents emergency department for evaluation of multiple complaints including excessive fatigue, shortness of breath, abdominal pain and chest pain.  She states that she started to feel generally ill approximately 3 weeks ago during her menstrual cycle.  She states that her menses finished and she had some mild improvement of her symptoms but her symptoms then returned approximately 1 week later and she has been feeling ill over the course of this week.  She endorses epigastric abdominal pain radiating up into the chest with a generalized feeling of malaise and shortness of breath.  She denies nausea, vomiting, headache, fever or other systemic symptoms.  She states that she presents to the Emergency Department because she is concerned that she may need a transfusion.   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   ahi 5.5 ,mslt 07/01/06(off meds) mean latency 0 , with 4 sorems npsg 04/24/2010 2.2/hr   Exogenous obesity    Narcolepsy cataplexy syndrome 2007   Prior sleep studies starting on September 26 2005, AHI 5.3 per hour,   Poor sleep hygiene    Patient Active Problem List   Diagnosis Date Noted   Unilateral primary osteoarthritis, left knee 02/11/2023   Dental abscess 02/21/2022   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 prior episode (HCC) 10/31/2021   OSA (obstructive sleep apnea) 10/15/2021   Depression, major, recurrent, mild (HCC) 04/30/2021   Generalized anxiety  disorder 04/30/2021   Migraine 06/02/2017   Intercostal pain 08/25/2016   Knee pain, bilateral 07/14/2016   Venous stasis dermatitis of both lower extremities 05/26/2016   Stye external 03/25/2016   Plantar fasciitis, right 03/25/2016   Leg swelling 11/17/2015   Headache 09/08/2015   Pain of molar 05/26/2015   Sebaceous cyst of left axilla 04/13/2015   Vitamin D deficiency 03/29/2015   Allergic rhinitis 04/11/2010   Iron deficiency anemia 03/09/2010   Esophageal reflux 03/24/2009   MENORRHAGIA 03/17/2009   Morbid obesity with body mass index of 70 and over in adult [facility]) 05/15/2007   Depression 02/12/2007   Attention deficit hyperactivity disorder (ADHD), predominantly inattentive type 02/12/2007   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   Home Medication(s) Prior to Admission medications   Medication Sig Start Date End Date Taking? Authorizing Provider  famotidine (PEPCID) 20 MG tablet Take 1 tablet (20 mg total) by mouth daily. 04/30/23  Yes Ernie Avena, MD  prochlorperazine (COMPAZINE) 10 MG tablet Take 1 tablet (10 mg total) by mouth 2 (two) times daily as needed for nausea or vomiting. 04/30/23  Yes Ernie Avena, MD  acetaminophen (TYLENOL) 500 MG tablet Take 1,000 mg by mouth every 6 (six) hours as needed for mild pain. Patient not taking: Reported on 02/11/2023    [provider]  amphetamine-dextroamphetamine (ADDERALL) 20 MG tablet Take 1 tablet (20 mg total) by mouth daily. 08/21/22   Shanna Cisco, NP  buPROPion (WELLBUTRIN XL) 150 MG 24 hr tablet Take 1 tablet (150 mg total) by mouth every morning. 04/25/23   Toy Cookey  E, NP  cetirizine (ZYRTEC) 10 MG tablet Take 1 tablet (10 mg total) by mouth daily. 02/18/23   Hoy Register, MD  cyclobenzaprine (FLEXERIL) 10 MG tablet Take 0.5 tablets (5 mg total) by mouth 3 (three) times daily as needed for muscle spasms. 01/29/23   Anders Simmonds, PA-C  ferrous sulfate 325 (65 FE) MG tablet Take 1 tablet (325 mg  total) by mouth daily. 01/29/23   Anders Simmonds, PA-C  gabapentin (NEURONTIN) 300 MG capsule Take 1 capsule (300 mg total) by mouth 3 (three) times daily. 08/07/22   Hoy Register, MD  hydrOXYzine (ATARAX) 10 MG tablet Take 1 tablet (10 mg total) by mouth 3 (three) times daily as needed. 04/25/23   Shanna Cisco, NP  meloxicam (MOBIC) 7.5 MG tablet Take 1 tablet (7.5 mg total) by mouth daily. 08/01/22   Anders Simmonds, PA-C  omeprazole (PRILOSEC) 20 MG capsule Take 1 capsule (20 mg total) by mouth 2 (two) times daily before a meal. 04/08/23   Hoy Register, MD  topiramate (TOPAMAX) 50 MG tablet Take 1 tablet (50 mg total) by mouth 2 (two) times daily. 08/01/22   Anders Simmonds, PA-C  tranexamic acid (LYSTEDA) 650 MG TABS tablet Take 2 tablets (1,300 mg total) by mouth 3 (three) times daily. Take during menses for a maximum of five days 09/25/22   Lorriane Shire, MD  Vitamin D, Ergocalciferol, (DRISDOL) 1.25 MG (50000 UNIT) CAPS capsule Take 1 capsule (50,000 Units total) by mouth every 7 (seven) days. 01/30/23   Anders Simmonds, PA-C                                                                                                                                    Past Surgical History Past Surgical History:  Procedure Laterality Date   none     Family History Family History  Problem Relation Age of Onset   Hypertension Mother    Asthma Mother    Alcohol abuse Father    Asthma Brother    Alcohol abuse Maternal Grandmother     Social History Social History   Tobacco Use   Smoking status: Light Smoker    Types: Cigarettes   Smokeless tobacco: Former    Quit date: 04/2022   Tobacco comments:    2 cigarettes/ day  Vaping Use   Vaping Use: Never used  Substance Use Topics   Alcohol use: Yes    Comment: ocassional   Drug use: Yes    Types: Marijuana   Allergies Banana and Food  Review of Systems Review of Systems  Constitutional:  Positive for fatigue.   Respiratory:  Positive for shortness of breath.   Cardiovascular:  Positive for chest pain.  Gastrointestinal:  Positive for abdominal pain.    Physical Exam Vital Signs  I have reviewed the triage vital signs BP 100/78 (BP Location: Right Arm)   Pulse 90  Temp 98 F (36.7 C) (Oral)   Resp 15   Ht 5\' 3"  (1.6 m)   Wt (!) 193 kg   SpO2 99%   BMI 75.37 kg/m   Physical Exam Vitals and nursing note reviewed.  Constitutional:      General: She is not in acute distress.    Appearance: She is well-developed.  HENT:     Head: Normocephalic and atraumatic.  Eyes:     Conjunctiva/sclera: Conjunctivae normal.  Cardiovascular:     Rate and Rhythm: Normal rate and regular rhythm.     Heart sounds: No murmur heard. Pulmonary:     Effort: Pulmonary effort is normal. No respiratory distress.     Breath sounds: Normal breath sounds.  Abdominal:     Palpations: Abdomen is soft.     Tenderness: There is abdominal tenderness.  Musculoskeletal:        General: No swelling.     Cervical back: Neck supple.  Skin:    General: Skin is warm and dry.     Capillary Refill: Capillary refill takes less than 2 seconds.  Neurological:     Mental Status: She is alert.  Psychiatric:        Mood and Affect: Mood normal.     ED Results and Treatments Labs (all labs ordered are listed, but only abnormal results are displayed) Labs Reviewed  BASIC METABOLIC PANEL - Abnormal; Notable for the following components:      Result Value   Glucose, Bld 112 (*)    Calcium 8.7 (*)    All other components within normal limits  CBC - Abnormal; Notable for the following components:   Hemoglobin 8.0 (*)    HCT 29.6 (*)    MCV 58.8 (*)    MCH 15.9 (*)    MCHC 27.0 (*)    RDW 23.4 (*)    Platelets 438 (*)    All other components within normal limits  HEPATIC FUNCTION PANEL - Abnormal; Notable for the following components:   Albumin 2.8 (*)    AST 13 (*)    All other components within normal limits   D-DIMER, QUANTITATIVE  LIPASE, BLOOD  I-STAT BETA HCG BLOOD, ED ([facility], [facility], [facility] ONLY)  I-STAT BETA HCG BLOOD, ED ([facility], [facility], [facility] ONLY)  TYPE AND SCREEN  TROPONIN I (HIGH SENSITIVITY)  TROPONIN I (HIGH SENSITIVITY)                                                                                                                          Radiology CT ABDOMEN PELVIS W CONTRAST  Result Date: 04/30/2023 CLINICAL DATA:  Epigastric pain. EXAM: CT ABDOMEN AND PELVIS WITH CONTRAST TECHNIQUE: Multidetector CT imaging of the abdomen and pelvis was performed using the standard protocol following bolus administration of intravenous contrast. RADIATION DOSE REDUCTION: This exam was performed according to the departmental dose-optimization program which includes automated exposure control, adjustment of the mA and/or kV according to patient size and/or use  of iterative reconstruction technique. CONTRAST:  OMNIPAQUE IOHEXOL 350 MG/ML SOLN COMPARISON:  None Available. FINDINGS: Lower chest: No acute abnormality. Hepatobiliary: No focal liver abnormality is seen. No gallstones, gallbladder wall thickening, or biliary dilatation. Pancreas: Unremarkable. Spleen: Unremarkable. Adrenals/Urinary Tract: Unremarkable adrenal glands. No evidence of a renal mass, calculi, or hydronephrosis. Unremarkable bladder. Stomach/Bowel: The stomach is unremarkable. There is no evidence of bowel obstruction or inflammation. The appendix is unremarkable. Vascular/Lymphatic: Normal caliber of the abdominal aorta. No enlarged lymph nodes. Reproductive: Uterus and bilateral adnexa are unremarkable. Other: No ascites or pneumoperitoneum. Musculoskeletal: No acute osseous abnormality or suspicious osseous lesion. IMPRESSION: No acute abnormality identified in the abdomen or pelvis. Electronically Signed   By: Sebastian Ache M.D.   On: 04/30/2023 09:33   DG Chest 2 View  Result Date: 04/29/2023 CLINICAL DATA:  Chest pain and shortness of breath.  EXAM: CHEST - 2 VIEW COMPARISON:  None Available. FINDINGS: The heart size and mediastinal contours are within normal limits. Both lungs are clear. The visualized skeletal structures are unremarkable. IMPRESSION: No active cardiopulmonary disease. Electronically Signed   By: Aram Candela M.D.   On: 04/29/2023 23:17    Pertinent labs & imaging results that were available during my care of the patient were reviewed by me and considered in my medical decision making (see MDM for details).  Medications Ordered in ED Medications  alum & mag hydroxide-simeth (MAALOX/MYLANTA) 200-200-20 MG/5ML suspension 30 mL (30 mLs Oral Given 04/30/23 0330)    And  lidocaine (XYLOCAINE) 2 % viscous mouth solution 15 mL (15 mLs Oral Given 04/30/23 0330)  ondansetron (ZOFRAN-ODT) disintegrating tablet 8 mg (8 mg Oral Given 04/30/23 0515)  famotidine (PEPCID) IVPB 20 mg premix (0 mg Intravenous Stopped 04/30/23 0815)  iohexol (OMNIPAQUE) 350 MG/ML injection 100 mL (100 mLs Intravenous Contrast Given 04/30/23 0901)                                                                                                                                     Procedures Procedures  (including critical care time)  Medical Decision Making / ED Course   This patient presents to the ED for concern of abdominal pain, chest pain, shortness of breath, fatigue, this involves an extensive number of treatment options, and is a complaint that carries with it a high risk of complications and morbidity.  The differential diagnosis includes symptomatic anemia, GERD, gastritis, small bowel obstruction, cholecystitis, ACS, PE, pneumonia  MDM: Patient seen emergency room for evaluation of multiple complaints described above.  Physical exam with tenderness in the epigastrium but is otherwise unremarkable.  Laboratory evaluation with a hemoglobin of 8.0, MCV 58.8 which is actually improved from baseline for this patient.  D-dimer negative,  high-sensitivity troponin and delta troponin negative.  Chest x-ray unremarkable.  Patient initially had some improvement with GI cocktail but on my reevaluation she was endorsing persistent nausea and return of her  epigastric abdominal pain.  At time of signout, patient pending reevaluation after Pepcid administration and CT abdomen pelvis.  Please see provider signout for continuation of workup.  Anticipate discharge.   Additional history obtained:  -External records from outside source obtained and reviewed including: Chart review including previous notes, labs, imaging, consultation notes   Lab Tests: -I ordered, reviewed, and interpreted labs.   The pertinent results include:   Labs Reviewed  BASIC METABOLIC PANEL - Abnormal; Notable for the following components:      Result Value   Glucose, Bld 112 (*)    Calcium 8.7 (*)    All other components within normal limits  CBC - Abnormal; Notable for the following components:   Hemoglobin 8.0 (*)    HCT 29.6 (*)    MCV 58.8 (*)    MCH 15.9 (*)    MCHC 27.0 (*)    RDW 23.4 (*)    Platelets 438 (*)    All other components within normal limits  HEPATIC FUNCTION PANEL - Abnormal; Notable for the following components:   Albumin 2.8 (*)    AST 13 (*)    All other components within normal limits  D-DIMER, QUANTITATIVE  LIPASE, BLOOD  I-STAT BETA HCG BLOOD, ED ([facility], [facility], [facility] ONLY)  I-STAT BETA HCG BLOOD, ED ([facility], [facility], [facility] ONLY)  TYPE AND SCREEN  TROPONIN I (HIGH SENSITIVITY)  TROPONIN I (HIGH SENSITIVITY)      EKG   EKG Interpretation  Date/Time:  Tuesday Apr 29 2023 22:49:49 EDT Ventricular Rate:  109 PR Interval:  142 QRS Duration: 80 QT Interval:  324 QTC Calculation: 436 R Axis:   70 Text Interpretation: Sinus tachycardia Otherwise normal ECG No significant change since last tracing Confirmed by Melene Plan [phone]) on 04/30/2023 6:52:38 AM         Imaging Studies ordered: I ordered imaging studies including chest x-ray I  independently visualized and interpreted imaging. I agree with the radiologist interpretation  CTAP ordered and is pending   Medicines ordered and prescription drug management: Meds ordered this encounter  Medications   AND Linked Order Group    alum & mag hydroxide-simeth (MAALOX/MYLANTA) 200-200-20 MG/5ML suspension 30 mL    lidocaine (XYLOCAINE) 2 % viscous mouth solution 15 mL   ondansetron (ZOFRAN-ODT) disintegrating tablet 8 mg   famotidine (PEPCID) IVPB 20 mg premix   iohexol (OMNIPAQUE) 350 MG/ML injection 100 mL   famotidine (PEPCID) 20 MG tablet    Sig: Take 1 tablet (20 mg total) by mouth daily.    Dispense:  30 tablet    Refill:  0   prochlorperazine (COMPAZINE) 10 MG tablet    Sig: Take 1 tablet (10 mg total) by mouth 2 (two) times daily as needed for nausea or vomiting.    Dispense:  10 tablet    Refill:  0    -I have reviewed the patients home medicines and have made adjustments as needed  Critical interventions none    Cardiac Monitoring: The patient was maintained on a cardiac monitor.  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  Please see provider signout for continuation of workup     Final Clinical Impression(s) / ED Diagnoses Final diagnoses:  Epigastric pain  Chest pain, unspecified type  Microcytic anemia     @PCDICTATION @    Glendora Score, MD 04/30/23 1714

## 2023-05-01 ENCOUNTER — Other Ambulatory Visit: Payer: Self-pay

## 2023-05-01 ENCOUNTER — Other Ambulatory Visit (HOSPITAL_COMMUNITY): Payer: Self-pay

## 2023-05-03 LAB — TYPE AND SCREEN
Antibody Screen: POSITIVE
Donor AG Type: NEGATIVE
Donor AG Type: NEGATIVE
PT AG Type: NEGATIVE
Unit division: 0

## 2023-05-03 LAB — BPAM RBC
Blood Product Expiration Date: 202406222359
Unit Type and Rh: 5100
Unit Type and Rh: 5100

## 2023-05-05 ENCOUNTER — Ambulatory Visit: Payer: Commercial Managed Care - HMO

## 2023-05-05 DIAGNOSIS — M25562 Pain in left knee: Secondary | ICD-10-CM | POA: Diagnosis not present

## 2023-05-05 DIAGNOSIS — M25571 Pain in right ankle and joints of right foot: Secondary | ICD-10-CM | POA: Diagnosis not present

## 2023-05-05 DIAGNOSIS — G8929 Other chronic pain: Secondary | ICD-10-CM

## 2023-05-05 DIAGNOSIS — R600 Localized edema: Secondary | ICD-10-CM | POA: Diagnosis not present

## 2023-05-05 DIAGNOSIS — M6281 Muscle weakness (generalized): Secondary | ICD-10-CM

## 2023-05-05 NOTE — Therapy (Addendum)
OUTPATIENT PHYSICAL THERAPY TREATMENT NOTE   Patient Name: [name] MRN: [id number] DOB:09-Oct-1991, 32 y.o., female Today's Date: 05/05/2023  PCP: Hoy Register, MD  REFERRING PROVIDER: Persons, [place], [place]   END OF SESSION:   PT End of Session - 05/05/23 1657     Visit Number 10    Number of Visits 17    Date for PT Re-Evaluation 04/25/23    Authorization Type [employer]    Authorization Time Period FOTO v6, v10    PT Start Time 1656    PT Stop Time 1734    PT Time Calculation (min) 38 min    Activity Tolerance Patient tolerated treatment well;Patient limited by pain    Behavior During Therapy [facility] for tasks assessed/performed                  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   ahi 5.5 ,mslt 07/01/06(off meds) mean latency 0 , with 4 sorems npsg 04/24/2010 2.2/hr   Exogenous obesity    Narcolepsy cataplexy syndrome 2007   Prior sleep studies starting on September 26 2005, AHI 5.3 per hour,   Poor sleep hygiene    Past Surgical History:  Procedure Laterality Date   none     Patient Active Problem List   Diagnosis Date Noted   Unilateral primary osteoarthritis, left knee 02/11/2023   Dental abscess 02/21/2022   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 prior episode (HCC) 10/31/2021   OSA (obstructive sleep apnea) 10/15/2021   Depression, major, recurrent, mild (HCC) 04/30/2021   Generalized anxiety disorder 04/30/2021   Migraine 06/02/2017   Intercostal pain 08/25/2016   Knee pain, bilateral 07/14/2016   Venous stasis dermatitis of both lower extremities 05/26/2016   Stye external 03/25/2016   Plantar fasciitis, right 03/25/2016   Leg swelling 11/17/2015   Headache 09/08/2015   Pain of molar 05/26/2015   Sebaceous cyst of left axilla 04/13/2015   Vitamin D deficiency  03/29/2015   Allergic rhinitis 04/11/2010   Iron deficiency anemia 03/09/2010   Esophageal reflux 03/24/2009   MENORRHAGIA 03/17/2009   Morbid obesity with body mass index of 70 and over in adult [facility]) 05/15/2007   Depression 02/12/2007   Attention deficit hyperactivity disorder (ADHD), predominantly inattentive type 02/12/2007   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    REFERRING DIAG:  M25.562,G89.29 (ICD-10-CM) - Chronic pain of left knee M25.571 (ICD-10-CM) - Pain in right ankle and joints of right foot E66.01,Z68.45 (ICD-10-CM) - Morbid obesity with body mass index of 70 and over in adult [facility])  THERAPY DIAG:  Chronic pain of left knee  Muscle weakness (generalized)  Pain in right ankle and joints of right foot  Rationale for Evaluation and Treatment Rehabilitation  PERTINENT HISTORY: Depression, Asthma, ADHD   PRECAUTIONS: None   SUBJECTIVE:  SUBJECTIVE STATEMENT:  Reports 7/10 L knee pain, 8/10 R ankle pain   PAIN:  Are you having pain?  Yes: NPRS scale: 7/10 Worst: 10/10 Pain location: bilateral knee, L>R Pain description: sharp Aggravating factors: walking, prolonged standing Relieving factors: rest   Are you having pain?  Yes: NPRS scale: 5/10 Worst: 10/10 Pain location: bilateral ankle R>L Pain description: achy Aggravating factors: walking, prolonged standing Relieving factors: rest   OBJECTIVE: (objective measures completed at initial evaluation unless otherwise dated)  DIAGNOSTIC FINDINGS:             See imaging   PATIENT SURVEYS:  FOTO: 40% function; 56% predicted; 04/01/23 34%; 05/05/23 36%   COGNITION: Overall cognitive status: Within functional limits for tasks assessed                         SENSATION: WFL   POSTURE:  large body body habitus and genu  varus   PALPATION: TTP to L knee joint line   LOWER EXTREMITY ROM:   Active ROM Right eval Left eval R 05/05/23 L 05/05/23  Hip flexion        Hip extension        Hip abduction        Hip adduction        Hip internal rotation        Hip external rotation        Knee flexion WNL WNL [facility] WFL  Knee extension -3 -5 WFL WFL  Ankle dorsiflexion        Ankle plantarflexion        Ankle inversion        Ankle eversion         (Blank rows = not tested)   LOWER EXTREMITY MMT:   MMT Right eval Left eval R  05/05/23 L 05/05/23  Hip flexion 3+/5 3+/5 4-/5 4-/5  Hip extension        Hip abduction 3+/5 3+/5 4-/5 4-/5  Hip adduction        Hip internal rotation        Hip external rotation        Knee flexion 3+/5 3+/5 4-/5 4-/5  Knee extension 3+/5 3+/5 4-/5 4-/5  Ankle dorsiflexion        Ankle plantarflexion        Ankle inversion        Ankle eversion         (Blank rows = not tested)   LOWER EXTREMITY SPECIAL TESTS:  DNT   FUNCTIONAL TESTS:  30 Second Sit to Stand: 8 reps - with UE 05/05/23 9 stands w/o need of UE assist TUG: 14" with [facility] 05/05/23 19s with SPC   GAIT: Distance walked: 86ft Assistive device utilized: Single point cane Level of assistance: Modified independence Comments: wide BoS; antalgic gait L   TREATMENT: [facility] Adult PT Treatment:                                                DATE: 05/05/23 Therapeutic Exercise: NuStep lvl 4 UE/LE x 8 min  LAQ 2x10 3# B alternating Seated hamstring 2x10 blue band Bil Supine SLR 2x10 Bil Bridge 2x10 Standing heel/toe raise against wall 2x10  Therapeutic Activity: TUG 19s with SPC 30s chair stand test 9 reps w/o UE assist  [facility] Adult  PT Treatment:                                                DATE: 04/22/23 Therapeutic Exercise: NuStep lvl 4 UE/LE x 8 min  LAQ 2x10 2# B alternating Seated hamstring 2x10 blue band Bil Supine SLR 2x10 Bil Bridge 2x10 STS 3x5, arms crossed and arms extended Standing  heel/toe raise against wall 2x10 Lateral walk at counter x 2 laps Standing hip abd 2x10 each  TREATMENT 04/18/23:  Aquatic therapy at [facility]- [address] - therapeutic pool temp 92 degrees Pt enters building independently.  Treatment took place in water 3.8 to  4 ft 8 in.feet deep depending upon activity.  Pt entered and exited the pool via stair and handrails    Aquatic Therapy:  Water walking for warm up fwd/lat/bkwds  Standing: Walking march - 3 laps Hamstring curl 2x20 BIL Hip abd/add 2x20 BIL Jogging with yellow dumbell punches Lateral walking with shoulder adduction yellow DB  Hip Circles CC/CCW 2x10 each BIL S/L heel raises - x20 Hurdle hip openers 20x ea way Yellow noodle stomp x20 ea Squats 2x20 Step ups fwd and lat on submerged step 2x10 BIL   Pt requires the buoyancy of water for active assisted exercises with buoyancy supported for strengthening and AROM exercises. Hydrostatic pressure also supports joints by unweighting joint load by at least 50 % in 3-4 feet depth water. 80% in chest to neck deep water. Water will provide assistance with movement using the current and laminar flow while the buoyancy reduces weight bearing. Pt requires the viscosity of the water for resistance with strengthening exercises.    [facility] Adult PT Treatment:                                                DATE: 03/27/23 Therapeutic Exercise: NuStep lvl 4 UE/LE x 8 min  LAQ 2x10 2# B alternating Seated hamstring 2x10 blue band Bil Supine SLR 2x10 Bil Supine SAQs 2x10 Bil 2# STS 2x5, arms crossed and arms extended Standing heel raise against wall 10x2 Marching against wall 10/10 Hip extension against wall 10/10   [facility] Adult PT Treatment:                                                DATE: 03/25/23 Therapeutic Exercise: NuStep lvl 4 UE/LE x 6 min  LAQ 2x12 2.5# B alternating Seated hamstring 2x12 blue band Bil Supine SLR 2x10 Bil Supine SAQs 2x10 Bil STS 2x5, arms crossed  and arms extended Standing heel raise against wall 12x2   [facility] Adult PT Treatment:                                                DATE: 03/20/2023 Therapeutic Exercise: NuStep lvl 4 UE/LE x 4 min while taking subejctive LAQ 2x10 2.5# Seated hamstring 2x10 blue band Supine SLR 3x5 each STS 2x5 Standing hip abd/ext x 10 each Standing heel raise x  10 Gait rolled into therex amb 229ft x 2 with sitting rest break between  [facility] Adult PT Treatment:                                                DATE: 03/18/2023 Therapeutic Exercise: LAQ 2x10 2.5# Seated hamstring 2x10 blue band Supine SLR 2x5 each Ankle DF/inv x 10 RTB STS 3x5 Standing hip abd 2x5 Standing heel raise x 10  [facility] Adult PT Treatment:                                                DATE: 02/27/2023 Therapeutic Exercise: Quad set x 5 - 5" hold LAQ x 5 each Seated hamstring curl x 10 GTB Seated PF with black band x 10 each   PATIENT EDUCATION:  Education details: eval findings, LEFS, HEP, POC Person educated: Patient Education method: Explanation, Demonstration, and Handouts Education comprehension: verbalized understanding and returned demonstration   HOME EXERCISE PROGRAM: Access Code: [email or web address] URL: [email or web address] Date: 02/27/2023 Prepared by: Edwinna Areola   Exercises - Supine Quadricep Sets  - 1 x daily - 7 x weekly - 2 sets - 10 reps - 5 sec hold - Seated Long Arc Quad  - 1 x daily - 7 x weekly - 3 sets - 10 reps - Seated Hamstring Curl with Anchored Resistance  - 1 x daily - 7 x weekly - 3 sets - 10 reps - green band hold - Seated Ankle Plantarflexion with Resistance  - 1 x daily - 7 x weekly - 3 sets - 10 reps - black band hold   ASSESSMENT:   CLINICAL IMPRESSION: Goal progress assessed today with gains noted in 30s chair stand test increasing to 9 reps w/o need of UE assist.  Pain levels still high and pain goals not met in either knee or ankle.  TUG time have increased and FOTO score has  remained relatively unchanged.  BLE strength gains noted.  Patient appears to be close to maximum rehab potential  Recommend continue with POC and anticipate DC at end of estimated visit count    OBJECTIVE IMPAIRMENTS: Abnormal gait, decreased activity tolerance, decreased balance, decreased endurance, decreased mobility, difficulty walking, decreased strength, and pain.    ACTIVITY LIMITATIONS: sitting, standing, squatting, stairs, transfers, and locomotion level   PARTICIPATION LIMITATIONS: driving, shopping, community activity, yard work, and church   PERSONAL FACTORS: Fitness, Time since onset of injury/illness/exacerbation, and 3+ comorbidities: Depression, Asthma, ADHD   are also affecting patient's functional outcome.    GOALS: Goals reviewed with patient? No   SHORT TERM GOALS: Target date: 03/20/2023   Pt will be compliant and knowledgeable with initial HEP for improved comfort and carryover Baseline: initial HEP given  Goal status: Met   2.  Pt will self report knee and ankle pain no greater than 7/10 for improved comfort and functional ability Baseline: 10/10 at worst; 04/01/23 7/10 knee and 3/10 ankle pain  Goal status: Met   LONG TERM GOALS: Target date: 07/05/2023   Pt will improve FOTO function score to no less than 56% as proxy for functional improvement with home ADLs and community activity Baseline: 40% function; 04/01/23 34%; 05/05/23 36%  Goal status: Ongoing   2.  Pt will self report knee and ankle pain no greater than 4/10 for improved comfort and functional ability Baseline: 10/10 at worst; 05/05/23 L knee pain averaging 7/10 Goal status: Ongoing   3.  Pt will increase 30 Second Sit to Stand rep count to no less than 10 reps for improved balance, strength, and functional mobility Baseline: 8 reps - with UE; 05/05/23 9 reps w/o UE assist Goal status: Progressing   4.  Pt will improve TUG to no less than 11" with least restrictive AD for improved comfort and  balance Baseline: 14" with SPC; 05/05/23 19s with SPC Goal status: Ongoing   PLAN:   PT FREQUENCY: 2x/week   PT DURATION: 3 weeks   PLANNED INTERVENTIONS: Therapeutic exercises, Therapeutic activity, Neuromuscular re-education, Balance training, Gait training, Patient/Family education, Self Care, Joint mobilization, Aquatic Therapy, Dry Needling, Electrical stimulation, Cryotherapy, Moist heat, Manual therapy, and Re-evaluation   PLAN FOR NEXT SESSION: assess HEP response, LE strengthening, gait training   Hildred Laser, PT 05/05/2023, 5:45 PM

## 2023-05-06 ENCOUNTER — Ambulatory Visit: Payer: Commercial Managed Care - HMO

## 2023-05-07 ENCOUNTER — Ambulatory Visit: Payer: Commercial Managed Care - HMO | Admitting: Physical Therapy

## 2023-05-13 ENCOUNTER — Encounter: Payer: Self-pay | Admitting: Family Medicine

## 2023-05-13 ENCOUNTER — Ambulatory Visit: Payer: Commercial Managed Care - HMO

## 2023-05-13 ENCOUNTER — Ambulatory Visit: Payer: Commercial Managed Care - HMO | Attending: Family Medicine | Admitting: Family Medicine

## 2023-05-13 ENCOUNTER — Other Ambulatory Visit: Payer: Self-pay

## 2023-05-13 VITALS — BP 124/81 | HR 75 | Ht 63.0 in | Wt >= 6400 oz

## 2023-05-13 DIAGNOSIS — M25571 Pain in right ankle and joints of right foot: Secondary | ICD-10-CM

## 2023-05-13 DIAGNOSIS — M6281 Muscle weakness (generalized): Secondary | ICD-10-CM | POA: Diagnosis not present

## 2023-05-13 DIAGNOSIS — Z131 Encounter for screening for diabetes mellitus: Secondary | ICD-10-CM

## 2023-05-13 DIAGNOSIS — K219 Gastro-esophageal reflux disease without esophagitis: Secondary | ICD-10-CM | POA: Diagnosis not present

## 2023-05-13 DIAGNOSIS — M25562 Pain in left knee: Secondary | ICD-10-CM | POA: Diagnosis not present

## 2023-05-13 DIAGNOSIS — N920 Excessive and frequent menstruation with regular cycle: Secondary | ICD-10-CM

## 2023-05-13 DIAGNOSIS — Z6841 Body Mass Index (BMI) 40.0 and over, adult: Secondary | ICD-10-CM

## 2023-05-13 DIAGNOSIS — R600 Localized edema: Secondary | ICD-10-CM | POA: Diagnosis not present

## 2023-05-13 DIAGNOSIS — G47419 Narcolepsy without cataplexy: Secondary | ICD-10-CM | POA: Diagnosis not present

## 2023-05-13 DIAGNOSIS — G8929 Other chronic pain: Secondary | ICD-10-CM

## 2023-05-13 MED ORDER — OMEPRAZOLE 40 MG PO CPDR
40.0000 mg | DELAYED_RELEASE_CAPSULE | Freq: Every day | ORAL | 1 refills | Status: DC
Start: 2023-05-13 — End: 2024-01-07
  Filled 2023-05-13 – 2023-06-03 (×3): qty 30, 30d supply, fill #0
  Filled 2023-07-17: qty 30, 30d supply, fill #1
  Filled 2023-09-02: qty 30, 30d supply, fill #2
  Filled 2023-09-02: qty 90, 90d supply, fill #0
  Filled 2023-12-15: qty 30, 30d supply, fill #1
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
## 2023-05-13 NOTE — Therapy (Signed)
OUTPATIENT PHYSICAL THERAPY TREATMENT NOTE   Patient Name: [name] MRN: [id number] DOB:[birth year], 32 y.o., female Today's Date: 05/13/2023  PCP: Hoy Register, MD  REFERRING PROVIDER: Persons, [place], [place]   END OF SESSION:   PT End of Session - 05/13/23 1711     Visit Number 11    Number of Visits 17    Date for PT Re-Evaluation 07/05/23    Authorization Type C[employer]    Authorization Time Period FOTO v6, v10    PT Start Time 1710    PT Stop Time 1750    PT Time Calculation (min) 40 min    Activity Tolerance Patient tolerated treatment well;Patient limited by pain    Behavior During Therapy [facility] for tasks assessed/performed                  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   ahi 5.5 ,mslt 07/01/06(off meds) mean latency 0 , with 4 sorems npsg 04/24/2010 2.2/hr   Exogenous obesity    Narcolepsy cataplexy syndrome 2007   Prior sleep studies starting on September 26 2005, AHI 5.3 per hour,   Poor sleep hygiene    Past Surgical History:  Procedure Laterality Date   none     Patient Active Problem List   Diagnosis Date Noted   Unilateral primary osteoarthritis, left knee 02/11/2023   Dental abscess 02/21/2022   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 prior episode (HCC) 10/31/2021   OSA (obstructive sleep apnea) 10/15/2021   Depression, major, recurrent, mild (HCC) 04/30/2021   Generalized anxiety disorder 04/30/2021   Migraine 06/02/2017   Intercostal pain 08/25/2016   Knee pain, bilateral 07/14/2016   Venous stasis dermatitis of both lower extremities 05/26/2016   Stye external 03/25/2016   Plantar fasciitis, right 03/25/2016   Leg swelling 11/17/2015   Headache 09/08/2015   Pain of molar 05/26/2015   Sebaceous cyst of left axilla 04/13/2015   Vitamin D deficiency  03/29/2015   Allergic rhinitis 04/11/2010   Iron deficiency anemia 03/09/2010   Esophageal reflux 03/24/2009   MENORRHAGIA 03/17/2009   Morbid obesity with body mass index of 70 and over in adult [facility]) 05/15/2007   Depression 02/12/2007   Attention deficit hyperactivity disorder (ADHD), predominantly inattentive type 02/12/2007   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    REFERRING DIAG:  M25.562,G89.29 (ICD-10-CM) - Chronic pain of left knee M25.571 (ICD-10-CM) - Pain in right ankle and joints of right foot E66.01,Z68.45 (ICD-10-CM) - Morbid obesity with body mass index of 70 and over in adult [facility])  THERAPY DIAG:  Chronic pain of left knee - Plan: PT plan of care cert/re-cert  Muscle weakness (generalized) - Plan: PT plan of care cert/re-cert  Pain in right ankle and joints of right foot - Plan: PT plan of care cert/re-cert  Rationale for Evaluation and Treatment Rehabilitation  PERTINENT HISTORY: Depression, Asthma, ADHD   PRECAUTIONS: None   SUBJECTIVE:  SUBJECTIVE STATEMENT:  L knee pain persists, worse with inactivity but unable to distinctly cite aggravating factors.     PAIN:  Are you having pain?  Yes: NPRS scale: 7/10 Worst: 10/10 Pain location: bilateral knee, L>R Pain description: sharp Aggravating factors: walking, prolonged standing Relieving factors: rest   Are you having pain?  Yes: NPRS scale: 5/10 Worst: 10/10 Pain location: bilateral ankle R>L Pain description: achy Aggravating factors: walking, prolonged standing Relieving factors: rest   OBJECTIVE: (objective measures completed at initial evaluation unless otherwise dated)  DIAGNOSTIC FINDINGS:             See imaging   PATIENT SURVEYS:  FOTO: 40% function; 56% predicted; 04/01/23 34%; 05/05/23 36%    COGNITION: Overall cognitive status: Within functional limits for tasks assessed                         SENSATION: WFL   POSTURE:  large body body habitus and genu varus   PALPATION: TTP to L knee joint line   LOWER EXTREMITY ROM:   Active ROM Right eval Left eval R 05/05/23 L 05/05/23  Hip flexion        Hip extension        Hip abduction        Hip adduction        Hip internal rotation        Hip external rotation        Knee flexion WNL WNL [facility] WFL  Knee extension -3 -5 WFL WFL  Ankle dorsiflexion        Ankle plantarflexion        Ankle inversion        Ankle eversion         (Blank rows = not tested)   LOWER EXTREMITY MMT:   MMT Right eval Left eval R  05/05/23 L 05/05/23  Hip flexion 3+/5 3+/5 4-/5 4-/5  Hip extension        Hip abduction 3+/5 3+/5 4-/5 4-/5  Hip adduction        Hip internal rotation        Hip external rotation        Knee flexion 3+/5 3+/5 4-/5 4-/5  Knee extension 3+/5 3+/5 4-/5 4-/5  Ankle dorsiflexion        Ankle plantarflexion        Ankle inversion        Ankle eversion         (Blank rows = not tested)   LOWER EXTREMITY SPECIAL TESTS:  DNT   FUNCTIONAL TESTS:  30 Second Sit to Stand: 8 reps - with UE 05/05/23 9 stands w/o need of UE assist TUG: 14" with [facility] 05/05/23 19s with SPC   GAIT: Distance walked: 35ft Assistive device utilized: Single point cane Level of assistance: Modified independence Comments: wide BoS; antalgic gait L   TREATMENT: [facility] Adult PT Treatment:                                                DATE: 05/13/23 Therapeutic Exercise: NuStep lvl 4 UE/LE x 8 min  LAQ 2x10 5# B alternating Supine SLR 2x10 Bil 2# Bridge 2x10 Standing heel/toe raise against chair 2x10 Standing hamstring curl 5# 2x10 B STS 10x arms extended  [facility] Adult PT Treatment:  DATE: 05/05/23 Therapeutic Exercise: NuStep lvl 4 UE/LE x 8 min  LAQ 2x10 3# B alternating Seated  hamstring 2x10 blue band Bil Supine SLR 2x10 Bil Bridge 2x10 Standing heel/toe raise against wall 2x10  Therapeutic Activity: TUG 19s with SPC 30s chair stand test 9 reps w/o UE assist  [facility] Adult PT Treatment:                                                DATE: 04/22/23 Therapeutic Exercise: NuStep lvl 4 UE/LE x 8 min  LAQ 2x10 2# B alternating Seated hamstring 2x10 blue band Bil Supine SLR 2x10 Bil Bridge 2x10 STS 3x5, arms crossed and arms extended Standing heel/toe raise against wall 2x10 Lateral walk at counter x 2 laps Standing hip abd 2x10 each  TREATMENT 04/18/23:  Aquatic therapy at [facility]- [address] - therapeutic pool temp 92 degrees Pt enters building independently.  Treatment took place in water 3.8 to  4 ft 8 in.feet deep depending upon activity.  Pt entered and exited the pool via stair and handrails    Aquatic Therapy:  Water walking for warm up fwd/lat/bkwds  Standing: Walking march - 3 laps Hamstring curl 2x20 BIL Hip abd/add 2x20 BIL Jogging with yellow dumbell punches Lateral walking with shoulder adduction yellow DB  Hip Circles CC/CCW 2x10 each BIL S/L heel raises - x20 Hurdle hip openers 20x ea way Yellow noodle stomp x20 ea Squats 2x20 Step ups fwd and lat on submerged step 2x10 BIL   Pt requires the buoyancy of water for active assisted exercises with buoyancy supported for strengthening and AROM exercises. Hydrostatic pressure also supports joints by unweighting joint load by at least 50 % in 3-4 feet depth water. 80% in chest to neck deep water. Water will provide assistance with movement using the current and laminar flow while the buoyancy reduces weight bearing. Pt requires the viscosity of the water for resistance with strengthening exercises.    [facility] Adult PT Treatment:                                                DATE: 03/27/23 Therapeutic Exercise: NuStep lvl 4 UE/LE x 8 min  LAQ 2x10 2# B alternating Seated hamstring  2x10 blue band Bil Supine SLR 2x10 Bil Supine SAQs 2x10 Bil 2# STS 2x5, arms crossed and arms extended Standing heel raise against wall 10x2 Marching against wall 10/10 Hip extension against wall 10/10   [facility] Adult PT Treatment:                                                DATE: 03/25/23 Therapeutic Exercise: NuStep lvl 4 UE/LE x 6 min  LAQ 2x12 2.5# B alternating Seated hamstring 2x12 blue band Bil Supine SLR 2x10 Bil Supine SAQs 2x10 Bil STS 2x5, arms crossed and arms extended Standing heel raise against wall 12x2   [facility] Adult PT Treatment:  DATE: 03/20/2023 Therapeutic Exercise: NuStep lvl 4 UE/LE x 4 min while taking subejctive LAQ 2x10 2.5# Seated hamstring 2x10 blue band Supine SLR 3x5 each STS 2x5 Standing hip abd/ext x 10 each Standing heel raise x 10 Gait rolled into therex amb 274ft x 2 with sitting rest break between  [facility] Adult PT Treatment:                                                DATE: 03/18/2023 Therapeutic Exercise: LAQ 2x10 2.5# Seated hamstring 2x10 blue band Supine SLR 2x5 each Ankle DF/inv x 10 RTB STS 3x5 Standing hip abd 2x5 Standing heel raise x 10  [facility] Adult PT Treatment:                                                DATE: 02/27/2023 Therapeutic Exercise: Quad set x 5 - 5" hold LAQ x 5 each Seated hamstring curl x 10 GTB Seated PF with black band x 10 each   PATIENT EDUCATION:  Education details: eval findings, LEFS, HEP, POC Person educated: Patient Education method: Explanation, Demonstration, and Handouts Education comprehension: verbalized understanding and returned demonstration   HOME EXERCISE PROGRAM: Access Code: [email or web address] URL: [email or web address] Date: 02/27/2023 Prepared by: Edwinna Areola  ` Exercises - Supine Quadricep Sets  - 1 x daily - 7 x weekly - 2 sets - 10 reps - 5 sec hold - Seated Long Arc Quad  - 1 x daily - 7 x weekly - 3 sets - 10 reps - Seated  Hamstring Curl with Anchored Resistance  - 1 x daily - 7 x weekly - 3 sets - 10 reps - green band hold - Seated Ankle Plantarflexion with Resistance  - 1 x daily - 7 x weekly - 3 sets - 10 reps - black band hold   ASSESSMENT:   CLINICAL IMPRESSION: Wearing more supportive shoes today and ankle pain less.  Advanced weight and resistance as noted. Incorporated standing hamstring curls.  Able to tolerate all increases w/o setback     OBJECTIVE IMPAIRMENTS: Abnormal gait, decreased activity tolerance, decreased balance, decreased endurance, decreased mobility, difficulty walking, decreased strength, and pain.    ACTIVITY LIMITATIONS: sitting, standing, squatting, stairs, transfers, and locomotion level   PARTICIPATION LIMITATIONS: driving, shopping, community activity, yard work, and church   PERSONAL FACTORS: Fitness, Time since onset of injury/illness/exacerbation, and 3+ comorbidities: Depression, Asthma, ADHD   are also affecting patient's functional outcome.    GOALS: Goals reviewed with patient? No   SHORT TERM GOALS: Target date: 03/20/2023   Pt will be compliant and knowledgeable with initial HEP for improved comfort and carryover Baseline: initial HEP given  Goal status: Met   2.  Pt will self report knee and ankle pain no greater than 7/10 for improved comfort and functional ability Baseline: 10/10 at worst; 04/01/23 7/10 knee and 3/10 ankle pain  Goal status: Met   LONG TERM GOALS: Target date: 07/05/2023   Pt will improve FOTO function score to no less than 56% as proxy for functional improvement with home ADLs and community activity Baseline: 40% function; 04/01/23 34%; 05/05/23 36%  Goal status: Ongoing   2.  Pt will self report knee and ankle pain no  greater than 4/10 for improved comfort and functional ability Baseline: 10/10 at worst; 05/05/23 L knee pain averaging 7/10 Goal status: Ongoing   3.  Pt will increase 30 Second Sit to Stand rep count to no less than 10 reps  for improved balance, strength, and functional mobility Baseline: 8 reps - with UE; 05/05/23 9 reps w/o UE assist Goal status: Progressing   4.  Pt will improve TUG to no less than 11" with least restrictive AD for improved comfort and balance Baseline: 14" with SPC; 05/05/23 19s with SPC Goal status: Ongoing   PLAN:   PT FREQUENCY: 2x/week   PT DURATION: 3 weeks   PLANNED INTERVENTIONS: Therapeutic exercises, Therapeutic activity, Neuromuscular re-education, Balance training, Gait training, Patient/Family education, Self Care, Joint mobilization, Aquatic Therapy, Dry Needling, Electrical stimulation, Cryotherapy, Moist heat, Manual therapy, and Re-evaluation   PLAN FOR NEXT SESSION: assess HEP response, LE strengthening, gait training   Hildred Laser, PT 05/13/2023, 5:59 PM

## 2023-05-14 ENCOUNTER — Ambulatory Visit (INDEPENDENT_AMBULATORY_CARE_PROVIDER_SITE_OTHER): Payer: Commercial Managed Care - HMO | Admitting: Family Medicine

## 2023-05-14 ENCOUNTER — Other Ambulatory Visit: Payer: Self-pay

## 2023-05-14 ENCOUNTER — Encounter (INDEPENDENT_AMBULATORY_CARE_PROVIDER_SITE_OTHER): Payer: Self-pay | Admitting: Family Medicine

## 2023-05-14 VITALS — BP 104/71 | HR 88 | Temp 98.7°F | Ht 63.0 in | Wt >= 6400 oz

## 2023-05-14 DIAGNOSIS — F331 Major depressive disorder, recurrent, moderate: Secondary | ICD-10-CM | POA: Diagnosis not present

## 2023-05-14 DIAGNOSIS — G47419 Narcolepsy without cataplexy: Secondary | ICD-10-CM | POA: Diagnosis not present

## 2023-05-14 DIAGNOSIS — G4733 Obstructive sleep apnea (adult) (pediatric): Secondary | ICD-10-CM | POA: Diagnosis not present

## 2023-05-14 DIAGNOSIS — F330 Major depressive disorder, recurrent, mild: Secondary | ICD-10-CM | POA: Diagnosis not present

## 2023-05-14 DIAGNOSIS — Z6841 Body Mass Index (BMI) 40.0 and over, adult: Secondary | ICD-10-CM

## 2023-05-14 NOTE — Assessment & Plan Note (Signed)
Patient notes that depression has played a big part in her weight gain since high school.  She is currently on Wellbutrin XL 150 mg each morning.  She lacks a good support system.  She denies binge eating disorder or restrictive eating habits.  She has lacked motivation but has some interest in potentially attending classes and working in the future.  We discussed thinking about what her short-term and long-term goals are.  She is to continue her Wellbutrin XL 150 mg daily.  She will be a good candidate for cognitive behavioral therapy with Dr. Dewaine Conger and may require outside therapy as well.

## 2023-05-14 NOTE — Assessment & Plan Note (Signed)
She appears sleepy during her office visit today but does not fall asleep entirely.  She is currently on Adderall and follows with neurology for both OSA and narcolepsy.  She fears that her narcolepsy will interfere with her taking college classes online in the future and potentially returning to the workforce.  Follow-up with neurology.

## 2023-05-14 NOTE — Progress Notes (Signed)
Office: [phone]  /  Fax: [phone]   Initial Visit  [name] was seen in clinic today to evaluate for obesity. She is interested in losing weight to improve overall health and reduce the risk of weight related complications. She presents today to review program treatment options, initial physical assessment, and evaluation.     She was referred by: PCP  When asked what else they would like to accomplish? She states: Improve existing medical conditions, Improve quality of life, and Improve appearance  Weight history: she has been overweight starting in 8th grade with depression.  She remembers being 250 lb in high school.  She did lose weight in 2020.  Lives with mom and rarely eats out  When asked how has your weight affected you? She states: Contributed to medical problems, Contributed to orthopedic problems or mobility issues, Having fatigue, and Having poor endurance  Some associated conditions: OSA  Contributing factors: Nutritional, Stress, Reduced physical activity, and Mental health problems  Weight promoting medications identified: None  Current nutrition plan: None  Current level of physical activity: Limited due to chronic pain or orthopedic problems  Current or previous pharmacotherapy: Is interested in pharmacotherapy  Response to medication: Never tried medications   Past medical history includes:   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  Peak Weight:421 , Body Fat%:67.2, Visceral Fat Rating:34, Weight trend over the last 12 months: Increasing  General:  Alert, oriented and cooperative. Patient is in no acute distress.  Respiratory: Normal respiratory effort, no problems with respiration noted   Gait: able to ambulate independently  Mental Status: Normal mood and affect. Normal behavior. Normal judgment and thought content.   DIAGNOSTIC DATA REVIEWED:  BMET    Component Value Date/Time   NA 136 04/29/2023 2253   NA 140 08/01/2022 1603   K 4.0 04/29/2023 2253   CL 105 04/29/2023 2253   CO2 23 04/29/2023 2253   GLUCOSE 112 (H) 04/29/2023 2253   BUN 9 04/29/2023 2253   BUN 10 08/01/2022 1603   CREATININE 0.61 04/29/2023 2253   CREATININE 0.47 02/11/2023 1122   CREATININE 0.48 (L) 03/28/2015 1450   CALCIUM 8.7 (L) 04/29/2023 2253   GFRNONAA >60 04/29/2023 2253   GFRNONAA >60 02/11/2023 1122   GFRNONAA >89 03/28/2015 1450   GFRAA 146 01/17/2021 1420   GFRAA >89 03/28/2015 1450   Lab Results  Component Value Date   HGBA1C 5.6 08/01/2022   HGBA1C 5.30 03/28/2015   No results found for: "INSULIN" CBC    Component Value Date/Time   WBC 10.1 04/29/2023 2253   RBC 5.03 04/29/2023 2253   HGB 8.0 (L) 04/29/2023 2253   HGB 7.7 (L) 02/11/2023 1122   HGB  7.7 (L) 01/29/2023 1639   HCT 29.6 (L) 04/29/2023 2253   HCT 28.1 (L) 01/29/2023 1639   PLT 438 (H) 04/29/2023 2253   PLT 442 (H) 02/11/2023 1122   PLT 468 (H) 01/29/2023 1639   MCV 58.8 (L) 04/29/2023 2253   MCV 59 (L) 01/29/2023 1639   MCH 15.9 (L) 04/29/2023 2253   MCHC 27.0 (L) 04/29/2023 2253   RDW 23.4 (H) 04/29/2023 2253   RDW 21.2 (H) 01/29/2023 1639   Iron/TIBC/Ferritin/ %Sat    Component Value Date/Time   IRON 11 (L) 02/11/2023 1122   IRON 11 (L) 01/29/2023 1639   TIBC 414 02/11/2023 1122   TIBC 331 01/29/2023 1639   FERRITIN 6 (L) 02/11/2023 1122   FERRITIN 11 (L) 01/29/2023  1639   IRONPCTSAT 3 (L) 02/11/2023 1122   IRONPCTSAT 3 (LL) 01/29/2023 1639   IRONPCTSAT 1 (L) 02/13/2017 1517   Lipid Panel  No results found for: "CHOL", "TRIG", "HDL", "CHOLHDL", "VLDL", "LDLCALC", "LDLDIRECT" Hepatic Function Panel     Component Value Date/Time   PROT 6.7 04/30/2023 0916   PROT 7.0 01/17/2021 1420   ALBUMIN 2.8 (L) 04/30/2023 0916   ALBUMIN 3.9 01/17/2021 1420   AST 13 (L) 04/30/2023 0916   AST 9 (L) 02/11/2023 1122   ALT 15 04/30/2023 0916   ALT 10 02/11/2023 1122   ALKPHOS 93 04/30/2023 0916   BILITOT 0.5 04/30/2023 0916   BILITOT 0.4 02/11/2023 1122   BILIDIR <0.1 04/30/2023 0916   IBILI NOT CALCULATED 04/30/2023 0916      Component Value Date/Time   TSH 1.820 01/29/2023 1639     Assessment and Plan:   Depression, major, recurrent, mild (HCC)  Morbid obesity (HCC)  BMI 70 and over, adult (HCC)  Narcolepsy without cataplexy Assessment & Plan: She appears sleepy during her office visit today but does not fall asleep entirely.  She is currently on Adderall and follows with neurology for both OSA and narcolepsy.  She fears that her narcolepsy will interfere with her taking college classes online in the future and potentially returning to the workforce.  Follow-up with neurology.   OSA (obstructive sleep apnea) Assessment & Plan: She is currently not on a CPAP at night.  She appears to be sleepy and also suffers from narcolepsy.  Advised her to follow-up with neurology for obtaining a CPAP.  She apparently had 1 in the past.  She denies having any problems with claustrophobia.  Wearing a CPAP at night and aiming for 7 to 8 hours of sleep may help with both her energy levels and weight reduction.   Moderate episode of recurrent major depressive disorder [name]) Assessment & Plan: Patient notes that depression has played a big part in her weight gain since high school.  She is currently on Wellbutrin XL 150 mg each morning.  She lacks a good  support system.  She denies binge eating disorder or restrictive eating habits.  She has lacked motivation but has some interest in potentially attending classes and working in the future.  We discussed thinking about what her short-term and long-term goals are.  She is to continue her Wellbutrin XL 150 mg daily.  She will be a good candidate for cognitive behavioral therapy with Dr. Dewaine Conger and may require outside therapy as well.   Morbid obesity with body mass index of 70 and over in adult [facility]) Assessment & Plan: We discussed program information and her current bioimpedance results.  Her visceral fat rating is high putting her  at risk for fatty liver disease, type 2 diabetes and heart disease.  She has a lot of mechanical burden from her morbid obesity with physical limitations.  She will return for fasting IC, fasting labs.  EKG was reviewed from yesterday. She has some questions about bariatric surgery given her BMI over 70.  These were discussed today.  She is going to try medically supervised weight management first.         Obesity Treatment / Action Plan:  Patient will work on garnering support from family and friends to begin weight loss journey. Will work on eliminating or reducing the presence of highly palatable, calorie dense foods in the home. Will complete provided nutritional and psychosocial assessment questionnaire before the next appointment. Will be scheduled for indirect calorimetry to determine resting energy expenditure in a fasting state.  This will allow [place] to create a reduced calorie, high-protein meal plan to promote loss of fat mass while preserving muscle mass. Counseled on the health benefits of losing 5%-15% of total body weight. Was counseled on nutritional approaches to weight loss and benefits of reducing processed foods and consuming plant-based foods and high quality protein as part of nutritional weight management. Was counseled on pharmacotherapy and  role as an adjunct in weight management.   Obesity Education Performed Today:  She was weighed on the bioimpedance scale and results were discussed and documented in the synopsis.  We discussed obesity as a disease and the importance of a more detailed evaluation of all the factors contributing to the disease.  We discussed the importance of long term lifestyle changes which include nutrition, exercise and behavioral modifications as well as the importance of customizing this to her specific health and social needs.  We discussed the benefits of reaching a healthier weight to alleviate the symptoms of existing conditions and reduce the risks of the biomechanical, metabolic and psychological effects of obesity.  [name] appears to be in the action stage of change and states they are ready to start intensive lifestyle modifications and behavioral modifications.  30 minutes was spent today on this visit including the above counseling, pre-visit chart review, and post-visit documentation.  Reviewed by clinician on day of visit: allergies, medications, problem list, medical history, surgical history, family history, social history, and previous encounter notes pertinent to obesity diagnosis.    Seymour Bars, D.O. DABFM, DABOM [facility] [phone] [place], [place] [address] [phone]

## 2023-05-14 NOTE — Assessment & Plan Note (Signed)
She is currently not on a CPAP at night.  She appears to be sleepy and also suffers from narcolepsy.  Advised her to follow-up with neurology for obtaining a CPAP.  She apparently had 1 in the past.  She denies having any problems with claustrophobia.  Wearing a CPAP at night and aiming for 7 to 8 hours of sleep may help with both her energy levels and weight reduction.

## 2023-05-14 NOTE — Assessment & Plan Note (Signed)
We discussed program information and her current bioimpedance results.  Her visceral fat rating is high putting her at risk for fatty liver disease, type 2 diabetes and heart disease.  She has a lot of mechanical burden from her morbid obesity with physical limitations.  She will return for fasting IC, fasting labs.  EKG was reviewed from yesterday. She has some questions about bariatric surgery given her BMI over 70.  These were discussed today.  She is going to try medically supervised weight management first.

## 2023-05-15 ENCOUNTER — Ambulatory Visit: Payer: Commercial Managed Care - HMO

## 2023-05-15 DIAGNOSIS — M25562 Pain in left knee: Secondary | ICD-10-CM | POA: Diagnosis not present

## 2023-05-15 DIAGNOSIS — M25571 Pain in right ankle and joints of right foot: Secondary | ICD-10-CM | POA: Diagnosis not present

## 2023-05-15 DIAGNOSIS — G8929 Other chronic pain: Secondary | ICD-10-CM

## 2023-05-15 DIAGNOSIS — R600 Localized edema: Secondary | ICD-10-CM | POA: Diagnosis not present

## 2023-05-15 DIAGNOSIS — M6281 Muscle weakness (generalized): Secondary | ICD-10-CM

## 2023-05-15 NOTE — Therapy (Signed)
OUTPATIENT PHYSICAL THERAPY TREATMENT NOTE   Patient Name: [name] MRN: [id number] DOB:1991/09/03, 32 y.o., female Today's Date: 05/15/2023  PCP: Hoy Register, MD  REFERRING PROVIDER: Persons, [place], [place]   END OF SESSION:   PT End of Session - 05/15/23 1706     Visit Number 12    Number of Visits 17    Date for PT Re-Evaluation 07/05/23    Authorization Type Cign[employer]    Authorization Time Period FOTO v6, v10    PT Start Time 1705    PT Stop Time 1743    PT Time Calculation (min) 38 min    Activity Tolerance Patient tolerated treatment well;Patient limited by pain    Behavior During Therapy [facility] for tasks assessed/performed             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   ahi 5.5 ,mslt 07/01/06(off meds) mean latency 0 , with 4 sorems npsg 04/24/2010 2.2/hr   Exogenous obesity    Narcolepsy cataplexy syndrome 2007   Prior sleep studies starting on September 26 2005, AHI 5.3 per hour,   Poor sleep hygiene    Past Surgical History:  Procedure Laterality Date   none     Patient Active Problem List   Diagnosis Date Noted   Unilateral primary osteoarthritis, left knee 02/11/2023   Dental abscess 02/21/2022   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 prior episode (HCC) 10/31/2021   OSA (obstructive sleep apnea) 10/15/2021   Depression, major, recurrent, mild (HCC) 04/30/2021   Generalized anxiety disorder 04/30/2021   Migraine 06/02/2017   Intercostal pain 08/25/2016   Knee pain, bilateral 07/14/2016   Venous stasis dermatitis of both lower extremities 05/26/2016   Stye external 03/25/2016   Plantar fasciitis, right 03/25/2016   Leg swelling 11/17/2015   Headache 09/08/2015   Pain of molar 05/26/2015   Sebaceous cyst of left axilla 04/13/2015   Vitamin D deficiency 03/29/2015    Allergic rhinitis 04/11/2010   Iron deficiency anemia 03/09/2010   Esophageal reflux 03/24/2009   MENORRHAGIA 03/17/2009   Morbid obesity with body mass index of 70 and over in adult [facility]) 05/15/2007   Depression 02/12/2007   Attention deficit hyperactivity disorder (ADHD), predominantly inattentive type 02/12/2007   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    REFERRING DIAG:  M25.562,G89.29 (ICD-10-CM) - Chronic pain of left knee M25.571 (ICD-10-CM) - Pain in right ankle and joints of right foot E66.01,Z68.45 (ICD-10-CM) - Morbid obesity with body mass index of 70 and over in adult [facility])  THERAPY DIAG:  Chronic pain of left knee  Muscle weakness (generalized)  Pain in right ankle and joints of right foot  Rationale for Evaluation and Treatment Rehabilitation  PERTINENT HISTORY: Depression, Asthma, ADHD   PRECAUTIONS: None   SUBJECTIVE:  SUBJECTIVE STATEMENT:  Patient reports continued knee pain, also endorses headache and nausea today.    PAIN:  Are you having pain?  Yes: NPRS scale: 5/10 Worst: 10/10 Pain location: bilateral knee, L>R Pain description: sharp Aggravating factors: walking, prolonged standing Relieving factors: rest   Are you having pain?  Yes: NPRS scale: 5/10 Worst: 10/10 Pain location: bilateral ankle R>L Pain description: achy Aggravating factors: walking, prolonged standing Relieving factors: rest   OBJECTIVE: (objective measures completed at initial evaluation unless otherwise dated)  DIAGNOSTIC FINDINGS:             See imaging   PATIENT SURVEYS:  FOTO: 40% function; 56% predicted; 04/01/23 34%; 05/05/23 36%   COGNITION: Overall cognitive status: Within functional limits for tasks assessed                         SENSATION: WFL   POSTURE:  large body body  habitus and genu varus   PALPATION: TTP to L knee joint line   LOWER EXTREMITY ROM:   Active ROM Right eval Left eval R 05/05/23 L 05/05/23  Hip flexion        Hip extension        Hip abduction        Hip adduction        Hip internal rotation        Hip external rotation        Knee flexion WNL WNL [facility] WFL  Knee extension -3 -5 WFL WFL  Ankle dorsiflexion        Ankle plantarflexion        Ankle inversion        Ankle eversion         (Blank rows = not tested)   LOWER EXTREMITY MMT:   MMT Right eval Left eval R  05/05/23 L 05/05/23  Hip flexion 3+/5 3+/5 4-/5 4-/5  Hip extension        Hip abduction 3+/5 3+/5 4-/5 4-/5  Hip adduction        Hip internal rotation        Hip external rotation        Knee flexion 3+/5 3+/5 4-/5 4-/5  Knee extension 3+/5 3+/5 4-/5 4-/5  Ankle dorsiflexion        Ankle plantarflexion        Ankle inversion        Ankle eversion         (Blank rows = not tested)   LOWER EXTREMITY SPECIAL TESTS:  DNT   FUNCTIONAL TESTS:  30 Second Sit to Stand: 8 reps - with UE 05/05/23 9 stands w/o need of UE assist TUG: 14" with [facility] 05/05/23 19s with SPC   GAIT: Distance walked: 55ft Assistive device utilized: Single point cane Level of assistance: Modified independence Comments: wide BoS; antalgic gait L   TREATMENT: [facility] Adult PT Treatment:                                                DATE: 05/15/23 Therapeutic Exercise: NuStep lvl 5 UE/LE x 5 min  LAQ 2x10 5# B alternating Standing hamstring curl 5# 2x10 B Seated marching 5# 3x30" Standing heel/toe raise against chair 5# 2x10 STS 10x arms extended  [facility] Adult PT Treatment:  DATE: 05/13/23 Therapeutic Exercise: NuStep lvl 4 UE/LE x 8 min  LAQ 2x10 5# B alternating Supine SLR 2x10 Bil 2# Bridge 2x10 Standing heel/toe raise against chair 2x10 Standing hamstring curl 5# 2x10 B STS 10x arms extended  [facility] Adult PT Treatment:                                                 DATE: 05/05/23 Therapeutic Exercise: NuStep lvl 4 UE/LE x 8 min  LAQ 2x10 3# B alternating Seated hamstring 2x10 blue band Bil Supine SLR 2x10 Bil Bridge 2x10 Standing heel/toe raise against wall 2x10  Therapeutic Activity: TUG 19s with SPC 30s chair stand test 9 reps w/o UE assist     PATIENT EDUCATION:  Education details: eval findings, LEFS, HEP, POC Person educated: Patient Education method: Explanation, Demonstration, and Handouts Education comprehension: verbalized understanding and returned demonstration   HOME EXERCISE PROGRAM: Access Code: [email or web address] URL: [email or web address] Date: 02/27/2023 Prepared by: Edwinna Areola  ` Exercises - Supine Quadricep Sets  - 1 x daily - 7 x weekly - 2 sets - 10 reps - 5 sec hold - Seated Long Arc Quad  - 1 x daily - 7 x weekly - 3 sets - 10 reps - Seated Hamstring Curl with Anchored Resistance  - 1 x daily - 7 x weekly - 3 sets - 10 reps - green band hold - Seated Ankle Plantarflexion with Resistance  - 1 x daily - 7 x weekly - 3 sets - 10 reps - black band hold   ASSESSMENT:   CLINICAL IMPRESSION:  Patient presents to PT with continued knee pain and also endorses a headache today and appears more fatigued. Session today focused on LE strengthening with incorporation of more standing exercises to good effect. After STS patient reports that she feels a little nauseous and this is the second day she's had headache and nausea, advised to follow up with PCP if this persists. Patient continues to benefit from skilled PT services and should be progressed as able to improve functional independence.      OBJECTIVE IMPAIRMENTS: Abnormal gait, decreased activity tolerance, decreased balance, decreased endurance, decreased mobility, difficulty walking, decreased strength, and pain.    ACTIVITY LIMITATIONS: sitting, standing, squatting, stairs, transfers, and locomotion level   PARTICIPATION  LIMITATIONS: driving, shopping, community activity, yard work, and church   PERSONAL FACTORS: Fitness, Time since onset of injury/illness/exacerbation, and 3+ comorbidities: Depression, Asthma, ADHD   are also affecting patient's functional outcome.    GOALS: Goals reviewed with patient? No   SHORT TERM GOALS: Target date: 03/20/2023   Pt will be compliant and knowledgeable with initial HEP for improved comfort and carryover Baseline: initial HEP given  Goal status: Met   2.  Pt will self report knee and ankle pain no greater than 7/10 for improved comfort and functional ability Baseline: 10/10 at worst; 04/01/23 7/10 knee and 3/10 ankle pain  Goal status: Met   LONG TERM GOALS: Target date: 07/05/2023   Pt will improve FOTO function score to no less than 56% as proxy for functional improvement with home ADLs and community activity Baseline: 40% function; 04/01/23 34%; 05/05/23 36%  Goal status: Ongoing   2.  Pt will self report knee and ankle pain no greater than 4/10 for improved comfort and functional ability Baseline: 10/10 at worst; 05/05/23 L knee  pain averaging 7/10 Goal status: Ongoing   3.  Pt will increase 30 Second Sit to Stand rep count to no less than 10 reps for improved balance, strength, and functional mobility Baseline: 8 reps - with UE; 05/05/23 9 reps w/o UE assist Goal status: Progressing   4.  Pt will improve TUG to no less than 11" with least restrictive AD for improved comfort and balance Baseline: 14" with SPC; 05/05/23 19s with SPC Goal status: Ongoing   PLAN:   PT FREQUENCY: 2x/week   PT DURATION: 3 weeks   PLANNED INTERVENTIONS: Therapeutic exercises, Therapeutic activity, Neuromuscular re-education, Balance training, Gait training, Patient/Family education, Self Care, Joint mobilization, Aquatic Therapy, Dry Needling, Electrical stimulation, Cryotherapy, Moist heat, Manual therapy, and Re-evaluation   PLAN FOR NEXT SESSION: assess HEP response, LE  strengthening, gait training   Berta Minor, PTA 05/15/2023, 5:43 PM

## 2023-05-20 ENCOUNTER — Encounter (HOSPITAL_COMMUNITY): Payer: Self-pay

## 2023-05-20 ENCOUNTER — Other Ambulatory Visit: Payer: Self-pay

## 2023-05-20 ENCOUNTER — Ambulatory Visit (HOSPITAL_COMMUNITY)
Admission: EM | Admit: 2023-05-20 | Discharge: 2023-05-20 | Disposition: A | Discharge status: Home Health | Payer: Commercial Managed Care - HMO | Attending: Physician Assistant | Admitting: Physician Assistant

## 2023-05-20 ENCOUNTER — Ambulatory Visit (INDEPENDENT_AMBULATORY_CARE_PROVIDER_SITE_OTHER): Payer: Commercial Managed Care - HMO

## 2023-05-20 DIAGNOSIS — M25561 Pain in right knee: Secondary | ICD-10-CM

## 2023-05-20 DIAGNOSIS — Z043 Encounter for examination and observation following other accident: Secondary | ICD-10-CM | POA: Diagnosis not present

## 2023-05-20 MED ORDER — POVIDONE-IODINE 10 % EX SOLN
CUTANEOUS | Status: AC
  Filled 2023-05-20: qty 118

## 2023-05-20 NOTE — ED Triage Notes (Signed)
Patient states she slipped on something wet on the floor 3 days ago and is now having right hip and knee pain.  Patient states she took Ibuprofen yesterday for her pain.

## 2023-05-20 NOTE — Discharge Instructions (Addendum)
Ice to right knee,

## 2023-05-20 NOTE — ED Provider Notes (Signed)
[facility]    CSN: [id number] Arrival date & time: 05/20/23  1811      History   Chief Complaint Chief Complaint  Patient presents with   Fall   Knee Pain   Hip Pain    HPI [name] is a 32 y.o. female.   Pt reports she fell 3 days.    The history is provided by the patient. No language interpreter was used.  Fall This is a new problem. The current episode started less than 1 hour ago. The problem occurs constantly. Nothing aggravates the symptoms. Nothing relieves the symptoms. She has tried nothing for the symptoms. The treatment provided no relief.  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   ahi 5.5 ,mslt 07/01/06(off meds) mean latency 0 , with 4 sorems npsg 04/24/2010 2.2/hr   Exogenous obesity    Narcolepsy cataplexy syndrome 2007   Prior sleep studies starting on September 26 2005, AHI 5.3 per hour,   Poor sleep hygiene     Patient Active Problem List   Diagnosis Date Noted   Unilateral primary osteoarthritis, left knee 02/11/2023   Dental abscess 02/21/2022   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 prior episode (HCC) 10/31/2021   OSA (obstructive sleep apnea) 10/15/2021   Depression, major, recurrent, mild (HCC) 04/30/2021   Generalized anxiety disorder 04/30/2021   Migraine 06/02/2017   Intercostal pain 08/25/2016   Knee pain, bilateral 07/14/2016   Venous stasis dermatitis of both lower extremities 05/26/2016   Stye external 03/25/2016   Plantar fasciitis, right 03/25/2016   Leg swelling 11/17/2015   Headache 09/08/2015   Pain of molar 05/26/2015   Sebaceous cyst of left axilla 04/13/2015   Vitamin D deficiency 03/29/2015   Allergic rhinitis 04/11/2010   Iron deficiency anemia 03/09/2010   Esophageal reflux 03/24/2009   MENORRHAGIA  03/17/2009   Morbid obesity with body mass index of 70 and over in adult [facility]) 05/15/2007   Depression 02/12/2007   Attention deficit hyperactivity disorder (ADHD), predominantly inattentive type 02/12/2007   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      Home Medications    Prior to Admission medications   Medication Sig Start Date End Date Taking? Authorizing Provider  acetaminophen (TYLENOL) 500 MG tablet Take 1,000 mg by mouth every 6 (six) hours as needed for mild pain.    [provider]  amphetamine-dextroamphetamine (ADDERALL) 20 MG tablet Take 1 tablet (20 mg total) by mouth daily. 08/21/22   Shanna Cisco, NP  buPROPion (WELLBUTRIN XL) 150 MG 24 hr tablet Take 1 tablet (150 mg total) by mouth every morning. 04/25/23   Shanna Cisco, NP  cetirizine (ZYRTEC) 10 MG tablet Take 1 tablet (10 mg total) by mouth daily. 02/18/23   Hoy Register, MD  cyclobenzaprine (FLEXERIL) 10 MG tablet Take 0.5 tablets (5 mg total) by mouth 3 (three) times daily as needed for muscle spasms. 01/29/23   Anders Simmonds, PA-C  famotidine (PEPCID) 20 MG tablet Take 1 tablet (20 mg total) by mouth daily. 04/30/23   Ernie Avena, MD  ferrous sulfate 325 (65 FE) MG tablet  Take 1 tablet (325 mg total) by mouth daily. 01/29/23   Anders Simmonds, PA-C  gabapentin (NEURONTIN) 300 MG capsule Take 1 capsule (300 mg total) by mouth 3 (three) times daily. 08/07/22   Hoy Register, MD  hydrOXYzine (ATARAX) 10 MG tablet Take 1 tablet (10 mg total) by mouth 3 (three) times daily as needed. 04/25/23   Shanna Cisco, NP  meloxicam (MOBIC) 7.5 MG tablet Take 1 tablet (7.5 mg total) by mouth daily. 08/01/22   Anders Simmonds, PA-C  omeprazole (PRILOSEC) 40 MG capsule Take 1 capsule (40 mg total) by mouth daily. 05/13/23   Hoy Register, MD  prochlorperazine (COMPAZINE) 10 MG tablet  Take 1 tablet (10 mg total) by mouth 2 (two) times daily as needed for nausea or vomiting. 04/30/23   Ernie Avena, MD  topiramate (TOPAMAX) 50 MG tablet Take 1 tablet (50 mg total) by mouth 2 (two) times daily. 08/01/22   Anders Simmonds, PA-C  tranexamic acid (LYSTEDA) 650 MG TABS tablet Take 2 tablets (1,300 mg total) by mouth 3 (three) times daily. Take during menses for a maximum of five days 09/25/22   Lorriane Shire, MD  Vitamin D, Ergocalciferol, (DRISDOL) 1.25 MG (50000 UNIT) CAPS capsule Take 1 capsule (50,000 Units total) by mouth every 7 (seven) days. 01/30/23   Anders Simmonds, PA-C    Family History Family History  Problem Relation Age of Onset   Hypertension Mother    Asthma Mother    Alcohol abuse Father    Asthma Brother    Alcohol abuse Maternal Grandmother     Social History Social History   Tobacco Use   Smoking status: Former    Types: Cigarettes   Smokeless tobacco: Former    Quit date: 04/2022   Tobacco comments:    2 cigarettes/ day  Vaping Use   Vaping Use: Never used  Substance Use Topics   Alcohol use: Yes    Comment: ocassional   Drug use: Yes    Types: Marijuana     Allergies   Banana and Food   Review of Systems Review of Systems  All other systems reviewed and are negative.    Physical Exam Triage Vital Signs ED Triage Vitals [05/20/23 1904]  Enc Vitals Group     BP 126/85     Pulse Rate 90     Resp 18     Temp 98.6 F (37 C)     Temp Source Oral     SpO2 98 %     Weight      Height      Head Circumference      Peak Flow      Pain Score      Pain Loc      Pain Edu?      Excl. in GC?    No data found.  Updated Vital Signs BP 126/85 (BP Location: Right Arm)   Pulse 90   Temp 98.6 F (37 C) (Oral)   Resp 18   LMP 05/02/2023 (Approximate)   SpO2 98%   Visual Acuity Right Eye Distance:   Left Eye Distance:   Bilateral Distance:    Right Eye Near:   Left Eye Near:    Bilateral Near:     Physical  Exam Vitals reviewed.  Constitutional:      Appearance: Normal appearance.  HENT:     Mouth/Throat:     Mouth: Mucous membranes are moist.  Eyes:  Extraocular Movements: Extraocular movements intact.     Pupils: Pupils are equal, round, and reactive to light.  Cardiovascular:     Rate and Rhythm: Normal rate and regular rhythm.  Pulmonary:     Effort: Pulmonary effort is normal.  Abdominal:     General: Abdomen is flat.  Musculoskeletal:        General: Normal range of motion.  Skin:    General: Skin is warm.  Neurological:     General: No focal deficit present.     Mental Status: She is alert.  Psychiatric:        Mood and Affect: Mood normal.      UC Treatments / Results  Labs (all labs ordered are listed, but only abnormal results are displayed) Labs Reviewed - No data to display  EKG   Radiology DG Knee Complete 4 Views Right  Result Date: 05/20/2023 CLINICAL DATA:  Fall EXAM: RIGHT KNEE - COMPLETE 4+ VIEW COMPARISON:  None Available. FINDINGS: No evidence of fracture, dislocation, or joint effusion. No evidence of arthropathy or other focal bone abnormality. Soft tissues are unremarkable. IMPRESSION: Negative. Electronically Signed   By: Darliss Cheney M.D.   On: 05/20/2023 19:56    Procedures Procedures (including critical care time)  Medications Ordered in UC Medications - No data to display  Initial Impression / Assessment and Plan / UC Course  I have reviewed the triage vital signs and the nursing notes.  Pertinent labs & imaging results that were available during my care of the patient were reviewed by me and considered in my medical decision making (see chart for details).      Final Clinical Impressions(s) / UC Diagnoses   Final diagnoses:  Acute pain of right knee   Discharge Instructions   None    ED Prescriptions   None    PDMP not reviewed this encounter. An After Visit Summary was printed and given to the patient.    Elson Areas, [place] 05/20/23 2013

## 2023-05-22 ENCOUNTER — Ambulatory Visit: Payer: Commercial Managed Care - HMO | Attending: Family Medicine | Admitting: Physical Therapy

## 2023-05-22 ENCOUNTER — Encounter: Payer: Self-pay | Admitting: Physical Therapy

## 2023-05-22 DIAGNOSIS — M25571 Pain in right ankle and joints of right foot: Secondary | ICD-10-CM | POA: Insufficient documentation

## 2023-05-22 DIAGNOSIS — G8929 Other chronic pain: Secondary | ICD-10-CM | POA: Insufficient documentation

## 2023-05-22 DIAGNOSIS — M5459 Other low back pain: Secondary | ICD-10-CM | POA: Insufficient documentation

## 2023-05-22 DIAGNOSIS — R600 Localized edema: Secondary | ICD-10-CM | POA: Diagnosis not present

## 2023-05-22 DIAGNOSIS — R262 Difficulty in walking, not elsewhere classified: Secondary | ICD-10-CM | POA: Insufficient documentation

## 2023-05-22 DIAGNOSIS — M25562 Pain in left knee: Secondary | ICD-10-CM | POA: Diagnosis not present

## 2023-05-22 DIAGNOSIS — M6281 Muscle weakness (generalized): Secondary | ICD-10-CM | POA: Insufficient documentation

## 2023-05-22 NOTE — Therapy (Signed)
OUTPATIENT PHYSICAL THERAPY TREATMENT NOTE   Patient Name: [name] MRN: [id number] DOB:1991-03-08, 32 y.o., female Today's Date: 05/23/2023  PCP: Hoy Register, MD  REFERRING PROVIDER: Persons, [place], [place]   END OF SESSION:   PT End of Session - 05/22/23 1628     Visit Number 13    Number of Visits 17    Date for PT Re-Evaluation 07/05/23    Authorization Type [employer]    Authorization Time Period FOTO v6, v10    PT Start Time 1625    PT Stop Time 1703    PT Time Calculation (min) 38 min    Activity Tolerance Patient tolerated treatment well;Patient limited by pain    Behavior During Therapy [facility] for tasks assessed/performed                 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   ahi 5.5 ,mslt 07/01/06(off meds) mean latency 0 , with 4 sorems npsg 04/24/2010 2.2/hr   Exogenous obesity    Narcolepsy cataplexy syndrome 2007   Prior sleep studies starting on September 26 2005, AHI 5.3 per hour,   Poor sleep hygiene    Past Surgical History:  Procedure Laterality Date   none     Patient Active Problem List   Diagnosis Date Noted   Unilateral primary osteoarthritis, left knee 02/11/2023   Dental abscess 02/21/2022   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 prior episode (HCC) 10/31/2021   OSA (obstructive sleep apnea) 10/15/2021   Depression, major, recurrent, mild (HCC) 04/30/2021   Generalized anxiety disorder 04/30/2021   Migraine 06/02/2017   Intercostal pain 08/25/2016   Knee pain, bilateral 07/14/2016   Venous stasis dermatitis of both lower extremities 05/26/2016   Stye external 03/25/2016   Plantar fasciitis, right 03/25/2016   Leg swelling 11/17/2015   Headache 09/08/2015   Pain of molar 05/26/2015   Sebaceous cyst of left axilla 04/13/2015   Vitamin D deficiency 03/29/2015    Allergic rhinitis 04/11/2010   Iron deficiency anemia 03/09/2010   Esophageal reflux 03/24/2009   MENORRHAGIA 03/17/2009   Morbid obesity with body mass index of 70 and over in adult [facility]) 05/15/2007   Depression 02/12/2007   Attention deficit hyperactivity disorder (ADHD), predominantly inattentive type 02/12/2007   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  She states she slipped but does not know how.  She went to urgent care and was cleared for fracture.  Her R knee is more sore as a result.   PAIN:  Are you having pain?  Yes: NPRS scale: 7/10 Worst: 10/10 Pain location: bilateral knee, L>R Pain description: sharp Aggravating factors: walking, prolonged standing Relieving factors: rest   Are you having pain?  Yes: NPRS scale: 5/10 Worst: 10/10 Pain location: bilateral ankle R>L Pain description: achy Aggravating factors: walking, prolonged standing Relieving factors: rest   OBJECTIVE: (objective measures completed at initial evaluation unless otherwise dated)  DIAGNOSTIC FINDINGS:             See imaging   PATIENT SURVEYS:  FOTO: 40% function; 56% predicted; 04/01/23 34%   COGNITION: Overall cognitive status: Within functional limits for tasks  assessed                         SENSATION: WFL   POSTURE:  large body body habitus and genu varus   PALPATION: TTP to L knee joint line   LOWER EXTREMITY ROM:   Active ROM Right eval Left eval  Hip flexion      Hip extension      Hip abduction      Hip adduction      Hip internal rotation      Hip external rotation      Knee flexion WNL WNL  Knee extension -3 -5  Ankle dorsiflexion      Ankle plantarflexion      Ankle inversion      Ankle eversion       (Blank rows = not tested)   LOWER EXTREMITY MMT:   MMT Right eval Left eval  Hip flexion 3+/5 3+/5  Hip extension      Hip abduction 3+/5 3+/5  Hip adduction      Hip internal rotation      Hip external rotation      Knee flexion 3+/5 3+/5  Knee extension 3+/5 3+/5  Ankle dorsiflexion      Ankle plantarflexion      Ankle inversion      Ankle eversion       (Blank rows = not tested)   LOWER EXTREMITY SPECIAL TESTS:  DNT   FUNCTIONAL TESTS:  30 Second Sit to Stand: 8 reps - with UE TUG: 14" with SPC   GAIT: Distance walked: 62ft Assistive device utilized: Single point cane Level of assistance: Modified independence Comments: wide BoS; antalgic gait L   TREATMENT:  TREATMENT 05/22/23:  Aquatic therapy at [facility]- [address] - therapeutic pool temp 92 degrees Pt enters building independently.  Treatment took place in water 3.8 to  4 ft 8 in.feet deep depending upon activity.  Pt entered and exited the pool via stair and handrails    Aquatic Therapy:  Water walking for warm up fwd/lat/bkwds  Standing: Walking march - 3 laps Hamstring curl 2x20 BIL Hip abd/add 2x20 BIL Jogging with yellow dumbell punches Lateral walking with shoulder adduction yellow DB  Hip Circles CC/CCW 2x10 each BIL S/L heel raises - x20 Hurdle hip openers 20x ea way Yellow noodle stomp x20 ea Squats 2x20 Step ups fwd and lat on submerged step 2x10 BIL   Pt requires the buoyancy of water for active  assisted exercises with buoyancy supported for  strengthening and AROM exercises. Hydrostatic pressure also supports joints by unweighting joint load by at least 50 % in 3-4 feet depth water. 80% in chest to neck deep water. Water will provide assistance with movement using the current and laminar flow while the buoyancy reduces weight bearing. Pt requires the viscosity of the water for resistance with strengthening exercises.  TREATMENT 04/18/23:  Aquatic therapy at [facility]- [address] - therapeutic pool temp 92 degrees Pt enters building independently.  Treatment took place in water 3.8 to  4 ft 8 in.feet deep depending upon activity.  Pt entered and exited the pool via stair and handrails    Aquatic Therapy:  Water walking for warm up fwd/lat/bkwds  Standing: Walking march - 3 laps Hamstring curl 2x20 BIL Hip abd/add 2x20 BIL Jogging with yellow dumbell punches Lateral walking with shoulder adduction yellow DB  Hip Circles CC/CCW 2x10 each BIL S/L heel raises - x20 Hurdle hip openers 20x ea way Yellow noodle stomp x20 ea Squats 2x20 Step ups fwd and lat on submerged step 2x10 BIL   Pt requires the buoyancy of water for active assisted exercises with buoyancy supported for strengthening and AROM exercises. Hydrostatic pressure also supports joints by unweighting joint load by at least 50 % in 3-4 feet depth water. 80% in chest to neck deep water. Water will provide assistance with movement using the current and laminar flow while the buoyancy reduces weight bearing. Pt requires the viscosity of the water for resistance with strengthening exercises.    [facility] Adult PT Treatment:                                                DATE: 03/27/23 Therapeutic Exercise: NuStep lvl 4 UE/LE x 8 min  LAQ 2x10 2# B alternating Seated hamstring 2x10 blue band Bil Supine SLR 2x10 Bil Supine SAQs 2x10 Bil 2# STS 2x5, arms crossed and arms extended Standing heel raise against wall  10x2 Marching against wall 10/10 Hip extension against wall 10/10   [facility] Adult PT Treatment:                                                DATE: 03/25/23 Therapeutic Exercise: NuStep lvl 4 UE/LE x 6 min  LAQ 2x12 2.5# B alternating Seated hamstring 2x12 blue band Bil Supine SLR 2x10 Bil Supine SAQs 2x10 Bil STS 2x5, arms crossed and arms extended Standing heel raise against wall 12x2   [facility] Adult PT Treatment:                                                DATE: 03/20/2023 Therapeutic Exercise: NuStep lvl 4 UE/LE x 4 min while taking subejctive LAQ 2x10 2.5# Seated hamstring 2x10 blue band Supine SLR 3x5 each STS 2x5 Standing hip abd/ext x 10 each Standing heel raise x 10 Gait rolled into therex amb 247ft x 2 with sitting rest break between  [facility] Adult PT Treatment:  DATE: 03/18/2023 Therapeutic Exercise: LAQ 2x10 2.5# Seated hamstring 2x10 blue band Supine SLR 2x5 each Ankle DF/inv x 10 RTB STS 3x5 Standing hip abd 2x5 Standing heel raise x 10  [facility] Adult PT Treatment:                                                DATE: 02/27/2023 Therapeutic Exercise: Quad set x 5 - 5" hold LAQ x 5 each Seated hamstring curl x 10 GTB Seated PF with black band x 10 each   PATIENT EDUCATION:  Education details: eval findings, LEFS, HEP, POC Person educated: Patient Education method: Explanation, Demonstration, and Handouts Education comprehension: verbalized understanding and returned demonstration   HOME EXERCISE PROGRAM: Access Code: [email or web address] URL: [email or web address] Date: 02/27/2023 Prepared by: Edwinna Areola   Exercises - Supine Quadricep Sets  - 1 x daily - 7 x weekly - 2 sets - 10 reps - 5 sec hold - Seated Long Arc Quad  - 1 x daily - 7 x weekly - 3 sets - 10 reps - Seated Hamstring Curl with Anchored Resistance  - 1 x daily - 7 x weekly - 3 sets - 10 reps - green band hold - Seated Ankle Plantarflexion with  Resistance  - 1 x daily - 7 x weekly - 3 sets - 10 reps - black band hold   ASSESSMENT:   CLINICAL IMPRESSION:   Session today focused on LE strengthening and general activity tolerance in the aquatic environment for use of buoyancy to offload joints and the viscosity of water as resistance during therapeutic exercise.  Omayra shows improved endurance, requiring decreased rest breaks.  Patient was able to tolerate all prescribed exercises in the aquatic environment with no adverse effects and reports 6/10 pain at the end of the session. Patient continues to benefit from skilled PT services on land and aquatic based and should be progressed as able to improve functional independence.    OBJECTIVE IMPAIRMENTS: Abnormal gait, decreased activity tolerance, decreased balance, decreased endurance, decreased mobility, difficulty walking, decreased strength, and pain.    ACTIVITY LIMITATIONS: sitting, standing, squatting, stairs, transfers, and locomotion level   PARTICIPATION LIMITATIONS: driving, shopping, community activity, yard work, and church   PERSONAL FACTORS: Fitness, Time since onset of injury/illness/exacerbation, and 3+ comorbidities: Depression, Asthma, ADHD   are also affecting patient's functional outcome.    GOALS: Goals reviewed with patient? No   SHORT TERM GOALS: Target date: 03/20/2023   Pt will be compliant and knowledgeable with initial HEP for improved comfort and carryover Baseline: initial HEP given  Goal status: Met   2.  Pt will self report knee and ankle pain no greater than 7/10 for improved comfort and functional ability Baseline: 10/10 at worst; 04/01/23 7/10 knee and 3/10 ankle pain  Goal status: Met   LONG TERM GOALS: Target date: 04/24/2023   Pt will improve FOTO function score to no less than 56% as proxy for functional improvement with home ADLs and community activity Baseline: 40% function; 04/01/23 34% Goal status: INITIAL    2.  Pt will self report  knee and ankle pain no greater than 4/10 for improved comfort and functional ability Baseline: 10/10 at worst Goal status: INITIAL    3.  Pt will increase 30 Second Sit to Stand rep count to no less than 10 reps for improved balance, strength, and  functional mobility Baseline: 8 reps - with UE Goal status: INITIAL    4.  Pt will improve TUG to no less than 11" with least restrictive AD for improved comfort and balance Baseline: 14" with SPC Goal status: INITIAL   PLAN:   PT FREQUENCY: 2x/week   PT DURATION: 8 weeks   PLANNED INTERVENTIONS: Therapeutic exercises, Therapeutic activity, Neuromuscular re-education, Balance training, Gait training, Patient/Family education, Self Care, Joint mobilization, Aquatic Therapy, Dry Needling, Electrical stimulation, Cryotherapy, Moist heat, Manual therapy, and Re-evaluation   PLAN FOR NEXT SESSION: assess HEP response, LE strengthening, gait training   Fredderick Phenix, PT 05/23/2023, 9:04 AM

## 2023-05-26 ENCOUNTER — Telehealth (HOSPITAL_COMMUNITY): Payer: Self-pay | Admitting: Psychiatry

## 2023-05-26 ENCOUNTER — Telehealth: Payer: Self-pay | Admitting: Family Medicine

## 2023-05-26 NOTE — Telephone Encounter (Signed)
Mychart message sent to patient.

## 2023-05-26 NOTE — Telephone Encounter (Signed)
Copied from CRM [id number]. Topic: Referral - Status >> May 26, 2023  1:06 PM Macon Large wrote: Reason for CRM: Pt stated she has yet to receive referral to neurology

## 2023-05-27 ENCOUNTER — Telehealth: Payer: Self-pay | Admitting: Family Medicine

## 2023-05-27 DIAGNOSIS — G47419 Narcolepsy without cataplexy: Secondary | ICD-10-CM

## 2023-05-27 NOTE — Addendum Note (Signed)
Addended byHoy Register on: 05/27/2023 06:22 PM   Modules accepted: Orders

## 2023-05-27 NOTE — Telephone Encounter (Signed)
PEC agent asking if labs were drawn on OV 05/13/23. A1c done in office.

## 2023-05-27 NOTE — Telephone Encounter (Signed)
Can order be placed for a drug test?

## 2023-05-27 NOTE — Telephone Encounter (Signed)
Patient notes that she would like to start adderall. She reports that she completed her UDS at [facility]. Provider informed patient that her results are not displayed in epic. She notes that she will call the lab. At this time adderall not started. Patient will continue all other medications as prescribed.

## 2023-05-27 NOTE — Telephone Encounter (Signed)
Copied from CRM [id number]. Topic: General - Other >> May 27, 2023 11:33 AM Dondra Prader A wrote: Reason for CRM: Pt is needing her A1C and drug test results to be sent to Dr. Gretchen Short (behavorial health). Per pt Dr. Doyne Keel was not able to find the results for her last visit with pt PCP on 05/13/23. Please advise. Dr. Doyne Keel phone number: [phone]

## 2023-05-27 NOTE — Telephone Encounter (Signed)
Order has been placed.

## 2023-05-28 ENCOUNTER — Ambulatory Visit: Payer: Commercial Managed Care - HMO | Attending: Family Medicine | Admitting: Family Medicine

## 2023-05-28 ENCOUNTER — Encounter: Payer: Self-pay | Admitting: Family Medicine

## 2023-05-28 ENCOUNTER — Other Ambulatory Visit: Payer: Self-pay

## 2023-05-28 ENCOUNTER — Other Ambulatory Visit: Payer: Self-pay | Admitting: Family Medicine

## 2023-05-28 VITALS — BP 121/83 | HR 79 | Temp 98.0°F | Ht 63.0 in | Wt >= 6400 oz

## 2023-05-28 DIAGNOSIS — G8929 Other chronic pain: Secondary | ICD-10-CM | POA: Diagnosis not present

## 2023-05-28 DIAGNOSIS — M25562 Pain in left knee: Secondary | ICD-10-CM

## 2023-05-28 DIAGNOSIS — M25512 Pain in left shoulder: Secondary | ICD-10-CM

## 2023-05-28 DIAGNOSIS — G47419 Narcolepsy without cataplexy: Secondary | ICD-10-CM | POA: Diagnosis not present

## 2023-05-28 DIAGNOSIS — M25561 Pain in right knee: Secondary | ICD-10-CM | POA: Diagnosis not present

## 2023-05-28 DIAGNOSIS — M25511 Pain in right shoulder: Secondary | ICD-10-CM | POA: Diagnosis not present

## 2023-05-28 MED ORDER — CYCLOBENZAPRINE HCL 10 MG PO TABS
10.0000 mg | ORAL_TABLET | Freq: Every evening | ORAL | 1 refills | Status: DC | PRN
Start: 2023-05-28 — End: 2024-08-15
  Filled 2023-05-28 – 2023-06-03 (×2): qty 30, 30d supply, fill #0

## 2023-05-28 MED ORDER — MELOXICAM 7.5 MG PO TABS
7.5000 mg | ORAL_TABLET | Freq: Every day | ORAL | 1 refills | Status: DC
Start: 2023-05-28 — End: 2024-08-15
  Filled 2023-05-28 – 2023-06-03 (×2): qty 30, 30d supply, fill #0

## 2023-05-28 NOTE — Progress Notes (Signed)
Fall last Saturday, right knee and right hip pain.

## 2023-05-28 NOTE — Patient Instructions (Signed)
Chronic Knee Pain, Adult Chronic knee pain is pain in one or both knees that lasts longer than 3 months. Symptoms of chronic knee pain may include swelling, stiffness, and discomfort. Age-related wear and tear (osteoarthritis) of the knee joint is the most common cause of chronic knee pain. Other possible causes include: A long-term immune-related disease that causes inflammation of the knee (rheumatoid arthritis). This usually affects both knees. Inflammatory arthritis, such as gout or pseudogout. An injury to the knee that causes arthritis. An injury to the knee that damages the ligaments. Ligaments are strong tissues that connect bones to each other. Runner's knee or pain behind the kneecap. Treatment for chronic knee pain depends on the cause. The main treatments for chronic knee pain are physical therapy and weight loss. This condition may also be treated with medicines, injections, a knee sleeve or brace, and by using crutches. Rest, ice, pressure (compression), and elevation, also known as RICE therapy, may also be recommended. Follow these instructions at home: If you have a knee sleeve or brace:  Wear the knee sleeve or brace as told by your health care provider. Remove it only as told by your health care provider. Loosen it if your toes tingle, become numb, or turn cold and blue. Keep it clean. If the sleeve or brace is not waterproof: Do not let it get wet. Remove it if allowed by your health care provider, or cover it with a watertight covering when you take a bath or a shower. Managing pain, stiffness, and swelling     If directed, apply heat to the affected area as often as told by your health care provider. Use the heat source that your health care provider recommends, such as a moist heat pack or a heating pad. If you have a removable knee sleeve or brace, remove it as told by your health care provider. Place a towel between your skin and the heat source. Leave the heat on for  20-30 minutes. Remove the heat if your skin turns bright red. This is especially important if you are unable to feel pain, heat, or cold. You may have a greater risk of getting burned. If directed, put ice on the affected area. To do this: If you have a removable knee sleeve or brace, remove it as told by your health care provider. Put ice in a plastic bag. Place a towel between your skin and the bag. Leave the ice on for 20 minutes, 2-3 times a day. Remove the ice if your skin turns bright red. This is very important. If you cannot feel pain, heat, or cold, you have a greater risk of damage to the area. Move your toes often to reduce stiffness and swelling. Raise (elevate) the injured area above the level of your heart while you are sitting or lying down. Activity Avoid high-impact activities or exercises, such as running, jumping rope, or doing jumping jacks. Follow the exercise plan that your health care provider designed for you. Your health care provider may suggest that you: Avoid activities that make knee pain worse. This may require you to change your exercise routines, sport participation, or job duties. Wear shoes with cushioned soles. Avoid sports that require running and sudden changes in direction. Do physical therapy. Physical therapy is planned to match your needs and abilities. It may include exercises for strength, flexibility, stability, and endurance. Do exercises that increase balance and strength, such as tai chi and yoga. Do not use the injured limb to support your   body weight until your health care provider says that you can. Use crutches as told by your health care provider. Return to your normal activities as told by your health care provider. Ask your health care provider what activities are safe for you. General instructions Take over-the-counter and prescription medicines only as told by your health care provider. Lose weight if you are overweight. Losing even a  little weight can reduce knee pain. Ask your health care provider what your ideal weight is, and how to safely lose extra weight. A dietitian may be able to help you plan your meals. Do not use any products that contain nicotine or tobacco, such as cigarettes, e-cigarettes, and chewing tobacco. These can delay healing. If you need help quitting, ask your health care provider. Keep all follow-up visits. This is important. Contact a health care provider if: You have knee pain that is not getting better or gets worse. You are unable to do your physical therapy exercises due to knee pain. Get help right away if: Your knee swells and the swelling becomes worse. You cannot move your knee. You have severe knee pain. Summary Knee pain that lasts more than 3 months is considered chronic knee pain. The main treatments for chronic knee pain are physical therapy and weight loss. You may also need to take medicines, wear a knee sleeve or brace, use crutches, and apply ice or heat. Losing even a little weight can reduce knee pain. Ask your health care provider what your ideal weight is, and how to safely lose extra weight. A dietitian may be able to help you plan your meals. Follow the exercise plan that your health care provider designed for you. This information is not intended to replace advice given to you by your health care provider. Make sure you discuss any questions you have with your health care provider. Document Revised: 05/16/2020 Document Reviewed: 05/17/2020 Elsevier Patient Education  2024 [employer].
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
## 2023-05-29 ENCOUNTER — Ambulatory Visit: Payer: Commercial Managed Care - HMO | Admitting: Physical Therapy

## 2023-05-30 LAB — DRUG SCREEN 12+ALCOHOL+CRT, UR: Propoxyphene: NEGATIVE ng/mL

## 2023-06-02 ENCOUNTER — Ambulatory Visit: Payer: Commercial Managed Care - HMO | Admitting: Family Medicine

## 2023-06-03 ENCOUNTER — Other Ambulatory Visit: Payer: Self-pay

## 2023-06-04 ENCOUNTER — Encounter: Payer: Self-pay | Admitting: Pharmacist

## 2023-06-04 ENCOUNTER — Other Ambulatory Visit: Payer: Self-pay

## 2023-06-04 LAB — DRUG SCREEN 12+ALCOHOL+CRT, UR
Amphetamines, Urine: POSITIVE — AB
BENZODIAZ UR QL: NEGATIVE ng/mL
Barbiturate: NEGATIVE ng/mL
Cannabinoids: POSITIVE — AB
Cocaine (Metabolite): NEGATIVE ng/mL
Creatinine, Urine: 250.4 mg/dL (ref 20.0–300.0)
Ethanol, Urine: NEGATIVE %
Meperidine: NEGATIVE ng/mL
Methadone: NEGATIVE ng/mL
OPIATE SCREEN URINE: NEGATIVE ng/mL
Oxycodone/Oxymorphone, Urine: NEGATIVE ng/mL
Propoxyphene: NEGATIVE ng/mL
Tramadol: NEGATIVE ng/mL

## 2023-06-05 ENCOUNTER — Ambulatory Visit: Payer: Commercial Managed Care - HMO | Admitting: Physical Therapy

## 2023-06-05 ENCOUNTER — Encounter: Payer: Self-pay | Admitting: Physical Therapy

## 2023-06-05 DIAGNOSIS — M5459 Other low back pain: Secondary | ICD-10-CM

## 2023-06-05 DIAGNOSIS — R600 Localized edema: Secondary | ICD-10-CM

## 2023-06-05 DIAGNOSIS — G8929 Other chronic pain: Secondary | ICD-10-CM | POA: Diagnosis not present

## 2023-06-05 DIAGNOSIS — M25562 Pain in left knee: Secondary | ICD-10-CM | POA: Diagnosis not present

## 2023-06-05 DIAGNOSIS — M6281 Muscle weakness (generalized): Secondary | ICD-10-CM

## 2023-06-05 DIAGNOSIS — M25571 Pain in right ankle and joints of right foot: Secondary | ICD-10-CM | POA: Diagnosis not present

## 2023-06-05 DIAGNOSIS — R262 Difficulty in walking, not elsewhere classified: Secondary | ICD-10-CM

## 2023-06-05 NOTE — Therapy (Signed)
OUTPATIENT PHYSICAL THERAPY TREATMENT NOTE   Patient Name: [name] MRN: [id number] DOB:02-Apr-1991, 32 y.o., female Today's Date: 06/06/2023  PCP: Hoy Register, MD  REFERRING PROVIDER: Persons, [place], [place]   END OF SESSION:   PT End of Session - 06/05/23 1619     Visit Number 14    Number of Visits 17    Date for PT Re-Evaluation 07/05/23    Authorization Type [employer]    Authorization Time Period FOTO v6, v10    PT Start Time 1617    PT Stop Time 1700    PT Time Calculation (min) 43 min    Activity Tolerance Patient tolerated treatment well;Patient limited by pain    Behavior During Therapy [facility] for tasks assessed/performed                  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   ahi 5.5 ,mslt 07/01/06(off meds) mean latency 0 , with 4 sorems npsg 04/24/2010 2.2/hr   Exogenous obesity    Narcolepsy cataplexy syndrome 2007   Prior sleep studies starting on September 26 2005, AHI 5.3 per hour,   Poor sleep hygiene    Past Surgical History:  Procedure Laterality Date   none     Patient Active Problem List   Diagnosis Date Noted   Unilateral primary osteoarthritis, left knee 02/11/2023   Dental abscess 02/21/2022   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 prior episode (HCC) 10/31/2021   OSA (obstructive sleep apnea) 10/15/2021   Depression, major, recurrent, mild (HCC) 04/30/2021   Generalized anxiety disorder 04/30/2021   Migraine 06/02/2017   Intercostal pain 08/25/2016   Knee pain, bilateral 07/14/2016   Venous stasis dermatitis of both lower extremities 05/26/2016   Stye external 03/25/2016   Plantar fasciitis, right 03/25/2016   Leg swelling 11/17/2015   Headache 09/08/2015   Pain of molar 05/26/2015   Sebaceous cyst of left axilla 04/13/2015   Vitamin D deficiency  03/29/2015   Allergic rhinitis 04/11/2010   Iron deficiency anemia 03/09/2010   Esophageal reflux 03/24/2009   MENORRHAGIA 03/17/2009   Morbid obesity with body mass index of 70 and over in adult [facility]) 05/15/2007   Depression 02/12/2007   Attention deficit hyperactivity disorder (ADHD), predominantly inattentive type 02/12/2007   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    REFERRING DIAG:  M25.562,G89.29 (ICD-10-CM) - Chronic pain of left knee M25.571 (ICD-10-CM) - Pain in right ankle and joints of right foot E66.01,Z68.45 (ICD-10-CM) - Morbid obesity with body mass index of 70 and over in adult [facility])  THERAPY DIAG:  Chronic pain of left knee  Muscle weakness (generalized)  Pain in right ankle and joints of right foot  Localized edema  Other low back pain  Difficulty in walking, not elsewhere classified  Rationale for Evaluation and Treatment Rehabilitation  PERTINENT HISTORY: Depression, Asthma, ADHD   PRECAUTIONS: None   SUBJECTIVE:  SUBJECTIVE STATEMENT:    Pt reports continued knee and ankle pain but some improvement since her fall.   PAIN:  Are you having pain?  Yes: NPRS scale: 7/10 Worst: 10/10 Pain location: bilateral knee, L>R Pain description: sharp Aggravating factors: walking, prolonged standing Relieving factors: rest   Are you having pain?  Yes: NPRS scale: 5/10 Worst: 10/10 Pain location: bilateral ankle R>L Pain description: achy Aggravating factors: walking, prolonged standing Relieving factors: rest   OBJECTIVE: (objective measures completed at initial evaluation unless otherwise dated)  DIAGNOSTIC FINDINGS:             See imaging   PATIENT SURVEYS:  FOTO: 40% function; 56% predicted; 04/01/23 34%   COGNITION: Overall cognitive status: Within functional  limits for tasks assessed                         SENSATION: WFL   POSTURE:  large body body habitus and genu varus   PALPATION: TTP to L knee joint line   LOWER EXTREMITY ROM:   Active ROM Right eval Left eval  Hip flexion      Hip extension      Hip abduction      Hip adduction      Hip internal rotation      Hip external rotation      Knee flexion WNL WNL  Knee extension -3 -5  Ankle dorsiflexion      Ankle plantarflexion      Ankle inversion      Ankle eversion       (Blank rows = not tested)   LOWER EXTREMITY MMT:   MMT Right eval Left eval  Hip flexion 3+/5 3+/5  Hip extension      Hip abduction 3+/5 3+/5  Hip adduction      Hip internal rotation      Hip external rotation      Knee flexion 3+/5 3+/5  Knee extension 3+/5 3+/5  Ankle dorsiflexion      Ankle plantarflexion      Ankle inversion      Ankle eversion       (Blank rows = not tested)   LOWER EXTREMITY SPECIAL TESTS:  DNT   FUNCTIONAL TESTS:  30 Second Sit to Stand: 8 reps - with UE TUG: 14" with SPC   GAIT: Distance walked: 35ft Assistive device utilized: Single point cane Level of assistance: Modified independence Comments: wide BoS; antalgic gait L   TREATMENT:  TREATMENT 05/22/23:  Aquatic therapy at [facility]- [address] - therapeutic pool temp 92 degrees Pt enters building independently.  Treatment took place in water 3.8 to  4 ft 8 in.feet deep depending upon activity.  Pt entered and exited the pool via stair and handrails    Aquatic Therapy:  Water walking for warm up fwd/lat/bkwds  Standing: Walking march - 3 laps (fast) Hamstring curl 2x20 BIL Hip abd/add 2x20 BIL Jogging with yellow dumbell punches Curtsy squat Lateral walking with shoulder adduction yellow DB  Hip Circles CC/CCW 2x10 each BIL S/L heel raises - x20 Hurdle hip openers 20x ea way Yellow noodle stomp x20 ea Squats 2x20 Step ups fwd and lat on submerged step 2x10 BIL   Pt requires  the buoyancy of water for active assisted exercises with buoyancy supported for strengthening and AROM exercises. Hydrostatic pressure also supports joints by unweighting joint load by at least 50 % in 3-4 feet depth water. 80% in chest to  neck deep water. Water will provide assistance with movement using the current and laminar flow while the buoyancy reduces weight bearing. Pt requires the viscosity of the water for resistance with strengthening exercises.  TREATMENT 04/18/23:  Aquatic therapy at [facility]- [address] - therapeutic pool temp 92 degrees Pt enters building independently.  Treatment took place in water 3.8 to  4 ft 8 in.feet deep depending upon activity.  Pt entered and exited the pool via stair and handrails    Aquatic Therapy:  Water walking for warm up fwd/lat/bkwds  Standing: Walking march - 3 laps Hamstring curl 2x20 BIL Hip abd/add 2x20 BIL Jogging with yellow dumbell punches Lateral walking with shoulder adduction yellow DB  Hip Circles CC/CCW 2x10 each BIL S/L heel raises - x20 Hurdle hip openers 20x ea way Yellow noodle stomp x20 ea Squats 2x20 Step ups fwd and lat on submerged step 2x10 BIL   Pt requires the buoyancy of water for active assisted exercises with buoyancy supported for strengthening and AROM exercises. Hydrostatic pressure also supports joints by unweighting joint load by at least 50 % in 3-4 feet depth water. 80% in chest to neck deep water. Water will provide assistance with movement using the current and laminar flow while the buoyancy reduces weight bearing. Pt requires the viscosity of the water for resistance with strengthening exercises.    [facility] Adult PT Treatment:                                                DATE: 03/27/23 Therapeutic Exercise: NuStep lvl 4 UE/LE x 8 min  LAQ 2x10 2# B alternating Seated hamstring 2x10 blue band Bil Supine SLR 2x10 Bil Supine SAQs 2x10 Bil 2# STS 2x5, arms crossed and arms extended Standing  heel raise against wall 10x2 Marching against wall 10/10 Hip extension against wall 10/10   [facility] Adult PT Treatment:                                                DATE: 03/25/23 Therapeutic Exercise: NuStep lvl 4 UE/LE x 6 min  LAQ 2x12 2.5# B alternating Seated hamstring 2x12 blue band Bil Supine SLR 2x10 Bil Supine SAQs 2x10 Bil STS 2x5, arms crossed and arms extended Standing heel raise against wall 12x2   [facility] Adult PT Treatment:                                                DATE: 03/20/2023 Therapeutic Exercise: NuStep lvl 4 UE/LE x 4 min while taking subejctive LAQ 2x10 2.5# Seated hamstring 2x10 blue band Supine SLR 3x5 each STS 2x5 Standing hip abd/ext x 10 each Standing heel raise x 10 Gait rolled into therex amb 269ft x 2 with sitting rest break between  [facility] Adult PT Treatment:                                                DATE: 03/18/2023 Therapeutic Exercise: Dorene Grebe  2x10 2.5# Seated hamstring 2x10 blue band Supine SLR 2x5 each Ankle DF/inv x 10 RTB STS 3x5 Standing hip abd 2x5 Standing heel raise x 10  [facility] Adult PT Treatment:                                                DATE: 02/27/2023 Therapeutic Exercise: Quad set x 5 - 5" hold LAQ x 5 each Seated hamstring curl x 10 GTB Seated PF with black band x 10 each   PATIENT EDUCATION:  Education details: eval findings, LEFS, HEP, POC Person educated: Patient Education method: Explanation, Demonstration, and Handouts Education comprehension: verbalized understanding and returned demonstration   HOME EXERCISE PROGRAM: Access Code: [email or web address] URL: [email or web address] Date: 02/27/2023 Prepared by: Edwinna Areola   Exercises - Supine Quadricep Sets  - 1 x daily - 7 x weekly - 2 sets - 10 reps - 5 sec hold - Seated Long Arc Quad  - 1 x daily - 7 x weekly - 3 sets - 10 reps - Seated Hamstring Curl with Anchored Resistance  - 1 x daily - 7 x weekly - 3 sets - 10 reps - green band hold - Seated Ankle  Plantarflexion with Resistance  - 1 x daily - 7 x weekly - 3 sets - 10 reps - black band hold   ASSESSMENT:   CLINICAL IMPRESSION:   Session today focused on LE strengthening and general activity tolerance in the aquatic environment for use of buoyancy to offload joints and the viscosity of water as resistance during therapeutic exercise.  Nessa continues to show improved endurance and decreased pain this visit.  Patient was able to tolerate all prescribed exercises in the aquatic environment with no adverse effects and reports 5/10 pain at the end of the session. Patient continues to benefit from skilled PT services on land and aquatic based and should be progressed as able to improve functional independence.    OBJECTIVE IMPAIRMENTS: Abnormal gait, decreased activity tolerance, decreased balance, decreased endurance, decreased mobility, difficulty walking, decreased strength, and pain.    ACTIVITY LIMITATIONS: sitting, standing, squatting, stairs, transfers, and locomotion level   PARTICIPATION LIMITATIONS: driving, shopping, community activity, yard work, and church   PERSONAL FACTORS: Fitness, Time since onset of injury/illness/exacerbation, and 3+ comorbidities: Depression, Asthma, ADHD   are also affecting patient's functional outcome.    GOALS: Goals reviewed with patient? No   SHORT TERM GOALS: Target date: 03/20/2023   Pt will be compliant and knowledgeable with initial HEP for improved comfort and carryover Baseline: initial HEP given  Goal status: Met   2.  Pt will self report knee and ankle pain no greater than 7/10 for improved comfort and functional ability Baseline: 10/10 at worst; 04/01/23 7/10 knee and 3/10 ankle pain  Goal status: Met   LONG TERM GOALS: Target date: 04/24/2023   Pt will improve FOTO function score to no less than 56% as proxy for functional improvement with home ADLs and community activity Baseline: 40% function; 04/01/23 34% Goal status: INITIAL     2.  Pt will self report knee and ankle pain no greater than 4/10 for improved comfort and functional ability Baseline: 10/10 at worst Goal status: INITIAL    3.  Pt will increase 30 Second Sit to Stand rep count to no less than 10 reps for improved balance, strength, and functional mobility  Baseline: 8 reps - with UE Goal status: INITIAL    4.  Pt will improve TUG to no less than 11" with least restrictive AD for improved comfort and balance Baseline: 14" with SPC Goal status: INITIAL   PLAN:   PT FREQUENCY: 2x/week   PT DURATION: 8 weeks   PLANNED INTERVENTIONS: Therapeutic exercises, Therapeutic activity, Neuromuscular re-education, Balance training, Gait training, Patient/Family education, Self Care, Joint mobilization, Aquatic Therapy, Dry Needling, Electrical stimulation, Cryotherapy, Moist heat, Manual therapy, and Re-evaluation   PLAN FOR NEXT SESSION: assess HEP response, LE strengthening, gait training   Fredderick Phenix, PT 06/06/2023, 8:07 AM

## 2023-06-06 ENCOUNTER — Other Ambulatory Visit: Payer: Self-pay

## 2023-06-09 ENCOUNTER — Other Ambulatory Visit: Payer: Self-pay

## 2023-06-11 NOTE — Therapy (Signed)
OUTPATIENT PHYSICAL THERAPY TREATMENT NOTE   Patient Name: [name] MRN: [id number] DOB:07/19/1991, 32 y.o., female Today's Date: 06/13/2023  PCP: Hoy Register, MD  REFERRING PROVIDER: Persons, [place], [place]   END OF SESSION:   PT End of Session - 06/12/23 1630     Visit Number 15    Number of Visits 17    Date for PT Re-Evaluation 07/05/23    Authorization Type [employer]    Authorization Time Period FOTO v6, v10    PT Start Time 0430   pt arrived late   PT Stop Time 0500    PT Time Calculation (min) 30 min    Activity Tolerance Patient tolerated treatment well;Patient limited by pain    Behavior During Therapy [facility] for tasks assessed/performed                   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   ahi 5.5 ,mslt 07/01/06(off meds) mean latency 0 , with 4 sorems npsg 04/24/2010 2.2/hr   Exogenous obesity    Narcolepsy cataplexy syndrome 2007   Prior sleep studies starting on September 26 2005, AHI 5.3 per hour,   Poor sleep hygiene    Past Surgical History:  Procedure Laterality Date   none     Patient Active Problem List   Diagnosis Date Noted   Unilateral primary osteoarthritis, left knee 02/11/2023   Dental abscess 02/21/2022   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 prior episode (HCC) 10/31/2021   OSA (obstructive sleep apnea) 10/15/2021   Depression, major, recurrent, mild (HCC) 04/30/2021   Generalized anxiety disorder 04/30/2021   Migraine 06/02/2017   Intercostal pain 08/25/2016   Knee pain, bilateral 07/14/2016   Venous stasis dermatitis of both lower extremities 05/26/2016   Stye external 03/25/2016   Plantar fasciitis, right 03/25/2016   Leg swelling 11/17/2015   Headache 09/08/2015   Pain of molar 05/26/2015   Sebaceous cyst of left axilla 04/13/2015   Vitamin D  deficiency 03/29/2015   Allergic rhinitis 04/11/2010   Iron deficiency anemia 03/09/2010   Esophageal reflux 03/24/2009   MENORRHAGIA 03/17/2009   Morbid obesity with body mass index of 70 and over in adult [facility]) 05/15/2007   Depression 02/12/2007   Attention deficit hyperactivity disorder (ADHD), predominantly inattentive type 02/12/2007   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    REFERRING DIAG:  M25.562,G89.29 (ICD-10-CM) - Chronic pain of left knee M25.571 (ICD-10-CM) - Pain in right ankle and joints of right foot E66.01,Z68.45 (ICD-10-CM) - Morbid obesity with body mass index of 70 and over in adult [facility])  THERAPY DIAG:  Chronic pain of left knee  Muscle weakness (generalized)  Pain in right ankle and joints of right foot  Rationale for Evaluation and Treatment Rehabilitation  PERTINENT HISTORY: Depression, Asthma, ADHD   PRECAUTIONS: None   SUBJECTIVE:  SUBJECTIVE STATEMENT:    Pt reports continued knee and ankle pain but some improvement since her fall.   PAIN:  Are you having pain?  Yes: NPRS scale: 7/10 Worst: 10/10 Pain location: bilateral knee, L>R Pain description: sharp Aggravating factors: walking, prolonged standing Relieving factors: rest   Are you having pain?  Yes: NPRS scale: 5/10 Worst: 10/10 Pain location: bilateral ankle R>L Pain description: achy Aggravating factors: walking, prolonged standing Relieving factors: rest   OBJECTIVE: (objective measures completed at initial evaluation unless otherwise dated)  DIAGNOSTIC FINDINGS:             See imaging   PATIENT SURVEYS:  FOTO: 40% function; 56% predicted; 04/01/23 34%   COGNITION: Overall cognitive status: Within functional limits for tasks assessed                         SENSATION: WFL   POSTURE:   large body body habitus and genu varus   PALPATION: TTP to L knee joint line   LOWER EXTREMITY ROM:   Active ROM Right eval Left eval  Hip flexion      Hip extension      Hip abduction      Hip adduction      Hip internal rotation      Hip external rotation      Knee flexion WNL WNL  Knee extension -3 -5  Ankle dorsiflexion      Ankle plantarflexion      Ankle inversion      Ankle eversion       (Blank rows = not tested)   LOWER EXTREMITY MMT:   MMT Right eval Left eval  Hip flexion 3+/5 3+/5  Hip extension      Hip abduction 3+/5 3+/5  Hip adduction      Hip internal rotation      Hip external rotation      Knee flexion 3+/5 3+/5  Knee extension 3+/5 3+/5  Ankle dorsiflexion      Ankle plantarflexion      Ankle inversion      Ankle eversion       (Blank rows = not tested)   LOWER EXTREMITY SPECIAL TESTS:  DNT   FUNCTIONAL TESTS:  30 Second Sit to Stand: 8 reps - with UE TUG: 14" with SPC   GAIT: Distance walked: 30ft Assistive device utilized: Single point cane Level of assistance: Modified independence Comments: wide BoS; antalgic gait L   TREATMENT:  TREATMENT 06/12/23:  Aquatic therapy at [facility]- [address] - therapeutic pool temp 92 degrees Pt enters building independently.  Treatment took place in water 3.8 to  4 ft 8 in.feet deep depending upon activity.  Pt entered and exited the pool via stair and handrails    Aquatic Therapy:  Water walking for warm up fwd/lat/bkwds  Standing: Walking march - 3 laps (fast) Hip abd/add 2x20 BIL Jogging with yellow dumbell punches Hip Circles CC/CCW 2x10 each BIL S/L heel raises - x20 Yellow noodle stomp x20 ea Squats 2x20 Step ups fwd and lat on submerged step 2x10 BIL   Pt requires the buoyancy of water for active assisted exercises with buoyancy supported for strengthening and AROM exercises. Hydrostatic pressure also supports joints by unweighting joint load by at least 50 % in 3-4  feet depth water. 80% in chest to neck deep water. Water will provide assistance with movement using the current and laminar flow while the buoyancy reduces weight  bearing. Pt requires the viscosity of the water for resistance with strengthening exercises.  TREATMENT 04/18/23:  Aquatic therapy at [facility]- [address] - therapeutic pool temp 92 degrees Pt enters building independently.  Treatment took place in water 3.8 to  4 ft 8 in.feet deep depending upon activity.  Pt entered and exited the pool via stair and handrails    Aquatic Therapy:  Water walking for warm up fwd/lat/bkwds  Standing: Walking march - 3 laps Hamstring curl 2x20 BIL Hip abd/add 2x20 BIL Jogging with yellow dumbell punches Lateral walking with shoulder adduction yellow DB  Hip Circles CC/CCW 2x10 each BIL S/L heel raises - x20 Hurdle hip openers 20x ea way Yellow noodle stomp x20 ea Squats 2x20 Step ups fwd and lat on submerged step 2x10 BIL   Pt requires the buoyancy of water for active assisted exercises with buoyancy supported for strengthening and AROM exercises. Hydrostatic pressure also supports joints by unweighting joint load by at least 50 % in 3-4 feet depth water. 80% in chest to neck deep water. Water will provide assistance with movement using the current and laminar flow while the buoyancy reduces weight bearing. Pt requires the viscosity of the water for resistance with strengthening exercises.    [facility] Adult PT Treatment:                                                DATE: 03/27/23 Therapeutic Exercise: NuStep lvl 4 UE/LE x 8 min  LAQ 2x10 2# B alternating Seated hamstring 2x10 blue band Bil Supine SLR 2x10 Bil Supine SAQs 2x10 Bil 2# STS 2x5, arms crossed and arms extended Standing heel raise against wall 10x2 Marching against wall 10/10 Hip extension against wall 10/10   [facility] Adult PT Treatment:                                                DATE: 03/25/23 Therapeutic  Exercise: NuStep lvl 4 UE/LE x 6 min  LAQ 2x12 2.5# B alternating Seated hamstring 2x12 blue band Bil Supine SLR 2x10 Bil Supine SAQs 2x10 Bil STS 2x5, arms crossed and arms extended Standing heel raise against wall 12x2   [facility] Adult PT Treatment:                                                DATE: 03/20/2023 Therapeutic Exercise: NuStep lvl 4 UE/LE x 4 min while taking subejctive LAQ 2x10 2.5# Seated hamstring 2x10 blue band Supine SLR 3x5 each STS 2x5 Standing hip abd/ext x 10 each Standing heel raise x 10 Gait rolled into therex amb 257ft x 2 with sitting rest break between  [facility] Adult PT Treatment:                                                DATE: 03/18/2023 Therapeutic Exercise: LAQ 2x10 2.5# Seated hamstring 2x10 blue band Supine SLR 2x5 each Ankle DF/inv x 10 RTB STS 3x5 Standing hip  abd 2x5 Standing heel raise x 10  [facility] Adult PT Treatment:                                                DATE: 02/27/2023 Therapeutic Exercise: Quad set x 5 - 5" hold LAQ x 5 each Seated hamstring curl x 10 GTB Seated PF with black band x 10 each   PATIENT EDUCATION:  Education details: eval findings, LEFS, HEP, POC Person educated: Patient Education method: Explanation, Demonstration, and Handouts Education comprehension: verbalized understanding and returned demonstration   HOME EXERCISE PROGRAM: Access Code: [email or web address] URL: [email or web address] Date: 02/27/2023 Prepared by: Edwinna Areola   Exercises - Supine Quadricep Sets  - 1 x daily - 7 x weekly - 2 sets - 10 reps - 5 sec hold - Seated Long Arc Quad  - 1 x daily - 7 x weekly - 3 sets - 10 reps - Seated Hamstring Curl with Anchored Resistance  - 1 x daily - 7 x weekly - 3 sets - 10 reps - green band hold - Seated Ankle Plantarflexion with Resistance  - 1 x daily - 7 x weekly - 3 sets - 10 reps - black band hold   ASSESSMENT:   CLINICAL IMPRESSION:   Session today focused on LE strengthening and general  activity tolerance in the aquatic environment for use of buoyancy to offload joints and the viscosity of water as resistance during therapeutic exercise.  Pt arrived late so visit was truncated.  Pt shows continued improvement in knee pain and function following fall.  Will follow up with land based therapy next visit for goal check.  Patient was able to tolerate all prescribed exercises in the aquatic environment with no adverse effects and reports 3/10 pain at the end of the session. Patient continues to benefit from skilled PT services on land and aquatic based and should be progressed as able to improve functional independence.    OBJECTIVE IMPAIRMENTS: Abnormal gait, decreased activity tolerance, decreased balance, decreased endurance, decreased mobility, difficulty walking, decreased strength, and pain.    ACTIVITY LIMITATIONS: sitting, standing, squatting, stairs, transfers, and locomotion level   PARTICIPATION LIMITATIONS: driving, shopping, community activity, yard work, and church   PERSONAL FACTORS: Fitness, Time since onset of injury/illness/exacerbation, and 3+ comorbidities: Depression, Asthma, ADHD   are also affecting patient's functional outcome.    GOALS: Goals reviewed with patient? No   SHORT TERM GOALS: Target date: 03/20/2023   Pt will be compliant and knowledgeable with initial HEP for improved comfort and carryover Baseline: initial HEP given  Goal status: Met   2.  Pt will self report knee and ankle pain no greater than 7/10 for improved comfort and functional ability Baseline: 10/10 at worst; 04/01/23 7/10 knee and 3/10 ankle pain  Goal status: Met   LONG TERM GOALS: Target date: 04/24/2023   Pt will improve FOTO function score to no less than 56% as proxy for functional improvement with home ADLs and community activity Baseline: 40% function; 04/01/23 34% Goal status: INITIAL    2.  Pt will self report knee and ankle pain no greater than 4/10 for improved comfort and  functional ability Baseline: 10/10 at worst Goal status: INITIAL    3.  Pt will increase 30 Second Sit to Stand rep count to no less than 10 reps for improved balance, strength, and functional  mobility Baseline: 8 reps - with UE Goal status: INITIAL    4.  Pt will improve TUG to no less than 11" with least restrictive AD for improved comfort and balance Baseline: 14" with SPC Goal status: INITIAL   PLAN:   PT FREQUENCY: 2x/week   PT DURATION: 8 weeks   PLANNED INTERVENTIONS: Therapeutic exercises, Therapeutic activity, Neuromuscular re-education, Balance training, Gait training, Patient/Family education, Self Care, Joint mobilization, Aquatic Therapy, Dry Needling, Electrical stimulation, Cryotherapy, Moist heat, Manual therapy, and Re-evaluation   PLAN FOR NEXT SESSION: assess HEP response, LE strengthening, gait training   Fredderick Phenix, PT 06/13/2023, 7:01 AM

## 2023-06-12 ENCOUNTER — Ambulatory Visit: Payer: Commercial Managed Care - HMO | Admitting: Physical Therapy

## 2023-06-12 ENCOUNTER — Encounter: Payer: Self-pay | Admitting: Physical Therapy

## 2023-06-12 DIAGNOSIS — M25571 Pain in right ankle and joints of right foot: Secondary | ICD-10-CM

## 2023-06-12 DIAGNOSIS — G8929 Other chronic pain: Secondary | ICD-10-CM | POA: Diagnosis not present

## 2023-06-12 DIAGNOSIS — M6281 Muscle weakness (generalized): Secondary | ICD-10-CM

## 2023-06-12 DIAGNOSIS — M25562 Pain in left knee: Secondary | ICD-10-CM | POA: Diagnosis not present

## 2023-06-12 DIAGNOSIS — M5459 Other low back pain: Secondary | ICD-10-CM | POA: Diagnosis not present

## 2023-06-12 DIAGNOSIS — R600 Localized edema: Secondary | ICD-10-CM | POA: Diagnosis not present

## 2023-06-12 DIAGNOSIS — R262 Difficulty in walking, not elsewhere classified: Secondary | ICD-10-CM | POA: Diagnosis not present

## 2023-06-16 NOTE — Therapy (Deleted)
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   ahi 5.5 ,mslt 07/01/06(off meds) mean latency 0 , with 4 sorems npsg 04/24/2010 2.2/hr   Exogenous obesity    Narcolepsy cataplexy syndrome 2007   Prior sleep studies starting on September 26 2005, AHI 5.3 per hour,   Poor sleep hygiene    Past Surgical History:  Procedure Laterality Date   none     Patient Active Problem List   Diagnosis Date Noted   Unilateral primary osteoarthritis, left knee 02/11/2023   Dental abscess 02/21/2022   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 prior episode (HCC) 10/31/2021   OSA (obstructive sleep apnea) 10/15/2021   Depression, major, recurrent, mild (HCC) 04/30/2021   Generalized anxiety disorder 04/30/2021   Migraine 06/02/2017   Intercostal pain 08/25/2016   Knee pain, bilateral 07/14/2016   Venous stasis dermatitis of both lower extremities 05/26/2016   Stye external 03/25/2016   Plantar fasciitis, right 03/25/2016   Leg swelling 11/17/2015   Headache 09/08/2015   Pain of molar 05/26/2015   Sebaceous cyst of left axilla 04/13/2015   Vitamin D deficiency 03/29/2015   Allergic rhinitis 04/11/2010   Iron deficiency anemia 03/09/2010   Esophageal reflux 03/24/2009   MENORRHAGIA 03/17/2009   Morbid obesity with body mass index of 70 and over in adult [facility]) 05/15/2007   Depression 02/12/2007   Attention deficit hyperactivity disorder (ADHD), predominantly inattentive type 02/12/2007   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  Rationale for Evaluation and Treatment Rehabilitation  PERTINENT HISTORY: Depression, Asthma, ADHD   PRECAUTIONS: None   SUBJECTIVE:                                                                                                                                                                                      SUBJECTIVE STATEMENT:    Pt reports continued knee and ankle pain but some improvement since her fall.  PAIN:  Are you having pain?  Yes: NPRS scale: 7/10 Worst: 10/10 Pain location: bilateral knee, L>R Pain description: sharp Aggravating factors: walking, prolonged standing Relieving factors: rest   Are you having pain?  Yes: NPRS scale: 5/10 Worst: 10/10 Pain location: bilateral ankle R>L Pain description: achy Aggravating factors: walking, prolonged standing Relieving factors: rest   OBJECTIVE: (objective measures completed at initial evaluation unless otherwise dated)  DIAGNOSTIC FINDINGS:             See imaging   PATIENT SURVEYS:  FOTO: 40% function; 56% predicted; 04/01/23 34%   COGNITION: Overall cognitive status: Within functional limits for tasks assessed                         SENSATION: WFL   POSTURE:  large body body habitus and genu varus   PALPATION: TTP to L knee joint line   LOWER EXTREMITY ROM:   Active ROM Right eval Left eval  Hip flexion      Hip extension      Hip abduction      Hip adduction      Hip internal rotation      Hip external rotation      Knee flexion WNL WNL  Knee extension -3 -5  Ankle dorsiflexion      Ankle plantarflexion      Ankle inversion      Ankle eversion       (Blank rows = not tested)   LOWER EXTREMITY MMT:   MMT Right eval Left eval  Hip flexion 3+/5 3+/5   Hip extension      Hip abduction 3+/5 3+/5  Hip adduction      Hip internal rotation      Hip external rotation      Knee flexion 3+/5 3+/5  Knee extension 3+/5 3+/5  Ankle dorsiflexion      Ankle plantarflexion      Ankle inversion      Ankle eversion       (Blank rows = not tested)   LOWER EXTREMITY SPECIAL TESTS:  DNT   FUNCTIONAL TESTS:  30 Second Sit to Stand: 8 reps - with UE TUG: 14" with SPC   GAIT: Distance walked: 44ft Assistive device utilized: Single point cane Level of assistance: Modified independence Comments: wide BoS; antalgic gait L   TREATMENT:  TREATMENT 06/12/23:  Aquatic therapy at [facility] [place]- [address] - therapeutic pool temp 92 degrees Pt enters building independently.  Treatment took place in water 3.8 to  4 ft 8 in.feet deep depending upon activity.  Pt entered and exited the pool via stair and handrails    Aquatic Therapy:  Water walking for warm up fwd/lat/bkwds  Standing: Walking march - 3 laps (fast) Hip abd/add 2x20 BIL Jogging with yellow dumbell punches Hip Circles CC/CCW 2x10 each BIL S/L heel raises - x20 Yellow noodle stomp x20 ea Squats 2x20 Step ups fwd and lat on submerged step 2x10 BIL   Pt requires the buoyancy of water for active assisted exercises with buoyancy supported for strengthening and AROM exercises. Hydrostatic pressure also supports joints by unweighting joint load by at least 50 % in 3-4 feet depth water. 80% in chest to neck deep water. Water will provide assistance with movement using the current and laminar flow while the buoyancy reduces weight bearing. Pt requires the viscosity of the water for resistance with strengthening exercises.  TREATMENT 04/18/23:  Aquatic therapy at [facility]  [place]- [address] - therapeutic pool temp 92 degrees Pt enters building independently.  Treatment took place in water 3.8 to  4 ft 8 in.feet deep depending upon activity.  Pt entered and exited the pool via stair  and handrails    Aquatic Therapy:  Water walking for warm up fwd/lat/bkwds  Standing: Walking march - 3 laps Hamstring curl 2x20 BIL Hip abd/add 2x20 BIL Jogging with yellow dumbell punches Lateral walking with shoulder adduction yellow DB  Hip Circles CC/CCW 2x10 each BIL S/L heel raises - x20 Hurdle hip openers 20x ea way Yellow noodle stomp x20 ea Squats 2x20 Step ups fwd and lat on submerged step 2x10 BIL   Pt requires the buoyancy of water for active assisted exercises with buoyancy supported for strengthening and AROM exercises. Hydrostatic pressure also supports joints by unweighting joint load by at least 50 % in 3-4 feet depth water. 80% in chest to neck deep water. Water will provide assistance with movement using the current and laminar flow while the buoyancy reduces weight bearing. Pt requires the viscosity of the water for resistance with strengthening exercises.    [facility] Adult PT Treatment:                                                DATE: 03/27/23 Therapeutic Exercise: NuStep lvl 4 UE/LE x 8 min  LAQ 2x10 2# B alternating Seated hamstring 2x10 blue band Bil Supine SLR 2x10 Bil Supine SAQs 2x10 Bil 2# STS 2x5, arms crossed and arms extended Standing heel raise against wall 10x2 Marching against wall 10/10 Hip extension against wall 10/10   [facility] Adult PT Treatment:                                                DATE: 03/25/23 Therapeutic Exercise: NuStep lvl 4 UE/LE x 6 min  LAQ 2x12 2.5# B alternating Seated hamstring 2x12 blue band Bil Supine SLR 2x10 Bil Supine SAQs 2x10 Bil STS 2x5, arms crossed and arms extended Standing heel raise against wall 12x2   [facility] Adult PT Treatment:                                                DATE: 03/20/2023 Therapeutic Exercise: NuStep lvl 4 UE/LE x 4 min while taking subejctive LAQ 2x10 2.5# Seated hamstring 2x10 blue band Supine SLR 3x5 each STS 2x5 Standing hip abd/ext x 10 each Standing heel raise x 10 Gait  rolled into therex amb 265ft x 2 with sitting rest break between  [facility] Adult PT Treatment:                                                DATE: 03/18/2023 Therapeutic Exercise: LAQ 2x10 2.5# Seated hamstring 2x10 blue band Supine SLR 2x5 each Ankle DF/inv x 10 RTB STS 3x5 Standing hip abd 2x5 Standing heel raise x 10  [facility] Adult PT Treatment:  DATE: 02/27/2023 Therapeutic Exercise: Quad set x 5 - 5" hold LAQ x 5 each Seated hamstring curl x 10 GTB Seated PF with black band x 10 each   PATIENT EDUCATION:  Education details: eval findings, LEFS, HEP, POC Person educated: Patient Education method: Explanation, Demonstration, and Handouts Education comprehension: verbalized understanding and returned demonstration   HOME EXERCISE PROGRAM: Access Code: [email or web address] URL: [email or web address] Date: 02/27/2023 Prepared by: Edwinna Areola   Exercises - Supine Quadricep Sets  - 1 x daily - 7 x weekly - 2 sets - 10 reps - 5 sec hold - Seated Long Arc Quad  - 1 x daily - 7 x weekly - 3 sets - 10 reps - Seated Hamstring Curl with Anchored Resistance  - 1 x daily - 7 x weekly - 3 sets - 10 reps - green band hold - Seated Ankle Plantarflexion with Resistance  - 1 x daily - 7 x weekly - 3 sets - 10 reps - black band hold   ASSESSMENT:   CLINICAL IMPRESSION:   Session today focused on LE strengthening and general activity tolerance in the aquatic environment for use of buoyancy to offload joints and the viscosity of water as resistance during therapeutic exercise.  Pt arrived late so visit was truncated.  Pt shows continued improvement in knee pain and function following fall.  Will follow up with land based therapy next visit for goal check.  Patient was able to tolerate all prescribed exercises in the aquatic environment with no adverse effects and reports 3/10 pain at the end of the session. Patient continues to benefit from skilled PT  services on land and aquatic based and should be progressed as able to improve functional independence.    OBJECTIVE IMPAIRMENTS: Abnormal gait, decreased activity tolerance, decreased balance, decreased endurance, decreased mobility, difficulty walking, decreased strength, and pain.    ACTIVITY LIMITATIONS: sitting, standing, squatting, stairs, transfers, and locomotion level   PARTICIPATION LIMITATIONS: driving, shopping, community activity, yard work, and church   PERSONAL FACTORS: Fitness, Time since onset of injury/illness/exacerbation, and 3+ comorbidities: Depression, Asthma, ADHD   are also affecting patient's functional outcome.    GOALS: Goals reviewed with patient? No   SHORT TERM GOALS: Target date: 03/20/2023   Pt will be compliant and knowledgeable with initial HEP for improved comfort and carryover Baseline: initial HEP given  Goal status: Met   2.  Pt will self report knee and ankle pain no greater than 7/10 for improved comfort and functional ability Baseline: 10/10 at worst; 04/01/23 7/10 knee and 3/10 ankle pain  Goal status: Met   LONG TERM GOALS: Target date: 06/24/2023   Pt will improve FOTO function score to no less than 56% as proxy for functional improvement with home ADLs and community activity Baseline: 40% function; 04/01/23 34% Goal status: INITIAL    2.  Pt will self report knee and ankle pain no greater than 4/10 for improved comfort and functional ability Baseline: 10/10 at worst Goal status: INITIAL    3.  Pt will increase 30 Second Sit to Stand rep count to no less than 10 reps for improved balance, strength, and functional mobility Baseline: 8 reps - with UE Goal status: INITIAL    4.  Pt will improve TUG to no less than 11" with least restrictive AD for improved comfort and balance Baseline: 14" with SPC Goal status: INITIAL   PLAN:   PT FREQUENCY: 2x/week   PT DURATION: 8 weeks   PLANNED INTERVENTIONS: Therapeutic exercises,  Therapeutic  activity, Neuromuscular re-education, Balance training, Gait training, Patient/Family education, Self Care, Joint mobilization, Aquatic Therapy, Dry Needling, Electrical stimulation, Cryotherapy, Moist heat, Manual therapy, and Re-evaluation   PLAN FOR NEXT SESSION: assess HEP response, LE strengthening, gait training   Hildred Laser, PT 06/16/2023, 1:48 PM

## 2023-06-17 ENCOUNTER — Telehealth: Payer: Self-pay

## 2023-06-17 ENCOUNTER — Ambulatory Visit: Payer: Commercial Managed Care - HMO | Attending: Family Medicine

## 2023-06-17 DIAGNOSIS — M25571 Pain in right ankle and joints of right foot: Secondary | ICD-10-CM | POA: Insufficient documentation

## 2023-06-17 DIAGNOSIS — M6281 Muscle weakness (generalized): Secondary | ICD-10-CM | POA: Insufficient documentation

## 2023-06-17 DIAGNOSIS — G8929 Other chronic pain: Secondary | ICD-10-CM | POA: Insufficient documentation

## 2023-06-17 DIAGNOSIS — M25562 Pain in left knee: Secondary | ICD-10-CM | POA: Insufficient documentation

## 2023-06-17 NOTE — Telephone Encounter (Signed)
TC due to missed visit.  Unable to leave voicemail as mailbox not set up

## 2023-06-24 NOTE — Therapy (Signed)
OUTPATIENT PHYSICAL THERAPY TREATMENT NOTE/DC SUMMARY   Patient Name: [name] MRN: [id number] DOB:09/04/1991, 33 y.o., female Today's Date: 06/26/2023  PCP: Hoy Register, MD  REFERRING PROVIDER: Persons, [place], [place]  PHYSICAL THERAPY DISCHARGE SUMMARY  Visits from Start of Care: 16  Current functional level related to goals / functional outcomes: Goals not met   Remaining deficits: ROM, strength and pain   Education / Equipment: HEP   Patient agrees to discharge. Patient goals were not met. Patient is being discharged due to maximized rehab potential.   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   ahi 5.5 ,mslt 07/01/06(off meds) mean latency 0 , with 4 sorems npsg 04/24/2010 2.2/hr   Exogenous obesity    Narcolepsy cataplexy syndrome 2007   Prior sleep studies starting on September 26 2005, AHI 5.3 per hour,   Poor sleep hygiene    Past Surgical History:  Procedure Laterality Date   none     Patient Active Problem List   Diagnosis Date Noted   Unilateral primary osteoarthritis, left knee 02/11/2023   Dental abscess 02/21/2022   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 prior episode (HCC) 10/31/2021   OSA (obstructive sleep apnea) 10/15/2021   Depression, major, recurrent, mild (HCC) 04/30/2021   Generalized anxiety disorder 04/30/2021    Migraine 06/02/2017   Intercostal pain 08/25/2016   Knee pain, bilateral 07/14/2016   Venous stasis dermatitis of both lower extremities 05/26/2016   Stye external 03/25/2016   Plantar fasciitis, right 03/25/2016   Leg swelling 11/17/2015   Headache 09/08/2015   Pain of molar 05/26/2015   Sebaceous cyst of left axilla 04/13/2015   Vitamin D deficiency 03/29/2015   Allergic rhinitis 04/11/2010   Iron deficiency anemia 03/09/2010   Esophageal reflux 03/24/2009   MENORRHAGIA 03/17/2009   Morbid obesity with body mass index of 70 and over in adult [facility]) 05/15/2007   Depression 02/12/2007   Attention deficit hyperactivity disorder (ADHD), predominantly inattentive type 02/12/2007   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  Yes: NPRS scale: 7/10 Worst: 10/10 Pain location: bilateral knee, L>R Pain description: sharp Aggravating factors: walking, prolonged standing Relieving factors: rest   Are you having pain?  Yes: NPRS scale: 5/10 Worst: 10/10 Pain location: bilateral ankle R>L Pain description: achy Aggravating factors: walking, prolonged standing Relieving factors: rest   OBJECTIVE: (objective measures  completed at initial evaluation unless otherwise dated)  DIAGNOSTIC FINDINGS:             See imaging   PATIENT SURVEYS:  FOTO: 40% function; 56% predicted; 04/01/23 34%; 06/26/23 33%   COGNITION: Overall cognitive status: Within functional limits for tasks assessed                         SENSATION: WFL   POSTURE:  large body body habitus and genu varus   PALPATION: TTP to L knee joint line   LOWER EXTREMITY ROM:   Active ROM Right eval Left eval  Hip flexion      Hip extension      Hip abduction      Hip adduction      Hip internal rotation      Hip external rotation      Knee flexion WNL WNL  Knee extension -3 -5  Ankle dorsiflexion      Ankle plantarflexion      Ankle inversion      Ankle eversion       (Blank rows = not tested)   LOWER EXTREMITY MMT:   MMT Right eval Left eval  Hip flexion 3+/5 3+/5  Hip extension      Hip abduction 3+/5 3+/5  Hip adduction      Hip internal rotation      Hip external rotation      Knee flexion 3+/5 3+/5  Knee extension 3+/5 3+/5  Ankle dorsiflexion      Ankle plantarflexion      Ankle inversion      Ankle eversion       (Blank rows = not tested)   LOWER EXTREMITY SPECIAL TESTS:  DNT   FUNCTIONAL TESTS:  30 Second Sit to Stand: 8 reps - with UE; 9 w/o UE support TUG: 14" with [facility] 06/26/23 14s with and w/o cane   GAIT: Distance walked: 59ft Assistive device utilized: Single point cane Level of assistance: Modified independence Comments: wide BoS; antalgic gait L   TREATMENT: [facility] Adult PT Treatment:                                                DATE: 06/26/23 Therapeutic Exercise: STS 5x Heel/toe 15x FAQs 15x Standing ham curl 15/15 Tandem stance 30s B Self Care:  Discussed and reviewed HEP as well as need to adhere to exercise regimen.  Recommended return to ortho MD if symptoms persist.   TREATMENT 06/12/23:  Aquatic therapy at [facility] [address] - therapeutic pool temp 92  degrees Pt enters building independently.  Treatment took place in water 3.8 to  4 ft 8 in.feet deep depending upon activity.  Pt entered and exited the pool via stair and handrails    Aquatic Therapy:  Water walking for warm up fwd/lat/bkwds  Standing: Walking march - 3 laps (fast) Hip  abd/add 2x20 BIL Jogging with yellow dumbell punches Hip Circles CC/CCW 2x10 each BIL S/L heel raises - x20 Yellow noodle stomp x20 ea Squats 2x20 Step ups fwd and lat on submerged step 2x10 BIL   Pt requires the buoyancy of water for active assisted exercises with buoyancy supported for strengthening and AROM exercises. Hydrostatic pressure also supports joints by unweighting joint load by at least 50 % in 3-4 feet depth water. 80% in chest to neck deep water. Water will provide assistance with movement using the current and laminar flow while the buoyancy reduces weight bearing. Pt requires the viscosity of the water for resistance with strengthening exercises.  TREATMENT 04/18/23:  Aquatic therapy at [facility] [address] - therapeutic pool temp 92 degrees Pt enters building independently.  Treatment took place in water 3.8 to  4 ft 8 in.feet deep depending upon activity.  Pt entered and exited the pool via stair and handrails    Aquatic Therapy:  Water walking for warm up fwd/lat/bkwds  Standing: Walking march - 3 laps Hamstring curl 2x20 BIL Hip abd/add 2x20 BIL Jogging with yellow dumbell punches Lateral walking with shoulder adduction yellow DB  Hip Circles CC/CCW 2x10 each BIL S/L heel raises - x20 Hurdle hip openers 20x ea way Yellow noodle stomp x20 ea Squats 2x20 Step ups fwd and lat on submerged step 2x10 BIL   Pt requires the buoyancy of water for active assisted exercises with buoyancy supported for strengthening and AROM exercises. Hydrostatic pressure also supports joints by unweighting joint load by at least 50 % in 3-4 feet depth water. 80% in chest to neck deep  water. Water will provide assistance with movement using the current and laminar flow while the buoyancy reduces weight bearing. Pt requires the viscosity of the water for resistance with strengthening exercises.    [facility] Adult PT Treatment:                                                DATE: 03/27/23 Therapeutic Exercise: NuStep lvl 4 UE/LE x 8 min  LAQ 2x10 2# B alternating Seated hamstring 2x10 blue band Bil Supine SLR 2x10 Bil Supine SAQs 2x10 Bil 2# STS 2x5, arms crossed and arms extended Standing heel raise against wall 10x2 Marching against wall 10/10 Hip extension against wall 10/10   [facility] Adult PT Treatment:                                                DATE: 03/25/23 Therapeutic Exercise: NuStep lvl 4 UE/LE x 6 min  LAQ 2x12 2.5# B alternating Seated hamstring 2x12 blue band Bil Supine SLR 2x10 Bil Supine SAQs 2x10 Bil STS 2x5, arms crossed and arms extended Standing heel raise against wall 12x2   [facility] Adult PT Treatment:                                                DATE: 03/20/2023 Therapeutic Exercise: NuStep lvl 4 UE/LE x 4 min while taking subejctive LAQ 2x10 2.5# Seated hamstring 2x10 blue band Supine SLR 3x5 each STS 2x5 Standing hip  abd/ext x 10 each Standing heel raise x 10 Gait rolled into therex amb 220ft x 2 with sitting rest break between  [facility] Adult PT Treatment:                                                DATE: 03/18/2023 Therapeutic Exercise: LAQ 2x10 2.5# Seated hamstring 2x10 blue band Supine SLR 2x5 each Ankle DF/inv x 10 RTB STS 3x5 Standing hip abd 2x5 Standing heel raise x 10  [facility] Adult PT Treatment:                                                DATE: 02/27/2023 Therapeutic Exercise: Quad set x 5 - 5" hold LAQ x 5 each Seated hamstring curl x 10 GTB Seated PF with black band x 10 each   PATIENT EDUCATION:  Education details: eval findings, LEFS, HEP, POC Person educated: Patient Education method: Explanation, Demonstration, and  Handouts Education comprehension: verbalized understanding and returned demonstration   HOME EXERCISE PROGRAM: Access Code: [email or web address] URL: [email or web address] Date: 02/27/2023 Prepared by: Edwinna Areola   Exercises - Supine Quadricep Sets  - 1 x daily - 7 x weekly - 2 sets - 10 reps - 5 sec hold - Seated Long Arc Quad  - 1 x daily - 7 x weekly - 3 sets - 10 reps - Seated Hamstring Curl with Anchored Resistance  - 1 x daily - 7 x weekly - 3 sets - 10 reps - green band hold - Seated Ankle Plantarflexion with Resistance  - 1 x daily - 7 x weekly - 3 sets - 10 reps - black band hold   ASSESSMENT:   CLINICAL IMPRESSION: Goals not met, overall functional status unchanged.  Patient has maximized PT benefit.  Recommended f/u with ortho if symptoms persist.   Session today focused on LE strengthening and general activity tolerance in the aquatic environment for use of buoyancy to offload joints and the viscosity of water as resistance during therapeutic exercise.  Pt arrived late so visit was truncated.  Pt shows continued improvement in knee pain and function following fall.  Will follow up with land based therapy next visit for goal check.  Patient was able to tolerate all prescribed exercises in the aquatic environment with no adverse effects and reports 3/10 pain at the end of the session. Patient continues to benefit from skilled PT services on land and aquatic based and should be progressed as able to improve functional independence.    OBJECTIVE IMPAIRMENTS: Abnormal gait, decreased activity tolerance, decreased balance, decreased endurance, decreased mobility, difficulty walking, decreased strength, and pain.    ACTIVITY LIMITATIONS: sitting, standing, squatting, stairs, transfers, and locomotion level   PARTICIPATION LIMITATIONS: driving, shopping, community activity, yard work, and church   PERSONAL FACTORS: Fitness, Time since onset of injury/illness/exacerbation, and 3+  comorbidities: Depression, Asthma, ADHD   are also affecting patient's functional outcome.    GOALS: Goals reviewed with patient? No   SHORT TERM GOALS: Target date: 03/20/2023   Pt will be compliant and knowledgeable with initial HEP for improved comfort and carryover Baseline: initial HEP given  Goal status: Met   2.  Pt will self report knee and ankle pain no greater  than 7/10 for improved comfort and functional ability Baseline: 10/10 at worst; 04/01/23 7/10 knee and 3/10 ankle pain  Goal status: Met   LONG TERM GOALS: Target date: 06/24/2023   Pt will improve FOTO function score to no less than 56% as proxy for functional improvement with home ADLs and community activity Baseline: 40% function; 04/01/23 34%; 06/26/23 33% Goal status: Not met   2.  Pt will self report knee and ankle pain no greater than 4/10 for improved comfort and functional ability Baseline: 10/10 at worst Goal status: Not met   3.  Pt will increase 30 Second Sit to Stand rep count to no less than 10 reps for improved balance, strength, and functional mobility Baseline: 8 reps - with UE; 06/26/23 9 stands no UE assist Goal status: Not met   4.  Pt will improve TUG to no less than 11" with least restrictive AD for improved comfort and balance Baseline: 14" with SPC; 06/26/23 14s w/o cane, 14s w/cane Goal status: Not met   PLAN:   PT FREQUENCY: 2x/week   PT DURATION: 8 weeks   PLANNED INTERVENTIONS: Therapeutic exercises, Therapeutic activity, Neuromuscular re-education, Balance training, Gait training, Patient/Family education, Self Care, Joint mobilization, Aquatic Therapy, Dry Needling, Electrical stimulation, Cryotherapy, Moist heat, Manual therapy, and Re-evaluation   PLAN FOR NEXT SESSION: DC   Hildred Laser, PT 06/26/2023, 6:08 PM

## 2023-06-25 DIAGNOSIS — Z0289 Encounter for other administrative examinations: Secondary | ICD-10-CM

## 2023-06-26 ENCOUNTER — Ambulatory Visit: Payer: Commercial Managed Care - HMO

## 2023-06-26 DIAGNOSIS — M6281 Muscle weakness (generalized): Secondary | ICD-10-CM | POA: Diagnosis not present

## 2023-06-26 DIAGNOSIS — M25562 Pain in left knee: Secondary | ICD-10-CM | POA: Diagnosis not present

## 2023-06-26 DIAGNOSIS — G8929 Other chronic pain: Secondary | ICD-10-CM | POA: Diagnosis not present

## 2023-06-26 DIAGNOSIS — M25571 Pain in right ankle and joints of right foot: Secondary | ICD-10-CM

## 2023-07-02 ENCOUNTER — Other Ambulatory Visit: Payer: Self-pay

## 2023-07-02 ENCOUNTER — Telehealth (INDEPENDENT_AMBULATORY_CARE_PROVIDER_SITE_OTHER): Payer: Commercial Managed Care - HMO | Admitting: Psychiatry

## 2023-07-02 ENCOUNTER — Encounter (HOSPITAL_COMMUNITY): Payer: Self-pay | Admitting: Psychiatry

## 2023-07-02 DIAGNOSIS — F411 Generalized anxiety disorder: Secondary | ICD-10-CM

## 2023-07-02 DIAGNOSIS — F330 Major depressive disorder, recurrent, mild: Secondary | ICD-10-CM

## 2023-07-02 DIAGNOSIS — F900 Attention-deficit hyperactivity disorder, predominantly inattentive type: Secondary | ICD-10-CM | POA: Diagnosis not present

## 2023-07-02 MED ORDER — AMPHETAMINE-DEXTROAMPHETAMINE 20 MG PO TABS
20.0000 mg | ORAL_TABLET | Freq: Every day | ORAL | 0 refills | Status: DC
Start: 2023-07-02 — End: 2023-09-23
  Filled 2023-07-02 – 2023-07-17 (×2): qty 30, 30d supply, fill #0

## 2023-07-02 MED ORDER — HYDROXYZINE HCL 10 MG PO TABS
10.0000 mg | ORAL_TABLET | Freq: Three times a day (TID) | ORAL | 3 refills | Status: DC | PRN
Start: 2023-07-02 — End: 2023-09-23
  Filled 2023-07-02 – 2023-07-17 (×2): qty 90, 30d supply, fill #0

## 2023-07-02 MED ORDER — BUPROPION HCL ER (XL) 300 MG PO TB24
300.0000 mg | ORAL_TABLET | ORAL | 3 refills | Status: DC
Start: 2023-07-02 — End: 2023-09-23
  Filled 2023-07-02 – 2023-07-17 (×2): qty 30, 30d supply, fill #0
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
## 2023-07-09 ENCOUNTER — Other Ambulatory Visit: Payer: Self-pay

## 2023-07-17 ENCOUNTER — Encounter (INDEPENDENT_AMBULATORY_CARE_PROVIDER_SITE_OTHER): Payer: Self-pay | Admitting: Family Medicine

## 2023-07-17 ENCOUNTER — Ambulatory Visit (INDEPENDENT_AMBULATORY_CARE_PROVIDER_SITE_OTHER): Payer: Commercial Managed Care - HMO | Admitting: Family Medicine

## 2023-07-17 ENCOUNTER — Other Ambulatory Visit: Payer: Self-pay

## 2023-07-17 VITALS — BP 123/85 | HR 71 | Temp 98.0°F | Ht 63.0 in | Wt >= 6400 oz

## 2023-07-17 DIAGNOSIS — G4733 Obstructive sleep apnea (adult) (pediatric): Secondary | ICD-10-CM | POA: Diagnosis not present

## 2023-07-17 DIAGNOSIS — F32A Depression, unspecified: Secondary | ICD-10-CM | POA: Diagnosis not present

## 2023-07-17 DIAGNOSIS — F419 Anxiety disorder, unspecified: Secondary | ICD-10-CM

## 2023-07-17 DIAGNOSIS — Z1331 Encounter for screening for depression: Secondary | ICD-10-CM | POA: Diagnosis not present

## 2023-07-17 DIAGNOSIS — N938 Other specified abnormal uterine and vaginal bleeding: Secondary | ICD-10-CM

## 2023-07-17 DIAGNOSIS — R5383 Other fatigue: Secondary | ICD-10-CM | POA: Diagnosis not present

## 2023-07-17 DIAGNOSIS — G43709 Chronic migraine without aura, not intractable, without status migrainosus: Secondary | ICD-10-CM

## 2023-07-17 DIAGNOSIS — Z6841 Body Mass Index (BMI) 40.0 and over, adult: Secondary | ICD-10-CM

## 2023-07-17 DIAGNOSIS — G47419 Narcolepsy without cataplexy: Secondary | ICD-10-CM | POA: Diagnosis not present

## 2023-07-17 DIAGNOSIS — R0602 Shortness of breath: Secondary | ICD-10-CM | POA: Diagnosis not present

## 2023-07-21 ENCOUNTER — Ambulatory Visit: Payer: Self-pay | Admitting: *Deleted

## 2023-07-21 NOTE — Progress Notes (Signed)
Chief Complaint:   OBESITY [name] (MR# [id number]) is a 32 y.o. female who presents for evaluation and treatment of obesity and related comorbidities. Current BMI is Body mass index is 76.7 kg/m. [name] has been struggling with her weight for many years and has been unsuccessful in either losing weight, maintaining weight loss, or reaching her healthy weight goal.  [name] is currently in the action stage of change and ready to dedicate time achieving and maintaining a healthier weight. [name] is interested in becoming our patient and working on intensive lifestyle modifications including (but not limited to) diet and exercise for weight loss.  Patient lives with her mother and brother. She does not work. She drinks regular soda, sweet tea, and juice. She recently quit smoking. She does not exercise. She has not considered weight loss surgery, but she wants a >200 lb lost. She is a stress and boredom eater. She is not on birth control and has irregular menses.   [name]'s habits were reviewed today and are as follows: Her family eats meals together, she thinks her family will eat healthier with her, she struggles with family and or coworkers weight loss sabotage, her desired weight loss is 233+ lbs, she has been heavy most of her life, she started gaining weight at 32 years old, her heaviest weight ever was 432 pounds, she has significant food cravings issues, she snacks frequently in the evenings, she wakes up frequently in the middle of the night to eat, she skips meals frequently, she is frequently drinking liquids with calories, she frequently makes poor food choices, she has problems with excessive hunger, she frequently eats larger portions than normal, and she struggles with emotional eating.  Depression Screen [name]'s Food and Mood (modified PHQ-9) score was 18.  Subjective:   1. Other fatigue [name] admits to daytime somnolence and admits to  waking up still tired. Patient has a history of symptoms of daytime fatigue, morning fatigue, and morning headache. [name] generally gets 3 or 5 hours of sleep per night, and states that she has nightime awakenings. Snoring is present. Apneic episodes are present. Epworth Sleepiness Score is 23. EKG done on 04/29/2023.  2. SOBOE (shortness of breath on exertion) [name] notes increasing shortness of breath with exercising and seems to be worsening over time with weight gain. She notes getting out of breath sooner with activity than she used to. This has not gotten worse recently. [name] denies shortness of breath at rest or orthopnea.  3. OSA (obstructive sleep apnea) Patient's Epworth score is 23. 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 She is on Adderall 20 mg daily.   4. Chronic migraine without aura without status migrainosus, not intractable Patient is using Topamax 50 mg BID. Her migraines are triggered by lack of sleep. Migraines twice a month, unrelated to menses.   5. [name] (dysfunctional uterine bleeding) Patient reports heavy menses that are fairly regular, and lasting 6-7 days. She is not on birth control.   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 Anxiety and depression Patient's PHQ-9 score is 18. She is on Wellbutrin XL 300 mg once daily. She reports poor compliance.   Assessment/Plan:   1. Other fatigue [name] does feel that her weight is causing her energy to be lower than it should be. Fatigue may be related to obesity, depression or many other causes. Labs will be ordered, and in the meanwhile, [name] will focus on self care including making healthy  food choices, increasing physical activity and focusing on stress reduction.  - VITAMIN D 25 Hydroxy (Vit-D Deficiency, Fractures) - TSH - T4, free - Lipid panel - Insulin, random - Hemoglobin A1c - Folate - Comprehensive metabolic panel - Vitamin B12 - CBC with  Differential/Platelet  2. SOBOE (shortness of breath on exertion) [name] does feel that she gets out of breath more easily that she used to when she exercises. [name]'s shortness of breath appears to be obesity related and exercise induced. She has agreed to work on weight loss and gradually increase exercise to treat her exercise induced shortness of breath. Will continue to monitor closely.  3. OSA (obstructive sleep apnea) Patient is to work on improving sleep time to 6 hours per night. Referral was placed to Neurology for CPAP evaluation.   - Ambulatory referral to Neurology  4. Chronic migraine without aura without status migrainosus, not intractable Patient will continue Topamax. Referral was placed to Neurology for evaluation.  - Ambulatory referral to Neurology  5. [name] (dysfunctional uterine bleeding) We will check labs today, and we will follow-up at patient's next visit.   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  - Ambulatory referral to Neurology  7. Anxiety and depression Cognitive behavioral therapy will be setup with Dr. Dewaine Conger, our Bariatric Psychologist.   8. Depression screen [name] had a positive depression screening. Depression is commonly associated with obesity and often results in emotional eating behaviors. We will monitor this closely and work on CBT to help improve the non-hunger eating patterns. Referral to Psychology may be required if no improvement is seen as she continues in our clinic.  9. BMI 70 and over, adult (HCC)  10. Morbid obesity with starting BMI 76.7 [name] is currently in the action stage of change and her goal is to continue with weight loss efforts. I recommend [name] begin the structured treatment plan as follows:  She has agreed to the Category 3 Plan.  100 calorie snack list was given.   Exercise goals: All adults should avoid inactivity. Some physical  activity is better than none, and adults who participate in any amount of physical activity gain some health benefits.   Behavioral modification strategies: increasing lean protein intake, increasing vegetables, increasing water intake, decreasing liquid calories, decreasing eating out, meal planning and cooking strategies, keeping healthy foods in the home, better snacking choices, planning for success, and decreasing junk food.  She was informed of the importance of frequent follow-up visits to maximize her success with intensive lifestyle modifications for her multiple health conditions. She was informed we would discuss her lab results at her next visit unless there is a critical issue that needs to be addressed sooner. [name] agreed to keep her next visit at the agreed upon time to discuss these results.  Objective:   Blood pressure 123/85, pulse 71, temperature 98 F (36.7 C), height 5\' 3"  (1.6 m), weight (!) 433 lb (196.4 kg), SpO2 97%. Body mass index is 76.7 kg/m.  EKG: Normal sinus rhythm, rate 109 BPM.  Indirect Calorimeter completed today shows a VO2 of 334 and a REE of 2304.  Her calculated basal metabolic rate is 5784 thus her basal metabolic rate is better than expected.  General: Cooperative, alert, well developed, in no acute distress. HEENT: Conjunctivae and lids unremarkable. Cardiovascular: Regular rhythm.  Lungs: Normal work of breathing. Neurologic: No focal deficits.   Lab Results  Component Value Date   CREATININE 0.56 (L) 07/17/2023  BUN 7 07/17/2023   NA 140 07/17/2023   K 4.8 07/17/2023   CL 106 07/17/2023   CO2 21 07/17/2023   Lab Results  Component Value Date   ALT 12 07/17/2023   AST 9 07/17/2023   ALKPHOS 118 07/17/2023   BILITOT 0.2 07/17/2023   Lab Results  Component Value Date   HGBA1C 5.9 (H) 07/17/2023   HGBA1C 5.6 08/01/2022   HGBA1C 5.6 01/17/2021   HGBA1C 5.6 12/29/2017   HGBA1C 5.7 01/10/2017   Lab Results  Component Value  Date   INSULIN 19.1 07/17/2023   Lab Results  Component Value Date   TSH 2.140 07/17/2023   Lab Results  Component Value Date   CHOL 164 07/17/2023   HDL 43 07/17/2023   LDLCALC 105 (H) 07/17/2023   TRIG 86 07/17/2023   CHOLHDL 3.8 07/17/2023   Lab Results  Component Value Date   WBC 7.0 07/17/2023   HGB 7.5 (L) 07/17/2023   HCT 29.5 (L) 07/17/2023   MCV 62 (L) 07/17/2023   PLT 483 (H) 07/17/2023   Lab Results  Component Value Date   IRON 11 (L) 02/11/2023   TIBC 414 02/11/2023   FERRITIN 6 (L) 02/11/2023   Attestation Statements:   Reviewed by clinician on day of visit: allergies, medications, problem list, medical history, surgical history, family history, social history, and previous encounter notes.  I have personally spent 45 minutes total time today in preparation, patient care, nutritional counseling and documentation for this visit, including the following: review of clinical lab tests; review of medical tests/procedures/services.    Trude Mcburney, am acting as transcriptionist for Seymour Bars, DO.  I have reviewed the above documentation for accuracy and completeness, and I agree with the above. Seymour Bars DO

## 2023-07-21 NOTE — Telephone Encounter (Signed)
Summary: rx req / oral discomfort   The patient has had tooth discomfort in the front of their mouth  The patient would like to be prescribed something for their oral discomfort  The patient shares that the discomfort has been occurring for multiple weeks  Please contact further when possible           Chief Complaint: tooth pain requesting medication  Symptoms: right front tooth broken, top gum swelling, pain level 8 . Can eat. Headache tylenol not effective. Feverish . No report of temp  Frequency: multiple weeks  Pertinent Negatives: Patient denies facial swelling  Disposition: [] ED /[x] Urgent Care (no appt availability in office) / [] Appointment(In office/virtual)/ []  [facility]/ [] Home Care/ [] Refused Recommended Disposition /[] [facility]/ []  Follow-up with PCP Additional Notes:   Recommended UC or ED due to sx . No available appt. Patient would like a call back from PCP how to proceed. Reports call dentist and unable to get in this week .      Reason for Disposition  [1] SEVERE pain (e.g., excruciating, unable to eat, unable to do any normal activities) AND [2] not improved 2 hours after pain medicine  Answer Assessment - Initial Assessment Questions 1. LOCATION: "Which tooth is hurting?"  (e.g., right-side/left-side, upper/lower, front/back)     Right front tooth  2. ONSET: "When did the toothache start?"  (e.g., hours, days)      Multiple weeks ago  3. SEVERITY: "How bad is the toothache?"  (Scale 1-10; mild, moderate or severe)   - MILD (1-3): doesn't interfere with chewing    - MODERATE (4-7): interferes with chewing, interferes with normal activities, awakens from sleep     - SEVERE (8-10): unable to eat, unable to do any normal activities, excruciating pain        Level 8  4. SWELLING: "Is there any visible swelling of your face?"     Swelling gums  5. OTHER SYMPTOMS: "Do you have any other symptoms?" (e.g., fever)     Headache , right  front tooth broken , feverish  6. PREGNANCY: "Is there any chance you are pregnant?" "When was your last menstrual period?"     na  Protocols used: [facility]

## 2023-07-21 NOTE — Telephone Encounter (Signed)
Spoke with patient . Verified name & DOB  Patient voiced she is not feeling well. Has an appointment with Dentist 08/22/024. Patient would like to be prescribed something for her oral discomfort. Patient asked for a visit with PCP this week VV for 07/22/2023.

## 2023-07-22 ENCOUNTER — Ambulatory Visit (INDEPENDENT_AMBULATORY_CARE_PROVIDER_SITE_OTHER): Payer: Commercial Managed Care - HMO | Admitting: Family Medicine

## 2023-07-22 ENCOUNTER — Telehealth: Payer: Commercial Managed Care - HMO | Admitting: Family Medicine

## 2023-07-22 ENCOUNTER — Other Ambulatory Visit: Payer: Self-pay

## 2023-07-22 DIAGNOSIS — K0889 Other specified disorders of teeth and supporting structures: Secondary | ICD-10-CM

## 2023-07-22 MED ORDER — AMOXICILLIN-POT CLAVULANATE 875-125 MG PO TABS
1.0000 | ORAL_TABLET | Freq: Two times a day (BID) | ORAL | 0 refills | Status: DC
  Filled 2023-07-22: qty 20, 10d supply, fill #0

## 2023-07-22 NOTE — Patient Instructions (Signed)
Dental Pain Dental pain is often a sign that something is wrong with your teeth or gums. It is also something that can occur following dental treatment. If you have dental pain, it is important to contact your dental care provider, especially if the cause of the pain has not been determined. Dental pain may be of varying intensity and can be caused by many things, including: Tooth decay (cavities or caries). Cavities are caused by bacteria that produce acids that irritate the nerve of your tooth, making it sensitive to air and hot or cold temperatures. This eventually causes discomfort or pain. Abscess or infection. Once the bacteria reach the inner part of the tooth (pulp), a bacterial infection (dental abscess) can occur. Pus typically collects at the end of the root of a tooth. Injury. A crack in the tooth. Gum recession exposing the root, and possibly the nerves, of a tooth. Gum (periodontal)disease. Abnormal grinding or clenching. Poor or improper home care. An unknown reason (idiopathic). Your pain may be mild or severe. It may occur when you are: Chewing. Exposed to hot or cold temperatures. Eating or drinking sugary foods or beverages, such as soda or candy. Your pain may be constant, or it may come and go without cause. Follow these instructions at home: The following actions may help to lessen any discomfort that you are feeling before or after getting dental care. Medicines Take over-the-counter and prescription medicines only as told by your dental care provider. If you were prescribed an antibiotic medicine, take it as told by your dental care provider. Do not stop taking the antibiotic even if you start to feel better. Eating and drinking Avoid foods or drinks that cause you pain, such as: Very hot or very cold foods or drinks. Sweet or sugary foods or drinks. Managing pain and swelling  Ice can sometimes be used to reduce pain and swelling, especially if the pain is  following dental treatment. If directed, put ice on the painful area of your face. To do this: Put ice in a plastic bag. Place a towel between your skin and the bag. Leave the ice on for 20 minutes, 2-3 times a day. Remove the ice if your skin turns bright red. This is very important. If you cannot feel pain, heat, or cold, you have a greater risk of damage to the area. Brushing your teeth To keep your mouth and gums healthy, brush your teeth twice a day using a fluoride toothpaste. Use a toothpaste made for sensitive teeth as directed by your dental care provider, especially if the root is exposed. Always brush your teeth with a soft-bristled toothbrush. This will help prevent irritation to your gums. General instructions Floss at least once a day. Do not apply heat to the outside of the face. Gargle with a mixture of salt and water 3-4 times a day or as needed. To make salt water, completely dissolve -1 tsp (3-6 g) of salt in 1 cup (237 mL) of warm water. Keep all follow-up visits. This is important. Contact a dental care provider if: You have any unexplained dental pain. Your pain is not controlled with medicines. Your symptoms get worse. You have new symptoms. Get help right away if: You are unable to open your mouth. You are having trouble breathing or swallowing. You have a fever. You notice that your face, neck, or jaw is swollen. These symptoms may represent a serious problem that is an emergency. Do not wait to see if the symptoms will go  away. Get medical help right away. Call your local emergency services (911 in the [place] Do not drive yourself to the hospital. Summary Dental pain may be caused by many things, including tooth decay and infection. Your pain may be mild or severe. Take over-the-counter and prescription medicines only as told by your dental care provider. Watch your dental pain for any changes. Let your dental care provider know if your symptoms get  worse. This information is not intended to replace advice given to you by your health care provider. Make sure you discuss any questions you have with your health care provider. Document Revised: 09/06/2020 Document Reviewed: 09/06/2020 Elsevier Patient Education  2024 [employer].

## 2023-07-22 NOTE — Progress Notes (Signed)
Virtual Visit via Video Note  I connected with [name], on 07/22/2023 at 8:30 AM by video enabled telemedicine device and verified that I am speaking with the correct person using two identifiers.   Consent: I discussed the limitations, risks, security and privacy concerns of performing an evaluation and management service by telemedicine and the availability of in person appointments. I also discussed with the patient that there may be a patient responsible charge related to this service. The patient expressed understanding and agreed to proceed.   Location of Patient: Home  Location of Provider: Clinic   Persons participating in Telemedicine visit: Pamala A Pearce Dr. Alvis Lemmings     History of Present Illness: [name] is a 32 y.o. year old female  with a history of morbid obesity, Obstructive sleep apnea, anemia secondary to menorrhagia, prediabetes, narcolepsy.    Discussed the use of AI scribe software for clinical note transcription with the patient, who gave verbal consent to proceed.   The patient presents with a toothache and infection in the upper front teeth. The tooth broke a few weeks ago and has been causing pain and headaches. The patient also reports feeling feverish. She has not noticed any discharge or pus from the infected area.  She has an appointment with her dentist in 2 weeks but was advised she needed to be on antibiotics prior to then.      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     amphetamine-dextroamphetamine (ADDERALL) 20 MG tablet Take 1 tablet (20 mg total) by mouth daily. 30 tablet 0   buPROPion (WELLBUTRIN XL) 300 MG 24 hr tablet Take 1 tablet (300 mg total) by mouth every morning. 30 tablet 3   cetirizine (ZYRTEC) 10 MG tablet Take 1 tablet (10 mg total) by mouth daily. 30 tablet 2   cyclobenzaprine (FLEXERIL) 10 MG tablet Take 1 tablet (10 mg total) by mouth at bedtime as needed for muscle spasms. 30 tablet 1   famotidine (PEPCID) 20 MG tablet Take 1 tablet (20 mg total) by mouth daily. 30 tablet 0   ferrous sulfate 325 (65 FE) MG tablet Take 1 tablet (325 mg total) by mouth daily. 30 tablet 0   gabapentin (NEURONTIN) 300 MG capsule Take 1 capsule (300 mg total) by mouth 3 (three) times daily. 90 capsule 2   hydrOXYzine (ATARAX) 10 MG tablet Take 1 tablet (10 mg total) by mouth 3 (three)  times daily as needed. 90 tablet 3   meloxicam (MOBIC) 7.5 MG tablet Take 1 tablet (7.5 mg total) by mouth daily. 30 tablet 1   omeprazole (PRILOSEC) 40 MG capsule Take 1 capsule (40 mg total) by mouth daily. 90 capsule 1   prochlorperazine (COMPAZINE) 10 MG tablet Take 1 tablet (10 mg total) by mouth 2 (two) times daily as needed for nausea or vomiting. 10 tablet 0   topiramate (TOPAMAX) 50 MG tablet Take 1 tablet (50 mg total) by mouth 2 (two) times daily. 60 tablet 3   tranexamic acid (LYSTEDA) 650 MG TABS tablet Take 2 tablets (1,300 mg total) by mouth 3 (three) times daily. Take during menses for a maximum of five days 60 tablet 2   Vitamin D, Ergocalciferol, (DRISDOL) 1.25 MG (50000 UNIT) CAPS capsule Take 1 capsule (50,000 Units total) by  mouth every 7 (seven) days. 16 capsule 0   No current facility-administered medications on file prior to visit.    ROS: See HPI  Observations/Objective: Awake, alert, oriented x3 Not in acute distress Oral cavity with partially broken front incisural.  Unable to identify abscess or drainage through the camera Normal mood      Latest Ref Rng & Units 07/17/2023   10:54 AM 04/30/2023    9:16 AM 04/29/2023   10:53 PM  CMP  Glucose 70 - 99 mg/dL 95   474   BUN 6 - 20 mg/dL 7   9   Creatinine 2.59 - 1.00 mg/dL 5.63   8.75   Sodium 643 - 144 mmol/L 140   136   Potassium 3.5 - 5.2 mmol/L 4.8   4.0   Chloride 96 - 106 mmol/L 106   105   CO2 20 - 29 mmol/L 21   23   Calcium 8.7 - 10.2 mg/dL 9.1   8.7   Total Protein 6.0 - 8.5 g/dL 7.5  6.7    Total Bilirubin 0.0 - 1.2 mg/dL 0.2  0.5    Alkaline Phos 44 - 121 IU/L 118  93    AST 0 - 40 IU/L 9  13    ALT 0 - 32 IU/L 12  15      Lipid Panel     Component Value Date/Time   CHOL 164 07/17/2023 1054   TRIG 86 07/17/2023 1054   HDL 43 07/17/2023 1054   CHOLHDL 3.8 07/17/2023 1054   LDLCALC 105 (H) 07/17/2023 1054   LABVLDL 16 07/17/2023 1054    Lab Results  Component Value Date   HGBA1C 5.9 (H) 07/17/2023     Assessment and Plan:     Dental Infection Pain and swelling in the upper front tooth region with associated headache and fever. Tooth broke a few weeks ago. No discharge or pus noted. Dental appointment scheduled for 08/12/2023. -Prescribe Augmentin, to be taken twice daily for 10 days. -Advise gargling with warm salt water for symptomatic relief. -Inform dentist about the initiation of antibiotics and inquire about the possibility of moving the appointment up.        Follow Up Instructions: Keep previously scheduled appointment   I discussed the assessment and treatment plan with the patient. The patient was provided an opportunity to ask questions and all were answered. The patient agreed with the plan and  demonstrated an understanding of the instructions.   The patient was advised to call back or seek an in-person evaluation if the symptoms worsen or if the condition fails to improve as anticipated.  I provided 11 minutes total of Telehealth time during this encounter including median intraservice time, reviewing previous notes, investigations, ordering medications, medical decision making, coordinating care and patient verbalized understanding at the end of the visit.     Hoy Register, MD, FAAFP. [facility] [place], [place] [phone]   07/22/2023, 8:30 AM

## 2023-07-23 ENCOUNTER — Other Ambulatory Visit: Payer: Self-pay

## 2023-07-31 ENCOUNTER — Other Ambulatory Visit: Payer: Self-pay

## 2023-07-31 ENCOUNTER — Encounter (INDEPENDENT_AMBULATORY_CARE_PROVIDER_SITE_OTHER): Payer: Self-pay | Admitting: Family Medicine

## 2023-07-31 ENCOUNTER — Ambulatory Visit (INDEPENDENT_AMBULATORY_CARE_PROVIDER_SITE_OTHER): Payer: Commercial Managed Care - HMO | Admitting: Family Medicine

## 2023-07-31 VITALS — BP 115/65 | HR 90 | Temp 98.5°F | Ht 63.0 in | Wt >= 6400 oz

## 2023-07-31 DIAGNOSIS — D500 Iron deficiency anemia secondary to blood loss (chronic): Secondary | ICD-10-CM

## 2023-07-31 DIAGNOSIS — E559 Vitamin D deficiency, unspecified: Secondary | ICD-10-CM | POA: Diagnosis not present

## 2023-07-31 DIAGNOSIS — R632 Polyphagia: Secondary | ICD-10-CM | POA: Diagnosis not present

## 2023-07-31 DIAGNOSIS — G4733 Obstructive sleep apnea (adult) (pediatric): Secondary | ICD-10-CM | POA: Diagnosis not present

## 2023-07-31 DIAGNOSIS — R7303 Prediabetes: Secondary | ICD-10-CM

## 2023-07-31 DIAGNOSIS — G47419 Narcolepsy without cataplexy: Secondary | ICD-10-CM | POA: Diagnosis not present

## 2023-07-31 DIAGNOSIS — Z6841 Body Mass Index (BMI) 40.0 and over, adult: Secondary | ICD-10-CM

## 2023-07-31 MED ORDER — VITAMIN D (ERGOCALCIFEROL) 1.25 MG (50000 UNIT) PO CAPS
50000.0000 [IU] | ORAL_CAPSULE | ORAL | 0 refills | Status: DC
Start: 2023-07-31 — End: 2023-09-02
  Filled 2023-07-31 – 2023-08-14 (×2): qty 8, 28d supply, fill #0

## 2023-07-31 MED ORDER — WEGOVY 0.25 MG/0.5ML ~~LOC~~ SOAJ
0.2500 mg | SUBCUTANEOUS | 0 refills | Status: DC
Start: 2023-07-31 — End: 2023-09-24
  Filled 2023-07-31: qty 2, 28d supply, fill #0

## 2023-07-31 NOTE — Assessment & Plan Note (Signed)
>>  ASSESSMENT AND PLAN FOR IRON  DEFICIENCY ANEMIA WRITTEN ON 07/31/2023  3:23 PM BY BOWEN, KAREN E, DO  Reviewed labs.  She has a profound IDA with a Hgb of 7.5, stable due to DUB- not currently seeing OB/ gyn.  She c/o fatigue.  She is hemodynamically stable and required a blood transfusion last year.  Will cc her PCP to see if she can connect her to an OB for further eval and treatment of DUB and/ or proceed with an IV iron  infusion.  She has NOT been taking her oral iron  due to abdominal pain SE.

## 2023-07-31 NOTE — Progress Notes (Signed)
Office: [phone]  /  Fax: [phone]  WEIGHT SUMMARY AND BIOMETRICS  Starting Date: 07/17/23  Starting Weight: 433lb   Weight Lost Since Last Visit: 17lb   Vitals Temp: 98.5 F (36.9 C) BP: 115/65 Pulse Rate: 90 SpO2: 98 %   Body Composition  Body Fat %: 65.5 % Fat Mass (lbs): 272.4 lbs Muscle Mass (lbs): 136.4 lbs Visceral Fat Rating : 33   HPI  Chief Complaint: OBESITY  [name] is here to discuss her progress with her obesity treatment plan. She is on the the Category 3 Plan and states she is following her eating plan approximately 40 % of the time. She states she is exercising 0 minutes 0 times per week.   Interval History:  Since last office visit she is down 17 lb She has not been eating sides with lunch and she as not eating toast or [employer] with breakfast Buying groceries and cooking for her mom have been a struggle with financial strain and poor support.  She has food stamps. She feels hungry throughout the day She continues to consume SSBs. She is not eating on plan for dinner but she has been more mindful. She is interested in trying medication for her weight  Pharmacotherapy: none  PHYSICAL EXAM:  Blood pressure 115/65, pulse 90, temperature 98.5 F (36.9 C), height 5\' 3"  (1.6 m), weight (!) 416 lb (188.7 kg), SpO2 98%. Body mass index is 73.69 kg/m.  General: She is overweight, cooperative, alert, well developed, and in no acute distress. PSYCH: Has normal mood, affect and thought process.   Lungs: Normal breathing effort, no conversational dyspnea.   ASSESSMENT AND PLAN  TREATMENT PLAN FOR OBESITY:  Recommended Dietary Goals  [name] is currently in the action stage of change. As such, her goal is to continue weight management plan. She has agreed to the Category 3 Plan. - copy of cat 3 meal plan given and reviewed the foods that she has not been eating leading to an increase in hunger.  We looked at food prices online  and recommended shopping for the cheapest prices, buying on sale and using Food Stamps to help. - recommend healthier items like chicken sausage or [place] keilbasa in place of pork/ beef hot dogs, frozen veggies, beans for cost savings - she CAN have ONE starch with dinner (she was eating more than this and was still seeing adequate weight loss.  She worries about feeling hungry without this - she will reduce her soda intake  Behavioral Intervention  We discussed the following Behavioral Modification Strategies today: increasing lean protein intake, decreasing simple carbohydrates , increasing vegetables, increasing lower glycemic fruits, increasing water intake, work on meal planning and preparation, keeping healthy foods at home, continue to practice mindfulness when eating, and planning for success.  Additional resources provided today: NA  Recommended Physical Activity Goals  [name] has been advised to work up to 150 minutes of moderate intensity aerobic activity a week and strengthening exercises 2-3 times per week for cardiovascular health, weight loss maintenance and preservation of muscle mass.   She has agreed to Think about ways to increase daily physical activity and overcoming barriers to exercise  Pharmacotherapy changes for the treatment of obesity: begin Wegovyh 0.25 mg weekly injection  ASSOCIATED CONDITIONS ADDRESSED TODAY  Polyphagia Assessment & Plan: Polyphagia is multi-factorial due to : hyperinsulinemia (reviewed lab findings together), not eating everything at mealtime on plan, and continued consumption of SSBs stimulating appetite.  She is interested in starting [facility]  while working on improving compliance on plan and reducing SSBs.  We discussed MOA, potential Ses and use of the [facility] pen.  Avoid pregnancy while on Wegovy. Patient denies a personal or family history of pancreatitis, medullary thyroid carcinoma or multiple endocrine neoplasia type II. Recommend  reviewing pen training video online.    Orders: -     Wegovy; Inject 0.25 mg into the skin once a week.  Dispense: 2 mL; Refill: 0  OSA (obstructive sleep apnea) Assessment & Plan: She is not on CPAP due to money per her report.  Her polysomnogram was in 2022 with Dr Maple Hudson and she c/o fatigue likely from untreated OSA, narcolepsy and profound IDA.  She has started her active plan for weight reduction.     Orders: -     Ambulatory referral to Pulmonology  Narcolepsy without cataplexy -     Ambulatory referral to Pulmonology  Vitamin D deficiency Assessment & Plan: Last vitamin D Lab Results  Component Value Date   VD25OH 11.1 (L) 07/17/2023   Reviewed lab results and discussed what happens with low vitamin D - fatigue, poor immune function, poor bone health and leptin resistance.    Begin RX vitamin D 2 x a week and recheck level in 3 mos  Orders: -     Vitamin D (Ergocalciferol); Take 1 capsule (50,000 Units total) by mouth 2 (two) times a week.  Dispense: 10 capsule; Refill: 0  Morbid obesity with starting BMI 76.7  BMI 70 and over, adult [facility])  Prediabetes Assessment & Plan: Lab Results  Component Value Date   HGBA1C 5.9 (H) 07/17/2023   Reviewed lab results.  She is at risk for T2DM with a BMI of 73 + fam hx of T2DM and is currently prediabetic.  She reports poor compliance on metformin in the past.  She has started to see weight loss and is working on dietary changes.  Continue active plan for weight loss.  Anticipate improved A1c numbers while on Wegovy for obesity.   Iron deficiency anemia due to chronic blood loss Assessment & Plan: Reviewed labs.  She has a profound IDA with a Hgb of 7.5, stable due to DUB- not currently seeing OB/ gyn.  She c/o fatigue.  She is hemodynamically stable and required a blood transfusion last year.  Will cc her PCP to see if she can connect her to an OB for further eval and treatment of DUB and/ or proceed with an IV iron  infusion.  She has NOT been taking her oral iron due to abdominal pain SE.       She was informed of the importance of frequent follow up visits to maximize her success with intensive lifestyle modifications for her multiple health conditions.   ATTESTASTION STATEMENTS:  Reviewed by clinician on day of visit: allergies, medications, problem list, medical history, surgical history, family history, social history, and previous encounter notes pertinent to obesity diagnosis.   I have personally spent 30 minutes total time today in preparation, patient care, nutritional counseling and documentation for this visit, including the following: review of clinical lab tests; review of medical tests/procedures/services.      Glennis Brink, DO DABFM, DABOM [facility] [address] [place], [place] [address] [phone]

## 2023-07-31 NOTE — Assessment & Plan Note (Signed)
Last vitamin D Lab Results  Component Value Date   VD25OH 11.1 (L) 07/17/2023   Reviewed lab results and discussed what happens with low vitamin D - fatigue, poor immune function, poor bone health and leptin resistance.    Begin RX vitamin D 2 x a week and recheck level in 3 mos

## 2023-07-31 NOTE — Assessment & Plan Note (Signed)
She is not on CPAP due to money per her report.  Her polysomnogram was in 2022 with Dr Maple Hudson and she c/o fatigue likely from untreated OSA, narcolepsy and profound IDA.  She has started her active plan for weight reduction.

## 2023-07-31 NOTE — Assessment & Plan Note (Signed)
Lab Results  Component Value Date   HGBA1C 5.9 (H) 07/17/2023   Reviewed lab results.  She is at risk for T2DM with a BMI of 73 + fam hx of T2DM and is currently prediabetic.  She reports poor compliance on metformin in the past.  She has started to see weight loss and is working on dietary changes.  Continue active plan for weight loss.  Anticipate improved A1c numbers while on Wegovy for obesity.

## 2023-07-31 NOTE — Assessment & Plan Note (Signed)
Polyphagia is multi-factorial due to : hyperinsulinemia (reviewed lab findings together), not eating everything at mealtime on plan, and continued consumption of SSBs stimulating appetite.  She is interested in starting [place] while working on improving compliance on plan and reducing SSBs.  We discussed MOA, potential Ses and use of the [facility] pen.  Avoid pregnancy while on Wegovy. Patient denies a personal or family history of pancreatitis, medullary thyroid carcinoma or multiple endocrine neoplasia type II. Recommend reviewing pen training video online.

## 2023-07-31 NOTE — Assessment & Plan Note (Signed)
Reviewed labs.  She has a profound IDA with a Hgb of 7.5, stable due to DUB- not currently seeing OB/ gyn.  She c/o fatigue.  She is hemodynamically stable and required a blood transfusion last year.  Will cc her PCP to see if she can connect her to an OB for further eval and treatment of DUB and/ or proceed with an IV iron infusion.  She has NOT been taking her oral iron due to abdominal pain SE.

## 2023-08-01 ENCOUNTER — Other Ambulatory Visit: Payer: Self-pay

## 2023-08-01 ENCOUNTER — Encounter: Payer: Self-pay | Admitting: Internal Medicine

## 2023-08-04 ENCOUNTER — Other Ambulatory Visit: Payer: Self-pay

## 2023-08-05 ENCOUNTER — Other Ambulatory Visit: Payer: Self-pay

## 2023-08-11 ENCOUNTER — Other Ambulatory Visit: Payer: Self-pay

## 2023-08-14 ENCOUNTER — Encounter: Payer: Self-pay | Admitting: Internal Medicine

## 2023-08-14 ENCOUNTER — Other Ambulatory Visit: Payer: Self-pay

## 2023-08-15 ENCOUNTER — Other Ambulatory Visit: Payer: Self-pay

## 2023-08-19 ENCOUNTER — Ambulatory Visit: Payer: Commercial Managed Care - HMO | Attending: Family Medicine | Admitting: Family Medicine

## 2023-08-19 ENCOUNTER — Telehealth (INDEPENDENT_AMBULATORY_CARE_PROVIDER_SITE_OTHER): Payer: Commercial Managed Care - HMO | Admitting: Psychology

## 2023-08-19 VITALS — BP 124/77 | HR 89 | Ht 63.0 in | Wt >= 6400 oz

## 2023-08-19 DIAGNOSIS — N920 Excessive and frequent menstruation with regular cycle: Secondary | ICD-10-CM | POA: Diagnosis not present

## 2023-08-19 DIAGNOSIS — F411 Generalized anxiety disorder: Secondary | ICD-10-CM | POA: Diagnosis not present

## 2023-08-19 DIAGNOSIS — F330 Major depressive disorder, recurrent, mild: Secondary | ICD-10-CM | POA: Diagnosis not present

## 2023-08-19 DIAGNOSIS — F909 Attention-deficit hyperactivity disorder, unspecified type: Secondary | ICD-10-CM | POA: Diagnosis not present

## 2023-08-19 DIAGNOSIS — F900 Attention-deficit hyperactivity disorder, predominantly inattentive type: Secondary | ICD-10-CM

## 2023-08-19 DIAGNOSIS — F5089 Other specified eating disorder: Secondary | ICD-10-CM

## 2023-08-19 DIAGNOSIS — Z23 Encounter for immunization: Secondary | ICD-10-CM

## 2023-08-19 DIAGNOSIS — Z6841 Body Mass Index (BMI) 40.0 and over, adult: Secondary | ICD-10-CM
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
## 2023-08-19 NOTE — Patient Instructions (Addendum)
Please call GYN for an appointment - Dr Vanessa Kick Placed in [facility]  [address]  [place], [place] [address] [phone]

## 2023-08-19 NOTE — Progress Notes (Addendum)
Office: [phone]  /  Fax: [phone]    Date: August 19, 2023    Appointment Start Time: 12:02pm Duration: 51 minutes Provider: Lawerance Cruel, Psy.D. Type of Session: Intake for Individual Therapy  Location of Patient: Home (private location) Location of Provider: Provider's home (private office) Type of Contact: Telepsychological Visit via MyChart Video Visit  Informed Consent: Prior to proceeding with today's appointment, two pieces of identifying information were obtained. In addition, [name]'s physical location at the time of this appointment was obtained as well a phone number she could be reached at in the event of technical difficulties. [name] and this provider participated in today's telepsychological service.   The provider's role was explained to [name] A Rochon. The provider reviewed and discussed issues of confidentiality, privacy, and limits therein (e.g., reporting obligations). In addition to verbal informed consent, written informed consent for psychological services was obtained prior to the initial appointment. Since the clinic is not a 24/7 crisis center, mental health emergency resources were shared and this  provider explained MyChart, e-mail, voicemail, and/or other messaging systems should be utilized only for non-emergency reasons. This provider also explained that information obtained during appointments will be placed in [name]'s medical record and relevant information will be shared with other providers at [facility] at any locations for coordination of care. [name] agreed information may be shared with other [facility] providers as needed for coordination of care and by signing the service agreement document, she provided written consent for coordination of care. Prior to initiating telepsychological services, [name] completed an informed consent document, which included the development of a safety plan  (i.e., an emergency contact and emergency resources) in the event of an emergency/crisis. [name] verbally acknowledged understanding she is ultimately responsible for understanding her insurance benefits for telepsychological and in-person services. This provider also reviewed confidentiality, as it relates to telepsychological services. [name]  acknowledged understanding that appointments cannot be recorded without both party consent and she is aware she is responsible for securing confidentiality on her end of the session. [name] verbally consented to proceed.  Of note, [name] was disconnected from our virtual appointment; therefore, today's appointment proceeded via telephone at 12:13pm with her verbal consent.   Chief Complaint/HPI: [name] was referred by Dr. Seymour [name] due to  anxiety and depression . Per the note for the visit with Dr. Seymour [name] on 07/17/2023, "Patient's PHQ-9 score is 18. She is on Wellbutrin XL 300 mg once daily. She reports poor compliance."   During today's appointment, [name] was verbally administered a questionnaire assessing various behaviors related to emotional eating behaviors. [name] endorsed the following: experience food cravings on a regular basis, overeat frequently when you are bored or lonely, and not worry about what you eat when you are in a good mood. She shared she craves "meat" and "sometimes sweets." [name] stated she is not sure regarding the onset of emotional eating behaviors. She described the current frequency of emotional eating behaviors as "daily." In addition, [name] denied a history of binge eating behaviors. [name] denied a history of significantly restricting food intake, purging and engagement in other compensatory strategies for weight loss, and has never been diagnosed with an eating disorder. She also denied a history of treatment for emotional eating behaviors. Recently, [name] indicated she lost 17  pounds. Furthermore, [name] reported she is currently experiencing symptoms of anxiety and depression.   Mental Status Examination:  Appearance: neat Behavior: appropriate to circumstances Mood: depressed Affect: mood congruent Speech: WNL  Eye Contact: appropriate Psychomotor Activity: WNL Gait: unable to assess  Thought Process: linear, logical, and goal directed and denies suicidal, homicidal, and self-harm ideation, plan and intent  Thought Content/Perception: no hallucinations, delusions, bizarre thinking or behavior endorsed or observed Orientation: AAOx4 Memory/Concentration: intact Insight/Judgment: fair  Family & Psychosocial History: [name] reported she is not in a relationship and she does not have any children. She indicated she is currently unemployed. Additionally, [name] shared her highest level of education obtained is a high school diploma. Currently, [name]'s social support system consists of her mother, sisters, and father. Moreover, [name] stated she resides with her mother.   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     amoxicillin-clavulanate (AUGMENTIN) 875-125 MG tablet  Take 1 tablet by mouth 2 (two) times daily. 20 tablet 0   amphetamine-dextroamphetamine (ADDERALL) 20 MG tablet Take 1 tablet (20 mg total) by mouth daily. 30 tablet 0   buPROPion (WELLBUTRIN XL) 300 MG 24 hr tablet Take 1 tablet (300 mg total) by mouth every morning. 30 tablet 3   cetirizine (ZYRTEC) 10 MG tablet Take 1 tablet (10 mg total) by mouth daily. 30 tablet 2   cyclobenzaprine (FLEXERIL) 10 MG tablet Take 1 tablet (10 mg total) by mouth at bedtime as needed for muscle spasms. 30 tablet 1   famotidine (PEPCID) 20 MG tablet Take 1 tablet (20 mg total) by mouth daily. 30 tablet 0   ferrous sulfate 325 (65 FE) MG tablet Take 1 tablet (325 mg total) by mouth daily. 30 tablet 0   gabapentin (NEURONTIN) 300 MG capsule Take 1 capsule (300 mg total) by mouth 3 (three) times daily. 90 capsule 2   hydrOXYzine (ATARAX) 10 MG tablet Take 1 tablet (10 mg total) by mouth 3 (three) times daily as needed. 90 tablet 3   meloxicam (MOBIC) 7.5 MG tablet Take 1 tablet (7.5 mg total) by mouth daily. 30 tablet 1   omeprazole (PRILOSEC) 40 MG capsule Take 1 capsule (40 mg total) by mouth daily. 90 capsule 1   prochlorperazine (COMPAZINE) 10 MG tablet Take 1 tablet (10 mg total) by mouth 2 (two) times daily as needed for nausea or vomiting. 10 tablet 0   Semaglutide-Weight Management (WEGOVY) 0.25 MG/0.5ML SOAJ Inject 0.25 mg into the skin once a week. 2 mL 0   topiramate (TOPAMAX) 50 MG tablet Take 1 tablet (50 mg total) by mouth 2 (two) times daily. 60 tablet 3   tranexamic acid (LYSTEDA)  650 MG TABS tablet Take 2 tablets (1,300 mg total) by mouth 3 (three) times daily. Take during menses for a maximum of five days 60 tablet 2   Vitamin D, Ergocalciferol, (DRISDOL) 1.25 MG (50000 UNIT) CAPS capsule Take 1 capsule (50,000 Units total) by mouth 2 (two) times a week. 10 capsule 0   No current facility-administered medications on file prior to visit.  Veryl stated she is medication compliant.   Mental Health  History: Christionna reported a history of therapeutic services, noting the last time was "maybe couple years." She stated she meets with Toy Cookey, NP for medication management, noting NP Doyne Keel is currently prescribing all psychotropic medications. Lakysha reported there is no history of hospitalizations for psychiatric concerns. Alondra endorsed a family history of depression (mother and father), substance abuse (father), and alcoholism (father). Furthermore, Selena disclosed she has a history of trauma, but expressed desire to not discuss further. Notably, she denied current safety concerns.   Trachelle disclosed a history of suicidal ideation, noting she is unsure when the thoughts started. She denied a history of suicidal plan and intent. She last experienced suicidal ideation in May 2024. The following protective factors were identified for Crysten: desire to do better for herself and "get better." If she were to become very depressed in the future, which is a sign that a crisis may occur, she identified the following coping skills she could engage in: watch TV, go for walk, listen to music, pray, and read Bible. It was recommended the aforementioned be written down and developed into a coping card for future reference. Psychoeducation regarding the importance of reaching out to a trusted individual and/or utilizing emergency resources if there is a change in emotional status and/or there is an inability to ensure safety was provided. Additionally, Karlin denied current access to firearms and/or weapons.   Shaelyn described her typical mood lately as "irritable." She reported ongoing concentration and memory issues, noting "I forget everything." She added that Adderall "helps keep [her] woke." She also discussed experiencing ongoing worry regarding her lack of transportation and that will "get put out" from her home.  Jaselynn denied current alcohol use. She denied tobacco  use. She denied illicit/recreational substance use. Furthermore, Liliani indicated she is not experiencing the following: hallucinations and delusions, paranoia, symptoms of mania , social withdrawal, crying spells, panic attacks, symptoms of trauma, and obsessions and compulsions. She also denied current suicidal ideation, plan, and intent; history of and current homicidal ideation, plan, and intent; and history of and current engagement in self-harm.  Legal History: Janika reported there is no history of legal involvement.   Structured Assessments Results: The Patient Health Questionnaire-9 (PHQ-9) is a self-report measure that assesses symptoms and severity of depression over the course of the last two weeks. Sydne obtained a score of 9 suggesting mild depression. Viana finds the endorsed symptoms to be extremely difficult. [0= Not at all; 1= Several days; 2= More than half the days; 3= Nearly every day] Little interest or pleasure in doing things 1  Feeling down, depressed, or hopeless 1  Trouble falling or staying asleep, or sleeping too much 3  Feeling tired or having little energy 2  Poor appetite or overeating- fluctuates 1  Feeling bad about yourself --- or that you are a failure or have let yourself or your family down 0  Trouble concentrating on things, such as reading the newspaper or watching television 1  Moving or speaking so slowly that other people could have noticed?  Or the opposite --- being so fidgety or restless that you have been moving around a lot more than usual 0  Thoughts that you would be better off dead or hurting yourself in some way 0  PHQ-9 Score 9    The Generalized Anxiety Disorder-7 (GAD-7) is a brief self-report measure that assesses symptoms of anxiety over the course of the last two weeks. Shia obtained a score of 14 suggesting moderate anxiety. Leya finds the endorsed symptoms to be extremely difficult. [0= Not at all; 1= Several  days; 2= Over half the days; 3= Nearly every day] Feeling nervous, anxious, on edge 1  Not being able to stop or control worrying 1  Worrying too much about different things 2  Trouble relaxing 1  Being so restless that it's hard to sit still 3  Becoming easily annoyed or irritable 3  Feeling afraid as if something awful might happen 1  GAD-7 Score 14   Interventions:  Conducted a chart review Focused on rapport building Verbally administered PHQ-9 and GAD-7 for symptom monitoring Verbally administered Food & Mood questionnaire to assess various behaviors related to emotional eating Provided emphatic reflections and validation Psychoeducation provided regarding physical versus emotional hunger Conducted a risk assessment Recommended/discussed option for longer-term therapeutic services  Diagnostic Impressions & Provisional DSM-5 Diagnosis(es): Hanah reported a history of engagement in emotional eating behaviors, noting she is unsure regarding the onset. She noted the current frequency as "daily." She denied engagement in any other disordered eating behaviors. As such, the following diagnosis was assigned: F50.89 Other Specified Feeding or Eating Disorder, Emotional Eating Behaviors. Additionally, she reported a history of depression and anxiety, currently experiencing depression and anxiety related symptomatology per self-report and administered measures, and current prescription of psychotropic medications. As such, the following diagnoses were assigned: F41.1 Generalized Anxiety Disorder and F33.0 Major Depressive Disorder, Recurrent Episode, Mild. Moreover, chart review revealed a diagnosis of ADHD and prescription of Adderall. Abie also endorsed concentration related concerns and discussed her prescription of Adderall. Given the limited scope of this appointment and this provider's role with the clinic, the following diagnosis was assigned: F90.9 Unspecified  Attention-Deficit/Hyperactivity Disorder.  Plan: Caitlain appears able and willing to participate as evidenced by engagement in reciprocal conversation and asking questions as needed for clarification. The next appointment is scheduled for 09/09/2023 at 2pm, which will be via MyChart Video Visit. The following treatment goal was established: increase coping skills. This provider will regularly review the treatment plan and medical chart to keep informed of status changes. Malyn expressed understanding and agreement with the initial treatment plan of care. Nesha will be sent a handout via e-mail to utilize between now and the next appointment to increase awareness of hunger patterns and subsequent eating. Aleeya provided verbal consent during today's appointment for this provider to send the handout via e-mail.   This provider re-iterated her role with the clinic and recommended traditional therapeutic services. As such,  Jayonna provided verbal consent for this provider to place a referral with [facility]. She also provided verbal consent for this provider to e-mail additional referral options. Due to current symptomatology and memory-related concerns, it was recommended she contact NP Doyne Keel; she agreed.

## 2023-08-20 ENCOUNTER — Encounter: Payer: Self-pay | Admitting: *Deleted

## 2023-08-21 ENCOUNTER — Other Ambulatory Visit: Payer: Self-pay

## 2023-08-21 ENCOUNTER — Encounter: Payer: Self-pay | Admitting: Internal Medicine

## 2023-08-27 ENCOUNTER — Other Ambulatory Visit: Payer: Self-pay

## 2023-08-28 ENCOUNTER — Other Ambulatory Visit: Payer: Self-pay

## 2023-09-02 ENCOUNTER — Encounter (INDEPENDENT_AMBULATORY_CARE_PROVIDER_SITE_OTHER): Payer: Self-pay | Admitting: Family Medicine

## 2023-09-02 ENCOUNTER — Ambulatory Visit (INDEPENDENT_AMBULATORY_CARE_PROVIDER_SITE_OTHER): Payer: Commercial Managed Care - HMO | Admitting: Family Medicine

## 2023-09-02 ENCOUNTER — Other Ambulatory Visit (HOSPITAL_COMMUNITY): Payer: Self-pay

## 2023-09-02 ENCOUNTER — Other Ambulatory Visit: Payer: Self-pay

## 2023-09-02 VITALS — BP 107/73 | HR 91 | Temp 98.6°F | Ht 63.0 in | Wt >= 6400 oz

## 2023-09-02 DIAGNOSIS — R7303 Prediabetes: Secondary | ICD-10-CM | POA: Diagnosis not present

## 2023-09-02 DIAGNOSIS — E559 Vitamin D deficiency, unspecified: Secondary | ICD-10-CM | POA: Diagnosis not present

## 2023-09-02 DIAGNOSIS — Z6841 Body Mass Index (BMI) 40.0 and over, adult: Secondary | ICD-10-CM

## 2023-09-02 MED ORDER — VITAMIN D (ERGOCALCIFEROL) 1.25 MG (50000 UNIT) PO CAPS
50000.0000 [IU] | ORAL_CAPSULE | ORAL | 0 refills | Status: DC
Start: 2023-09-04 — End: 2023-09-24
  Filled 2023-09-02: qty 10, 35d supply, fill #0
  Filled 2023-09-02: qty 8, 28d supply, fill #0
  Filled ????-??-??: fill #0

## 2023-09-02 NOTE — Progress Notes (Signed)
[name], D.O.  ABFM, ABOM Specializing in Clinical Bariatric Medicine  Office located at: [address] [place], [place]  [address]     Assessment and Plan:   Medications Discontinued During This Encounter  Medication Reason   amoxicillin-clavulanate (AUGMENTIN) 875-125 MG tablet Completed Course   Vitamin D, Ergocalciferol, (DRISDOL) 1.25 MG (50000 UNIT) CAPS capsule Reorder     Meds ordered this encounter  Medications   Vitamin D, Ergocalciferol, (DRISDOL) 1.25 MG (50000 UNIT) CAPS capsule    Sig: Take 1 capsule (50,000 Units total) by mouth 2 (two) times a week.    Dispense:  10 capsule    Refill:  0     Prediabetes Assessment & Plan: Lab Results  Component Value Date   HGBA1C 5.9 (H) 07/17/2023   HGBA1C 5.6 08/01/2022   HGBA1C 5.6 01/17/2021   INSULIN 19.1 07/17/2023    No current meds. Diet/exercise approach. Reports sometimes having hunger and cravings. Continue to decrease simple carbs/ sugars; increase fiber and proteins -> follow her meal plan. Will recheck levels as deemed appropriate.    Vitamin D deficiency Assessment & Plan: Lab Results  Component Value Date   VD25OH 11.1 (L) 07/17/2023   VD25OH 8.7 (L) 01/29/2023   VD25OH 14.9 (L) 11/19/2017   Condition treated with ERGO 50,000 units twice weekly. Continue with their weight loss efforts and ERGO at current dose - Will refill today.      BMI 70 and over, adult [facility]) Morbid obesity with starting BMI 76.7 Assessment & Plan: Since last office visit on 07/31/23 patient's muscle mass has decreased by 4.8 lb. Fat mass has increased by 5 lb. Counseling done on how various foods will affect these numbers and how to maximize success  Total lbs lost to date: 17 lbs  Total weight loss percentage to date: 3.93%  No change to meal plan - see Subjective  Per pt, she has yet to obtain [facility] - she was told by the pharmacist that they are waiting on a prior authorization. Pt instructed to call  pharmacy and make sure that they send a message to our office stating a prior auth needs to be done on the medicine.   Additionally, I recommended pt to explore the [employer], which is a good tool for locating nearby food pantries.    Behavioral Intervention Additional resources provided today: NA Evidence-based interventions for health behavior change were utilized today including the discussion of self monitoring techniques, problem-solving barriers and SMART goal setting techniques.   Regarding patient's less desirable eating habits and patterns, we employed the technique of small changes.  Pt will specifically work on: increasing lean protein intake & eating on plan more for next visit.    FOLLOW UP: Return 09/24/23. She was informed of the importance of frequent follow up visits to maximize her success with intensive lifestyle modifications for her multiple health conditions.  Subjective:   Chief complaint: Obesity [name] is here to discuss her progress with her obesity treatment plan. She is on the Category 3 Plan and states she is following her eating plan approximately 60% of the time. She states she is not exercising.   Interval History:  [name] is here for a follow up office visit. This is my first time meeting with [name]; she was previously seen by Dr.Bowen. Since last OV, [name] has been doing okay. Reports that she ran out of vegetables/meats and has been having issues with her food stamps. Has been  eating more noodles recently. She is drinking diet soda and sugar free kool-aid instead of regular sodas. Endorses typically having 4 ounces of lean protein at lunch. She is not sure how much protein she has for dinner.   Pharmacotherapy for weight loss: She is currently taking  Wellbutrin XL 300 mg daily & Topamax 50 mg bid    for medical weight loss.  Denies side effects.    Review of Systems:  Pertinent positives were addressed with patient  today.  Reviewed by clinician on day of visit: allergies, medications, problem list, medical history, surgical history, family history, social history, and previous encounter notes.  Weight Summary and Biometrics   Weight Lost Since Last Visit: 0  Weight Gained Since Last Visit: 0   Vitals Temp: 98.6 F (37 C) BP: 107/73 Pulse Rate: 91 SpO2: 100 %   Anthropometric Measurements Height: 5\' 3"  (1.6 m) Weight: (!) 416 lb (188.7 kg) BMI (Calculated): 73.71 Weight at Last Visit: 416 lb Weight Lost Since Last Visit: 0 Weight Gained Since Last Visit: 0 Starting Weight: 0 Total Weight Loss (lbs): 17 lb (7.711 kg)   Body Composition  Body Fat %: 66.7 % Fat Mass (lbs): 277.4 lbs Muscle Mass (lbs): 131.6 lbs Visceral Fat Rating : 34   Other Clinical Data Fasting: no Labs: no Today's Visit #: 3 Starting Date: 07/17/23   Objective:   PHYSICAL EXAM: Blood pressure 107/73, pulse 91, temperature 98.6 F (37 C), height 5\' 3"  (1.6 m), weight (!) 416 lb (188.7 kg), last menstrual period 08/29/2023, SpO2 100%. Body mass index is 73.69 kg/m.  General: Well Developed, well nourished, and in no acute distress.  HEENT: Normocephalic, atraumatic Skin: Warm and dry, cap RF less 2 sec, good turgor Chest:  Normal excursion, shape, no gross abn Respiratory: speaking in full sentences, no conversational dyspnea NeuroM-Sk: Ambulates w/o assistance, moves * 4 Psych: A and O *3, insight good, mood-full  DIAGNOSTIC DATA REVIEWED:  BMET    Component Value Date/Time   NA 140 07/17/2023 1054   K 4.8 07/17/2023 1054   CL 106 07/17/2023 1054   CO2 21 07/17/2023 1054   GLUCOSE 95 07/17/2023 1054   GLUCOSE 112 (H) 04/29/2023 2253   BUN 7 07/17/2023 1054   CREATININE 0.56 (L) 07/17/2023 1054   CREATININE 0.47 02/11/2023 1122   CREATININE 0.48 (L) 03/28/2015 1450   CALCIUM 9.1 07/17/2023 1054   GFRNONAA >60 04/29/2023 2253   GFRNONAA >60 02/11/2023 1122   GFRNONAA >89 03/28/2015 1450    GFRAA 146 01/17/2021 1420   GFRAA >89 03/28/2015 1450   Lab Results  Component Value Date   HGBA1C 5.9 (H) 07/17/2023   HGBA1C 5.30 03/28/2015   Lab Results  Component Value Date   INSULIN 19.1 07/17/2023   Lab Results  Component Value Date   TSH 2.140 07/17/2023   CBC    Component Value Date/Time   WBC 7.0 07/17/2023 1054   WBC 10.1 04/29/2023 2253   RBC 4.78 07/17/2023 1054   RBC 5.03 04/29/2023 2253   HGB 7.5 (L) 07/17/2023 1054   HCT 29.5 (L) 07/17/2023 1054   PLT 483 (H) 07/17/2023 1054   MCV 62 (L) 07/17/2023 1054   MCH 15.7 (L) 07/17/2023 1054   MCH 15.9 (L) 04/29/2023 2253   MCHC 25.4 (L) 07/17/2023 1054   MCHC 27.0 (L) 04/29/2023 2253   RDW 20.4 (H) 07/17/2023 1054   Iron Studies    Component Value Date/Time   IRON 11 (L) 02/11/2023 1122  IRON 11 (L) 01/29/2023 1639   TIBC 414 02/11/2023 1122   TIBC 331 01/29/2023 1639   FERRITIN 6 (L) 02/11/2023 1122   FERRITIN 11 (L) 01/29/2023 1639   IRONPCTSAT 3 (L) 02/11/2023 1122   IRONPCTSAT 3 (LL) 01/29/2023 1639   IRONPCTSAT 1 (L) 02/13/2017 1517   Lipid Panel     Component Value Date/Time   CHOL 164 07/17/2023 1054   TRIG 86 07/17/2023 1054   HDL 43 07/17/2023 1054   CHOLHDL 3.8 07/17/2023 1054   LDLCALC 105 (H) 07/17/2023 1054   Hepatic Function Panel     Component Value Date/Time   PROT 7.5 07/17/2023 1054   ALBUMIN 3.8 (L) 07/17/2023 1054   AST 9 07/17/2023 1054   AST 9 (L) 02/11/2023 1122   ALT 12 07/17/2023 1054   ALT 10 02/11/2023 1122   ALKPHOS 118 07/17/2023 1054   BILITOT 0.2 07/17/2023 1054   BILITOT 0.4 02/11/2023 1122   BILIDIR <0.1 04/30/2023 0916   IBILI NOT CALCULATED 04/30/2023 0916      Component Value Date/Time   TSH 2.140 07/17/2023 1054   Nutritional Lab Results  Component Value Date   VD25OH 11.1 (L) 07/17/2023   VD25OH 8.7 (L) 01/29/2023   VD25OH 14.9 (L) 11/19/2017    Attestations:   I, Special Puri, acting as a Stage manager for Thomasene Lot, DO.,  have compiled all relevant documentation for today's office visit on behalf of Thomasene Lot, DO, while in the presence of [employer], DO.  I have reviewed the above documentation for accuracy and completeness, and I agree with the above. [name], D.O.  The 21st Century Cures Act was signed into law in 2016 which includes the topic of electronic health records.  This provides immediate access to information in MyChart.  This includes consultation notes, operative notes, office notes, lab results and pathology reports.  If you have any questions about what you read please let us know at your next visit so we can discuss your concerns and take corrective action if need be.  We are right here with you.

## 2023-09-03 ENCOUNTER — Telehealth: Payer: Self-pay | Admitting: *Deleted

## 2023-09-03 ENCOUNTER — Other Ambulatory Visit: Payer: Self-pay

## 2023-09-03 ENCOUNTER — Other Ambulatory Visit (HOSPITAL_COMMUNITY): Payer: Self-pay

## 2023-09-03 NOTE — Telephone Encounter (Signed)
Prior authorization done via cover my meds for patients. [employer]govy. Waiting on determination.

## 2023-09-04 ENCOUNTER — Other Ambulatory Visit: Payer: Self-pay

## 2023-09-05 ENCOUNTER — Other Ambulatory Visit: Payer: Self-pay

## 2023-09-08 ENCOUNTER — Telehealth: Payer: Self-pay

## 2023-09-08 NOTE — Telephone Encounter (Signed)
Copied from CRM [id number]. Topic: General - Other >> Sep 08, 2023  2:55 PM Phill Myron wrote: Ms. Bommer is asking if  Dr Alvis Lemmings can please write a letter, on her behalf stating that she can not work and when it is completed please mail it to her

## 2023-09-08 NOTE — Telephone Encounter (Signed)
Routing to PCP for review.

## 2023-09-09 ENCOUNTER — Telehealth (INDEPENDENT_AMBULATORY_CARE_PROVIDER_SITE_OTHER): Payer: Commercial Managed Care - HMO | Admitting: Psychology

## 2023-09-09 DIAGNOSIS — F411 Generalized anxiety disorder: Secondary | ICD-10-CM

## 2023-09-09 DIAGNOSIS — F5089 Other specified eating disorder: Secondary | ICD-10-CM

## 2023-09-09 DIAGNOSIS — F330 Major depressive disorder, recurrent, mild: Secondary | ICD-10-CM | POA: Diagnosis not present

## 2023-09-09 DIAGNOSIS — F909 Attention-deficit hyperactivity disorder, unspecified type: Secondary | ICD-10-CM | POA: Diagnosis not present

## 2023-09-09 NOTE — Telephone Encounter (Signed)
Pt has been called and informed of letter being placed in the mail.

## 2023-09-09 NOTE — Telephone Encounter (Signed)
Done

## 2023-09-09 NOTE — Progress Notes (Signed)
Office: [phone]  /  Fax: [phone]    Date: September 09, 2023  Appointment Start Time: 2:00pm Duration: 21 minutes Provider: Lawerance Cruel, Psy.D. Type of Session: Individual Therapy  Location of Patient: Home (private location) Location of Provider: Provider's Home (private office) Type of Contact: Telepsychological Visit via MyChart Video Visit  Session Content: [name] is a 31 y.o. female presenting for a follow-up appointment to address the previously established treatment goal of increasing coping skills.Today's appointment was a telepsychological visit. [name] provided verbal consent for today's telepsychological appointment and she is aware she is responsible for securing confidentiality on her end of the session. Prior to proceeding with today's appointment, [name]'s physical location at the time of this appointment was obtained as well a phone number she could be reached at in the event of technical difficulties. [name] and this provider participated in today's telepsychological service.   This provider conducted a brief check-in. [name] shared she is "okay," noting she maintained her weight. She indicated she forgot to call NP Doyne Keel, but she noted a plan to call after this appointment. Phone number was provided for [facility] and she was encouraged to reach out to establish care. A risk assessment was completed. [name] denied experiencing suicidal, self-harm and homicidal ideation, plan, and intent since the last appointment with this provider. She described future plans that include ongoing efforts to improve her well-being and eating habits. Regarding her eating habits, she stated she increased her protein intake. Psychoeducation regarding triggers for emotional eating was provided. [name] was provided a handout, and encouraged to utilize the handout between now and the next appointment to increase awareness of triggers and  frequency. [name] agreed. This provider also discussed behavioral strategies for specific triggers, such as placing the utensil down when conversing to avoid mindless eating. [name] provided verbal consent during today's appointment for this provider to send a handout about triggers via e-mail. Of note, [name] appeared sleepy as evidenced by her eyes closing and her head nodding at times. 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 [name] was receptive to today's appointment as evidenced by openness to sharing, responsiveness to feedback, and willingness to explore triggers for emotional eating.  Mental Status Examination:  Appearance: neat Behavior: appropriate to circumstances Mood: neutral; tired Affect: mood congruent Speech: WNL Eye Contact: appropriate Psychomotor Activity: WNL Gait: unable to assess Thought Process: linear, logical, and goal directed and denies suicidal, homicidal, and self-harm ideation, plan and intent  Thought Content/Perception: no hallucinations, delusions, bizarre thinking or behavior endorsed or observed Orientation: AAOx4 Memory/Concentration: intact Insight: fair Judgment: fair  Interventions:  Conducted a brief chart review Conducted a risk assessment Provided empathic reflections and validation Employed supportive psychotherapy interventions to facilitate reduced distress and to improve coping skills with identified stressors Recommended/discussed options for longer-term therapeutic services Psychoeducation provided regarding triggers for emotional eating behaviors  DSM-5 Diagnosis(es):  F50.89 Other Specified Feeding or Eating Disorder, Emotional Eating Behaviors, F41.1 Generalized Anxiety Disorder, F33.0 Major Depressive Disorder, Recurrent Episode, Mild, and F90.9 Unspecified Attention-Deficit/Hyperactivity Disorder   Treatment Goal & Progress: During the initial appointment with this provider, the following  treatment goal was established: increase coping skills. Progress is limited, as [name] has just begun treatment with this provider; however, she is receptive to the interaction and interventions and rapport is being established.   Plan: The next appointment is scheduled for 10/06/2023 at 2pm, which will be via MyChart Video Visit. The next session will focus on working towards  the established treatment goal. [name] will call NP Kathlene November and [facility].

## 2023-09-10 ENCOUNTER — Other Ambulatory Visit: Payer: Self-pay

## 2023-09-10 ENCOUNTER — Encounter: Payer: Self-pay | Admitting: Internal Medicine

## 2023-09-17 ENCOUNTER — Encounter: Payer: Self-pay | Admitting: Internal Medicine

## 2023-09-22 ENCOUNTER — Encounter: Payer: Self-pay | Admitting: Internal Medicine

## 2023-09-23 ENCOUNTER — Telehealth (INDEPENDENT_AMBULATORY_CARE_PROVIDER_SITE_OTHER): Payer: 59 | Admitting: Psychiatry

## 2023-09-23 ENCOUNTER — Other Ambulatory Visit: Payer: Self-pay

## 2023-09-23 ENCOUNTER — Encounter (HOSPITAL_COMMUNITY): Payer: Self-pay | Admitting: Psychiatry

## 2023-09-23 DIAGNOSIS — F411 Generalized anxiety disorder: Secondary | ICD-10-CM

## 2023-09-23 DIAGNOSIS — F900 Attention-deficit hyperactivity disorder, predominantly inattentive type: Secondary | ICD-10-CM

## 2023-09-23 DIAGNOSIS — R413 Other amnesia: Secondary | ICD-10-CM | POA: Diagnosis not present

## 2023-09-23 DIAGNOSIS — F330 Major depressive disorder, recurrent, mild: Secondary | ICD-10-CM

## 2023-09-23 MED ORDER — AMPHETAMINE-DEXTROAMPHETAMINE 20 MG PO TABS
20.0000 mg | ORAL_TABLET | Freq: Every day | ORAL | 0 refills | Status: DC
  Filled 2023-09-23 – 2023-10-07 (×3): qty 30, 30d supply, fill #0

## 2023-09-23 MED ORDER — HYDROXYZINE HCL 10 MG PO TABS
10.0000 mg | ORAL_TABLET | Freq: Three times a day (TID) | ORAL | 3 refills | Status: DC | PRN
Start: 2023-09-23 — End: 2023-12-16
  Filled 2023-09-23: qty 90, 30d supply, fill #0

## 2023-09-23 MED ORDER — BUPROPION HCL ER (XL) 300 MG PO TB24
300.0000 mg | ORAL_TABLET | ORAL | 3 refills | Status: DC
Start: 2023-09-23 — End: 2023-12-16
  Filled 2023-09-23 – 2023-10-07 (×3): qty 30, 30d supply, fill #0
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
## 2023-09-24 ENCOUNTER — Ambulatory Visit (INDEPENDENT_AMBULATORY_CARE_PROVIDER_SITE_OTHER): Payer: Managed Care, Other (non HMO) | Admitting: Adult Health

## 2023-09-24 ENCOUNTER — Encounter (INDEPENDENT_AMBULATORY_CARE_PROVIDER_SITE_OTHER): Payer: Self-pay | Admitting: Adult Health

## 2023-09-24 ENCOUNTER — Other Ambulatory Visit: Payer: Self-pay

## 2023-09-24 VITALS — BP 96/55 | HR 94 | Temp 98.2°F | Ht 63.0 in | Wt >= 6400 oz

## 2023-09-24 DIAGNOSIS — E669 Obesity, unspecified: Secondary | ICD-10-CM | POA: Diagnosis not present

## 2023-09-24 DIAGNOSIS — E559 Vitamin D deficiency, unspecified: Secondary | ICD-10-CM | POA: Diagnosis not present

## 2023-09-24 DIAGNOSIS — R7303 Prediabetes: Secondary | ICD-10-CM | POA: Diagnosis not present

## 2023-09-24 DIAGNOSIS — R632 Polyphagia: Secondary | ICD-10-CM

## 2023-09-24 DIAGNOSIS — Z6841 Body Mass Index (BMI) 40.0 and over, adult: Secondary | ICD-10-CM

## 2023-09-24 MED ORDER — WEGOVY 0.25 MG/0.5ML ~~LOC~~ SOAJ
0.2500 mg | SUBCUTANEOUS | 0 refills | Status: DC
  Filled 2023-09-24 – 2023-10-08 (×3): qty 2, 28d supply, fill #0

## 2023-09-24 MED ORDER — VITAMIN D (ERGOCALCIFEROL) 1.25 MG (50000 UNIT) PO CAPS
50000.0000 [IU] | ORAL_CAPSULE | ORAL | 0 refills | Status: DC
  Filled 2023-09-24: qty 10, 30d supply, fill #0
  Filled 2023-10-07 (×2): qty 8, 28d supply, fill #0

## 2023-09-24 NOTE — Progress Notes (Signed)
WEIGHT SUMMARY AND BIOMETRICS  Vitals Temp: 98.2 F (36.8 C) BP: (!) 96/55 Pulse Rate: 94 SpO2: 98 %   Anthropometric Measurements Height: 5\' 3"  (1.6 m) Weight: (!) 413 lb (187.3 kg) BMI (Calculated): 73.18 Weight at Last Visit: 416lb Weight Lost Since Last Visit: 3lb Weight Gained Since Last Visit: 0 Starting Weight: 433lb Total Weight Loss (lbs): 20 lb (9.072 kg)   Body Composition  Body Fat %: 66.7 % Fat Mass (lbs): 276 lbs Muscle Mass (lbs): 130.8 lbs Visceral Fat Rating : 33   Other Clinical Data Fasting: no Labs: no Today's Visit #: 4 Starting Date: 07/17/23    Chief Complaint:   OBESITY [name] is here to discuss her progress with her obesity treatment plan. She is on the the Category 3 Plan and states she is following her eating plan approximately 40 % of the time. She states she is not currently exercising.   Interim History:  [name] lives with her mother and 43 year old brother. She is not currently working. She will prepare/cook most meals for the family. Her mother does the bulk of grocery shopping.  [name] provided the following food recall that is typical of a day- Breakfast- 3 eggs with vegetables Lunch- Meat sandwich or servings of chicken Dinner- Chicken or Shrimp  Sleep- she reports going to bed at 0200, rising at 1100/1200 Exercise-none Hydration-she reports drinking a few sips of water a day, prefers to hydrate with sugar free KoolAid, Diet Soda, and Lemonade  Subjective:   1. Vitamin D deficiency  Latest Reference Range & Units 07/17/23 10:54  Vitamin D, 25-Hydroxy 30.0 - 100.0 ng/mL 11.1 (L)  (L): Data is abnormally low  She was recently started on bi-weekly Ergocalciferol- denies N/V/Muscle Weakness  2. Prediabetes Lab Results  Component Value Date   HGBA1C 5.9 (H) 07/17/2023   HGBA1C 5.6 08/01/2022   HGBA1C 5.6 01/17/2021   She has not started GLP-1 therapy It appears that some of her insurance  information was lacking- will obtain today  5. Polyphagia 07/31/2023- initial Wegovy 0.25mg  Rx sent in. Pt is unsure why Rx has not been filled? She denies family hx of MEN 2 or MTC She denies personal hx of pancreatitis  Assessment/Plan:   1. Vitamin D deficiency Refill Vitamin D, Ergocalciferol, (DRISDOL) 1.25 MG (50000 UNIT) CAPS capsule (Future) Take 1 capsule (50,000 Units total) by mouth 2 (two) times a week. Dispense: 10 capsule, Refills: 0 ordered    2. Prediabetes Increase protein and limit sugar/simple CHO INCREASE DAILY MOVEMENT/ACTIVITY- Walk around house every hour Submit current insurance information to clerical staff  3. Polyphagia Refill - Semaglutide-Weight Management (WEGOVY) 0.25 MG/0.5ML SOAJ; Inject 0.25 mg into the skin once a week.  Dispense: 2 mL; Refill: 0  4. BMI 70 and over, adult [facility]), Current BMI 73.18  [name] is currently in the action stage of change. As such, her goal is to continue with weight loss efforts. She has agreed to the Category 3 Plan.   Exercise goals: All adults should avoid inactivity. Some physical activity is better than none, and adults who participate in any amount of physical activity gain some health benefits.  Behavioral modification strategies: increasing lean protein intake, decreasing simple carbohydrates, increasing vegetables, increasing water intake, decreasing liquid calories, decreasing sodium intake, increasing high fiber foods, no skipping meals, meal planning and cooking strategies, keeping healthy foods in the home, better snacking choices, emotional eating strategies, and planning for success.  [name] has agreed to  follow-up with our clinic in 2 weeks. She was informed of the importance of frequent follow-up visits to maximize her success with intensive lifestyle modifications for her multiple health conditions.   Objective:   Blood pressure (!) 96/55, pulse 94, temperature 98.2 F (36.8 C), height 5\' 3"   (1.6 m), weight (!) 413 lb (187.3 kg), last menstrual period 08/29/2023, SpO2 98%. Body mass index is 73.16 kg/m.  General: Cooperative, alert, well developed, in no acute distress. HEENT: Conjunctivae and lids unremarkable. Cardiovascular: Regular rhythm.  Lungs: Normal work of breathing. Neurologic: No focal deficits.   Lab Results  Component Value Date   CREATININE 0.56 (L) 07/17/2023   BUN 7 07/17/2023   NA 140 07/17/2023   K 4.8 07/17/2023   CL 106 07/17/2023   CO2 21 07/17/2023   Lab Results  Component Value Date   ALT 12 07/17/2023   AST 9 07/17/2023   ALKPHOS 118 07/17/2023   BILITOT 0.2 07/17/2023   Lab Results  Component Value Date   HGBA1C 5.9 (H) 07/17/2023   HGBA1C 5.6 08/01/2022   HGBA1C 5.6 01/17/2021   HGBA1C 5.6 12/29/2017   HGBA1C 5.7 01/10/2017   Lab Results  Component Value Date   INSULIN 19.1 07/17/2023   Lab Results  Component Value Date   TSH 2.140 07/17/2023   Lab Results  Component Value Date   CHOL 164 07/17/2023   HDL 43 07/17/2023   LDLCALC 105 (H) 07/17/2023   TRIG 86 07/17/2023   CHOLHDL 3.8 07/17/2023   Lab Results  Component Value Date   VD25OH 11.1 (L) 07/17/2023   VD25OH 8.7 (L) 01/29/2023   VD25OH 14.9 (L) 11/19/2017   Lab Results  Component Value Date   WBC 7.0 07/17/2023   HGB 7.5 (L) 07/17/2023   HCT 29.5 (L) 07/17/2023   MCV 62 (L) 07/17/2023   PLT 483 (H) 07/17/2023   Lab Results  Component Value Date   IRON 11 (L) 02/11/2023   TIBC 414 02/11/2023   FERRITIN 6 (L) 02/11/2023   Attestation Statements:   Reviewed by clinician on day of visit: allergies, medications, problem list, medical history, surgical history, family history, social history, and previous encounter notes.  I have reviewed the above documentation for accuracy and completeness, and I agree with the above. -  [name] d. [name] Taite, NP-C

## 2023-09-25 ENCOUNTER — Other Ambulatory Visit: Payer: Self-pay

## 2023-09-25 ENCOUNTER — Telehealth (INDEPENDENT_AMBULATORY_CARE_PROVIDER_SITE_OTHER): Payer: Self-pay

## 2023-09-25 NOTE — Telephone Encounter (Signed)
Call to Jonny Ruiz at [facility] in the prior Auth department: [phone].  Reginal Lutes is a plan exclusion.  Case ID# [id number], time spent on the call 47 minutes.    Received denial letter, call back to medicaid, spent another 30 minutes on the phone trying to get in touch with the prior authorization team or the pharmacy help desk.  Finally able to get through.    Medicaid Fax # [phone] Attn:  Prior Auth Department Case # [id number] Primary insurance is not paying because of a plan exclusion. Patients medicaid ID# [id number].  Denial letter from [name] Auerbach has been faxed to [facility] of .  Awaiting response.

## 2023-09-25 NOTE — Telephone Encounter (Signed)
Attempted to do a prior Auth for [facility] through [employer].  This was the insurance response.    Information regarding your request:  Your patient will pay 100% of a discounted price for this medication. Any amount the patient pays will not apply to their deductible or out-of-pocket expenses.  [employer] card information. [phone]  Rx Bin:  [id number] PCN:  [id number] Group# [id number] ID# [id number]   Call to patient to try to get information from her about how much she owes on her deductible. No answer.  Left message for patient to check her my chart message and to respond back.

## 2023-09-25 NOTE — Telephone Encounter (Signed)
PA submitted for [facility], waiting on determination.

## 2023-09-25 NOTE — Telephone Encounter (Signed)
Prior Berkley Harvey has been faxed to [phone].  Confirmation received.

## 2023-09-26 ENCOUNTER — Other Ambulatory Visit: Payer: Self-pay

## 2023-09-29 NOTE — Telephone Encounter (Signed)
PA for [facility] was approved from 09/25/2023 until 03/25/2024. Patient notified.

## 2023-10-01 ENCOUNTER — Ambulatory Visit (INDEPENDENT_AMBULATORY_CARE_PROVIDER_SITE_OTHER): Payer: Commercial Managed Care - HMO | Admitting: Family Medicine

## 2023-10-02 NOTE — Telephone Encounter (Signed)
   Phineas Douglas, CMA   All Conversations: Medication (Newest Message First)September 29, 2023 Joylene John A, [place]  to Delecia A Kruger      09/29/23  2:23 PM Hello, I wanted to let you know the PA for Wegovy was approved from 09/25/2023 until 03/25/2024. Please call your pharmacy to have it filled. Let us know if you have any questions. Thanks Selena Batten, CMA  This MyChart message has not been read. Phineas Douglas, [place]      09/29/23  2:21 PM Note PA for Reginal Lutes was approved from 09/25/2023 until 03/25/2024. Patient notified.

## 2023-10-03 ENCOUNTER — Other Ambulatory Visit: Payer: Self-pay

## 2023-10-04 ENCOUNTER — Encounter: Payer: Self-pay | Admitting: Internal Medicine

## 2023-10-06 ENCOUNTER — Telehealth (INDEPENDENT_AMBULATORY_CARE_PROVIDER_SITE_OTHER): Payer: Self-pay | Admitting: Psychology

## 2023-10-06 ENCOUNTER — Telehealth (INDEPENDENT_AMBULATORY_CARE_PROVIDER_SITE_OTHER): Payer: Commercial Managed Care - HMO | Admitting: Psychology

## 2023-10-06 NOTE — Progress Notes (Unsigned)
  Office: [phone]  /  Fax: [phone]    Date: October 06, 2023  Appointment Start Time: *** Duration: *** minutes Provider: Lawerance Cruel, Psy.D. Type of Session: Individual Therapy  Location of Patient: {gbptloc:23249} (private location) Location of Provider: Provider's Home (private office) Type of Contact: Telepsychological Visit via MyChart Video Visit  Session Content: This provider called [name] at 2:03pm as she did not present for today's appointment. A HIPAA compliant voicemail was left requesting a call back. *** As such, today's appointment was initiated *** minutes late.[name] is a 32 y.o. female presenting for a follow-up appointment to address the previously established treatment goal of increasing coping skills.Today's appointment was a telepsychological visit. [name] provided verbal consent for today's telepsychological appointment and she is aware she is responsible for securing confidentiality on her end of the session. Prior to proceeding with today's appointment, [name]'s physical location at the time of this appointment was obtained as well a phone number she could be reached at in the event of technical difficulties. Jaslyn and this provider participated in today's telepsychological service.   This provider conducted a brief check-in. *** [name] was receptive to today's appointment as evidenced by openness to sharing, responsiveness to feedback, and {gbreceptiveness:23401}.  Mental Status Examination:  Appearance: {Appearance:22431} Behavior: {Behavior:22445} Mood: {gbmood:21757} Affect: {Affect:22436} Speech: {Speech:22432} Eye Contact: {Eye Contact:22433} Psychomotor Activity: {Motor Activity:22434} Gait: {gbgait:23404} Thought Process: {thought process:22448}  Thought Content/Perception: {disturbances:22451} Orientation: {Orientation:22437} Memory/Concentration: {gbcognition:22449} Insight: {Insight:22446} Judgment:  {Insight:22446}  Interventions:  {Interventions for Progress Notes:23405}  DSM-5 Diagnosis(es):  F50.89 Other Specified Feeding or Eating Disorder, Emotional Eating Behaviors, F41.1 Generalized Anxiety Disorder, F33.0 Major Depressive Disorder, Recurrent Episode, Mild, and F90.9 Unspecified Attention-Deficit/Hyperactivity Disorder   Treatment Goal & Progress: During the initial appointment with this provider, the following treatment goal was established: increase coping skills. [name] has demonstrated progress in her goal as evidenced by {gbtxprogress:22839}. [name] also {gbtxprogress2:22951}.  Plan: The next appointment is scheduled for *** at ***, which will be via MyChart Video Visit. The next session will focus on {Plan for Next Appointment:23400}.

## 2023-10-06 NOTE — Telephone Encounter (Signed)
  Office: [phone]  /  Fax: [phone]  Date of Call: October 06, 2023  Provider: Lawerance Cruel, PsyD  CONTENT: This provider called Takeyla to check-in as she did not present for today's MyChart Video Visit appointment. A HIPAA compliant voicemail was left requesting a call back. Of note, this provider stayed on the MyChart Video Visit appointment for 5 minutes prior to signing off per the clinic's grace period policy.    PLAN: This provider will wait for Srinika to call back. No further follow-up planned by this provider.

## 2023-10-07 ENCOUNTER — Other Ambulatory Visit: Payer: Self-pay

## 2023-10-07 ENCOUNTER — Other Ambulatory Visit (HOSPITAL_COMMUNITY): Payer: Self-pay

## 2023-10-08 ENCOUNTER — Encounter: Payer: Self-pay | Admitting: Internal Medicine

## 2023-10-08 ENCOUNTER — Ambulatory Visit (INDEPENDENT_AMBULATORY_CARE_PROVIDER_SITE_OTHER): Payer: Commercial Managed Care - HMO | Admitting: Family Medicine

## 2023-10-08 ENCOUNTER — Other Ambulatory Visit: Payer: Self-pay

## 2023-10-08 ENCOUNTER — Other Ambulatory Visit (HOSPITAL_BASED_OUTPATIENT_CLINIC_OR_DEPARTMENT_OTHER): Payer: Self-pay

## 2023-10-08 ENCOUNTER — Other Ambulatory Visit (HOSPITAL_COMMUNITY): Payer: Self-pay

## 2023-10-08 ENCOUNTER — Encounter (INDEPENDENT_AMBULATORY_CARE_PROVIDER_SITE_OTHER): Payer: Self-pay | Admitting: Family Medicine

## 2023-10-08 VITALS — BP 128/84 | HR 101 | Temp 98.6°F | Ht 63.0 in | Wt >= 6400 oz

## 2023-10-08 DIAGNOSIS — R7303 Prediabetes: Secondary | ICD-10-CM

## 2023-10-08 DIAGNOSIS — Z6841 Body Mass Index (BMI) 40.0 and over, adult: Secondary | ICD-10-CM | POA: Diagnosis not present

## 2023-10-08 DIAGNOSIS — E559 Vitamin D deficiency, unspecified: Secondary | ICD-10-CM

## 2023-10-08 NOTE — Progress Notes (Signed)
Carlye Grippe, D.O.  ABFM, ABOM Specializing in Clinical Bariatric Medicine  Office located at: [address] [place], [place]  [address]     Assessment and Plan:   Medications Discontinued During This Encounter  Medication Reason   ferrous sulfate 325 (65 FE) MG tablet     Vitamin D deficiency Assessment: Condition is Not at goal.. She has not been compliant with taking her vitamin D consistently. Pt vitamin D level is very sub-optimal Lab Results  Component Value Date   VD25OH 11.1 (L) 07/17/2023   VD25OH 8.7 (L) 01/29/2023   VD25OH 14.9 (L) 11/19/2017   Plan: - Continue and be more compliant with taking Ergocalciferol 50K IU weekly. I will refill today.   - weight loss will likely improve availability of vitamin D, thus encouraged Christinia to continue with meal plan and their weight loss efforts to further improve this condition.  Thus, we will need to monitor levels regularly (every 3-4 mo on average) to keep levels within normal limits and prevent over supplementation.   Prediabetes Assessment: Condition is Not at goal.. Pt has not started [facility] yet and just had it shipped to her house today. She endorses still having hunger and cravings.  Lab Results  Component Value Date   HGBA1C 5.9 (H) 07/17/2023   HGBA1C 5.6 08/01/2022   HGBA1C 5.6 01/17/2021   INSULIN 19.1 07/17/2023    Plan: - Begin Wegovy 2.5mg  once weekly.    - In addition, we discussed the risks and benefits of various medication options which can help [place] in the management of this disease process as well as with weight loss.  Will consider starting one of these meds in future as we will focus on prudent nutritional plan at this time. I discussed the potential side effects and what to expect when starting these.   - Continue to decrease simple carbs/ sugars; increase fiber and proteins -> follow her meal plan.    - Anticipatory guidance given.    - Callaway will continue to work on weight loss,  exercise, via their meal plan we devised to help decrease the risk of progressing to diabetes.    TREATMENT PLAN FOR OBESITY: BMI 70 and over, adult (HCC), Current BMI 72.82 Morbid obesity with starting BMI 76.7 Assessment:  Skarlet A Heckert is here to discuss her progress with her obesity treatment plan along with follow-up of her obesity related diagnoses. See Medical Weight Management Flowsheet for complete bioelectrical impedance results.  Condition is docourse: improving. Biometric data collected today, was reviewed with patient.   Since last office visit on 09/24/2023 patient's  Muscle mass has increased by 1.4lb. Fat mass has decreased by 3.4lb. Counseling done on how various foods will affect these numbers and how to maximize success  Total lbs lost to date: 22 Total weight loss percentage to date: 5.08%   Plan: - Inaaya will work on healthier eating habits and try to follow the Category 3 meal plan best they can.   Behavioral Intervention Additional resources provided today: patient declined Evidence-based interventions for health behavior change were utilized today including the discussion of self monitoring techniques, problem-solving barriers and SMART goal setting techniques.   Regarding patient's less desirable eating habits and patterns, we employed the technique of small changes.  Pt will specifically work on: Every hour walk around for 5 minutes daily and drink calorie free liquids for next visit.     She has agreed to Think about enjoyable ways to increase daily physical  activity and overcoming barriers to exercise and Increase physical activity in their day and reduce sedentary time (increase NEAT).   FOLLOW UP: Return in about 2 weeks (around 10/22/2023).  She was informed of the importance of frequent follow up visits to maximize her success with intensive lifestyle modifications for her multiple health conditions.  Subjective:   Chief complaint:  Obesity Ranette is here to discuss her progress with her obesity treatment plan. She is on the the Category 3 Plan and states she is following her eating plan approximately 45% of the time. She states she is not exercising.  Interval History:  Guenevere A Kestner is here for a follow up office visit.     Since last office visit:  She states that she is getting better at buying and eating food on plan each time. She states that she is consuming a lot of chicken. She has bough some protein shakes to try when she runs out of food as she feels as the groceries on plan run out within a month. She does not walk outside and orders groceries online. She did not complete her goal set last visit of walking around the house every hour. She informed me on some days she doesn't drink water.  She typically drinks soda, tea, and coffee.   We reviewed her meal plan and all questions were answered.   Pharmacotherapy for weight loss: She is currently taking Wellbutrin for medical weight loss.  Denies side effects.    Review of Systems:  Pertinent positives were addressed with patient today.  Reviewed by clinician on day of visit: allergies, medications, problem list, medical history, surgical history, family history, social history, and previous encounter notes.  Weight Summary and Biometrics   Weight Lost Since Last Visit: 2lb  Weight Gained Since Last Visit: 0lb   Vitals Temp: 98.6 F (37 C) BP: 128/84 Pulse Rate: (!) 101 SpO2: 98 %   Anthropometric Measurements Height: 5\' 3"  (1.6 m) Weight: (!) 411 lb (186.4 kg) BMI (Calculated): 72.82 Weight at Last Visit: 413lb Weight Lost Since Last Visit: 2lb Weight Gained Since Last Visit: 0lb Starting Weight: 433lb Total Weight Loss (lbs): 22 lb (9.979 kg) Peak Weight: 421lb   Body Composition  Body Fat %: 66.2 % Fat Mass (lbs): 272.6 lbs Muscle Mass (lbs): 132.2 lbs Visceral Fat Rating : 33   Other Clinical Data Fasting: no Labs:  no Today's Visit #: 5 Starting Date: 07/17/23     Objective:   PHYSICAL EXAM: Blood pressure 128/84, pulse (!) 101, temperature 98.6 F (37 C), height 5\' 3"  (1.6 m), weight (!) 411 lb (186.4 kg), SpO2 98%. Body mass index is 72.81 kg/m.  General: Well Developed, well nourished, and in no acute distress.  HEENT: Normocephalic, atraumatic Skin: Warm and dry, cap RF less 2 sec, good turgor Chest:  Normal excursion, shape, no gross abn Respiratory: speaking in full sentences, no conversational dyspnea NeuroM-Sk: Ambulates w/o assistance, moves * 4 Psych: A and O *3, insight good, mood-full  DIAGNOSTIC DATA REVIEWED:  BMET    Component Value Date/Time   NA 140 07/17/2023 1054   K 4.8 07/17/2023 1054   CL 106 07/17/2023 1054   CO2 21 07/17/2023 1054   GLUCOSE 95 07/17/2023 1054   GLUCOSE 112 (H) 04/29/2023 2253   BUN 7 07/17/2023 1054   CREATININE 0.56 (L) 07/17/2023 1054   CREATININE 0.47 02/11/2023 1122   CREATININE 0.48 (L) 03/28/2015 1450   CALCIUM 9.1 07/17/2023 1054   GFRNONAA >60  04/29/2023 2253   GFRNONAA >60 02/11/2023 1122   GFRNONAA >89 03/28/2015 1450   GFRAA 146 01/17/2021 1420   GFRAA >89 03/28/2015 1450   Lab Results  Component Value Date   HGBA1C 5.9 (H) 07/17/2023   HGBA1C 5.30 03/28/2015   Lab Results  Component Value Date   INSULIN 19.1 07/17/2023   Lab Results  Component Value Date   TSH 2.140 07/17/2023   CBC    Component Value Date/Time   WBC 7.0 07/17/2023 1054   WBC 10.1 04/29/2023 2253   RBC 4.78 07/17/2023 1054   RBC 5.03 04/29/2023 2253   HGB 7.5 (L) 07/17/2023 1054   HCT 29.5 (L) 07/17/2023 1054   PLT 483 (H) 07/17/2023 1054   MCV 62 (L) 07/17/2023 1054   MCH 15.7 (L) 07/17/2023 1054   MCH 15.9 (L) 04/29/2023 2253   MCHC 25.4 (L) 07/17/2023 1054   MCHC 27.0 (L) 04/29/2023 2253   RDW 20.4 (H) 07/17/2023 1054   Iron Studies    Component Value Date/Time   IRON 11 (L) 02/11/2023 1122   IRON 11 (L) 01/29/2023 1639   TIBC  414 02/11/2023 1122   TIBC 331 01/29/2023 1639   FERRITIN 6 (L) 02/11/2023 1122   FERRITIN 11 (L) 01/29/2023 1639   IRONPCTSAT 3 (L) 02/11/2023 1122   IRONPCTSAT 3 (LL) 01/29/2023 1639   IRONPCTSAT 1 (L) 02/13/2017 1517   Lipid Panel     Component Value Date/Time   CHOL 164 07/17/2023 1054   TRIG 86 07/17/2023 1054   HDL 43 07/17/2023 1054   CHOLHDL 3.8 07/17/2023 1054   LDLCALC 105 (H) 07/17/2023 1054   Hepatic Function Panel     Component Value Date/Time   PROT 7.5 07/17/2023 1054   ALBUMIN 3.8 (L) 07/17/2023 1054   AST 9 07/17/2023 1054   AST 9 (L) 02/11/2023 1122   ALT 12 07/17/2023 1054   ALT 10 02/11/2023 1122   ALKPHOS 118 07/17/2023 1054   BILITOT 0.2 07/17/2023 1054   BILITOT 0.4 02/11/2023 1122   BILIDIR <0.1 04/30/2023 0916   IBILI NOT CALCULATED 04/30/2023 0916      Component Value Date/Time   TSH 2.140 07/17/2023 1054   Nutritional Lab Results  Component Value Date   VD25OH 11.1 (L) 07/17/2023   VD25OH 8.7 (L) 01/29/2023   VD25OH 14.9 (L) 11/19/2017    Attestations:   I, Clinical biochemist, acting as a Stage manager for [employer], DO., have compiled all relevant documentation for today's office visit on behalf of Thomasene Lot, DO, while in the presence of [employer], DO.  I have reviewed the above documentation for accuracy and completeness, and I agree with the above. Carlye Grippe, D.O.  The 21st Century Cures Act was signed into law in 2016 which includes the topic of electronic health records.  This provides immediate access to information in MyChart.  This includes consultation notes, operative notes, office notes, lab results and pathology reports.  If you have any questions about what you read please let us know at your next visit so we can discuss your concerns and take corrective action if need be.  We are right here with you.

## 2023-10-15 ENCOUNTER — Encounter: Payer: Self-pay | Admitting: Internal Medicine

## 2023-10-19 NOTE — Progress Notes (Deleted)
10/20/23- 32 yoF for sleep evaluation courtesy of Dr Seymour Bars with concern of OSA and Narcolepsy w/o Cataplexy Complicated by Morbid Obesity, Venous Stasis Dermatitis, Migraine, Allergic Rhinitis, GERD, Osteoarthritis, Dyspnea on Exertion, Depression, ADHD,  Sleep study in 2022 was not done. NPSG 10/07/16- AHI 5.6/hr(minimal ), desaturation to 82%, body weight 398 lbs MSLT 01/08/17- mean latency 00:03 minutes with 5/5 naps and % SOREMS, strongly compatible with severe Narcolepsy, although not done right after NPSG. -Adderall 20 mg daily, Wegovy, Wellbutrin Epworth score-  Body weight today-

## 2023-10-20 ENCOUNTER — Institutional Professional Consult (permissible substitution): Payer: Commercial Managed Care - HMO | Admitting: Internal Medicine

## 2023-10-23 ENCOUNTER — Encounter: Payer: Self-pay | Admitting: Internal Medicine

## 2023-10-27 ENCOUNTER — Encounter (INDEPENDENT_AMBULATORY_CARE_PROVIDER_SITE_OTHER): Payer: Self-pay | Admitting: Adult Health

## 2023-10-27 ENCOUNTER — Other Ambulatory Visit: Payer: Self-pay

## 2023-10-27 ENCOUNTER — Telehealth (INDEPENDENT_AMBULATORY_CARE_PROVIDER_SITE_OTHER): Payer: Managed Care, Other (non HMO) | Admitting: Adult Health

## 2023-10-27 ENCOUNTER — Other Ambulatory Visit (HOSPITAL_COMMUNITY): Payer: Self-pay

## 2023-10-27 VITALS — Ht 63.0 in

## 2023-10-27 DIAGNOSIS — R632 Polyphagia: Secondary | ICD-10-CM

## 2023-10-27 DIAGNOSIS — R051 Acute cough: Secondary | ICD-10-CM | POA: Diagnosis not present

## 2023-10-27 DIAGNOSIS — E559 Vitamin D deficiency, unspecified: Secondary | ICD-10-CM

## 2023-10-27 DIAGNOSIS — E669 Obesity, unspecified: Secondary | ICD-10-CM | POA: Diagnosis not present

## 2023-10-27 DIAGNOSIS — Z6841 Body Mass Index (BMI) 40.0 and over, adult: Secondary | ICD-10-CM

## 2023-10-27 MED ORDER — VITAMIN D (ERGOCALCIFEROL) 1.25 MG (50000 UNIT) PO CAPS
50000.0000 [IU] | ORAL_CAPSULE | ORAL | 0 refills | Status: DC
  Filled 2023-10-27: qty 10, 35d supply, fill #0

## 2023-10-27 MED ORDER — WEGOVY 0.25 MG/0.5ML ~~LOC~~ SOAJ
0.2500 mg | SUBCUTANEOUS | 0 refills | Status: DC
  Filled 2023-10-27 – 2023-11-17 (×2): qty 2, 28d supply, fill #0

## 2023-10-27 NOTE — Progress Notes (Signed)
WEIGHT SUMMARY AND BIOMETRICS  No data recorded No data recorded No data recorded No data recorded  Chief Complaint:   OBESITY I connected with  [name] on 10/27/23 by a video and audio enabled telemedicine application and verified that I am speaking with the correct person using two identifiers.  Patient Location: Home  Provider Location: Office/Clinic  I discussed the limitations of evaluation and management by telemedicine. The patient expressed understanding and agreed to proceed.  [name] is here to discuss her progress with her obesity treatment plan. [name] is on the the Category 3 Plan and states [name] is following her eating plan approximately 45 % of the time.  [name] states [name] is not currently exercising due to acute illness   Interim History:  [name] reports acute sx's began 24 hours ago. [name] is experiencing pharyngitis, productive cough (mucous streaked with blood), body aches, feeling warm ([name] has not checked oral temp at home). [name] has been treating acute sx's with rest and OTC Acetaminophen 3 tabs Q 6 hours [name] reports that her 70 year old brother has similar sx's. [name] has tested for COVID-19 injection  [name] denies CP or dyspnea at present  [name] lives with her mother and 3 year old brother  Subjective:   1. Vitamin D deficiency  Latest Reference Range & Units 07/17/23 10:54  Vitamin D, 25-Hydroxy 30.0 - 100.0 ng/mL 11.1 (L)  (L): Data is abnormally low  [name] endorses fatigue r/t acute URI [name] is on weekly Ergocalciferol- denies N/V/Muscle Weakness  2. Polyphagia [name] has started [facility] 0.25mg - reports suppressed appetite and bloating. Denies mass in neck, dysphagia, dyspepsia, persistent hoarseness, abdominal pain, or N/V/C   3. Acute Cough Ms. [name] reports acute sx's began 24 hours ago. [name] is experiencing pharyngitis, productive cough (mucous streaked with blood), body aches, feeling warm ([name] has not checked oral temp at  home). [name] has been treating acute sx's with rest and OTC Acetaminophen 3 tabs Q 6 hours [name] reports that her 35 year old brother has similar sx's. [name] has tested for COVID-19 injection  [name] denies CP or dyspnea at present  Of note- [name] appears lethargic via video connection [name] did not appear in acute respiratory/cardiac distress Mental status intact  Due to blood streaked mucous- RECOMMEND TO BE EVALUATED BY LOCAL UC/ED  Assessment/Plan:   1. Vitamin D deficiency Refill - Vitamin D, Ergocalciferol, (DRISDOL) 1.25 MG (50000 UNIT) CAPS capsule; Take 1 capsule (50,000 Units total) by mouth 2 (two) times a week.  Dispense: 10 capsule; Refill: 0  2. Polyphagia Refill - Semaglutide-Weight Management (WEGOVY) 0.25 MG/0.5ML SOAJ; Inject 0.25 mg into the skin once a week.  Dispense: 2 mL; Refill: 0  3. Acute Cough SEEK MEDICAL ATTENTION Have a family member take you to nearest UC/ED If [place] sx's develop- call 911 Pt verbalized understanding and agreement  3. BMI 70 and over, adult (HCC), Current BMI 72.82 Refill - Semaglutide-Weight Management (WEGOVY) 0.25 MG/0.5ML SOAJ; Inject 0.25 mg into the skin once a week.  Dispense: 2 mL; Refill: 0  Remain on loafing dose another month to allow body to adjust GLP-1 therapy  [name] is currently in the action stage of change. As such, her goal is to continue with weight loss efforts. [name] has agreed to the Category 3 Plan.   Exercise goals: No exercise has been prescribed at this time.  Behavioral modification strategies: increasing lean protein intake, decreasing simple carbohydrates, increasing vegetables, increasing water intake, no  skipping meals, meal planning and cooking strategies, and planning for success.  [name] has agreed to follow-up with our clinic in 4 weeks. [name] was informed of the importance of frequent follow-up visits to maximize her success with intensive lifestyle modifications for her multiple health conditions.    RECOMMEND THAT [name] BE EVALUATED BY LOCAL UC/ED TODAY- pt verbalized understanding and agreement  Objective:   There were no vitals taken for this visit. There is no height or weight on file to calculate BMI.  General: Cooperative, alert, well developed, in no acute distress. HEENT: Conjunctivae and lids unremarkable. Cardiovascular: Regular rhythm.  Lungs: Normal work of breathing. Neurologic: No focal deficits.   Lab Results  Component Value Date   CREATININE 0.56 (L) 07/17/2023   BUN 7 07/17/2023   NA 140 07/17/2023   K 4.8 07/17/2023   CL 106 07/17/2023   CO2 21 07/17/2023   Lab Results  Component Value Date   ALT 12 07/17/2023   AST 9 07/17/2023   ALKPHOS 118 07/17/2023   BILITOT 0.2 07/17/2023   Lab Results  Component Value Date   HGBA1C 5.9 (H) 07/17/2023   HGBA1C 5.6 08/01/2022   HGBA1C 5.6 01/17/2021   HGBA1C 5.6 12/29/2017   HGBA1C 5.7 01/10/2017   Lab Results  Component Value Date   INSULIN 19.1 07/17/2023   Lab Results  Component Value Date   TSH 2.140 07/17/2023   Lab Results  Component Value Date   CHOL 164 07/17/2023   HDL 43 07/17/2023   LDLCALC 105 (H) 07/17/2023   TRIG 86 07/17/2023   CHOLHDL 3.8 07/17/2023   Lab Results  Component Value Date   VD25OH 11.1 (L) 07/17/2023   VD25OH 8.7 (L) 01/29/2023   VD25OH 14.9 (L) 11/19/2017   Lab Results  Component Value Date   WBC 7.0 07/17/2023   HGB 7.5 (L) 07/17/2023   HCT 29.5 (L) 07/17/2023   MCV 62 (L) 07/17/2023   PLT 483 (H) 07/17/2023   Lab Results  Component Value Date   IRON 11 (L) 02/11/2023   TIBC 414 02/11/2023   FERRITIN 6 (L) 02/11/2023    Attestation Statements:   Reviewed by clinician on day of visit: allergies, medications, problem list, medical history, surgical history, family history, social history, and previous encounter notes.  I have reviewed the above documentation for accuracy and completeness, and I agree with the above. -  Briya Lookabaugh d. Antawn Sison, NP-C

## 2023-10-28 ENCOUNTER — Telehealth (INDEPENDENT_AMBULATORY_CARE_PROVIDER_SITE_OTHER): Payer: Commercial Managed Care - HMO | Admitting: Psychology

## 2023-10-28 DIAGNOSIS — F411 Generalized anxiety disorder: Secondary | ICD-10-CM

## 2023-10-28 DIAGNOSIS — F5089 Other specified eating disorder: Secondary | ICD-10-CM

## 2023-10-28 DIAGNOSIS — F330 Major depressive disorder, recurrent, mild: Secondary | ICD-10-CM | POA: Diagnosis not present

## 2023-10-28 DIAGNOSIS — F909 Attention-deficit hyperactivity disorder, unspecified type: Secondary | ICD-10-CM

## 2023-10-28 NOTE — Progress Notes (Signed)
  Office: [phone]  /  Fax: [phone]    Date: October 28, 2023  Appointment Start Time: 2:40pm Duration: 17 minutes Provider: Lawerance Cruel, Psy.D. Type of Session: Individual Therapy  Location of Patient: Home (private location) Location of Provider: Provider's Home (private office) Type of Contact: Telepsychological Visit via MyChart Video Visit  Session Content:This provider called [name] at 2:36pm as she did not present for today's appointment. She requested to meet despite it being a shorter appointment. As such, today's appointment was initiated 10 minutes late. [name] is a 32 y.o. female presenting for a follow-up appointment to address the previously established treatment goal of increasing coping skills.Today's appointment was a telepsychological visit. [name] provided verbal consent for today's telepsychological appointment and she is aware she is responsible for securing confidentiality on her end of the session. Prior to proceeding with today's appointment, [name]'s physical location at the time of this appointment was obtained as well a phone number she could be reached at in the event of technical difficulties. [name] and this provider participated in today's telepsychological service.   Of note, today's appointment was switched to a regular telephone call at 2:49pm with [name]'s verbal consent due to technical issues.   This provider conducted a brief check-in. [name] shared she is sick. She further shared she started [facility], noting it is "fine." A risk assessment was completed. [name] denied experiencing suicidal, self-harm and homicidal ideation, plan, and intent since the last appointment with this provider. She continues to acknowledge understanding regarding the importance of reaching out to trusted individuals and/or emergency resources if she is unable to ensure safety. Reviewed triggers for emotional eating behaviors. She noted, "I  think I'm doing better." However, she acknowledged stress is a trigger for emotional eating behaviors; therefore, stress management was discussed. [name] was receptive to today's appointment as evidenced by openness to sharing, responsiveness to feedback, and willingness to implement discussed strategies .  Mental Status Examination:  Appearance: neat; appeared tired Behavior: appropriate to circumstances Mood: sad Affect: mood congruent Speech: WNL Eye Contact: appropriate Psychomotor Activity: WNL Gait: unable to assess Thought Process: linear, logical, and goal directed and denies suicidal, homicidal, and self-harm ideation, plan and intent  Thought Content/Perception: no hallucinations, delusions, bizarre thinking or behavior endorsed or observed Orientation: AAOx4 Memory/Concentration: intact Insight: fair Judgment: fair  Interventions:  Conducted a brief chart review Conducted a risk assessment Provided empathic reflections and validation Reviewed content from the previous session Employed supportive psychotherapy interventions to facilitate reduced distress and to improve coping skills with identified stressors Engaged patient in problem solving  DSM-5 Diagnosis(es):  F50.89 Other Specified Feeding or Eating Disorder, Emotional Eating Behaviors, F41.1 Generalized Anxiety Disorder, F33.0 Major Depressive Disorder, Recurrent Episode, Mild, and F90.9 Unspecified Attention-Deficit/Hyperactivity Disorder   Treatment Goal & Progress: During the initial appointment with this provider, the following treatment goal was established: increase coping skills. [name] has demonstrated progress in her goal as evidenced by increased awareness of hunger patterns and increased awareness of triggers for emotional eating behaviors.   Plan: The next appointment is scheduled for 12/01/2023 at 2pm, which will be via MyChart Video Visit. The next session will focus on working towards the  established treatment goal. [name] noted a plan to follow-up with [facility] to establish care.

## 2023-11-10 ENCOUNTER — Encounter: Payer: Self-pay | Admitting: Internal Medicine

## 2023-11-12 ENCOUNTER — Encounter: Payer: Self-pay | Admitting: Internal Medicine

## 2023-11-17 ENCOUNTER — Ambulatory Visit: Payer: Commercial Managed Care - HMO | Attending: Family Medicine | Admitting: Family Medicine

## 2023-11-17 ENCOUNTER — Encounter: Payer: Self-pay | Admitting: Family Medicine

## 2023-11-17 ENCOUNTER — Encounter: Payer: Self-pay | Admitting: Internal Medicine

## 2023-11-17 ENCOUNTER — Other Ambulatory Visit: Payer: Self-pay

## 2023-11-17 VITALS — BP 121/85 | HR 98 | Ht 63.0 in | Wt >= 6400 oz

## 2023-11-17 DIAGNOSIS — E66813 Obesity, class 3: Secondary | ICD-10-CM

## 2023-11-17 DIAGNOSIS — D509 Iron deficiency anemia, unspecified: Secondary | ICD-10-CM

## 2023-11-17 DIAGNOSIS — Z0001 Encounter for general adult medical examination with abnormal findings: Secondary | ICD-10-CM

## 2023-11-17 DIAGNOSIS — Z6841 Body Mass Index (BMI) 40.0 and over, adult: Secondary | ICD-10-CM | POA: Diagnosis not present

## 2023-11-17 NOTE — Patient Instructions (Addendum)
Placed in [facility]  [address]  [place], [place] [address] [phone]
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
## 2023-11-19 ENCOUNTER — Ambulatory Visit (INDEPENDENT_AMBULATORY_CARE_PROVIDER_SITE_OTHER): Payer: Commercial Managed Care - HMO | Admitting: Family Medicine

## 2023-11-24 ENCOUNTER — Encounter: Payer: Self-pay | Admitting: Internal Medicine

## 2023-11-24 ENCOUNTER — Ambulatory Visit: Payer: Commercial Managed Care - HMO | Attending: Nurse Practitioner

## 2023-11-24 ENCOUNTER — Ambulatory Visit (INDEPENDENT_AMBULATORY_CARE_PROVIDER_SITE_OTHER): Payer: Commercial Managed Care - HMO | Admitting: Family Medicine

## 2023-11-24 DIAGNOSIS — D509 Iron deficiency anemia, unspecified: Secondary | ICD-10-CM

## 2023-11-25 ENCOUNTER — Other Ambulatory Visit: Payer: Self-pay | Admitting: Family Medicine

## 2023-11-25 ENCOUNTER — Other Ambulatory Visit: Payer: Self-pay

## 2023-11-25 ENCOUNTER — Other Ambulatory Visit (HOSPITAL_COMMUNITY): Payer: Self-pay

## 2023-11-25 LAB — CBC WITH DIFFERENTIAL/PLATELET
Basophils Absolute: 0.1 10*3/uL (ref 0.0–0.2)
Basos: 1 %
EOS (ABSOLUTE): 0.1 10*3/uL (ref 0.0–0.4)
Eos: 1 %
Hematocrit: 28.1 % — ABNORMAL LOW (ref 34.0–46.6)
Hemoglobin: 7.4 g/dL — ABNORMAL LOW (ref 11.1–15.9)
Immature Grans (Abs): 0 10*3/uL (ref 0.0–0.1)
Immature Granulocytes: 0 %
Lymphocytes Absolute: 2.5 10*3/uL (ref 0.7–3.1)
Lymphs: 28 %
MCH: 15.2 pg — ABNORMAL LOW (ref 26.6–33.0)
MCHC: 26.3 g/dL — ABNORMAL LOW (ref 31.5–35.7)
MCV: 58 fL — ABNORMAL LOW (ref 79–97)
Monocytes Absolute: 1 10*3/uL — ABNORMAL HIGH (ref 0.1–0.9)
Monocytes: 11 %
Neutrophils Absolute: 5.4 10*3/uL (ref 1.4–7.0)
Neutrophils: 59 %
Platelets: 560 10*3/uL — ABNORMAL HIGH (ref 150–450)
RBC: 4.87 x10E6/uL (ref 3.77–5.28)
RDW: 21.3 % — ABNORMAL HIGH (ref 11.7–15.4)
WBC: 9.1 10*3/uL (ref 3.4–10.8)

## 2023-11-25 MED ORDER — IRON (FERROUS SULFATE) 325 (65 FE) MG PO TABS
325.0000 mg | ORAL_TABLET | Freq: Every day | ORAL | 3 refills | Status: DC
  Filled 2023-11-25: qty 60, 60d supply, fill #0
  Filled 2023-11-25: qty 60, 30d supply, fill #0

## 2023-11-27 ENCOUNTER — Ambulatory Visit (INDEPENDENT_AMBULATORY_CARE_PROVIDER_SITE_OTHER): Payer: Commercial Managed Care - HMO | Admitting: Family Medicine

## 2023-12-01 ENCOUNTER — Encounter: Payer: Self-pay | Admitting: Internal Medicine

## 2023-12-01 ENCOUNTER — Telehealth (INDEPENDENT_AMBULATORY_CARE_PROVIDER_SITE_OTHER): Payer: Commercial Managed Care - HMO | Admitting: Psychology

## 2023-12-01 ENCOUNTER — Ambulatory Visit (INDEPENDENT_AMBULATORY_CARE_PROVIDER_SITE_OTHER): Payer: Commercial Managed Care - HMO | Admitting: Family Medicine

## 2023-12-01 DIAGNOSIS — F330 Major depressive disorder, recurrent, mild: Secondary | ICD-10-CM | POA: Diagnosis not present

## 2023-12-01 DIAGNOSIS — F5089 Other specified eating disorder: Secondary | ICD-10-CM | POA: Diagnosis not present

## 2023-12-01 DIAGNOSIS — F411 Generalized anxiety disorder: Secondary | ICD-10-CM

## 2023-12-01 DIAGNOSIS — F909 Attention-deficit hyperactivity disorder, unspecified type: Secondary | ICD-10-CM | POA: Diagnosis not present

## 2023-12-01 NOTE — Progress Notes (Signed)
  Office: [phone]  /  Fax: [phone]    Date: December 01, 2023  Appointment Start Time: 2:05pm Duration: 19 minutes Provider: Lawerance Cruel, Psy.D. Type of Session: Individual Therapy  Location of Patient: Home (private location) Location of Provider: Provider's Home (private office) Type of Contact: Telepsychological Visit via MyChart Video Visit  Session Content: This provider called [name] at 2:02pm as she did not present for today's appointment. She stated she was in the process of joining. As such, today's appointment was initiated 5 minutes late. [name] is a 32 y.o. female presenting for a follow-up appointment to address the previously established treatment goal of increasing coping skills.Today's appointment was a telepsychological visit. Kyler provided verbal consent for today's telepsychological appointment and she is aware she is responsible for securing confidentiality on her end of the session. Prior to proceeding with today's appointment, [name]'s physical location at the time of this appointment was obtained as well a phone number she could be reached at in the event of technical difficulties. [name] and this provider participated in today's telepsychological service.   This provider conducted a brief check-in. [name] shared she has "not been feeling well." She was encouraged to reach out to her PCP; she agreed. Additionally, she stated she is trying to focus on protein intake, but described challenges with "eating enough." She was engaged in problem solving to help increase intake. Also, discussed what went well as it relates to eating on Thanksgiving and what she could differently during future holidays. [name] was receptive to today's appointment as evidenced by openness to sharing, responsiveness to feedback, and willingness to implement discussed strategies .  Mental Status Examination:  Appearance: neat Behavior: appropriate to  circumstances Mood: sad Affect: mood congruent Speech: WNL Eye Contact: intermittent  Psychomotor Activity: WNL Gait: unable to assess Thought Process: linear, logical, and goal directed and denies suicidal, homicidal, and self-harm ideation, plan and intent  Thought Content/Perception: no hallucinations, delusions, bizarre thinking or behavior endorsed or observed Orientation: AAOx4 Memory/Concentration: intact Insight: fair Judgment: fair  Interventions:  Conducted a brief chart review Conducted a risk assessment Provided empathic reflections and validation Reviewed content from the previous session Employed supportive psychotherapy interventions to facilitate reduced distress and to improve coping skills with identified stressors Engaged patient in problem solving  DSM-5 Diagnosis(es):  F50.89 Other Specified Feeding or Eating Disorder, Emotional Eating Behaviors, F41.1 Generalized Anxiety Disorder, F33.0 Major Depressive Disorder, Recurrent Episode, Mild, and F90.9 Unspecified Attention-Deficit/Hyperactivity Disorder   Treatment Goal & Progress: During the initial appointment with this provider, the following treatment goal was established: increase coping skills. [name] has demonstrated progress in her goal as evidenced by increased awareness of hunger patterns and increased awareness of triggers for emotional eating behaviors.   Plan: The next appointment is scheduled for 01/05/2024 at 2pm, which will be via MyChart Video Visit. The next session will focus on working towards the established treatment goal. [name] was encouraged to follow-up with [facility] to establish care.

## 2023-12-03 ENCOUNTER — Other Ambulatory Visit: Payer: Self-pay

## 2023-12-03 ENCOUNTER — Ambulatory Visit (INDEPENDENT_AMBULATORY_CARE_PROVIDER_SITE_OTHER): Payer: Commercial Managed Care - HMO | Admitting: Family Medicine

## 2023-12-03 ENCOUNTER — Encounter (INDEPENDENT_AMBULATORY_CARE_PROVIDER_SITE_OTHER): Payer: Self-pay | Admitting: Adult Health

## 2023-12-03 ENCOUNTER — Ambulatory Visit (INDEPENDENT_AMBULATORY_CARE_PROVIDER_SITE_OTHER): Payer: Commercial Managed Care - HMO | Admitting: Adult Health

## 2023-12-03 VITALS — BP 100/68 | HR 92 | Temp 98.1°F | Ht 63.0 in | Wt >= 6400 oz

## 2023-12-03 DIAGNOSIS — E669 Obesity, unspecified: Secondary | ICD-10-CM

## 2023-12-03 DIAGNOSIS — E559 Vitamin D deficiency, unspecified: Secondary | ICD-10-CM | POA: Diagnosis not present

## 2023-12-03 DIAGNOSIS — G47419 Narcolepsy without cataplexy: Secondary | ICD-10-CM | POA: Diagnosis not present

## 2023-12-03 DIAGNOSIS — R632 Polyphagia: Secondary | ICD-10-CM

## 2023-12-03 DIAGNOSIS — R7303 Prediabetes: Secondary | ICD-10-CM

## 2023-12-03 DIAGNOSIS — Z6841 Body Mass Index (BMI) 40.0 and over, adult: Secondary | ICD-10-CM

## 2023-12-03 MED ORDER — WEGOVY 0.25 MG/0.5ML ~~LOC~~ SOAJ
0.2500 mg | SUBCUTANEOUS | 0 refills | Status: DC
  Filled 2023-12-03: qty 2, 28d supply, fill #0

## 2023-12-03 MED ORDER — VITAMIN D (ERGOCALCIFEROL) 1.25 MG (50000 UNIT) PO CAPS
50000.0000 [IU] | ORAL_CAPSULE | ORAL | 0 refills | Status: AC
  Filled 2023-12-03: qty 8, 28d supply, fill #0

## 2023-12-03 NOTE — Progress Notes (Signed)
WEIGHT SUMMARY AND BIOMETRICS  Vitals Temp: 98.1 F (36.7 C) BP: 100/68 Pulse Rate: 92 SpO2: 97 %   Anthropometric Measurements Height: 5\' 3"  (1.6 m) Weight: (!) 409 lb (185.5 kg) BMI (Calculated): 72.47 Weight at Last Visit: 411lb Weight Lost Since Last Visit: 2lb Weight Gained Since Last Visit: 0 Starting Weight: 433lb Total Weight Loss (lbs): 24 lb (10.9 kg) Peak Weight: 421lb   Body Composition  Body Fat %: 67.6 % Fat Mass (lbs): 277 lbs Muscle Mass (lbs): 126.2 lbs Visceral Fat Rating : 34   Other Clinical Data Fasting: no Labs: no Today's Visit #: 7 Starting Date: 07/17/23    Chief Complaint:   OBESITY [name] is here to discuss her progress with her obesity treatment plan. She is on the the Category 3 Plan and states she is following her eating plan approximately 40 % of the time.  She states she is not currently exercising.   Interim History:  [name] lives with her mother, age 70. Mother is on disability- unsure of condition for disability. [name] is not currently working or attending school. She is unable to drive due to Narcolepsy dx  She provided the following food recall typical of a day Wakes at noon Breakfast 1300: Eggs Dinner 2000: Meat protein, either chicken or ground beef  Exercise-she has been trying to walk to her mailbox a few times per week.  Hydration-she estimates to drink between sips of water and 2 16.9 oz water bottles/day  Of note- she ambulates with a cane due to bilateral knee pain. She reports L knee pain more significant than R knee  Subjective:   1. Prediabetes Lab Results  Component Value Date   HGBA1C 5.9 (H) 07/17/2023   HGBA1C 5.6 08/01/2022   HGBA1C 5.6 01/17/2021    Started Wegovy 0.25mg  on/about 07/31/2023 Due to suppressed appetite- she has remained on loading dose Denies mass in neck, dysphagia, dyspepsia, persistent hoarseness, abdominal pain, or N/V/C   2. Polyphagia [name]  actually reports suppressed appetite since starting weekly Wegovy 0.25mg  (started on/about 07/31/2023). Most days she also drinks very little water.  3. Vitamin D deficiency  Latest Reference Range & Units 01/29/23 16:39 07/17/23 10:54  Vitamin D, 25-Hydroxy 30.0 - 100.0 ng/mL 8.7 (L) 11.1 (L)  (L): Data is abnormally low  She endorses chronic fatigue She is on bi-weekly Ergocalciferol- denies N/V/Muscle Weakness  4. Narcolepsy without cataplexy Pt estimates to have been dx'd with Narcolepsy in 2007  She uses daily Adderall 20mg , not currently followed by Neruology   She endorses worsening memory deficits "for years" per pt. She is unable to provide time estimate when decline started   Gretchen Short, NP-C placed Neuro Referral- Referral Response: This is [place] with [facility]. Thank you for the referral for [name]. I went ahead and had the referral reviewed by one of the providers at my office due to the patients age and complaints. Per his review, he is going to be declining this referral. He stated that the memory complaint is likely secondary to her ADHD, Depression and Anxiety. He is recommending continued treatment of her psychiatric conditions.    PDMP reviewed- Regular monthly refills of Adderall 20mg   Assessment/Plan:   1. Prediabetes Increase total caloric intake  2. Polyphagia Increase compliance on Cat 3 Meal plan- additional copies of plan, grocery lists, and snack sheets provided today.  3. Vitamin D deficiency (Primary) Refill Vitamin D, Ergocalciferol, (DRISDOL) 1.25 MG (50000 UNIT) CAPS capsule (Future)  Take 1 capsule (50,000 Units total) by mouth 2 (two) times a week. Dispense: 10 capsule, Refills: 0 of 0 remaining   4. Narcolepsy without cataplexy Continue daily stimulant therapy Practice good sleep hygiene  5. BMI 70 and over, adult [facility]), Current BMI 72.47 Refill  [name] is currently in the action stage of change. As such, her goal is to continue  with weight loss efforts. She has agreed to the Category 3 Plan.   Exercise goals: All adults should avoid inactivity. Some physical activity is better than none, and adults who participate in any amount of physical activity gain some health benefits. Adults should also include muscle-strengthening activities that involve all major muscle groups on 2 or more days a week.  Behavioral modification strategies: increasing lean protein intake, decreasing simple carbohydrates, increasing vegetables, increasing water intake, no skipping meals, meal planning and cooking strategies, keeping healthy foods in the home, and holiday eating strategies .  [name] has agreed to follow-up with our clinic in 4 weeks. She was informed of the importance of frequent follow-up visits to maximize her success with intensive lifestyle modifications for her multiple health conditions.   Check Fasting Labs at next OV  Objective:   Blood pressure 100/68, pulse 92, temperature 98.1 F (36.7 C), height 5\' 3"  (1.6 m), weight (!) 409 lb (185.5 kg), SpO2 97%. Body mass index is 72.45 kg/m.  General: Cooperative, alert, well developed, in no acute distress. HEENT: Conjunctivae and lids unremarkable. Cardiovascular: Regular rhythm.  Lungs: Normal work of breathing. Neurologic: No focal deficits.   Lab Results  Component Value Date   CREATININE 0.56 (L) 07/17/2023   BUN 7 07/17/2023   NA 140 07/17/2023   K 4.8 07/17/2023   CL 106 07/17/2023   CO2 21 07/17/2023   Lab Results  Component Value Date   ALT 12 07/17/2023   AST 9 07/17/2023   ALKPHOS 118 07/17/2023   BILITOT 0.2 07/17/2023   Lab Results  Component Value Date   HGBA1C 5.9 (H) 07/17/2023   HGBA1C 5.6 08/01/2022   HGBA1C 5.6 01/17/2021   HGBA1C 5.6 12/29/2017   HGBA1C 5.7 01/10/2017   Lab Results  Component Value Date   INSULIN 19.1 07/17/2023   Lab Results  Component Value Date   TSH 2.140 07/17/2023   Lab Results  Component Value Date    CHOL 164 07/17/2023   HDL 43 07/17/2023   LDLCALC 105 (H) 07/17/2023   TRIG 86 07/17/2023   CHOLHDL 3.8 07/17/2023   Lab Results  Component Value Date   VD25OH 11.1 (L) 07/17/2023   VD25OH 8.7 (L) 01/29/2023   VD25OH 14.9 (L) 11/19/2017   Lab Results  Component Value Date   WBC 9.1 11/24/2023   HGB 7.4 (L) 11/24/2023   HCT 28.1 (L) 11/24/2023   MCV 58 (L) 11/24/2023   PLT 560 (H) 11/24/2023   Lab Results  Component Value Date   IRON 11 (L) 02/11/2023   TIBC 414 02/11/2023   FERRITIN 6 (L) 02/11/2023    Attestation Statements:   Reviewed by clinician on day of visit: allergies, medications, problem list, medical history, surgical history, family history, social history, and previous encounter notes.  I have reviewed the above documentation for accuracy and completeness, and I agree with the above. -  Kinzie Wickes d. Chace Klippel, NP-C

## 2023-12-04 ENCOUNTER — Other Ambulatory Visit: Payer: Self-pay

## 2023-12-09 ENCOUNTER — Encounter: Payer: Self-pay | Admitting: Internal Medicine

## 2023-12-15 ENCOUNTER — Ambulatory Visit: Payer: Self-pay

## 2023-12-15 ENCOUNTER — Other Ambulatory Visit: Payer: Self-pay

## 2023-12-15 NOTE — Telephone Encounter (Signed)
  Chief Complaint: Abdominal pain Symptoms: L sided abdominal pain, nausea Frequency: 2-3 weeks  Pertinent Negatives: Patient denies vomiting or diarrhea  Disposition: [] ED /[] Urgent Care (no appt availability in office) / [] Appointment(In office/virtual)/ []  [facility]/ [] Home Care/ [x] Refused Recommended Disposition /[] [facility]/ []  Follow-up with PCP Additional Notes: pt reports above sx. No appts until 01/07/24, offered MU tomorrow or UC. Pt preferred to schedule OV, appt scheduled for 01/07/24 at 410, advised if sx get worse to go to UC/ ED.   Reason for Disposition  [1] MILD-MODERATE pain AND [2] constant AND [3] present > 2 hours  Answer Assessment - Initial Assessment Questions 1. LOCATION: "Where does it hurt?"      Left abdominal pain  3. ONSET: "When did the pain begin?" (e.g., minutes, hours or days ago)      2-3 weeks  5. PATTERN "Does the pain come and go, or is it constant?"    - If it comes and goes: "How long does it last?" "Do you have pain now?"     (Note: Comes and goes means the pain is intermittent. It goes away completely between bouts.)    - If constant: "Is it getting better, staying the same, or getting worse?"      (Note: Constant means the pain never goes away completely; most serious pain is constant and gets worse.)      Constant  6. SEVERITY: "How bad is the pain?"  (e.g., Scale 1-10; mild, moderate, or severe)    - MILD (1-3): Doesn't interfere with normal activities, abdomen soft and not tender to touch.     - MODERATE (4-7): Interferes with normal activities or awakens from sleep, abdomen tender to touch.     - SEVERE (8-10): Excruciating pain, doubled over, unable to do any normal activities.       7/10 10. OTHER SYMPTOMS: "Do you have any other symptoms?" (e.g., back pain, diarrhea, fever, urination pain, vomiting)       Nausea  11. PREGNANCY: "Is there any chance you are pregnant?" "When was your last menstrual period?"        no  Protocols used: Abdominal Pain - [facility] - [facility]

## 2023-12-16 ENCOUNTER — Telehealth (HOSPITAL_COMMUNITY): Payer: Commercial Managed Care - HMO | Admitting: Psychiatry

## 2023-12-16 ENCOUNTER — Other Ambulatory Visit (HOSPITAL_COMMUNITY): Payer: Self-pay

## 2023-12-16 ENCOUNTER — Encounter (HOSPITAL_COMMUNITY): Payer: Self-pay | Admitting: Psychiatry

## 2023-12-16 ENCOUNTER — Other Ambulatory Visit: Payer: Self-pay

## 2023-12-16 DIAGNOSIS — F330 Major depressive disorder, recurrent, mild: Secondary | ICD-10-CM | POA: Diagnosis not present

## 2023-12-16 DIAGNOSIS — F411 Generalized anxiety disorder: Secondary | ICD-10-CM

## 2023-12-16 DIAGNOSIS — F900 Attention-deficit hyperactivity disorder, predominantly inattentive type: Secondary | ICD-10-CM

## 2023-12-16 MED ORDER — BUPROPION HCL ER (XL) 300 MG PO TB24
300.0000 mg | ORAL_TABLET | ORAL | 3 refills | Status: DC
  Filled 2023-12-16 (×2): qty 30, 30d supply, fill #0

## 2023-12-16 MED ORDER — AMPHETAMINE-DEXTROAMPHETAMINE 20 MG PO TABS
20.0000 mg | ORAL_TABLET | Freq: Every day | ORAL | 0 refills | Status: DC
  Filled 2023-12-16 (×2): qty 30, 30d supply, fill #0

## 2023-12-16 MED ORDER — HYDROXYZINE HCL 10 MG PO TABS
10.0000 mg | ORAL_TABLET | Freq: Three times a day (TID) | ORAL | 3 refills | Status: DC | PRN
  Filled 2023-12-16 (×2): qty 90, 30d supply, fill #0

## 2023-12-16 NOTE — Telephone Encounter (Signed)
 noted
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
## 2024-01-01 ENCOUNTER — Encounter: Payer: Self-pay | Admitting: Internal Medicine

## 2024-01-04 NOTE — Progress Notes (Deleted)
01/06/24- 32 yoF for sleep evaluation courtesy of Seymour Bars, DO with hx OSA, Narcolepsy, complicated by Venous Stasis Dermatitis, hx Blood Clots, Migraine, Allergic Rhinitis, GERD, Morbid Obesity, FeAnemia, Depression/ Anxiety, ADHD Meds include- Adderall 20 mg Original sleep studies MSLT 07/01/06- mean latency 0 minutes, SOREM 5/5    MSLT 01/08/17- mean latency 0.03 minutes, SOREM 5/5    NPSG 10/07/16- AHI 5.6/hr, desat to 82%, body weight 398 lbs

## 2024-01-05 ENCOUNTER — Telehealth (INDEPENDENT_AMBULATORY_CARE_PROVIDER_SITE_OTHER): Payer: Commercial Managed Care - HMO | Admitting: Psychology

## 2024-01-05 ENCOUNTER — Ambulatory Visit (INDEPENDENT_AMBULATORY_CARE_PROVIDER_SITE_OTHER): Payer: Commercial Managed Care - HMO | Admitting: Physician Assistant

## 2024-01-05 DIAGNOSIS — F330 Major depressive disorder, recurrent, mild: Secondary | ICD-10-CM | POA: Diagnosis not present

## 2024-01-05 DIAGNOSIS — F5089 Other specified eating disorder: Secondary | ICD-10-CM

## 2024-01-05 DIAGNOSIS — F909 Attention-deficit hyperactivity disorder, unspecified type: Secondary | ICD-10-CM | POA: Diagnosis not present

## 2024-01-05 DIAGNOSIS — E559 Vitamin D deficiency, unspecified: Secondary | ICD-10-CM

## 2024-01-05 DIAGNOSIS — D500 Iron deficiency anemia secondary to blood loss (chronic): Secondary | ICD-10-CM

## 2024-01-05 DIAGNOSIS — F411 Generalized anxiety disorder: Secondary | ICD-10-CM | POA: Diagnosis not present

## 2024-01-05 DIAGNOSIS — R632 Polyphagia: Secondary | ICD-10-CM

## 2024-01-05 DIAGNOSIS — R7303 Prediabetes: Secondary | ICD-10-CM

## 2024-01-05 NOTE — Progress Notes (Signed)
  Office: [phone]  /  Fax: [phone]    Date: January 05, 2024  Appointment Start Time: 2:05pm Duration: 21 minutes Provider: Lawerance Cruel, Psy.D. Type of Session: Individual Therapy  Location of Patient: Home (private location) Location of Provider: Provider's Home (private office) Type of Contact: Telepsychological Visit via MyChart Video Visit  Session Content: This provider called [name] at 2:03pm as she did not present for today's appointment. This provider could not leave a voicemail as the mailbox was full. She was observed joining shortly after. As such, today's appointment was initiated 5 minutes late. [name] is a 33 y.o. female presenting for a follow-up appointment to address the previously established treatment goal of increasing coping skills.Today's appointment was a telepsychological visit. [name] provided verbal consent for today's telepsychological appointment and she is aware she is responsible for securing confidentiality on her end of the session. Prior to proceeding with today's appointment, [name]'s physical location at the time of this appointment was obtained as well a phone number she could be reached at in the event of technical difficulties. [name] and this provider participated in today's telepsychological service.   Of note, today's appointment was switched to a regular telephone call at 2:09pm with [name]'s verbal consent due to technical issues on her end.    This provider conducted a brief check-in. [name] stated she continues to have issues with her stomach, noting a plan to discuss with her PCP. She also agreed to discuss further with [name] Bien, PA-C today. Due to the ongoing stomach concerns, she acknowledged challenges with her eating habits. Further explored and processed. She acknowledged she is focusing on protein intake, but also disclosed engagement in emotional eating behaviors. Psychoeducation regarding  pleasurable activities, including its impact on emotional eating and overall well-being was provided. [name] provided verbal consent during today's appointment for this provider to send a handout with activities via e-mail. Overall, [name] was receptive to today's appointment as evidenced by openness to sharing, responsiveness to feedback, and willingness to engage in pleasurable activities to assist with coping.  Mental Status Examination:  Appearance: neat Behavior: appropriate to circumstances Mood: neutral Affect: mood congruent Speech: WNL Eye Contact: appropriate Psychomotor Activity: WNL Gait: unable to assess Thought Process: linear, logical, and goal directed and denies suicidal, homicidal, and self-harm ideation, plan and intent  Thought Content/Perception: no hallucinations, delusions, bizarre thinking or behavior endorsed or observed Orientation: AAOx4 Memory/Concentration: intact Insight: fair Judgment: fair  Interventions:  Conducted a brief chart review Conducted a risk assessment Provided empathic reflections and validation Employed supportive psychotherapy interventions to facilitate reduced distress and to improve coping skills with identified stressors Psychoeducation provided regarding pleasurable activities  DSM-5 Diagnosis(es):  F50.89 Other Specified Feeding or Eating Disorder, Emotional Eating Behaviors, F41.1 Generalized Anxiety Disorder, F33.0 Major Depressive Disorder, Recurrent Episode, Mild, and F90.9 Unspecified Attention-Deficit/Hyperactivity Disorder   Treatment Goal & Progress: During the initial appointment with this provider, the following treatment goal was established: increase coping skills. [name] has demonstrated progress in her goal as evidenced by increased awareness of hunger patterns and increased awareness of triggers for emotional eating behaviors. [name] also demonstrates willingness to engage in pleasurable  activities.  Plan: The next appointment is scheduled for 01/26/2024 at 2:30pm, which will be via MyChart Video Visit. The next session will focus on working towards the established treatment goal. [name] will continue meeting with her psychiatric provider. A referral was placed again with  Behavioral Medicine with her verbal consent.    Lawerance Cruel, PsyD

## 2024-01-06 ENCOUNTER — Institutional Professional Consult (permissible substitution): Payer: Commercial Managed Care - HMO | Admitting: Internal Medicine

## 2024-01-07 ENCOUNTER — Encounter: Payer: Self-pay | Admitting: Physician Assistant

## 2024-01-07 ENCOUNTER — Ambulatory Visit: Payer: Commercial Managed Care - HMO | Admitting: Family Medicine

## 2024-01-07 ENCOUNTER — Other Ambulatory Visit (HOSPITAL_COMMUNITY): Payer: Self-pay

## 2024-01-07 ENCOUNTER — Ambulatory Visit: Payer: Commercial Managed Care - HMO | Attending: Physician Assistant | Admitting: Physician Assistant

## 2024-01-07 ENCOUNTER — Other Ambulatory Visit: Payer: Self-pay

## 2024-01-07 VITALS — BP 104/74 | HR 83 | Wt >= 6400 oz

## 2024-01-07 DIAGNOSIS — K219 Gastro-esophageal reflux disease without esophagitis: Secondary | ICD-10-CM

## 2024-01-07 DIAGNOSIS — N920 Excessive and frequent menstruation with regular cycle: Secondary | ICD-10-CM

## 2024-01-07 DIAGNOSIS — R110 Nausea: Secondary | ICD-10-CM

## 2024-01-07 DIAGNOSIS — R109 Unspecified abdominal pain: Secondary | ICD-10-CM | POA: Diagnosis not present

## 2024-01-07 DIAGNOSIS — D509 Iron deficiency anemia, unspecified: Secondary | ICD-10-CM | POA: Diagnosis not present

## 2024-01-07 LAB — POCT URINALYSIS DIP (CLINITEK)
Bilirubin, UA: NEGATIVE
Blood, UA: NEGATIVE
Glucose, UA: NEGATIVE mg/dL
Ketones, POC UA: NEGATIVE mg/dL
Leukocytes, UA: NEGATIVE
Nitrite, UA: NEGATIVE
POC PROTEIN,UA: NEGATIVE
Spec Grav, UA: 1.025 (ref 1.010–1.025)
Urobilinogen, UA: 1 U/dL
pH, UA: 7 (ref 5.0–8.0)

## 2024-01-07 LAB — POCT URINE PREGNANCY: Preg Test, Ur: NEGATIVE

## 2024-01-07 MED ORDER — ONDANSETRON HCL 4 MG PO TABS
4.0000 mg | ORAL_TABLET | Freq: Three times a day (TID) | ORAL | 0 refills | Status: DC | PRN
  Filled 2024-01-07 (×2): qty 20, 7d supply, fill #0

## 2024-01-07 MED ORDER — OMEPRAZOLE 40 MG PO CPDR
40.0000 mg | DELAYED_RELEASE_CAPSULE | Freq: Every day | ORAL | 1 refills | Status: DC
  Filled 2024-01-07 (×2): qty 90, 90d supply, fill #0
  Filled 2024-08-12 – 2024-08-25 (×2): qty 90, 90d supply, fill #1

## 2024-01-07 NOTE — Patient Instructions (Signed)
Drink 64 to 80 ounces water daily.  I am referring you for Iron infusion.  Continue your oral iron tablets

## 2024-01-07 NOTE — Progress Notes (Unsigned)
Patient ID: [name], female   DOB: 06-Mar-1991, 33 y.o.   MRN: [id number]   Luise Yowell, is a 33 y.o. female  [id number]:[id number]  [id number]  DOB - 24-Sep-1991  Chief Complaint  Patient presents with   Abdominal Pain       Subjective:   [name] is a 33 y.o. female here today for Stomach pains that she has been having now for about 1 month.  She was started on Wegovy about 1 month ago.  She has been on the 0.25mg  and has an appt tomorrow for recheck and titration.    Some nausea, no vomiting.  Some loose BMs.  No fever.  No urinary or vaginal s/sx.  She is wondering if she might need a transfusion bc of "low Iron levels" and anemia.  B12 and folate are normal.  Tsh has been normal.   Denies SI/HI today.  Has ongoing depression but says it is stable for her. Seen by psych No problems updated.  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 Authorizing Provider  amphetamine-dextroamphetamine (ADDERALL) 20 MG tablet Take 1 tablet (20 mg total) by mouth daily. 12/16/23  Yes Toy Cookey E, NP  buPROPion (WELLBUTRIN XL) 300 MG 24 hr tablet Take 1 tablet (300 mg total) by mouth every morning. 12/16/23  Yes Toy Cookey E, NP  cetirizine  (ZYRTEC) 10 MG tablet Take 1 tablet (10 mg total) by mouth daily. 02/18/23  Yes Hoy Register, MD  cyclobenzaprine (FLEXERIL) 10 MG tablet Take 1 tablet (10 mg total) by mouth at bedtime as needed for muscle spasms. 05/28/23  Yes Hoy Register, MD  famotidine (PEPCID) 20 MG tablet Take 1 tablet (20 mg total) by mouth daily. 04/30/23  Yes Ernie Avena, MD  gabapentin (NEURONTIN) 300 MG capsule Take 1 capsule (300 mg total) by mouth 3 (three) times daily. 08/07/22  Yes Hoy Register, MD  hydrOXYzine (ATARAX) 10 MG tablet Take 1 tablet (10 mg total) by mouth 3 (three) times daily as needed. 12/16/23  Yes Toy Cookey E, NP  Iron, Ferrous Sulfate, 325 (65 Fe) MG TABS Take 1 tablet by mouth daily. 11/25/23  Yes Hoy Register, MD  meloxicam (MOBIC) 7.5 MG tablet Take 1 tablet (7.5 mg total) by mouth daily. 05/28/23  Yes Hoy Register, MD  omeprazole (PRILOSEC) 40 MG capsule Take 1 capsule (40 mg total) by mouth daily. 01/07/24  Yes Nekoda Chock, Marzella Schlein, PA-C  ondansetron (ZOFRAN) 4 MG tablet Take 1 tablet (4 mg total) by mouth every 8 (eight) hours as  needed for nausea or vomiting. 01/07/24  Yes Kilea Mccarey, Marzella Schlein, PA-C  Semaglutide-Weight Management (WEGOVY) 0.25 MG/0.5ML SOAJ Inject 0.25 mg into the skin once a week. 12/03/23  Yes Danford, Orpha Bur D, NP  topiramate (TOPAMAX) 50 MG tablet Take 1 tablet (50 mg total) by mouth 2 (two) times daily. 08/01/22  Yes Anders Simmonds, PA-C  tranexamic acid (LYSTEDA) 650 MG TABS tablet Take 2 tablets (1,300 mg total) by mouth 3 (three) times daily. Take during menses for a maximum of five days 09/25/22  Yes Lorriane Shire, MD  Vitamin D, Ergocalciferol, (DRISDOL) 1.25 MG (50000 UNIT) CAPS capsule Take 1 capsule (50,000 Units total) by mouth 2 (two) times a week. 12/04/23  Yes Danford, Katy D, NP    ROS: Neg HEENT Neg resp Neg cardiac Neg MS Neg psych Neg neuro  Objective:   Vitals:   01/07/24 1625 01/07/24 1627  BP:  104/74  Pulse:  83  SpO2:   97%  Weight: (!) 420 lb 6.4 oz (190.7 kg)    Exam General appearance : Awake, alert, not in any distress. Flat affect.  Minimal eye contact.  Speech Clear. Not toxic looking HEENT: Atraumatic and Normocephalic Neck: Supple, no JVD. No cervical lymphadenopathy.  Chest: Good air entry bilaterally, CTAB.  No rales/rhonchi/wheezing CVS: S1 S2 regular, no murmurs.  Abdomen: Bowel sounds present, Non tender and not distended with no gaurding, rigidity or rebound. Extremities: B/L Lower Ext shows no edema, both legs are warm to touch Neurology: Awake alert, and oriented X 3, CN II-XII intact, Non focal Skin: No Rash  Data Review Lab Results  Component Value Date   HGBA1C 5.9 (H) 07/17/2023   HGBA1C 5.6 08/01/2022   HGBA1C 5.6 01/17/2021    Assessment & Plan   1. Gastroesophageal reflux disease without esophagitis - omeprazole (PRILOSEC) 40 MG capsule; Take 1 capsule (40 mg total) by mouth daily.  Dispense: 90 capsule; Refill: 1 - Comprehensive metabolic panel  2. Stomach pain (Primary) Likely expected SE of wegovy-does not seem severe.  I have advised her this may continue as dosing goes up and she can discuss benefit vs side effects with provider tomorrow.   - POCT urine pregnancy - POCT URINALYSIS DIP (CLINITEK) - Comprehensive metabolic panel  3. Nausea - Comprehensive metabolic panel - ondansetron (ZOFRAN) 4 MG tablet; Take 1 tablet (4 mg total) by mouth every 8 (eight) hours as needed for nausea or vomiting.  Dispense: 20 tablet; Refill: 0  4. Microcytic anemia Continue oral iron - CBC with Differential - Iron, TIBC and Ferritin Panel - Ambulatory referral to Hematology / Oncology  5. Menorrhagia with regular cycle - CBC with Differential - Iron, TIBC and Ferritin Panel    Return in about 3 months (around 04/06/2024) for PCP for chronic conditions-[place].  The patient was given clear instructions to go to ER or return to medical center if symptoms don't improve,  worsen or new problems develop. The patient verbalized understanding. The patient was told to call to get lab results if they haven't heard anything in the next week.      Georgian Co, PA-C [facility] and [facility] [place], [place] [phone]   01/07/2024, 4:59 PM

## 2024-01-07 NOTE — Progress Notes (Unsigned)
   SUBJECTIVE:  Chief Complaint: Obesity  Interim History: ***  [name] is here to discuss her progress with her obesity treatment plan. She is on the {HWW Weight Loss Plan:210964005} and states she {CHL AMB IS/IS NOT:210130109} following her eating plan approximately *** % of the time. She states she {CHL AMB IS/IS NOT:210130109} exercising *** minutes *** times per week.   OBJECTIVE: Visit Diagnoses: Problem List Items Addressed This Visit     Morbid obesity with starting BMI 76.7   Polyphagia   Prediabetes - Primary   Vitamin D deficiency (Chronic)    Vitals BP: 104/74 Pulse Rate: 83 SpO2: 97 %   Anthropometric Measurements Weight: (!) 420 lb 6.4 oz (190.7 kg)   No data recorded No data recorded   ASSESSMENT AND PLAN:  Diet: Kellin {CHL AMB IS/IS NOT:210130109} currently in the action stage of change. As such, her goal is to {HWW Weight Loss Efforts:210964006}. She {HAS HAS NFA:21308} agreed to {HWW Weight Loss Plan:210964005}.  Exercise: Kabao has been instructed {HWW Exercise:210964007} for weight loss and overall health benefits.   Behavior Modification:  We discussed the following Behavioral Modification Strategies today: {HWW Behavior Modification:210964008}. We discussed various medication options to help Harika with her weight loss efforts and we both agreed to ***.  No follow-ups on file.Marland Kitchen She was informed of the importance of frequent follow up visits to maximize her success with intensive lifestyle modifications for her multiple health conditions.  Attestation Statements:   Reviewed by clinician on day of visit: allergies, medications, problem list, medical history, surgical history, family history, social history, and previous encounter notes.   Time spent on visit including pre-visit chart review and post-visit care and charting was *** minutes.    Kalah Pflum, PA-C

## 2024-01-08 ENCOUNTER — Other Ambulatory Visit: Payer: Self-pay

## 2024-01-08 ENCOUNTER — Ambulatory Visit (INDEPENDENT_AMBULATORY_CARE_PROVIDER_SITE_OTHER): Payer: Commercial Managed Care - HMO | Admitting: Physician Assistant

## 2024-01-08 DIAGNOSIS — E559 Vitamin D deficiency, unspecified: Secondary | ICD-10-CM

## 2024-01-08 DIAGNOSIS — R632 Polyphagia: Secondary | ICD-10-CM

## 2024-01-08 DIAGNOSIS — R7303 Prediabetes: Secondary | ICD-10-CM

## 2024-01-13 ENCOUNTER — Ambulatory Visit (INDEPENDENT_AMBULATORY_CARE_PROVIDER_SITE_OTHER): Payer: Commercial Managed Care - HMO | Admitting: Internal Medicine

## 2024-01-21 ENCOUNTER — Other Ambulatory Visit: Payer: Self-pay | Admitting: *Deleted

## 2024-01-21 DIAGNOSIS — D500 Iron deficiency anemia secondary to blood loss (chronic): Secondary | ICD-10-CM

## 2024-01-22 ENCOUNTER — Inpatient Hospital Stay: Payer: Commercial Managed Care - HMO | Admitting: Internal Medicine

## 2024-01-22 ENCOUNTER — Inpatient Hospital Stay: Payer: Commercial Managed Care - HMO | Attending: Internal Medicine

## 2024-01-26 ENCOUNTER — Telehealth (INDEPENDENT_AMBULATORY_CARE_PROVIDER_SITE_OTHER): Payer: Commercial Managed Care - HMO | Admitting: Psychology

## 2024-01-26 ENCOUNTER — Telehealth (INDEPENDENT_AMBULATORY_CARE_PROVIDER_SITE_OTHER): Payer: Self-pay | Admitting: Psychology

## 2024-01-26 NOTE — Telephone Encounter (Signed)
  Office: [phone]  /  Fax: [phone]  Date of Call: January 26, 2024  Time of Call: 2:33pm Provider: Catherene Close, PsyD  CONTENT: This provider called [facility] to check-in as she did not present for today's MyChart Video Visit appointment. A HIPAA compliant voicemail requesting a call back could not be left as her mailbox was full. Of note, this provider stayed on the MyChart Video Visit appointment for 5 minutes prior to signing off per the clinic's grace period policy.    PLAN: This provider will wait for Tynika to call back. No further follow-up planned by this provider.

## 2024-01-26 NOTE — Progress Notes (Unsigned)
  Office: [phone]  /  Fax: [phone]    Date: January 26, 2024  Appointment Start Time: *** Duration: *** minutes Provider: Catherene Close, Psy.D. Type of Session: Individual Therapy  Location of Patient: {gbptloc:23249} (private location) Location of Provider: Provider's Home (private office) Type of Contact: Telepsychological Visit via MyChart Video Visit  Session Content: This provider called [facility] at 2:33pm as she did not present for today's appointment. A HIPAA compliant voicemail requesting a call back could not be left as her mailbox was full. She was observed joining shortly after. As such, today's appointment was initiated *** minutes late. [name] is a 33 y.o. female presenting for a follow-up appointment to address the previously established treatment goal of increasing coping skills.Today's appointment was a telepsychological visit. [name] provided verbal consent for today's telepsychological appointment and she is aware she is responsible for securing confidentiality on her end of the session. Prior to proceeding with today's appointment, [name]'s physical location at the time of this appointment was obtained as well a phone number she could be reached at in the event of technical difficulties. [name] and this provider participated in today's telepsychological service.   This provider conducted a brief check-in. *** [name] was receptive to today's appointment as evidenced by openness to sharing, responsiveness to feedback, and {gbreceptiveness:23401}.  Mental Status Examination:  Appearance: {Appearance:22431} Behavior: {Behavior:22445} Mood: {gbmood:21757} Affect: {Affect:22436} Speech: {Speech:22432} Eye Contact: {Eye Contact:22433} Psychomotor Activity: {Motor Activity:22434} Gait: {gbgait:23404} Thought Process: {thought process:22448}  Thought Content/Perception: {disturbances:22451} Orientation: {Orientation:22437} Memory/Concentration:  {gbcognition:22449} Insight: {Insight:22446} Judgment: {Insight:22446}  Interventions:  {Interventions for Progress Notes:23405}  DSM-5 Diagnosis(es):  F50.89 Other Specified Feeding or Eating Disorder, Emotional Eating Behaviors, F41.1 Generalized Anxiety Disorder, F33.0 Major Depressive Disorder, Recurrent Episode, Mild, and F90.9 Unspecified Attention-Deficit/Hyperactivity Disorder   Treatment Goal & Progress: During the initial appointment with this provider, the following treatment goal was established: increase coping skills. [name] has demonstrated progress in her goal as evidenced by {gbtxprogress:22839}. [name] also {gbtxprogress2:22951}.  Plan: The next appointment is scheduled for *** at ***, which will be via MyChart Video Visit. The next session will focus on {Plan for Next Appointment:23400}.   Catherene Close, PsyD

## 2024-01-27 ENCOUNTER — Other Ambulatory Visit: Payer: Self-pay

## 2024-02-18 ENCOUNTER — Telehealth (HOSPITAL_COMMUNITY): Payer: Commercial Managed Care - HMO | Admitting: Psychiatry

## 2024-02-18 ENCOUNTER — Other Ambulatory Visit: Payer: Self-pay

## 2024-02-18 ENCOUNTER — Encounter (HOSPITAL_COMMUNITY): Payer: Self-pay | Admitting: Psychiatry

## 2024-02-18 DIAGNOSIS — F411 Generalized anxiety disorder: Secondary | ICD-10-CM | POA: Diagnosis not present

## 2024-02-18 DIAGNOSIS — F330 Major depressive disorder, recurrent, mild: Secondary | ICD-10-CM

## 2024-02-18 DIAGNOSIS — F900 Attention-deficit hyperactivity disorder, predominantly inattentive type: Secondary | ICD-10-CM

## 2024-02-18 MED ORDER — BUPROPION HCL ER (XL) 300 MG PO TB24
300.0000 mg | ORAL_TABLET | ORAL | 3 refills | Status: DC
  Filled 2024-02-18: qty 30, 30d supply, fill #0

## 2024-02-18 MED ORDER — HYDROXYZINE HCL 10 MG PO TABS
10.0000 mg | ORAL_TABLET | Freq: Three times a day (TID) | ORAL | 3 refills | Status: DC | PRN
  Filled 2024-02-18: qty 90, 30d supply, fill #0
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
## 2024-03-11 ENCOUNTER — Other Ambulatory Visit: Payer: Self-pay

## 2024-03-12 ENCOUNTER — Other Ambulatory Visit: Payer: Self-pay

## 2024-03-12 DIAGNOSIS — D500 Iron deficiency anemia secondary to blood loss (chronic): Secondary | ICD-10-CM

## 2024-03-15 ENCOUNTER — Inpatient Hospital Stay: Payer: Commercial Managed Care - HMO | Admitting: Internal Medicine

## 2024-03-15 ENCOUNTER — Inpatient Hospital Stay: Payer: Commercial Managed Care - HMO | Attending: Internal Medicine

## 2024-05-05 ENCOUNTER — Other Ambulatory Visit (HOSPITAL_COMMUNITY): Payer: Self-pay

## 2024-05-05 ENCOUNTER — Other Ambulatory Visit: Payer: Self-pay

## 2024-05-05 ENCOUNTER — Encounter (HOSPITAL_COMMUNITY): Payer: Self-pay | Admitting: Psychiatry

## 2024-05-05 ENCOUNTER — Telehealth (INDEPENDENT_AMBULATORY_CARE_PROVIDER_SITE_OTHER): Admitting: Psychiatry

## 2024-05-05 DIAGNOSIS — F411 Generalized anxiety disorder: Secondary | ICD-10-CM | POA: Diagnosis not present

## 2024-05-05 DIAGNOSIS — F900 Attention-deficit hyperactivity disorder, predominantly inattentive type: Secondary | ICD-10-CM | POA: Diagnosis not present

## 2024-05-05 DIAGNOSIS — F321 Major depressive disorder, single episode, moderate: Secondary | ICD-10-CM

## 2024-05-05 MED ORDER — BUPROPION HCL ER (XL) 300 MG PO TB24
300.0000 mg | ORAL_TABLET | ORAL | 3 refills | Status: DC
Start: 2024-05-05 — End: 2024-09-30
  Filled 2024-05-05 (×2): qty 30, 30d supply, fill #0

## 2024-05-05 MED ORDER — ATOMOXETINE HCL 40 MG PO CAPS
40.0000 mg | ORAL_CAPSULE | Freq: Every day | ORAL | 3 refills | Status: DC
Start: 2024-05-05 — End: 2024-09-30
  Filled 2024-05-05 (×2): qty 30, 30d supply, fill #0

## 2024-05-05 MED ORDER — GABAPENTIN 300 MG PO CAPS
300.0000 mg | ORAL_CAPSULE | Freq: Three times a day (TID) | ORAL | 2 refills | Status: DC
  Filled 2024-05-05 (×2): qty 90, 30d supply, fill #0

## 2024-05-05 MED ORDER — HYDROXYZINE HCL 10 MG PO TABS
10.0000 mg | ORAL_TABLET | Freq: Three times a day (TID) | ORAL | 3 refills | Status: DC | PRN
  Filled 2024-05-05 (×2): qty 90, 30d supply, fill #0
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
## 2024-05-06 ENCOUNTER — Other Ambulatory Visit: Payer: Self-pay

## 2024-06-04 ENCOUNTER — Telehealth: Admitting: Physician Assistant

## 2024-06-04 ENCOUNTER — Encounter: Payer: Self-pay | Admitting: Internal Medicine

## 2024-06-04 ENCOUNTER — Ambulatory Visit: Payer: Self-pay

## 2024-06-04 DIAGNOSIS — K047 Periapical abscess without sinus: Secondary | ICD-10-CM

## 2024-06-04 MED ORDER — AMOXICILLIN-POT CLAVULANATE 875-125 MG PO TABS: 1.0000 | ORAL_TABLET | Freq: Two times a day (BID) | ORAL | 0 refills | Status: DC

## 2024-06-04 NOTE — Patient Instructions (Signed)
 [name], thank you for joining [name], PA-C for today's virtual visit.  While this provider is not your primary care provider (PCP), if your PCP is located in our provider database this encounter information will be shared with them immediately following your visit.   A [facility] MyChart account gives you access to today's visit and all your visits, tests, and labs performed at [facility]  click here if you don't have a [facility] MyChart account or go to [email or web address]  Consent: (Patient) [name] provided verbal consent for this virtual visit at the beginning of the encounter.  Current Medications:  Current Outpatient Medications:    amoxicillin -clavulanate (AUGMENTIN ) 875-125 MG tablet, Take 1 tablet by mouth 2 (two) times daily., Disp: 14 tablet, Rfl: 0   amphetamine -dextroamphetamine  (ADDERALL) 20 MG tablet, Take 1 tablet (20 mg total) by mouth daily., Disp: 30 tablet, Rfl: 0   atomoxetine  (STRATTERA ) 40 MG capsule, Take 1 capsule (40 mg total) by mouth daily., Disp: 30 capsule, Rfl: 3   buPROPion  (WELLBUTRIN  XL) 300 MG 24 hr tablet, Take 1 tablet (300 mg total) by mouth every morning., Disp: 30 tablet, Rfl: 3   cetirizine  (ZYRTEC ) 10 MG tablet, Take 1 tablet (10 mg total) by mouth daily., Disp: 30 tablet, Rfl: 2   cyclobenzaprine  (FLEXERIL ) 10 MG tablet, Take 1 tablet (10 mg total) by mouth at bedtime as needed for muscle spasms., Disp: 30 tablet, Rfl: 1   famotidine  (PEPCID ) 20 MG tablet, Take 1 tablet (20 mg total) by mouth daily., Disp: 30 tablet, Rfl: 0   hydrOXYzine  (ATARAX ) 10 MG tablet, Take 1 tablet (10 mg total) by mouth 3 (three) times daily as needed., Disp: 90 tablet, Rfl: 3   Iron , Ferrous Sulfate , 325 (65 Fe) MG TABS, Take 1 tablet by mouth daily., Disp: 60 tablet, Rfl: 3   meloxicam  (MOBIC ) 7.5 MG tablet, Take 1 tablet (7.5 mg total) by mouth daily., Disp: 30 tablet, Rfl: 1   omeprazole  (PRILOSEC) 40 MG  capsule, Take 1 capsule (40 mg total) by mouth daily., Disp: 90 capsule, Rfl: 1   ondansetron  (ZOFRAN ) 4 MG tablet, Take 1 tablet (4 mg total) by mouth every 8 (eight) hours as needed for nausea or vomiting., Disp: 20 tablet, Rfl: 0   Semaglutide -Weight Management (WEGOVY ) 0.25 MG/0.5ML SOAJ, Inject 0.25 mg into the skin once a week., Disp: 2 mL, Rfl: 0   topiramate  (TOPAMAX ) 50 MG tablet, Take 1 tablet (50 mg total) by mouth 2 (two) times daily., Disp: 60 tablet, Rfl: 3   tranexamic acid  (LYSTEDA ) 650 MG TABS tablet, Take 2 tablets (1,300 mg total) by mouth 3 (three) times daily. Take during menses for a maximum of five days, Disp: 60 tablet, Rfl: 2   Vitamin D , Ergocalciferol , (DRISDOL ) 1.25 MG (50000 UNIT) CAPS capsule, Take 1 capsule (50,000 Units total) by mouth 2 (two) times a week., Disp: 10 capsule, Rfl: 0   Medications ordered in this encounter:  Meds ordered this encounter  Medications   amoxicillin -clavulanate (AUGMENTIN ) 875-125 MG tablet    Sig: Take 1 tablet by mouth 2 (two) times daily.    Dispense:  14 tablet    Refill:  0    Supervising Provider:   Corine Dice [[id number]]     *If you need refills on other medications prior to your next appointment, please contact your pharmacy*  Follow-Up: Call back or seek an in-person evaluation if the symptoms worsen or if the condition fails to improve  as anticipated.   Virtual Care [phone]  Other Instructions Dental Abscess  A dental abscess is an infection around a tooth that may involve pain, swelling, and a collection of pus, as well as other symptoms. Treatment is important to help with symptoms and to prevent the infection from spreading. The general types of dental abscesses are: Pulpal abscess. This abscess may form from the inner part of the tooth (pulp). Periodontal abscess. This abscess may form from the gum. What are the causes? This condition is caused by a bacterial infection in or around the  tooth. It may result from: Severe tooth decay (cavities). Trauma to the tooth, such as a broken or chipped tooth. What increases the risk? This condition is more likely to develop in males. It is also more likely to develop in people who: Have cavities. Have severe gum disease. Eat sugary snacks between meals. Use tobacco products. Have diabetes. Have a weakened disease-fighting system (immune system). Do not brush and care for their teeth regularly. What are the signs or symptoms? Mild symptoms of this condition include: Tenderness. Bad breath. Fever. A bitter taste in the mouth. Pain in and around the infected tooth. Moderate symptoms of this condition include: Swollen neck glands. Chills. Pus drainage. Swelling and redness around the infected tooth, in the mouth, or in the face. Severe pain in and around the infected tooth. Severe symptoms of this condition include: Difficulty swallowing. Difficulty opening the mouth. Nausea. Vomiting. How is this diagnosed? This condition is diagnosed based on: Your symptoms and your medical and dental history. An examination of the infected tooth. During the exam, your dental care provider may tap on the infected tooth. You may also need to have X-rays taken of the affected area. How is this treated? This condition is treated by getting rid of the infection. This may be done with: Antibiotic medicines. These may be used in certain situations. Antibacterial mouth rinse. Incision and drainage. This procedure is done by making an incision in the abscess to drain out the pus. Removing pus is the first priority in treating an abscess. A root canal. This may be performed to save the tooth. Your dental care provider accesses the visible part of your tooth (crown) with a drill and removes any infected pulp. Then the space is filled and sealed off. Tooth extraction. The tooth is pulled out if it cannot be saved by other treatment. You may also  receive treatment for pain, such as: Acetaminophen  or NSAIDs. Gels that contain a numbing medicine. An injection to block the pain near your nerve. Follow these instructions at home: Medicines Take over-the-counter and prescription medicines only as told by your dental care provider. If you were prescribed an antibiotic, take it as told by your dental care provider. Do not stop taking the antibiotic even if you start to feel better. If you were prescribed a gel that contains a numbing medicine, use it exactly as told in the directions. Do not use these gels for children who are younger than 58 years of age. Use an antibacterial mouth rinse as told by your dental care provider. General instructions  Gargle with a mixture of salt and water 3-4 times a day or as needed. To make salt water, completely dissolve -1 tsp (3-6 g) of salt in 1 cup (237 mL) of warm water. Eat a soft diet while your abscess is healing. Drink enough fluid to keep your urine pale yellow. Do not apply heat to the outside of  your mouth. Do not use any products that contain nicotine or tobacco. These products include cigarettes, chewing tobacco, and vaping devices, such as e-cigarettes. If you need help quitting, ask your dental care provider. Keep all follow-up visits. This is important. How is this prevented?  Excellent dental home care, which includes brushing your teeth every morning and night with fluoride toothpaste. Floss one time each day. Get regularly scheduled dental cleanings. Consider having a dental sealant applied on teeth that have deep grooves to prevent cavities. Drink fluoridated water regularly. This includes most tap water. Check the label on bottled water to see if it contains fluoride. Reduce or eliminate sugary drinks. Eat healthy meals and snacks. Wear a mouth guard or face shield to protect your teeth while playing sports. Contact a health care provider if: Your pain is worse and is not helped by  medicine. You have swelling. You see pus around the tooth. You have a fever or chills. Get help right away if: Your symptoms suddenly get worse. You have a very bad headache. You have problems breathing or swallowing. You have trouble opening your mouth. You have swelling in your neck or around your eye. These symptoms may represent a serious problem that is an emergency. Do not wait to see if the symptoms will go away. Get medical help right away. Call your local emergency services (911 in the [place] Do not drive yourself to the hospital. Summary A dental abscess is a collection of pus in or around a tooth that results from an infection. A dental abscess may result from severe tooth decay, trauma to the tooth, or severe gum disease around a tooth. Symptoms include severe pain, swelling, redness, and drainage of pus in and around the infected tooth. The first priority in treating a dental abscess is to drain out the pus. Treatment may also involve removing damage inside the tooth (root canal) or extracting the tooth. This information is not intended to replace advice given to you by your health care provider. Make sure you discuss any questions you have with your health care provider. Document Revised: 02/08/2021 Document Reviewed: 02/08/2021 Elsevier Patient Education  2024 Elsevier Inc.   If you have been instructed to have an in-person evaluation today at a local Urgent Care facility, please use the link below. It will take you to a list of all of our available [facility], including address, phone number and hours of operation. Please do not delay care.  [facility]  If you or a family member do not have a primary care provider, use the link below to schedule a visit and establish care. When you choose a [facility] primary care physician or advanced practice provider, you gain a long-term partner in health. Find a Primary Care Provider  Learn more about [facility]'s in-office and virtual care options:  - Get Care Now

## 2024-06-04 NOTE — Telephone Encounter (Signed)
 Copied from CRM [id number]. Topic: Clinical - Red Word Triage >> Jun 04, 2024  4:21 PM Donald Frost wrote: Red Word that prompted transfer to Nurse Triage: The patient called in stating she has been in pain for 5 days with a bad tooth infection and is in a lot of pain. I will transfer her to E2C2 NT   Reason for Disposition  [1] SEVERE pain (e.g., excruciating, unable to eat, unable to do any normal activities) AND [2] not improved 2 hours after pain medicine  Answer Assessment - Initial Assessment Questions 1. LOCATION: Which tooth is hurting?  (e.g., right-side/left-side, upper/lower, front/back)     Lower left side  2. ONSET: When did the toothache start?  (e.g., hours, days)      5 days  3. SEVERITY: How bad is the toothache?  (Scale 1-10; mild, moderate or severe)   - MILD (1-3): doesn't interfere with chewing    - MODERATE (4-7): interferes with chewing, interferes with normal activities, awakens from sleep     - SEVERE (8-10): unable to eat, unable to do any normal activities, excruciating pain        10/10 4. SWELLING: Is there any visible swelling of your face?     Yes 5. OTHER SYMPTOMS: Do you have any other symptoms? (e.g., fever)     No 6. PREGNANCY: Is there any chance you are pregnant? When was your last menstrual period?     No  Protocols used: Toothache-A-AH   FYI Only or Action Required?: FYI only for provider.  Patient was last seen in primary care on 01/07/2024 by Hassie Lint, PA-C. Called Nurse Triage reporting Dental Pain. Symptoms began several days ago. Interventions attempted: OTC medications: Tylenol . Symptoms are: Worsening.  Triage Disposition: See HCP Within 4 Hours (Or PCP Triage)  Patient/caregiver understands and will follow disposition?: Yes

## 2024-06-04 NOTE — Progress Notes (Signed)
 Virtual Visit Consent   [name], you are scheduled for a virtual visit with a [place] provider today. Just as with appointments in the office, your consent must be obtained to participate. Your consent will be active for this visit and any virtual visit you may have with one of our providers in the next 365 days. If you have a MyChart account, a copy of this consent can be sent to you electronically.  As this is a virtual visit, video technology does not allow for your provider to perform a traditional examination. This may limit your provider's ability to fully assess your condition. If your provider identifies any concerns that need to be evaluated in person or the need to arrange testing (such as labs, EKG, etc.), we will make arrangements to do so. Although advances in technology are sophisticated, we cannot ensure that it will always work on either your end or our end. If the connection with a video visit is poor, the visit may have to be switched to a telephone visit. With either a video or telephone visit, we are not always able to ensure that we have a secure connection.  By engaging in this virtual visit, you consent to the provision of healthcare and authorize for your insurance to be billed (if applicable) for the services provided during this visit. Depending on your insurance coverage, you may receive a charge related to this service.  I need to obtain your verbal consent now. Are you willing to proceed with your visit today? [name] has provided verbal consent on 06/04/2024 for a virtual visit (video or telephone). [name] Kelp, PA-C  Date: 06/04/2024 5:23 PM   Virtual Visit via Video Note   I, [name], connected with  [name]  ([id number], June 29, 1991) on 06/04/24 at  5:15 PM EDT by a video-enabled telemedicine application and verified that I am speaking with the correct person using two identifiers.  Location: Patient:  Virtual Visit Location Patient: Home Provider: Virtual Visit Location Provider: Home Office   I discussed the limitations of evaluation and management by telemedicine and the availability of in person appointments. The patient expressed understanding and agreed to proceed.    History of Present Illness: [name] is a 33 y.o. who identifies as a female who was assigned female at birth, and is being seen today for dental infection.  HPI: Dental Pain  This is a new problem. The current episode started in the past 7 days (5 days). The problem occurs constantly. The problem has been gradually worsening. The pain is severe. Associated symptoms include facial pain (left; ear pain also). Pertinent negatives include no fever, oral bleeding, sinus pressure or thermal sensitivity. She has tried acetaminophen  for the symptoms. The treatment provided no relief.     Problems:  Patient Active Problem List   Diagnosis Date Noted   Polyphagia 07/31/2023   Prediabetes 07/31/2023   Other fatigue 07/17/2023   SOBOE (shortness of breath on exertion) 07/17/2023   Anxiety and depression 07/17/2023   DUB (dysfunctional uterine bleeding) 07/17/2023   BMI 70 and over, adult (HCC) 07/17/2023   Depression screen 07/17/2023   Morbid obesity with starting BMI 76.7 07/17/2023   Unilateral primary osteoarthritis, left knee 02/11/2023   Dental abscess 02/21/2022   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 prior episode (HCC) 10/31/2021   OSA (obstructive sleep apnea) 10/15/2021   Depression, major, recurrent, mild (HCC) 04/30/2021   Generalized  anxiety disorder 04/30/2021   Migraine 06/02/2017   Intercostal pain 08/25/2016   Knee pain, bilateral 07/14/2016   Venous stasis dermatitis of both lower extremities 05/26/2016   Stye external 03/25/2016   Plantar fasciitis, right 03/25/2016   Leg swelling 11/17/2015   Headache 09/08/2015   Pain of molar  05/26/2015   Sebaceous cyst of left axilla 04/13/2015   Vitamin D  deficiency 03/29/2015   Allergic rhinitis 04/11/2010   Iron  deficiency anemia 03/09/2010   Esophageal reflux 03/24/2009   MENORRHAGIA 03/17/2009   Morbid obesity with body mass index of 70 and over in adult [facility]) 05/15/2007   Depression 02/12/2007   Attention deficit hyperactivity disorder (ADHD), predominantly inattentive type 02/12/2007   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    Allergies:  Allergies  Allergen Reactions   Banana Itching   Food Itching    All fruits   Medications:  Current Outpatient Medications:    amoxicillin -clavulanate (AUGMENTIN ) 875-125 MG tablet, Take 1 tablet by mouth 2 (two) times daily., Disp: 14 tablet, Rfl: 0   amphetamine -dextroamphetamine  (ADDERALL) 20 MG tablet, Take 1 tablet (20 mg total) by mouth daily., Disp: 30 tablet, Rfl: 0   atomoxetine  (STRATTERA ) 40 MG capsule, Take 1 capsule (40 mg total) by mouth daily., Disp: 30 capsule, Rfl: 3   buPROPion  (WELLBUTRIN  XL) 300 MG 24 hr tablet, Take 1 tablet (300 mg total) by mouth every morning., Disp: 30 tablet, Rfl: 3   cetirizine  (ZYRTEC ) 10 MG tablet, Take 1 tablet (10 mg total) by mouth daily., Disp: 30 tablet, Rfl: 2   cyclobenzaprine  (FLEXERIL ) 10 MG tablet, Take 1 tablet (10 mg total) by mouth at bedtime as needed for muscle spasms., Disp: 30 tablet, Rfl: 1   famotidine  (PEPCID ) 20 MG tablet, Take 1 tablet (20 mg total) by mouth daily., Disp: 30 tablet, Rfl: 0   hydrOXYzine  (ATARAX ) 10 MG tablet, Take 1 tablet (10 mg total) by mouth 3 (three) times daily as needed., Disp: 90 tablet, Rfl: 3   Iron , Ferrous Sulfate , 325 (65 Fe) MG TABS, Take 1 tablet by mouth daily., Disp: 60 tablet, Rfl: 3   meloxicam  (MOBIC ) 7.5 MG tablet, Take 1 tablet (7.5 mg total) by mouth daily., Disp: 30 tablet, Rfl: 1   omeprazole  (PRILOSEC) 40 MG capsule, Take 1 capsule (40 mg total) by mouth daily., Disp: 90 capsule, Rfl: 1   ondansetron  (ZOFRAN ) 4 MG  tablet, Take 1 tablet (4 mg total) by mouth every 8 (eight) hours as needed for nausea or vomiting., Disp: 20 tablet, Rfl: 0   Semaglutide -Weight Management (WEGOVY ) 0.25 MG/0.5ML SOAJ, Inject 0.25 mg into the skin once a week., Disp: 2 mL, Rfl: 0   topiramate  (TOPAMAX ) 50 MG tablet, Take 1 tablet (50 mg total) by mouth 2 (two) times daily., Disp: 60 tablet, Rfl: 3   tranexamic acid  (LYSTEDA ) 650 MG TABS tablet, Take 2 tablets (1,300 mg total) by mouth 3 (three) times daily. Take during menses for a maximum of five days, Disp: 60 tablet, Rfl: 2   Vitamin D , Ergocalciferol , (DRISDOL ) 1.25 MG (50000 UNIT) CAPS capsule, Take 1 capsule (50,000 Units total) by mouth 2 (two) times a week., Disp: 10 capsule, Rfl: 0  Observations/Objective: Patient is well-developed, well-nourished in no acute distress.  Resting comfortably at home.  Head is normocephalic, atraumatic.  No labored breathing.  Speech is clear and coherent with logical content.  Patient is alert and oriented at baseline.    Assessment and Plan: 1. Dental infection (Primary) - amoxicillin -clavulanate (AUGMENTIN ) 875-125 MG  tablet; Take 1 tablet by mouth 2 (two) times daily.  Dispense: 14 tablet; Refill: 0  - Suspected infection - Augmentin  prescribed - Can use ice on outside jaw/cheek for swelling - Can also take ibuprofen  and  tylenol  for pain with other medications - Discussed DenTemp putty that can be used to cover a broken tooth - Schedule a follow with a dentist as soon as possible (Can contact [facility] if underinsured or uninsured) - Seek in person evaluation if symptoms fail to improve or if they worsen   Follow Up Instructions: I discussed the assessment and treatment plan with the patient. The patient was provided an opportunity to ask questions and all were answered. The patient agreed with the plan and demonstrated an understanding of the instructions.  A copy of instructions were sent to the patient via  MyChart unless otherwise noted below.    The patient was advised to call back or seek an in-person evaluation if the symptoms worsen or if the condition fails to improve as anticipated.    [name] Kelp, PA-C

## 2024-06-08 ENCOUNTER — Other Ambulatory Visit: Payer: Self-pay

## 2024-06-08 ENCOUNTER — Encounter: Payer: Self-pay | Admitting: Physician Assistant

## 2024-06-08 ENCOUNTER — Encounter: Payer: Self-pay | Admitting: Internal Medicine

## 2024-06-08 DIAGNOSIS — K047 Periapical abscess without sinus: Secondary | ICD-10-CM

## 2024-06-08 MED ORDER — AMOXICILLIN-POT CLAVULANATE 875-125 MG PO TABS
1.0000 | ORAL_TABLET | Freq: Two times a day (BID) | ORAL | 0 refills | Status: DC
  Filled 2024-06-08: qty 14, 7d supply, fill #0

## 2024-07-08 ENCOUNTER — Encounter: Payer: Self-pay | Admitting: Internal Medicine

## 2024-07-12 ENCOUNTER — Encounter (HOSPITAL_COMMUNITY): Payer: Self-pay

## 2024-07-23 ENCOUNTER — Telehealth (HOSPITAL_COMMUNITY): Admitting: Physician Assistant

## 2024-07-27 ENCOUNTER — Telehealth (HOSPITAL_COMMUNITY): Admitting: Psychiatry

## 2024-07-27 ENCOUNTER — Encounter (HOSPITAL_COMMUNITY): Payer: Self-pay

## 2024-08-06 ENCOUNTER — Ambulatory Visit: Payer: Self-pay

## 2024-08-06 NOTE — Telephone Encounter (Signed)
 Call disconnected during triage interview. Placed call to patient, rec'd call could not be completed as dialed message. Will send to [facility] folder for f/u  Copied from CRM #[id number]. Topic: Clinical - Red Word Triage >> Aug 06, 2024  3:41 PM [name] wrote: Red Word that prompted transfer to Nurse Triage: Patient is losing circulation badly in arms and legs and feeling weak. Mentioned blood being low

## 2024-08-06 NOTE — Telephone Encounter (Signed)
 Second attempt: no answer after 20 rings, call placed in call backs  Copied from CRM #[id number]. Topic: Clinical - Red Word Triage >> Aug 06, 2024  3:41 PM Tysheama G wrote: Red Word that prompted transfer to Nurse Triage: Patient is losing circulation badly in arms and legs and feeling weak. Mentioned blood being low

## 2024-08-06 NOTE — Telephone Encounter (Signed)
 This RN attempted to contact patient. No answer after multiple rings. Will route to office for further follow up.

## 2024-08-12 ENCOUNTER — Other Ambulatory Visit: Payer: Self-pay

## 2024-08-12 ENCOUNTER — Encounter: Payer: Self-pay | Admitting: Internal Medicine

## 2024-08-14 ENCOUNTER — Encounter (HOSPITAL_COMMUNITY): Payer: Self-pay

## 2024-08-14 ENCOUNTER — Observation Stay (HOSPITAL_COMMUNITY)
Admission: EM | Admit: 2024-08-14 | Discharge: 2024-08-15 | Disposition: A | Discharge status: Home / Self Care | Attending: Internal Medicine | Admitting: Internal Medicine

## 2024-08-14 ENCOUNTER — Other Ambulatory Visit: Payer: Self-pay

## 2024-08-14 DIAGNOSIS — F10920 Alcohol use, unspecified with intoxication, uncomplicated: Secondary | ICD-10-CM | POA: Insufficient documentation

## 2024-08-14 DIAGNOSIS — Z6841 Body Mass Index (BMI) 40.0 and over, adult: Secondary | ICD-10-CM | POA: Insufficient documentation

## 2024-08-14 DIAGNOSIS — K219 Gastro-esophageal reflux disease without esophagitis: Secondary | ICD-10-CM | POA: Insufficient documentation

## 2024-08-14 DIAGNOSIS — F900 Attention-deficit hyperactivity disorder, predominantly inattentive type: Secondary | ICD-10-CM | POA: Diagnosis present

## 2024-08-14 DIAGNOSIS — F12920 Cannabis use, unspecified with intoxication, uncomplicated: Secondary | ICD-10-CM | POA: Insufficient documentation

## 2024-08-14 DIAGNOSIS — R531 Weakness: Secondary | ICD-10-CM | POA: Diagnosis present

## 2024-08-14 DIAGNOSIS — N920 Excessive and frequent menstruation with regular cycle: Secondary | ICD-10-CM | POA: Diagnosis present

## 2024-08-14 DIAGNOSIS — J45909 Unspecified asthma, uncomplicated: Secondary | ICD-10-CM | POA: Diagnosis not present

## 2024-08-14 DIAGNOSIS — G47419 Narcolepsy without cataplexy: Secondary | ICD-10-CM | POA: Diagnosis present

## 2024-08-14 DIAGNOSIS — D649 Anemia, unspecified: Principal | ICD-10-CM | POA: Diagnosis present

## 2024-08-14 DIAGNOSIS — D500 Iron deficiency anemia secondary to blood loss (chronic): Principal | ICD-10-CM | POA: Diagnosis present

## 2024-08-14 DIAGNOSIS — E669 Obesity, unspecified: Secondary | ICD-10-CM | POA: Diagnosis not present

## 2024-08-14 DIAGNOSIS — F32A Depression, unspecified: Secondary | ICD-10-CM | POA: Diagnosis not present

## 2024-08-14 DIAGNOSIS — D509 Iron deficiency anemia, unspecified: Principal | ICD-10-CM | POA: Diagnosis present

## 2024-08-14 DIAGNOSIS — G4733 Obstructive sleep apnea (adult) (pediatric): Secondary | ICD-10-CM | POA: Diagnosis not present

## 2024-08-14 LAB — PREPARE RBC (CROSSMATCH)

## 2024-08-14 LAB — COMPREHENSIVE METABOLIC PANEL WITH GFR
ALT: 14 U/L (ref 0–44)
AST: 14 U/L — ABNORMAL LOW (ref 15–41)
Albumin: 3.2 g/dL — ABNORMAL LOW (ref 3.5–5.0)
Alkaline Phosphatase: 90 U/L (ref 38–126)
Anion gap: 9 (ref 5–15)
BUN: 12 mg/dL (ref 6–20)
CO2: 23 mmol/L (ref 22–32)
Calcium: 8.8 mg/dL — ABNORMAL LOW (ref 8.9–10.3)
Chloride: 106 mmol/L (ref 98–111)
Creatinine, Ser: 0.56 mg/dL (ref 0.44–1.00)
GFR, Estimated: >60 mL/min (ref >60)
Glucose, Bld: 93 mg/dL (ref 70–99)
Potassium: 4.1 mmol/L (ref 3.5–5.1)
Sodium: 138 mmol/L (ref 135–145)
Total Bilirubin: 0.5 mg/dL (ref 0.0–1.2)
Total Protein: 7.5 g/dL (ref 6.5–8.1)

## 2024-08-14 LAB — CBC
HCT: 25.3 % — ABNORMAL LOW (ref 36.0–46.0)
Hemoglobin: 6.7 g/dL — CL (ref 12.0–15.0)
MCH: 14.7 pg — ABNORMAL LOW (ref 26.0–34.0)
MCHC: 26.5 g/dL — ABNORMAL LOW (ref 30.0–36.0)
MCV: 55.5 fL — ABNORMAL LOW (ref 80.0–100.0)
Platelets: 515 K/uL — ABNORMAL HIGH (ref 150–400)
RBC: 4.56 MIL/uL (ref 3.87–5.11)
RDW: 23.5 % — ABNORMAL HIGH (ref 11.5–15.5)
WBC: 7.2 K/uL (ref 4.0–10.5)
nRBC: 0 % (ref 0.0–0.2)

## 2024-08-14 LAB — IRON AND TIBC
Iron: 15 ug/dL — ABNORMAL LOW (ref 28–170)
Saturation Ratios: 4 % — ABNORMAL LOW (ref 10.4–31.8)
TIBC: 386 ug/dL (ref 250–450)
UIBC: 371 ug/dL

## 2024-08-14 LAB — FERRITIN: Ferritin: 3 ng/mL — ABNORMAL LOW (ref 11–307)

## 2024-08-14 LAB — HCG, SERUM, QUALITATIVE: Preg, Serum: NEGATIVE

## 2024-08-14 MED ORDER — AMPHETAMINE-DEXTROAMPHETAMINE 20 MG PO TABS: 20.0000 mg | ORAL_TABLET | Freq: Every day | ORAL | Status: DC

## 2024-08-14 MED ORDER — ATOMOXETINE HCL 40 MG PO CAPS
40.0000 mg | ORAL_CAPSULE | Freq: Every day | ORAL | Status: DC
  Administered 2024-08-15: 40 mg via ORAL
  Filled 2024-08-14: qty 1

## 2024-08-14 MED ORDER — TOPIRAMATE 25 MG PO TABS: 50.0000 mg | ORAL_TABLET | Freq: Two times a day (BID) | ORAL | Status: DC

## 2024-08-14 MED ORDER — PANTOPRAZOLE SODIUM 40 MG PO TBEC
40.0000 mg | DELAYED_RELEASE_TABLET | Freq: Every day | ORAL | Status: DC
  Administered 2024-08-15: 40 mg via ORAL
  Filled 2024-08-14: qty 1

## 2024-08-14 MED ORDER — SODIUM CHLORIDE 0.9% IV SOLUTION: Freq: Once | INTRAVENOUS | Status: AC

## 2024-08-14 MED ORDER — BUPROPION HCL ER (XL) 300 MG PO TB24
300.0000 mg | ORAL_TABLET | Freq: Every day | ORAL | Status: DC
  Administered 2024-08-15: 300 mg via ORAL
  Filled 2024-08-14: qty 1

## 2024-08-14 NOTE — ED Provider Notes (Addendum)
 [place] EMERGENCY DEPARTMENT AT [facility] Provider Note   CSN: [id number] Arrival date & time: 08/14/24  1608     Patient presents with: Weakness   [name] is a 33 y.o. female.   Patient here because she thinks her blood counts are low.  Known to have a history of anemia.  Patient not seen by us  very frequently.  Last seen in June 2024 patient also seen for symptomatic anemia in 2023.  Past medical history significant for obesity asthma narcolepsy with cataplexy syndrome clotting disorder shortness of breath sleep apnea anemia.  Patient with known symptomatic anemia.  Sounds like she is not really followed by hematology.  She came in today because she thought her symptoms with her being so tired was related to that.  Felt very weak on her feet.  No active bleeding.  Does have a history of heavy periods.  No blood in her bowel movements.       Prior to Admission medications   Medication Sig Start Date End Date Taking? Authorizing Provider  amoxicillin -clavulanate (AUGMENTIN ) 875-125 MG tablet Take 1 tablet by mouth 2 (two) times daily. 06/08/24   Gladis Elsie BROCKS, PA-C  amphetamine -dextroamphetamine  (ADDERALL) 20 MG tablet Take 1 tablet (20 mg total) by mouth daily. 12/16/23   Harl Zane BRAVO, NP  atomoxetine  (STRATTERA ) 40 MG capsule Take 1 capsule (40 mg total) by mouth daily. 05/05/24   Harl Zane BRAVO, NP  buPROPion  (WELLBUTRIN  XL) 300 MG 24 hr tablet Take 1 tablet (300 mg total) by mouth every morning. 05/05/24   Parsons, Brittney E, NP  cetirizine  (ZYRTEC ) 10 MG tablet Take 1 tablet (10 mg total) by mouth daily. 02/18/23   Newlin, Enobong, MD  cyclobenzaprine  (FLEXERIL ) 10 MG tablet Take 1 tablet (10 mg total) by mouth at bedtime as needed for muscle spasms. 05/28/23   Newlin, Enobong, MD  famotidine  (PEPCID ) 20 MG tablet Take 1 tablet (20 mg total) by mouth daily. 04/30/23   Jerrol Agent, MD  hydrOXYzine  (ATARAX ) 10 MG tablet Take 1 tablet (10 mg  total) by mouth 3 (three) times daily as needed. 05/05/24   Harl Zane E, NP  Iron , Ferrous Sulfate , 325 (65 Fe) MG TABS Take 1 tablet by mouth daily. 11/25/23   Newlin, Enobong, MD  meloxicam  (MOBIC ) 7.5 MG tablet Take 1 tablet (7.5 mg total) by mouth daily. 05/28/23   Newlin, Enobong, MD  omeprazole  (PRILOSEC) 40 MG capsule Take 1 capsule (40 mg total) by mouth daily. 01/07/24   Danton Jon HERO, PA-C  ondansetron  (ZOFRAN ) 4 MG tablet Take 1 tablet (4 mg total) by mouth every 8 (eight) hours as needed for nausea or vomiting. 01/07/24   Danton Jon HERO, PA-C  Semaglutide -Weight Management (WEGOVY ) 0.25 MG/0.5ML SOAJ Inject 0.25 mg into the skin once a week. 12/03/23   Danford, Rockie D, NP  topiramate  (TOPAMAX ) 50 MG tablet Take 1 tablet (50 mg total) by mouth 2 (two) times daily. 08/01/22   Danton Jon HERO, PA-C  tranexamic acid  (LYSTEDA ) 650 MG TABS tablet Take 2 tablets (1,300 mg total) by mouth 3 (three) times daily. Take during menses for a maximum of five days 09/25/22   Ajewole, Christana, MD  Vitamin D , Ergocalciferol , (DRISDOL ) 1.25 MG (50000 UNIT) CAPS capsule Take 1 capsule (50,000 Units total) by mouth 2 (two) times a week. 12/04/23   Danford, Rockie BIRCH, NP    Allergies: Banana and Food    Review of Systems  Constitutional:  Positive for fatigue.  Negative for chills and fever.  HENT:  Negative for ear pain and sore throat.   Eyes:  Negative for pain and visual disturbance.  Respiratory:  Negative for cough and shortness of breath.   Cardiovascular:  Negative for chest pain and palpitations.  Gastrointestinal:  Negative for abdominal pain, blood in stool and vomiting.  Genitourinary:  Negative for dysuria, hematuria and vaginal bleeding.  Musculoskeletal:  Negative for arthralgias and back pain.  Skin:  Negative for color change and rash.  Neurological:  Negative for seizures and syncope.  All other systems reviewed and are negative.   Updated Vital Signs BP 135/69 (BP  Location: Right Wrist)   Pulse 93   Temp 98.5 F (36.9 C)   Resp 19   Ht 1.6 m (5' 3)   Wt (!) 186.4 kg   LMP 08/02/2024 (Approximate)   SpO2 96%   BMI 72.81 kg/m   Physical Exam Vitals and nursing note reviewed.  Constitutional:      General: She is not in acute distress.    Appearance: She is well-developed.  HENT:     Head: Normocephalic and atraumatic.  Eyes:     Conjunctiva/sclera: Conjunctivae normal.  Cardiovascular:     Rate and Rhythm: Normal rate and regular rhythm.     Heart sounds: No murmur heard. Pulmonary:     Effort: Pulmonary effort is normal. No respiratory distress.     Breath sounds: Normal breath sounds.  Abdominal:     Palpations: Abdomen is soft.     Tenderness: There is no abdominal tenderness.  Musculoskeletal:        General: No swelling.     Cervical back: Neck supple.  Skin:    General: Skin is warm and dry.     Capillary Refill: Capillary refill takes less than 2 seconds.  Neurological:     General: No focal deficit present.     Mental Status: She is alert and oriented to person, place, and time.  Psychiatric:        Mood and Affect: Mood normal.     (all labs ordered are listed, but only abnormal results are displayed) Labs Reviewed  COMPREHENSIVE METABOLIC PANEL WITH GFR - Abnormal; Notable for the following components:      Result Value   Calcium 8.8 (*)    Albumin 3.2 (*)    AST 14 (*)    All other components within normal limits  CBC - Abnormal; Notable for the following components:   Hemoglobin 6.7 (*)    HCT 25.3 (*)    MCV 55.5 (*)    MCH 14.7 (*)    MCHC 26.5 (*)    RDW 23.5 (*)    Platelets 515 (*)    All other components within normal limits  HCG, SERUM, QUALITATIVE  TYPE AND SCREEN    EKG: None  Radiology: No results found.   Procedures   Medications Ordered in the ED - No data to display                                  Medical Decision Making Amount and/or Complexity of Data Reviewed Labs:  ordered.  Risk Prescription drug management. Decision regarding hospitalization.   Patient's complete metabolic panel normal albumin 3.1 GFR greater than 60 electrolytes normal LFTs normal other than AST down a little bit of 14.  Anion gap normal.  White count 7.2 hemoglobin 6.7 hematocrit 23.1  platelets are 515.  Pregnancy test negative.  Patient gives a history of chronic blood loss according to oncology.  Will clarify the details on that.  Patient states that probably menstrual has been the cause for the blood loss.  Not really following with hematology.  Patient seems to be symptomatic with her hemoglobin of 6.7.  Will transfuse 2 units of blood and get her admitted.  Blood transfusion ordered.  CRITICAL CARE Performed by: Fonda Rochon Total critical care time: 45 minutes Critical care time was exclusive of separately billable procedures and treating other patients. Critical care was necessary to treat or prevent imminent or life-threatening deterioration. Critical care was time spent personally by me on the following activities: development of treatment plan with patient and/or surrogate as well as nursing, discussions with consultants, evaluation of patient's response to treatment, examination of patient, obtaining history from patient or surrogate, ordering and performing treatments and interventions, ordering and review of laboratory studies, ordering and review of radiographic studies, pulse oximetry and re-evaluation of patient's condition.    Final diagnoses:  Anemia, unspecified type    ED Discharge Orders     None          Geraldene Hamilton, MD 08/14/24 LLEWELLYN    Donja Tipping, MD 08/14/24 [id number]    Geraldene Hamilton, MD 08/14/24 2006

## 2024-08-14 NOTE — ED Triage Notes (Signed)
 PT feels her blood is low.  States she is anemic and has been feeling week for the last 2 weeks.

## 2024-08-14 NOTE — H&P (Signed)
 History and Physical    [name] FMW:[id number] DOB: 04/25/1991 DOA: 08/14/2024  PCP: Delbert Clam, MD Patient coming from: home  Chief Complaint: generalized weakness and fatigue  HPI: [name] is a 33 y.o. female with medical history significant of GERD, microcytic anemia, menorrhagia, asthma, depression, narcolepsy cataplexy syndrome and obstructive sleep apnea. The patient is a 33 year old female who presents to the emergency room with complaint of generalized fatigue and weakness.  Patient states that her symptoms have been getting progressively worse for about a week.  Generally when this occurs she has noted that her blood count is on the lower side.  She states that she went to the restroom and today she felt so tired that she thought she lost circulation in her leg. She states that when her blood flow is poor this is also usually an indication that her count is low. This is not knew to her and she states that it was secondary to her menstrual cycle. It appears that her family medicine physician placed her on ferrous sulfate  but the patient has not been complaint with this medication. The patient also states that she had a scheduled appointment to follow up with Ob/Gyn for the same. However she missed the appointment a few months ago and has not rescheduled. ED Course:  In the ER, BP 110/69, HR 90, RR20, O2 saturation 100% on RA,  and Tmax 98.5. Cbc demonstrated wbc 7.2,  hb/hct 6.7/25.3, and platelet 545. MCV is 55.5. Chemistry demonstrated Na 138, K 4.1, Cl 106, bicarb 23, Bun/Cr  12/0.56 and glucose 93.   Review of Systems:  All systems reviewed and apart from history of presenting illness, are negative.  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   ahi 5.5 ,mslt  07/01/06(off meds) mean latency 0 , with 4 sorems npsg 04/24/2010 2.2/hr   Exogenous obesity    H/O blood clots    Heart burn    Narcolepsy cataplexy syndrome 2007   Prior sleep studies starting on September 26 2005, AHI 5.3 per hour,   Poor sleep hygiene    Shortness of breath    Sleep apnea     Past Surgical History:  Procedure Laterality Date   none       reports that she has quit smoking. Her smoking use included cigarettes. She quit smokeless tobacco use about 2 years ago. She reports current alcohol  use. She reports current drug use. Drug: Marijuana.  Allergies  Allergen Reactions   Banana Itching   Food Itching    All fruits    Family History  Problem Relation Age of Onset   Diabetes Mother    Hypertension Mother    Asthma Mother    Heart disease Mother    Depression Mother    Anxiety disorder Mother    Obesity Mother    Depression Father    Anxiety disorder Father    Cancer Father    Diabetes Father    Alcohol  abuse Father    Alcoholism Father    Drug abuse Father    Asthma Brother    Alcohol  abuse Maternal Grandmother     Prior to Admission medications   Medication  Sig Start Date End Date Taking? Authorizing Provider  amoxicillin -clavulanate (AUGMENTIN ) 875-125 MG tablet Take 1 tablet by mouth 2 (two) times daily. 06/08/24   Gladis Elsie BROCKS, PA-C  amphetamine -dextroamphetamine  (ADDERALL) 20 MG tablet Take 1 tablet (20 mg total) by mouth daily. 12/16/23   Harl Zane BRAVO, NP  atomoxetine  (STRATTERA ) 40 MG capsule Take 1 capsule (40 mg total) by mouth daily. 05/05/24   Harl Zane BRAVO, NP  buPROPion  (WELLBUTRIN  XL) 300 MG 24 hr tablet Take 1 tablet (300 mg total) by mouth every morning. 05/05/24   Parsons, Brittney E, NP  cetirizine  (ZYRTEC ) 10 MG tablet Take 1 tablet (10 mg total) by mouth daily. 02/18/23   Newlin, Enobong, MD  cyclobenzaprine  (FLEXERIL ) 10 MG tablet Take 1 tablet (10 mg total) by mouth at bedtime as needed for muscle spasms. 05/28/23    Newlin, Enobong, MD  famotidine  (PEPCID ) 20 MG tablet Take 1 tablet (20 mg total) by mouth daily. 04/30/23   Jerrol Agent, MD  hydrOXYzine  (ATARAX ) 10 MG tablet Take 1 tablet (10 mg total) by mouth 3 (three) times daily as needed. 05/05/24   Harl Zane E, NP  Iron , Ferrous Sulfate , 325 (65 Fe) MG TABS Take 1 tablet by mouth daily. 11/25/23   Newlin, Enobong, MD  meloxicam  (MOBIC ) 7.5 MG tablet Take 1 tablet (7.5 mg total) by mouth daily. 05/28/23   Newlin, Enobong, MD  omeprazole  (PRILOSEC) 40 MG capsule Take 1 capsule (40 mg total) by mouth daily. 01/07/24   Danton Jon HERO, PA-C  ondansetron  (ZOFRAN ) 4 MG tablet Take 1 tablet (4 mg total) by mouth every 8 (eight) hours as needed for nausea or vomiting. 01/07/24   Danton Jon HERO, PA-C  Semaglutide -Weight Management (WEGOVY ) 0.25 MG/0.5ML SOAJ Inject 0.25 mg into the skin once a week. 12/03/23   Danford, Rockie D, NP  topiramate  (TOPAMAX ) 50 MG tablet Take 1 tablet (50 mg total) by mouth 2 (two) times daily. 08/01/22   McClung, Angela M, PA-C  tranexamic acid  (LYSTEDA ) 650 MG TABS tablet Take 2 tablets (1,300 mg total) by mouth 3 (three) times daily. Take during menses for a maximum of five days 09/25/22   Ajewole, Christana, MD  Vitamin D , Ergocalciferol , (DRISDOL ) 1.25 MG (50000 UNIT) CAPS capsule Take 1 capsule (50,000 Units total) by mouth 2 (two) times a week. 12/04/23   Jonel Rockie D, NP    Physical Exam: Vitals:   08/14/24 1612 08/14/24 1617  BP: 135/69   Pulse: 93   Resp: 19   Temp: 98.5 F (36.9 C)   SpO2: 96%   Weight:  (!) 186.4 kg  Height:  5' 3 (1.6 m)    Physical Exam Constitutional:      General: She is not in acute distress.    Appearance: Normal appearance. Morbid obesity  HENT:     Head: Normocephalic and atraumatic.  Eyes:     Extraocular Movements: Extraocular movements intact.     Conjunctiva/sclera: Conjunctivae normal.     Pupils: Pupils are equal, round, and reactive to light.  Cardiovascular:      Rate and Rhythm: Normal rate and regular rhythm.     Pulses: Normal pulses.     Heart sounds: Normal heart sounds.  Pulmonary:     Effort: Pulmonary effort is normal. No respiratory distress.     Breath sounds: Normal breath sounds. No wheezing, rhonchi or rales.  Abdominal:     General: Abdomen is flat. Bowel sounds are normal. There is no distension.  Palpations: Abdomen is soft.     Tenderness: There is no abdominal tenderness.  Musculoskeletal:        General: No deformity. Normal range of motion.  Skin:    General: Skin is warm and dry.     Coloration: Skin is not jaundiced.  Neurological:     General: No focal deficit present.     Mental Status: She is alert and oriented to person, place, and time. Mental status is at baseline.   Labs on Admission: I have personally reviewed following labs and imaging studies  CBC: Recent Labs  Lab 08/14/24 1617  WBC 7.2  HGB 6.7*  HCT 25.3*  MCV 55.5*  PLT 515*   Basic Metabolic Panel: Recent Labs  Lab 08/14/24 1617  NA 138  K 4.1  CL 106  CO2 23  GLUCOSE 93  BUN 12  CREATININE 0.56  CALCIUM 8.8*   GFR: Estimated Creatinine Clearance: 164.2 mL/min (by C-G formula based on SCr of 0.56 mg/dL). Liver Function Tests: Recent Labs  Lab 08/14/24 1617  AST 14*  ALT 14  ALKPHOS 90  BILITOT 0.5  PROT 7.5  ALBUMIN 3.2*   No results for input(s): LIPASE, AMYLASE in the last 168 hours. No results for input(s): AMMONIA in the last 168 hours. Coagulation Profile: No results for input(s): INR, PROTIME in the last 168 hours. Cardiac Enzymes: No results for input(s): CKTOTAL, CKMB, CKMBINDEX, TROPONINI in the last 168 hours. BNP (last 3 results) No results for input(s): PROBNP in the last 8760 hours. HbA1C: No results for input(s): HGBA1C in the last 72 hours. CBG: No results for input(s): GLUCAP in the last 168 hours. Lipid Profile: No results for input(s): CHOL, HDL, LDLCALC, TRIG,  CHOLHDL, LDLDIRECT in the last 72 hours. Thyroid Function Tests: No results for input(s): TSH, T4TOTAL, FREET4, T3FREE, THYROIDAB in the last 72 hours. Anemia Panel: No results for input(s): VITAMINB12, FOLATE, FERRITIN, TIBC, IRON , RETICCTPCT in the last 72 hours. Urine analysis:    Component Value Date/Time   COLORURINE YELLOW 07/01/2017 1848   APPEARANCEUR HAZY (A) 07/01/2017 1848   LABSPEC 1.032 (H) 07/01/2017 1848   PHURINE 5.0 07/01/2017 1848   GLUCOSEU NEGATIVE 07/01/2017 1848   HGBUR MODERATE (A) 07/01/2017 1848   BILIRUBINUR negative 01/07/2024 1712   KETONESUR negative 01/07/2024 1712   KETONESUR NEGATIVE 07/01/2017 1848   PROTEINUR NEGATIVE 07/01/2017 1848   UROBILINOGEN 1.0 01/07/2024 1712   NITRITE Negative 01/07/2024 1712   NITRITE NEGATIVE 07/01/2017 1848   LEUKOCYTESUR Negative 01/07/2024 1712    Radiological Exams on Admission: No results found.  EKG: Independently reviewed. ***  Assessment/Plan Active Problems:   Attention deficit hyperactivity disorder (ADHD), predominantly inattentive type   Iron  deficiency anemia   MENORRHAGIA   Morbid obesity with body mass index of 70 and over in adult [facility])   Depression   Narcolepsy without cataplexy   OSA (obstructive sleep apnea)   Iron  deficiency anemia Microcytic anemia Hemoglobin is 6.7 Appears that her baseline is 7-8 Iron  profile has been ordered for further evaluation The patient denies any dark stool or hematemesis She states that her menstrual cycles are heavy  She is scheduled to followup with obgyn but has not been compliant The patient has also not been taking her ferrous sulfate  as prescribed She will be transfused 2 units of blood Repeat cbc in the morning for followup She will also need to have her outpatient reevaluation with ob and restart her iron  supplementation   GERD Will continue protonix   at this time  Asthma/ bronchitis Stable at this time  Depression   Will continue home medications   Narcolepsy cataplexy syndrome Patient appears to be stable at this time    DVT prophylaxis: scds  Code Status: full  Family Communication: none were present at bedside  Disposition Plan:  patient likely meets observation status     Consults called: none Admission status: observation  Level of care: Level of care:  The medical decision making on this patient was of high complexity and the patient is at high risk for clinical deterioration, therefore this is a level 3 visit.  The medical decision making is of moderate complexity, therefore this is a level 2 visit.  Bradly MARLA Drones MD [facility]  If 7PM-7AM, please contact night-coverage [email or web address]  08/14/2024, 7:53 PM

## 2024-08-14 NOTE — ED Notes (Signed)
 Pt given lemon-lime soda with ice

## 2024-08-15 ENCOUNTER — Encounter: Payer: Self-pay | Admitting: Internal Medicine

## 2024-08-15 DIAGNOSIS — E669 Obesity, unspecified: Secondary | ICD-10-CM | POA: Diagnosis not present

## 2024-08-15 DIAGNOSIS — D649 Anemia, unspecified: Secondary | ICD-10-CM | POA: Diagnosis not present

## 2024-08-15 DIAGNOSIS — N920 Excessive and frequent menstruation with regular cycle: Secondary | ICD-10-CM | POA: Diagnosis not present

## 2024-08-15 DIAGNOSIS — D500 Iron deficiency anemia secondary to blood loss (chronic): Secondary | ICD-10-CM

## 2024-08-15 LAB — BASIC METABOLIC PANEL WITH GFR
Anion gap: 10 (ref 5–15)
BUN: 10 mg/dL (ref 6–20)
CO2: 21 mmol/L — ABNORMAL LOW (ref 22–32)
Calcium: 8.6 mg/dL — ABNORMAL LOW (ref 8.9–10.3)
Chloride: 105 mmol/L (ref 98–111)
Creatinine, Ser: 0.47 mg/dL (ref 0.44–1.00)
GFR, Estimated: >60 mL/min (ref >60)
Glucose, Bld: 120 mg/dL — ABNORMAL HIGH (ref 70–99)
Potassium: 3.5 mmol/L (ref 3.5–5.1)
Sodium: 136 mmol/L (ref 135–145)

## 2024-08-15 LAB — BPAM RBC
Blood Product Expiration Date: 202509182359
Blood Product Expiration Date: 202509182359
ISSUE DATE / TIME: 202508302034
ISSUE DATE / TIME: 202508302239
Unit Type and Rh: 5100
Unit Type and Rh: 5100

## 2024-08-15 LAB — CBC WITH DIFFERENTIAL/PLATELET
Basophils Absolute: 0 K/uL (ref 0.0–0.1)
Basophils Relative: 0 %
Eosinophils Absolute: 0 K/uL (ref 0.0–0.5)
Eosinophils Relative: 0 %
HCT: 27.9 % — ABNORMAL LOW (ref 36.0–46.0)
Hemoglobin: 7.5 g/dL — ABNORMAL LOW (ref 12.0–15.0)
Lymphocytes Relative: 22 %
Lymphs Abs: 1.8 K/uL (ref 0.7–4.0)
MCH: 15.9 pg — ABNORMAL LOW (ref 26.0–34.0)
MCHC: 26.9 g/dL — ABNORMAL LOW (ref 30.0–36.0)
MCV: 59.1 fL — ABNORMAL LOW (ref 80.0–100.0)
Monocytes Absolute: 0.3 K/uL (ref 0.1–1.0)
Monocytes Relative: 4 %
Neutro Abs: 6.1 K/uL (ref 1.7–7.7)
Neutrophils Relative %: 74 %
Platelets: 393 K/uL (ref 150–400)
RBC: 4.72 MIL/uL (ref 3.87–5.11)
RDW: 27.2 % — ABNORMAL HIGH (ref 11.5–15.5)
WBC: 8.3 K/uL (ref 4.0–10.5)
nRBC: 0 % (ref 0.0–0.2)

## 2024-08-15 LAB — TYPE AND SCREEN
ABO/RH(D): O POS
Antibody Screen: POSITIVE
Donor AG Type: NEGATIVE
Donor AG Type: NEGATIVE
Unit division: 0
Unit division: 0

## 2024-08-15 LAB — HIV ANTIBODY (ROUTINE TESTING W REFLEX): HIV Screen 4th Generation wRfx: NONREACTIVE

## 2024-08-15 MED ORDER — POLYSACCHARIDE IRON COMPLEX 150 MG PO CAPS
150.0000 mg | ORAL_CAPSULE | Freq: Two times a day (BID) | ORAL | 0 refills | Status: DC
  Filled 2024-08-15 – 2024-08-17 (×2): qty 60, 30d supply, fill #0

## 2024-08-15 NOTE — Subjective & Objective (Signed)
 Pt seen and examined. Transfused with 2 units PRBC. HgB up to 7.5 g/dl this AM.

## 2024-08-15 NOTE — Assessment & Plan Note (Signed)
 08-15-2024 admit HgB of 6.7 g/dl. Transfused with 2 units PRBC. HgB up to 7.5 g/dl. DC to home on nu-iron  150 mg bid. Referral sent to pt's prior OB/GYN Dr. Jeralyn to see pt for menorrhagia. Stable for DC today.

## 2024-08-15 NOTE — Assessment & Plan Note (Addendum)
 08-15-2024 admit HgB of 6.7 g/dl. Transfused with 2 units PRBC. HgB up to 7.5 g/dl. DC to home on nu-iron  150 mg bid. Referral sent to pt's prior OB/GYN Dr. Jeralyn to see pt for menorrhagia. Stable for DC today.  Iron /TIBC/Ferritin/ %Sat    Component Value Date/Time   IRON  15 (L) 08/14/2024 1926   TIBC 386 08/14/2024 1926   FERRITIN 3 (L) 08/14/2024 1926   IRONPCTSAT 4 (L) 08/14/2024 1926

## 2024-08-15 NOTE — Plan of Care (Signed)
  Problem: Education: Goal: Knowledge of General Education information will improve Description: Including pain rating scale, medication(s)/side effects and non-pharmacologic comfort measures Outcome: Progressing   Problem: Health Behavior/Discharge Planning: Goal: Ability to manage health-related needs will improve Outcome: Progressing   Problem: Clinical Measurements: Goal: Ability to maintain clinical measurements within normal limits will improve Outcome: Progressing Goal: Will remain free from infection Outcome: Progressing Goal: Diagnostic test results will improve Outcome: Progressing Goal: Respiratory complications will improve Outcome: Progressing Goal: Cardiovascular complication will be avoided Outcome: Progressing   Problem: Activity: Goal: Risk for activity intolerance will decrease Outcome: Progressing   Problem: Elimination: Goal: Will not experience complications related to bowel motility Outcome: Progressing Goal: Will not experience complications related to urinary retention Outcome: Progressing   Problem: Pain Managment: Goal: General experience of comfort will improve and/or be controlled Outcome: Progressing   Problem: Skin Integrity: Goal: Risk for impaired skin integrity will decrease Outcome: Progressing   Problem: Safety: Goal: Ability to remain free from injury will improve Outcome: Progressing

## 2024-08-15 NOTE — Assessment & Plan Note (Signed)
 Body mass index is 72.81 kg/m.

## 2024-08-15 NOTE — Hospital Course (Signed)
 CC: anemia HPI: [name] is a 33 y.o. female with medical history significant of GERD, microcytic anemia, menorrhagia, asthma, depression, narcolepsy cataplexy syndrome and obstructive sleep apnea. The patient is a 33 year old female who presents to the emergency room with complaint of generalized fatigue and weakness.  Patient states that her symptoms have been getting progressively worse for about a week.  Generally when this occurs she has noted that her blood count is on the lower side.  She states that she went to the restroom and today she felt so tired that she thought she lost circulation in her leg. She states that when her blood flow is poor this is also usually an indication that her count is low. This is not knew to her and she states that it was secondary to her menstrual cycle. It appears that her family medicine physician placed her on ferrous sulfate  but the patient has not been complaint with this medication. The patient also states that she had a scheduled appointment to follow up with Ob/Gyn for the same. However she missed the appointment a few months ago and has not rescheduled.  Significant Events: Admitted 08/14/2024 for symptomatic iron  deficiency anemia   Admission Labs: wbc 7.2,  hb/hct 6.7/25.3, and platelet 545. MCV is 55.5.  Na 138, K 4.1, Cl 106, bicarb 23, Bun/Cr  12/0.56 and glucose 93.  Total protein 7.5, alb 3.2, AST 14, ALT 14, alk phos 90, T. Bili 0.5 Fe 15, TIBC 386, %sat of 4, ferritin 3  Admission Imaging Studies:   Significant Labs:   Significant Imaging Studies:   Antibiotic Therapy: Anti-infectives (From admission, onward)    None       Procedures: PRBC transfusion x 2 units  Consultants:

## 2024-08-15 NOTE — Plan of Care (Signed)

## 2024-08-15 NOTE — Discharge Summary (Signed)
 [facility] Physician Discharge Summary   Patient name: [name]  Admit date:     08/14/2024  Discharge date: 08/15/2024  Attending Physician: STEPHENS, TIONA K [[id number]]  Discharge Physician: Camellia Door   PCP: Delbert Clam, MD  Admitted From: Home  Disposition:  Home  Recommendations for Outpatient Follow-up:  Follow up with PCP in 1-2 weeks Ambulatory referral made to Dr. Jeralyn with OB/GYN. Call office for appointment in 1 week  Home Health:No Equipment/Devices: None  Discharge Condition:Stable CODE STATUS:FULL Diet recommendation: Heart Healthy Fluid Restriction: None  Hospital Summary: CC: anemia HPI: [name] is a 33 y.o. female with medical history significant of GERD, microcytic anemia, menorrhagia, asthma, depression, narcolepsy cataplexy syndrome and obstructive sleep apnea. The patient is a 33 year old female who presents to the emergency room with complaint of generalized fatigue and weakness.  Patient states that her symptoms have been getting progressively worse for about a week.  Generally when this occurs she has noted that her blood count is on the lower side.  She states that she went to the restroom and today she felt so tired that she thought she lost circulation in her leg. She states that when her blood flow is poor this is also usually an indication that her count is low. This is not knew to her and she states that it was secondary to her menstrual cycle. It appears that her family medicine physician placed her on ferrous sulfate  but the patient has not been complaint with this medication. The patient also states that she had a scheduled appointment to follow up with Ob/Gyn for the same. However she missed the appointment a few months ago and has not rescheduled.  Significant Events: Admitted 08/14/2024 for symptomatic iron  deficiency anemia   Admission Labs: wbc 7.2,  hb/hct 6.7/25.3, and platelet 545. MCV is 55.5.  Na  138, K 4.1, Cl 106, bicarb 23, Bun/Cr  12/0.56 and glucose 93.  Total protein 7.5, alb 3.2, AST 14, ALT 14, alk phos 90, T. Bili 0.5 Fe 15, TIBC 386, %sat of 4, ferritin 3  Admission Imaging Studies:   Significant Labs:   Significant Imaging Studies:   Antibiotic Therapy: Anti-infectives (From admission, onward)    None       Procedures: PRBC transfusion x 2 units  Consultants:    Hospital Course by Problem: * Iron  deficiency anemia due to chronic blood loss 08-15-2024 admit HgB of 6.7 g/dl. Transfused with 2 units PRBC. HgB up to 7.5 g/dl. DC to home on nu-iron  150 mg bid. Referral sent to pt's prior OB/GYN Dr. Jeralyn to see pt for menorrhagia. Stable for DC today.  Iron /TIBC/Ferritin/ %Sat    Component Value Date/Time   IRON  15 (L) 08/14/2024 1926   TIBC 386 08/14/2024 1926   FERRITIN 3 (L) 08/14/2024 1926   IRONPCTSAT 4 (L) 08/14/2024 1926    Symptomatic anemia 08-15-2024 admit HgB of 6.7 g/dl. Transfused with 2 units PRBC. HgB up to 7.5 g/dl. DC to home on nu-iron  150 mg bid. Referral sent to pt's prior OB/GYN Dr. Jeralyn to see pt for menorrhagia. Stable for DC today.  MENORRHAGIA 08-15-2024 admit HgB of 6.7 g/dl. Transfused with 2 units PRBC. HgB up to 7.5 g/dl. DC to home on nu-iron  150 mg bid. Referral sent to pt's prior OB/GYN Dr. Jeralyn to see pt for menorrhagia. Stable for DC today.  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     Discharge Diagnoses:  Principal Problem:   Iron   deficiency anemia due to chronic blood loss Active Problems:   Symptomatic anemia   MENORRHAGIA   Super obesity   Discharge Instructions  Discharge Instructions     Ambulatory referral to Obstetrics / Gynecology   Complete by: As directed    Call MD for:  difficulty breathing, headache or visual disturbances   Complete by: As directed    Call MD for:  extreme fatigue   Complete by: As directed    Call MD for:  hives   Complete by: As directed    Call MD  for:  persistant dizziness or light-headedness   Complete by: As directed    Call MD for:  persistant nausea and vomiting   Complete by: As directed    Call MD for:  redness, tenderness, or signs of infection (pain, swelling, redness, odor or green/yellow discharge around incision site)   Complete by: As directed    Call MD for:  severe uncontrolled pain   Complete by: As directed    Call MD for:  temperature >100.4   Complete by: As directed    Diet - low sodium heart healthy   Complete by: As directed    Discharge instructions   Complete by: As directed    1. Follow up with your primary care provider in 1-2 weeks following discharge from hospital. 2. Ambulatory referral made to OB/GYN to manage your heavy menses. Call office for appointment.   Increase activity slowly   Complete by: As directed       Allergies as of 08/15/2024       Reactions   Banana Itching   Food Itching   All fruits        Medication List     STOP taking these medications    amoxicillin -clavulanate 875-125 MG tablet Commonly known as: AUGMENTIN    amphetamine -dextroamphetamine  20 MG tablet Commonly known as: ADDERALL   cetirizine  10 MG tablet Commonly known as: ZYRTEC    cyclobenzaprine  10 MG tablet Commonly known as: FLEXERIL    famotidine  20 MG tablet Commonly known as: PEPCID    FeroSul 325 (65 Fe) MG tablet Generic drug: ferrous sulfate    meloxicam  7.5 MG tablet Commonly known as: Mobic    ondansetron  4 MG tablet Commonly known as: Zofran    topiramate  50 MG tablet Commonly known as: Topamax    tranexamic acid  650 MG Tabs tablet Commonly known as: LYSTEDA    Wegovy  0.25 MG/0.5ML Soaj SQ injection Generic drug: semaglutide -weight management       TAKE these medications    atomoxetine  40 MG capsule Commonly known as: Strattera  Take 1 capsule (40 mg total) by mouth daily.   buPROPion  300 MG 24 hr tablet Commonly known as: Wellbutrin  XL Take 1 tablet (300 mg total) by mouth  every morning.   hydrOXYzine  10 MG tablet Commonly known as: ATARAX  Take 1 tablet (10 mg total) by mouth 3 (three) times daily as needed.   iron  polysaccharides 150 MG capsule Commonly known as: NIFEREX Take 1 capsule (150 mg total) by mouth 2 (two) times daily.   omeprazole  40 MG capsule Commonly known as: PRILOSEC Take 1 capsule (40 mg total) by mouth daily.   Vitamin D  (Ergocalciferol ) 1.25 MG (50000 UNIT) Caps capsule Commonly known as: DRISDOL  Take 1 capsule (50,000 Units total) by mouth 2 (two) times a week.        Follow-up Information     Jeralyn Crutch, MD. Schedule an appointment as soon as possible for a visit in 1 week(s).   Specialty: Obstetrics and  Gynecology Contact information: [address] [place] [address] [phone]                Allergies  Allergen Reactions   Banana Itching   Food Itching    All fruits    Discharge Exam: Vitals:   08/15/24 0318 08/15/24 0813  BP: 115/69 117/80  Pulse: 85 90  Resp: 18 18  Temp: 97.6 F (36.4 C) 97.9 F (36.6 C)  SpO2: 100%     Physical Exam Vitals and nursing note reviewed.  Constitutional:      Appearance: She is obese.  HENT:     Head: Normocephalic and atraumatic.  Cardiovascular:     Rate and Rhythm: Normal rate and regular rhythm.  Pulmonary:     Effort: Pulmonary effort is normal.     Breath sounds: Normal breath sounds.  Abdominal:     General: Bowel sounds are normal.     Palpations: Abdomen is soft.  Skin:    Capillary Refill: Capillary refill takes less than 2 seconds.  Neurological:     Mental Status: She is alert and oriented to person, place, and time.     The results of significant diagnostics from this hospitalization (including imaging, microbiology, ancillary and laboratory) are listed below for reference.     Labs:  Basic Metabolic Panel: Recent Labs  Lab 08/14/24 1617 08/15/24 0823  NA 138 136  K 4.1 3.5  CL 106 105  CO2 23 21*  GLUCOSE 93 120*   BUN 12 10  CREATININE 0.56 0.47  CALCIUM 8.8* 8.6*   Liver Function Tests: Recent Labs  Lab 08/14/24 1617  AST 14*  ALT 14  ALKPHOS 90  BILITOT 0.5  PROT 7.5  ALBUMIN 3.2*   CBC: Recent Labs  Lab 08/14/24 1617 08/15/24 0823  WBC 7.2 8.3  NEUTROABS  --  6.1  HGB 6.7* 7.5*  HCT 25.3* 27.9*  MCV 55.5* 59.1*  PLT 515* 393   Anemia work up Recent Labs    08/14/24 1926  FERRITIN 3*  TIBC 386  IRON  15*   Iron /TIBC/Ferritin/ %Sat    Component Value Date/Time   IRON  15 (L) 08/14/2024 1926   TIBC 386 08/14/2024 1926   FERRITIN 3 (L) 08/14/2024 1926   IRONPCTSAT 4 (L) 08/14/2024 1926   Sepsis Labs Recent Labs  Lab 08/14/24 1617 08/15/24 0823  WBC 7.2 8.3    Procedures/Studies: No results found.  Time coordinating discharge: 60 mins  SIGNED:  Camellia Door, DO [facility] 08/15/24, 11:07 AM

## 2024-08-15 NOTE — Progress Notes (Signed)
 PROGRESS NOTE    [name]  FMW:[id number] DOB: [birth year] DOA: 08/14/2024 PCP: Delbert Clam, MD  Subjective: Pt seen and examined. Transfused with 2 units PRBC. HgB up to 7.5 g/dl this AM.   Hospital Course: CC: anemia HPI: [name] is a 33 y.o. female with medical history significant of GERD, microcytic anemia, menorrhagia, asthma, depression, narcolepsy cataplexy syndrome and obstructive sleep apnea. The patient is a 33 year old female who presents to the emergency room with complaint of generalized fatigue and weakness.  Patient states that her symptoms have been getting progressively worse for about a week.  Generally when this occurs she has noted that her blood count is on the lower side.  She states that she went to the restroom and today she felt so tired that she thought she lost circulation in her leg. She states that when her blood flow is poor this is also usually an indication that her count is low. This is not knew to her and she states that it was secondary to her menstrual cycle. It appears that her family medicine physician placed her on ferrous sulfate  but the patient has not been complaint with this medication. The patient also states that she had a scheduled appointment to follow up with Ob/Gyn for the same. However she missed the appointment a few months ago and has not rescheduled.  Significant Events: Admitted 08/14/2024 for symptomatic iron  deficiency anemia   Admission Labs: wbc 7.2,  hb/hct 6.7/25.3, and platelet 545. MCV is 55.5.  Na 138, K 4.1, Cl 106, bicarb 23, Bun/Cr  12/0.56 and glucose 93.  Total protein 7.5, alb 3.2, AST 14, ALT 14, alk phos 90, T. Bili 0.5 Fe 15, TIBC 386, %sat of 4, ferritin 3  Admission Imaging Studies:   Significant Labs:   Significant Imaging Studies:   Antibiotic Therapy: Anti-infectives (From admission, onward)    None       Procedures: PRBC transfusion x 2 units  Consultants:      Assessment and Plan: * Iron  deficiency anemia due to chronic blood loss 08-15-2024 admit HgB of 6.7 g/dl. Transfused with 2 units PRBC. HgB up to 7.5 g/dl. DC to home on nu-iron  150 mg bid. Referral sent to pt's prior OB/GYN Dr. Jeralyn to see pt for menorrhagia. Stable for DC today.  Symptomatic anemia 08-15-2024 admit HgB of 6.7 g/dl. Transfused with 2 units PRBC. HgB up to 7.5 g/dl. DC to home on nu-iron  150 mg bid. Referral sent to pt's prior OB/GYN Dr. Jeralyn to see pt for menorrhagia. Stable for DC today.  MENORRHAGIA 08-15-2024 admit HgB of 6.7 g/dl. Transfused with 2 units PRBC. HgB up to 7.5 g/dl. DC to home on nu-iron  150 mg bid. Referral sent to pt's prior OB/GYN Dr. Jeralyn to see pt for menorrhagia. Stable for DC today.  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   DVT prophylaxis: SCDs Start: 08/15/24 0604    Code Status: Full Code Family Communication: no family at bedside. She is decisional. Disposition Plan: return home Reason for continuing need for hospitalization: stable for DC.  Objective: Vitals:   08/14/24 2258 08/15/24 0201 08/15/24 0318 08/15/24 0813  BP: 113/64 108/62 115/69 117/80  Pulse: 84 88 85 90  Resp: 18 17 18 18   Temp: 98.2 F (36.8 C) 98.5 F (36.9 C) 97.6 F (36.4 C) 97.9 F (36.6 C)  TempSrc: Oral Oral Oral Oral  SpO2: 100% 100% 100%   Weight:      Height:  Intake/Output Summary (Last 24 hours) at 08/15/2024 1105 Last data filed at 08/15/2024 0158 Gross per 24 hour  Intake 705 ml  Output --  Net 705 ml   Filed Weights   08/14/24 1617  Weight: (!) 186.4 kg    Examination:  Physical Exam Vitals and nursing note reviewed.  Constitutional:      Appearance: She is obese.  HENT:     Head: Normocephalic and atraumatic.  Cardiovascular:     Rate and Rhythm: Normal rate and regular rhythm.  Pulmonary:     Effort: Pulmonary effort is normal.     Breath sounds: Normal breath sounds.  Abdominal:     General: Bowel  sounds are normal.     Palpations: Abdomen is soft.  Skin:    Capillary Refill: Capillary refill takes less than 2 seconds.  Neurological:     Mental Status: She is alert and oriented to person, place, and time.     Data Reviewed: I have personally reviewed following labs and imaging studies  CBC: Recent Labs  Lab 08/14/24 1617 08/15/24 0823  WBC 7.2 8.3  NEUTROABS  --  6.1  HGB 6.7* 7.5*  HCT 25.3* 27.9*  MCV 55.5* 59.1*  PLT 515* 393   Basic Metabolic Panel: Recent Labs  Lab 08/14/24 1617 08/15/24 0823  NA 138 136  K 4.1 3.5  CL 106 105  CO2 23 21*  GLUCOSE 93 120*  BUN 12 10  CREATININE 0.56 0.47  CALCIUM 8.8* 8.6*   GFR: Estimated Creatinine Clearance: 164.2 mL/min (by C-G formula based on SCr of 0.47 mg/dL). Liver Function Tests: Recent Labs  Lab 08/14/24 1617  AST 14*  ALT 14  ALKPHOS 90  BILITOT 0.5  PROT 7.5  ALBUMIN 3.2*   Anemia Panel: Recent Labs    08/14/24 1926  FERRITIN 3*  TIBC 386  IRON  15*   Scheduled Meds:  atomoxetine   40 mg Oral Daily   buPROPion   300 mg Oral Daily   pantoprazole   40 mg Oral Daily   Continuous Infusions:   LOS: 0 days   Time spent: 55 minutes  Camellia Door, DO  [facility]  08/15/2024, 11:05 AM

## 2024-08-16 ENCOUNTER — Other Ambulatory Visit: Payer: Self-pay

## 2024-08-17 ENCOUNTER — Telehealth: Payer: Self-pay

## 2024-08-17 ENCOUNTER — Other Ambulatory Visit (HOSPITAL_COMMUNITY): Payer: Self-pay

## 2024-08-17 ENCOUNTER — Other Ambulatory Visit: Payer: Self-pay

## 2024-08-17 ENCOUNTER — Encounter: Payer: Self-pay | Admitting: Internal Medicine

## 2024-08-17 NOTE — Transitions of Care (Post Inpatient/ED Visit) (Signed)
   08/17/2024  Name: JACQULYNE GLADUE MRN: [id number] DOB: 1991/10/22  Today's TOC FU Call Status: Today's TOC FU Call Status:: Unsuccessful Call (1st Attempt) Unsuccessful Call (1st Attempt) Date: 08/17/24  Attempted to reach the patient regarding the most recent Inpatient/ED visit.  Follow Up Plan: Additional outreach attempts will be made to reach the patient to complete the Transitions of Care (Post Inpatient/ED visit) call.   Signature  Slater Diesel, RN

## 2024-08-18 ENCOUNTER — Other Ambulatory Visit: Payer: Self-pay

## 2024-08-18 ENCOUNTER — Telehealth: Payer: Self-pay

## 2024-08-18 NOTE — Telephone Encounter (Signed)
 Call returned to patient. She was out walking and fatigued and I asked if she would like me to call her tomorrow and she agreed.    She lives with her mother and is independent with ADLs. She said she is feeling better than when she left the hospital but is still experiencing nausea.  She said she still needs to call GYN for an appointment.  She also stated that she understands she is to stop taking a lot of medications but she continues to take the adderall and flexeril .

## 2024-08-18 NOTE — Transitions of Care (Post Inpatient/ED Visit) (Signed)
   08/18/2024  Name: [name] MRN: [id number] DOB: 1991/04/04  Today's TOC FU Call Status: Today's TOC FU Call Status:: Unsuccessful Call (2nd Attempt) Unsuccessful Call (1st Attempt) Date: 08/17/24 Unsuccessful Call (2nd Attempt) Date: 08/18/24  Attempted to reach the patient regarding the most recent Inpatient/ED visit.  Follow Up Plan: Additional outreach attempts will be made to reach the patient to complete the Transitions of Care (Post Inpatient/ED visit) call.   Signature Slater Diesel, RN

## 2024-08-18 NOTE — Telephone Encounter (Unsigned)
 Copied from CRM #[id number]. Topic: General - Other >> Aug 18, 2024  2:05 PM Yolanda T wrote: Reason for CRM: patient called stated she was returning a call made to her. Please have TOC nurse call her back.

## 2024-08-19 ENCOUNTER — Telehealth: Payer: Self-pay

## 2024-08-19 NOTE — Transitions of Care (Post Inpatient/ED Visit) (Signed)
 08/19/2024  Name: [name] MRN: [id number] DOB: [birth year]  Today's TOC FU Call Status: Today's TOC FU Call Status:: Successful TOC FU Call Completed Unsuccessful Call (1st Attempt) Date: 08/17/24 Unsuccessful Call (2nd Attempt) Date: 08/18/24 [facility] FU Call Complete Date: 08/19/24 Patient's Name and Date of Birth confirmed.  Transition Care Management Follow-up Telephone Call Date of Discharge: 08/15/24 Discharge Facility: [facility]) Type of Discharge: Inpatient Admission Primary Inpatient Discharge Diagnosis:: iron  deficiency anemia How have you been since you were released from the hospital?: Better (She stated that today she is feeling better, no nausea) Any questions or concerns?: Yes Patient Questions/Concerns:: per Epic, she has [employer]. She said her records still show [employer] as her primary insurance and she has bee trying to cancel that.  She stated that she reached out to her DSS caseworker and is waiting for a call back. I offered to give her the number for [employer] member services but she declined and said she wants to speak to her DSS caseworker.  I tried to explained that [employer] is her Medicaid plan now but she still wanted to speak to her DSS caseworker.  She said she is hesitant to schedule an appointments for fear of being charged for the visit due to the [employer]  still showing active. I emphasized the importance of sheduling her follow up appointments and she said she understood Patient Questions/Concerns Addressed: Other: (the patient is waiting for a call back from her DSS caseworker.)  Items Reviewed: Did you receive and understand the discharge instructions provided?: Yes Medications obtained,verified, and reconciled?: Yes (Medications Reviewed) (We also reviewed the list of meds she is to stop taking.She said she is still taking adderall,certirizine, flexeril  and lysteda .She said that Dr Harl told her she could take the adderall  instead of strattera  if she felt the strattera  isn't working.) Any new allergies since your discharge?: No Dietary orders reviewed?: No Do you have support at home?: Yes People in Home [RPT]: parent(s) Name of Support/Comfort Primary Source: her mother  Medications Reviewed Today: Medications Reviewed Today     Reviewed by Marvis Bradley, RN (Case Manager) on 08/19/24 at 1414  Med List Status: <None>   Medication Order Taking? Sig Documenting Provider Last Dose Status Informant  atomoxetine  (STRATTERA ) 40 MG capsule [id number]  Take 1 capsule (40 mg total) by mouth daily. Harl Zane BRAVO, NP  Active   buPROPion  (WELLBUTRIN  XL) 300 MG 24 hr tablet [id number]  Take 1 tablet (300 mg total) by mouth every morning. Harl Zane BRAVO, NP  Active   hydrOXYzine  (ATARAX ) 10 MG tablet [id number]  Take 1 tablet (10 mg total) by mouth 3 (three) times daily as needed. Harl Zane E, NP  Active   iron  polysaccharides (NIFEREX) 150 MG capsule [id number]  Take 1 capsule (150 mg total) by mouth 2 (two) times daily. Laurence Locus, DO  Active   omeprazole  (PRILOSEC) 40 MG capsule [id number]  Take 1 capsule (40 mg total) by mouth daily. Danton Slough M, PA-C  Active   Vitamin D , Ergocalciferol , (DRISDOL ) 1.25 MG (50000 UNIT) CAPS capsule [id number]  Take 1 capsule (50,000 Units total) by mouth 2 (two) times a week. Jonel Rockie BIRCH, NP  Active             Home Care and Equipment/Supplies: Were Home Health Services Ordered?: No Any new equipment or medical supplies ordered?: No  Functional Questionnaire: Do you need assistance with bathing/showering or dressing?: No Do you need assistance with meal  preparation?: No Do you need assistance with eating?: No Do you have difficulty maintaining continence: No Do you need assistance with getting out of bed/getting out of a chair/moving?: Yes (has cane for ambulation) Do you have difficulty managing or taking your medications?: No  Follow up appointments  reviewed: PCP Follow-up appointment confirmed?: No (She said she will call to schedule an appointment because she wants to speak to her DSS caseworker about her insurance first.) MD Provider Line Number:[phone] Given: No Specialist Hospital Follow-up appointment confirmed?: No Reason Specialist Follow-Up Not Confirmed: Patient has Specialist Provider Number and will Call for Appointment (She has the number for GYN and said she will call to schedule the appointment.  I urged her to all as soon as possible.) Do you need transportation to your follow-up appointment?: No (I explained to her that her insurance will provide transportation to her medical appointments and if she has no other option for transportation to [facility], she can call this clinic for a cab ride.) Do you understand care options if your condition(s) worsen?: Yes-patient verbalized understanding    SIGNATURE Slater Diesel, RN

## 2024-08-19 NOTE — Telephone Encounter (Signed)
 We reviewed her current medication list as well as the list of meds she is to stop taking.She has  all of her current medications except she needs to pick up a refill of Vitamin D .   She said she is still taking adderall,certirizine, flexeril  and lysteda  that are on the list to stop taking list.  She stated that Dr Harl told her she could take the adderall instead of strattera  if she felt the strattera  isn't working.   She said she is having issues with her insurance and has reached out to her DSS caseworker for assistance.  Per Epic, she has [employer] and [address].  She has been trying to cancel the [employer] and is hesitant to schedule appointments for fear of being charged for the visit due to the [employer] still showing active. I emphasized the importance of sheduling her follow up appointments and she said she understood

## 2024-08-25 ENCOUNTER — Other Ambulatory Visit: Payer: Self-pay

## 2024-08-25 ENCOUNTER — Other Ambulatory Visit (HOSPITAL_COMMUNITY): Payer: Self-pay

## 2024-09-22 ENCOUNTER — Telehealth: Payer: Self-pay | Admitting: Family Medicine

## 2024-09-22 NOTE — Telephone Encounter (Signed)
 Copied from CRM [id number]. Topic: General - Other >> Sep 22, 2024  3:50 PM Rosaria E wrote: Reason for CRM: Pt called requesting a letter from her PCP, she says it must describe why she cannot work. Please advise, seeking to have this letter mailed to her home address.

## 2024-09-23 NOTE — Telephone Encounter (Signed)
 Patient has been informed that PCP is out of office and letter will not be ready until return.

## 2024-09-30 ENCOUNTER — Telehealth (INDEPENDENT_AMBULATORY_CARE_PROVIDER_SITE_OTHER): Admitting: Psychiatry

## 2024-09-30 ENCOUNTER — Ambulatory Visit: Payer: Self-pay

## 2024-09-30 ENCOUNTER — Other Ambulatory Visit: Payer: Self-pay

## 2024-09-30 ENCOUNTER — Encounter: Payer: Self-pay | Admitting: Internal Medicine

## 2024-09-30 ENCOUNTER — Encounter (HOSPITAL_COMMUNITY): Payer: Self-pay | Admitting: Psychiatry

## 2024-09-30 DIAGNOSIS — F411 Generalized anxiety disorder: Secondary | ICD-10-CM | POA: Diagnosis not present

## 2024-09-30 DIAGNOSIS — F321 Major depressive disorder, single episode, moderate: Secondary | ICD-10-CM | POA: Diagnosis not present

## 2024-09-30 DIAGNOSIS — F900 Attention-deficit hyperactivity disorder, predominantly inattentive type: Secondary | ICD-10-CM | POA: Diagnosis not present

## 2024-09-30 MED ORDER — BUPROPION HCL ER (XL) 300 MG PO TB24
300.0000 mg | ORAL_TABLET | ORAL | 3 refills | Status: AC
  Filled 2024-09-30: qty 30, 30d supply, fill #0

## 2024-09-30 MED ORDER — HYDROXYZINE HCL 10 MG PO TABS
10.0000 mg | ORAL_TABLET | Freq: Three times a day (TID) | ORAL | 3 refills | Status: AC | PRN
Start: 2024-09-30 — End: ?
  Filled 2024-09-30: qty 90, 30d supply, fill #0

## 2024-09-30 MED ORDER — ATOMOXETINE HCL 40 MG PO CAPS
40.0000 mg | ORAL_CAPSULE | Freq: Every day | ORAL | 3 refills | Status: AC
  Filled 2024-09-30: qty 30, 30d supply, fill #0
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
## 2024-09-30 NOTE — Telephone Encounter (Signed)
 FYI Only or Action Required?: Action required by provider: request for appointment.  Patient was last seen in primary care on 06/04/2024 by Vivienne Delon HERO, PA-C.  Called Nurse Triage reporting Numbness, Fatigue, and Snoring.  Symptoms began about 1.5 months ago.  Interventions attempted: Nothing.  Symptoms are: bilateral feet and leg numbness/tingling when sitting/standing/lying for long periods of time, snoring breathing while awake (denies SOB), fatigue gradually worsening. Headache (resolved).  Triage Disposition: See PCP When Office is Open (Within 3 Days)  Patient/caregiver understands and will follow disposition?: Yes             Copied from CRM [id number]. Topic: Clinical - Red Word Triage >> Sep 30, 2024  3:44 PM Montie POUR wrote: Red Word that prompted transfer to Nurse Triage:  She was in the hospital and discharged on 08/15/24. She could not schedule an appointment until she got her insurance taken care of . She now only has Medicaid. She is now losing circulation in her legs and it is getting worse. Reason for Disposition  [1] Numbness or tingling in one or both feet AND [2] is a chronic symptom (recurrent or ongoing AND present > 4 weeks)  Answer Assessment - Initial Assessment Questions Patient states she also needs a refill of her iron .  1. SYMPTOM: What is the main symptom you are concerned about? (e.g., weakness, numbness)     Numbness and tingling in both legs/feet when sitting for a long time, on the toilet or lying down, or standing in one spot for too long.  2. ONSET: When did this start? (e.g., minutes, hours, days; while sleeping)     About 1.5 months.  3. LAST NORMAL: When was the last time you (the patient) were normal (no symptoms)?     She states last normal was before she went to the hospital, seen August 30th.  4. PATTERN Does this come and go, or has it been constant since it started?  Is it present now?     Comes and goes. Not  present now.  5. CARDIAC SYMPTOMS: Have you had any of the following symptoms: chest pain, difficulty breathing, palpitations?     Denies chest pain, palpitations, difficulty breathing.  6. NEUROLOGIC SYMPTOMS: Have you had any of the following symptoms: headache, dizziness, vision loss, double vision, changes in speech, unsteady on your feet?     Denies changes in vision, changes in speech, unsteady on feet. She states from sitting to standing she has to wait a little while before moving her legs. She states she had a headache within the past week. Not currently having a headache.  7. OTHER SYMPTOMS: Do you have any other symptoms?     Fatigue, she states she hears herself snoring while awake been happening for a while. She states she does not feel SOB when she hears the snoring. She states she was told she has a little bit of sleep apnea. Denies blood in stool, nausea, vomiting, cold feet or legs, blue discoloration to legs.  8. PREGNANCY: Is there any chance you are pregnant? When was your last menstrual period?     LMP: unsure, she states thinks she has had a cycle since being discharged end of August.  Protocols used: [facility]

## 2024-10-01 NOTE — Telephone Encounter (Signed)
 Letter has been written.

## 2024-10-01 NOTE — Telephone Encounter (Signed)
 Patient was called and offered and appointment for Monday but she declined, she has been scheduled for 10/18/24 and informed to go to ED if swelling gets worse.

## 2024-10-18 ENCOUNTER — Other Ambulatory Visit: Payer: Self-pay

## 2024-10-18 ENCOUNTER — Other Ambulatory Visit (HOSPITAL_COMMUNITY): Payer: Self-pay

## 2024-10-18 ENCOUNTER — Ambulatory Visit: Attending: Family Medicine | Admitting: Family Medicine

## 2024-10-18 ENCOUNTER — Encounter: Payer: Self-pay | Admitting: Family Medicine

## 2024-10-18 VITALS — BP 121/78 | HR 86 | Temp 98.3°F | Ht 63.0 in | Wt >= 6400 oz

## 2024-10-18 DIAGNOSIS — G47419 Narcolepsy without cataplexy: Secondary | ICD-10-CM | POA: Diagnosis not present

## 2024-10-18 DIAGNOSIS — R600 Localized edema: Secondary | ICD-10-CM | POA: Diagnosis not present

## 2024-10-18 DIAGNOSIS — N3941 Urge incontinence: Secondary | ICD-10-CM | POA: Diagnosis not present

## 2024-10-18 DIAGNOSIS — K219 Gastro-esophageal reflux disease without esophagitis: Secondary | ICD-10-CM | POA: Diagnosis not present

## 2024-10-18 DIAGNOSIS — Z23 Encounter for immunization: Secondary | ICD-10-CM | POA: Diagnosis not present

## 2024-10-18 DIAGNOSIS — N920 Excessive and frequent menstruation with regular cycle: Secondary | ICD-10-CM | POA: Diagnosis not present

## 2024-10-18 DIAGNOSIS — G4733 Obstructive sleep apnea (adult) (pediatric): Secondary | ICD-10-CM | POA: Diagnosis not present

## 2024-10-18 DIAGNOSIS — Z87891 Personal history of nicotine dependence: Secondary | ICD-10-CM | POA: Diagnosis not present

## 2024-10-18 MED ORDER — MISC. DEVICES MISC: 11 refills | Status: AC

## 2024-10-18 MED ORDER — OMEPRAZOLE 40 MG PO CPDR
40.0000 mg | DELAYED_RELEASE_CAPSULE | Freq: Every day | ORAL | 1 refills | Status: AC
  Filled 2024-10-18 (×2): qty 90, 90d supply, fill #0

## 2024-10-18 MED ORDER — FUROSEMIDE 20 MG PO TABS
20.0000 mg | ORAL_TABLET | Freq: Every day | ORAL | 1 refills | Status: AC | PRN
  Filled 2024-10-18 (×2): qty 30, 30d supply, fill #0

## 2024-10-18 NOTE — Patient Instructions (Signed)
 VISIT SUMMARY:  Today, you were seen for leg swelling, numbness, urinary incontinence, mild sleep apnea, and heavy menstrual bleeding. We discussed your symptoms and created a plan to address each of these issues.  YOUR PLAN:  -LOWER EXTREMITY EDEMA: Lower extremity edema means swelling in your legs, which can be caused by high sodium intake and not elevating your legs. We prescribed furosemide  to help reduce the swelling and ordered a potassium level test for next week. We also discussed the possibility of potassium supplements and advised you to elevate your legs. You declined compression stockings due to tightness.  -URGE INCONTINENCE: Urge incontinence is when you experience a sudden need to urinate and sometimes can't make it to the toilet in time. We discussed using incontinence pads and explored Medicaid options for coverage.  -OBSTRUCTIVE SLEEP APNEA: Obstructive sleep apnea is a condition where your breathing stops and starts during sleep, causing snoring and daytime sleepiness. We referred you to a neurologist or sleep specialist for further evaluation and management.  -MENORRHAGIA: Menorrhagia is heavy menstrual bleeding. We referred you to a gynecologist for further evaluation and to schedule a Pap smear.  INSTRUCTIONS:  Please follow up with the potassium level test in one week. Schedule an appointment with a neurologist or sleep specialist for your sleep apnea, and with a gynecologist for your heavy menstrual bleeding and Pap smear. Continue to use incontinence pads as needed and elevate your legs to help with the swelling.
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
## 2024-10-19 ENCOUNTER — Other Ambulatory Visit (HOSPITAL_COMMUNITY): Payer: Self-pay

## 2024-10-19 ENCOUNTER — Other Ambulatory Visit: Payer: Self-pay

## 2024-10-19 ENCOUNTER — Other Ambulatory Visit (INDEPENDENT_AMBULATORY_CARE_PROVIDER_SITE_OTHER): Payer: Self-pay | Admitting: Adult Health

## 2024-10-19 DIAGNOSIS — E559 Vitamin D deficiency, unspecified: Secondary | ICD-10-CM

## 2024-10-25 ENCOUNTER — Ambulatory Visit: Attending: Family Medicine

## 2024-10-25 DIAGNOSIS — R600 Localized edema: Secondary | ICD-10-CM

## 2024-10-26 ENCOUNTER — Ambulatory Visit: Payer: Self-pay | Admitting: Family Medicine

## 2024-10-26 LAB — POTASSIUM: Potassium: 4.5 mmol/L (ref 3.5–5.2)

## 2024-11-09 DIAGNOSIS — R32 Unspecified urinary incontinence: Secondary | ICD-10-CM | POA: Diagnosis not present

## 2024-11-11 DIAGNOSIS — R32 Unspecified urinary incontinence: Secondary | ICD-10-CM | POA: Diagnosis not present

## 2024-11-12 ENCOUNTER — Other Ambulatory Visit (HOSPITAL_COMMUNITY): Payer: Self-pay

## 2024-11-21 ENCOUNTER — Other Ambulatory Visit: Payer: Self-pay

## 2024-11-21 ENCOUNTER — Emergency Department (HOSPITAL_COMMUNITY)
Admission: EM | Admit: 2024-11-21 | Discharge: 2024-11-21 | Disposition: A | Discharge status: Home / Self Care | Attending: Emergency Medicine | Admitting: Emergency Medicine

## 2024-11-21 ENCOUNTER — Encounter (HOSPITAL_COMMUNITY): Payer: Self-pay | Admitting: *Deleted

## 2024-11-21 ENCOUNTER — Emergency Department (HOSPITAL_COMMUNITY)

## 2024-11-21 DIAGNOSIS — R1032 Left lower quadrant pain: Secondary | ICD-10-CM

## 2024-11-21 DIAGNOSIS — R197 Diarrhea, unspecified: Secondary | ICD-10-CM

## 2024-11-21 DIAGNOSIS — R112 Nausea with vomiting, unspecified: Secondary | ICD-10-CM | POA: Diagnosis not present

## 2024-11-21 LAB — CBC
HCT: 28.7 % — ABNORMAL LOW (ref 36.0–46.0)
Hemoglobin: 8.1 g/dL — ABNORMAL LOW (ref 12.0–15.0)
MCH: 16.9 pg — ABNORMAL LOW (ref 26.0–34.0)
MCHC: 28.2 g/dL — ABNORMAL LOW (ref 30.0–36.0)
MCV: 59.8 fL — ABNORMAL LOW (ref 80.0–100.0)
Platelets: 385 K/uL (ref 150–400)
RBC: 4.8 MIL/uL (ref 3.87–5.11)
RDW: 21.1 % — ABNORMAL HIGH (ref 11.5–15.5)
WBC: 7.2 K/uL (ref 4.0–10.5)
nRBC: 0 % (ref 0.0–0.2)

## 2024-11-21 LAB — RESP PANEL BY RT-PCR (RSV, FLU A&B, COVID)  RVPGX2
Influenza A by PCR: NEGATIVE
Influenza B by PCR: NEGATIVE
Resp Syncytial Virus by PCR: NEGATIVE
SARS Coronavirus 2 by RT PCR: NEGATIVE

## 2024-11-21 LAB — URINALYSIS, ROUTINE W REFLEX MICROSCOPIC
Bilirubin Urine: NEGATIVE
Glucose, UA: NEGATIVE mg/dL
Ketones, ur: NEGATIVE mg/dL
Leukocytes,Ua: NEGATIVE
Nitrite: NEGATIVE
Protein, ur: NEGATIVE mg/dL
RBC / HPF: >50 RBC/hpf (ref 0–5)
Specific Gravity, Urine: 1.023 (ref 1.005–1.030)
pH: 6 (ref 5.0–8.0)

## 2024-11-21 LAB — COMPREHENSIVE METABOLIC PANEL WITH GFR
ALT: 16 U/L (ref 0–44)
AST: 12 U/L — ABNORMAL LOW (ref 15–41)
Albumin: 3.2 g/dL — ABNORMAL LOW (ref 3.5–5.0)
Alkaline Phosphatase: 96 U/L (ref 38–126)
Anion gap: 4 — ABNORMAL LOW (ref 5–15)
BUN: 9 mg/dL (ref 6–20)
CO2: 26 mmol/L (ref 22–32)
Calcium: 8.5 mg/dL — ABNORMAL LOW (ref 8.9–10.3)
Chloride: 106 mmol/L (ref 98–111)
Creatinine, Ser: 0.62 mg/dL (ref 0.44–1.00)
GFR, Estimated: >60 mL/min (ref >60)
Glucose, Bld: 99 mg/dL (ref 70–99)
Potassium: 4.3 mmol/L (ref 3.5–5.1)
Sodium: 136 mmol/L (ref 135–145)
Total Bilirubin: 0.7 mg/dL (ref 0.0–1.2)
Total Protein: 7.5 g/dL (ref 6.5–8.1)

## 2024-11-21 LAB — HCG, SERUM, QUALITATIVE: Preg, Serum: NEGATIVE

## 2024-11-21 LAB — LIPASE, BLOOD: Lipase: 22 U/L (ref 11–51)

## 2024-11-21 MED ORDER — IOHEXOL 350 MG/ML SOLN
100.0000 mL | Freq: Once | INTRAVENOUS | Status: AC | PRN
  Administered 2024-11-21: 100 mL via INTRAVENOUS

## 2024-11-21 MED ORDER — LACTATED RINGERS IV BOLUS
500.0000 mL | Freq: Once | INTRAVENOUS | Status: AC
  Administered 2024-11-21: 500 mL via INTRAVENOUS

## 2024-11-21 MED ORDER — FAMOTIDINE IN NACL 20-0.9 MG/50ML-% IV SOLN
20.0000 mg | Freq: Once | INTRAVENOUS | Status: AC
  Administered 2024-11-21: 20 mg via INTRAVENOUS
  Filled 2024-11-21: qty 50

## 2024-11-21 MED ORDER — PANTOPRAZOLE SODIUM 40 MG IV SOLR
40.0000 mg | Freq: Once | INTRAVENOUS | Status: AC
  Administered 2024-11-21: 40 mg via INTRAVENOUS
  Filled 2024-11-21: qty 10

## 2024-11-21 MED ORDER — ONDANSETRON HCL 4 MG PO TABS
4.0000 mg | ORAL_TABLET | Freq: Four times a day (QID) | ORAL | 0 refills | Status: AC
  Filled 2024-11-21 – 2024-11-22 (×2): qty 12, 3d supply, fill #0

## 2024-11-21 NOTE — ED Triage Notes (Signed)
 Pt reports abdominal pain that started 2 weeks ago. Pt also reports nausea and vomiting.

## 2024-11-21 NOTE — Discharge Instructions (Addendum)
 Using a for left lower quad abdominal pain as well as diarrhea after you eat.  Your examination, lab work and imaging were all very reassuring that low suspicion for any emergent cause of your symptoms today.  Recommended continued follow-up with your PCP as well as with [facility] for this diarrhea that you have been experiencing.  Sending in some nausea medication which will also secondarily help with little bit of the diarrhea you have been experiencing.  Continue to hydrate well.  Following up with your PCP for your anemia

## 2024-11-21 NOTE — ED Notes (Signed)
 Pt hooked up to BP and pulse ox on monitor again. Pt now asleep in bed. Call bell within reach.

## 2024-11-21 NOTE — ED Provider Notes (Signed)
 [place] EMERGENCY DEPARTMENT AT [facility] Provider Note   CSN: [id number] Arrival date & time: 11/21/24  1554     Patient presents with: Abdominal Pain   [name] is a 33 y.o. female.  Abdominal Pain  Patient is a 33 year old female presenting ED today for concerns for left flank pain radiating to left lower quadrant abdomen that has been present x 2 weeks, intermittent but has been progressively worse and consistent.  Noted to have had a an episode of vomiting 1 week ago but since then has been able to tolerate fluids n.p.o. since.  Has noted that every time she has eaten she has had watery diarrhea.  Previous history of asthma, blood clots, shortness of breath, clotting disorder, obesity, shortness of breath, anxiety, menorrhagia, anemia  Endorses cough, congestion Currently on her menstrual cycle.  Denies any headache, vision changes, fevers, odynophagia, chest pain, shortness of breath, melena, hematochezia, dysuria, vaginal discharge, vaginal pain, increased lower leg swelling, rashes.    Prior to Admission medications   Medication Sig Start Date End Date Taking? Authorizing Provider  ondansetron  (ZOFRAN ) 4 MG tablet Take 1 tablet (4 mg total) by mouth every 6 (six) hours. 11/21/24  Yes Beola Terrall RAMAN, PA-C  atomoxetine  (STRATTERA ) 40 MG capsule Take 1 capsule (40 mg total) by mouth daily. 09/30/24   Harl Zane BRAVO, NP  buPROPion  (WELLBUTRIN  XL) 300 MG 24 hr tablet Take 1 tablet (300 mg total) by mouth every morning. 09/30/24   Harl Zane BRAVO, NP  furosemide  (LASIX ) 20 MG tablet Take 1 tablet (20 mg total) by mouth daily as needed for edema. 10/18/24   Newlin, Enobong, MD  hydrOXYzine  (ATARAX ) 10 MG tablet Take 1 tablet (10 mg total) by mouth 3 (three) times daily as needed. 09/30/24   Harl Zane BRAVO, NP  iron  polysaccharides (NIFEREX) 150 MG capsule Take 1 capsule (150 mg total) by mouth 2 (two) times daily. 08/15/24 10/18/24  Laurence Locus,  DO  Misc. Devices MISC Depends-3 XL, chuks, gloves 10/18/24   Delbert Clam, MD  omeprazole  (PRILOSEC) 40 MG capsule Take 1 capsule (40 mg total) by mouth daily. 10/18/24   Newlin, Enobong, MD  Vitamin D , Ergocalciferol , (DRISDOL ) 1.25 MG (50000 UNIT) CAPS capsule Take 1 capsule (50,000 Units total) by mouth 2 (two) times a week. Patient not taking: Reported on 10/18/2024 12/04/23   Jonel Rockie BIRCH, NP    Allergies: Banana and Food    Review of Systems  Gastrointestinal:  Positive for abdominal pain.  All other systems reviewed and are negative.   Updated Vital Signs BP 129/81 (BP Location: Left Arm)   Pulse 81   Temp 98.4 F (36.9 C) (Oral)   Resp 17   Ht 5' 3 (1.6 m)   Wt (!) 183.9 kg   LMP 11/21/2024   SpO2 100%   BMI 71.82 kg/m   Physical Exam Vitals and nursing note reviewed.  Constitutional:      General: She is not in acute distress.    Appearance: Normal appearance. She is obese. She is not ill-appearing or diaphoretic.  HENT:     Head: Normocephalic and atraumatic.  Eyes:     General: No scleral icterus.       Right eye: No discharge.        Left eye: No discharge.     Extraocular Movements: Extraocular movements intact.     Conjunctiva/sclera: Conjunctivae normal.  Cardiovascular:     Rate and Rhythm: Normal rate and regular  rhythm.     Pulses: Normal pulses.     Heart sounds: Normal heart sounds. No murmur heard.    No friction rub. No gallop.  Pulmonary:     Effort: Pulmonary effort is normal. No respiratory distress.     Breath sounds: No stridor. No wheezing, rhonchi or rales.  Chest:     Chest wall: No tenderness.  Abdominal:     General: Abdomen is flat. There is no distension.     Palpations: Abdomen is soft.     Tenderness: There is abdominal tenderness in the left lower quadrant. There is no right CVA tenderness, left CVA tenderness, guarding or rebound.     Hernia: No hernia is present.  Musculoskeletal:        General: No swelling,  deformity or signs of injury.     Cervical back: Normal range of motion. No rigidity.     Right lower leg: No edema.     Left lower leg: No edema.  Skin:    General: Skin is warm and dry.     Findings: No bruising, erythema, lesion or rash.  Neurological:     General: No focal deficit present.     Mental Status: She is alert and oriented to person, place, and time. Mental status is at baseline.     Sensory: No sensory deficit.     Motor: No weakness.  Psychiatric:        Mood and Affect: Mood normal.     (all labs ordered are listed, but only abnormal results are displayed) Labs Reviewed  COMPREHENSIVE METABOLIC PANEL WITH GFR - Abnormal; Notable for the following components:      Result Value   Calcium 8.5 (*)    Albumin 3.2 (*)    AST 12 (*)    Anion gap 4 (*)    All other components within normal limits  CBC - Abnormal; Notable for the following components:   Hemoglobin 8.1 (*)    HCT 28.7 (*)    MCV 59.8 (*)    MCH 16.9 (*)    MCHC 28.2 (*)    RDW 21.1 (*)    All other components within normal limits  URINALYSIS, ROUTINE W REFLEX MICROSCOPIC - Abnormal; Notable for the following components:   Hgb urine dipstick LARGE (*)    Bacteria, UA RARE (*)    All other components within normal limits  RESP PANEL BY RT-PCR (RSV, FLU A&B, COVID)  RVPGX2  LIPASE, BLOOD  HCG, SERUM, QUALITATIVE    EKG: None  Radiology: CT ABDOMEN PELVIS W CONTRAST Result Date: 11/21/2024 CLINICAL DATA:  Left lower quadrant abdominal pain EXAM: CT ABDOMEN AND PELVIS WITH CONTRAST TECHNIQUE: Multidetector CT imaging of the abdomen and pelvis was performed using the standard protocol following bolus administration of intravenous contrast. RADIATION DOSE REDUCTION: This exam was performed according to the departmental dose-optimization program which includes automated exposure control, adjustment of the mA and/or kV according to patient size and/or use of iterative reconstruction technique. CONTRAST:   OMNIPAQUE  IOHEXOL  350 MG/ML SOLN COMPARISON:  04/30/2023 FINDINGS: Lower chest: No acute pleural or parenchymal lung disease. Hepatobiliary: No focal liver abnormality is seen. No gallstones, gallbladder wall thickening, or biliary dilatation. Pancreas: Unremarkable. No pancreatic ductal dilatation or surrounding inflammatory changes. Spleen: Normal in size without focal abnormality. Adrenals/Urinary Tract: Adrenal glands are unremarkable. Kidneys are normal, without renal calculi, focal lesion, or hydronephrosis. The bladder is decompressed, limiting its evaluation. Stomach/Bowel: No bowel obstruction or ileus. The appendix  appears unremarkable. No bowel wall thickening or inflammatory change. Vascular/Lymphatic: No significant vascular findings are present. No enlarged abdominal or pelvic lymph nodes. Reproductive: Uterus and bilateral adnexa are unremarkable. Other: No free fluid or free intraperitoneal gas. No abdominal wall hernia. Musculoskeletal: No acute or destructive bony abnormalities. Reconstructed images demonstrate no additional findings. IMPRESSION: 1. No acute intra-abdominal or intrapelvic process. Electronically Signed   By: Ozell Daring M.D.   On: 11/21/2024 20:10    Procedures   Medications Ordered in the ED  lactated ringers  bolus 500 mL (0 mLs Intravenous Stopped 11/21/24 2040)  famotidine  (PEPCID ) IVPB 20 mg premix (0 mg Intravenous Stopped 11/21/24 2017)  pantoprazole  (PROTONIX ) injection 40 mg (40 mg Intravenous Given 11/21/24 1925)  iohexol  (OMNIPAQUE ) 350 MG/ML injection 100 mL (100 mLs Intravenous Contrast Given 11/21/24 2006)       Medical Decision Making Amount and/or Complexity of Data Reviewed Radiology: ordered.  Risk Prescription drug management.   This patient is a 33 year old female who presents to the ED for concern of 2-week history of left lower quad abdominal pain intermittent, but has been progressive and persistent, notably having diarrhea with  every meal.  Reporting to have had an episode of vomiting 1 week ago but has had improved symptoms since and had been able to tolerate p.o.  On physical exam, patient is in no acute distress, afebrile, alert and orient x 4, speaking in full sentences, nontachypneic, nontachycardic.  LCTAB, RRR, no murmur,.  Notable does have some minimal abdominal left lower quadrant tenderness to outpatient, with no guarding, no rebound tenderness.  No rashes noted on examination no bruising.  No CVA tenderness.  Unremarkable exam otherwise.  Patient overall does not appear to have any acute emergency present.  However lab work and imaging was done showing a improved anemia when compared to previous as well as notably having a nonacute CT scan.  Low suspicion for ovarian torsion, PID, obstruction or any other emergent pathology present this time.  Will have her follow-up with GI for the chronic diarrhea as well as  with PCP.  Patient vital signs have remained stable throughout the course of patient's time in the ED. Low suspicion for any other emergent pathology at this time. I believe this patient is safe to be discharged. Provided strict return to ER precautions. Patient expressed agreement and understanding of plan. All questions were answered.  Differential diagnoses prior to evaluation: The emergent differential diagnosis includes, but is not limited to,  Mesenteric ischemia, diverticulitis, nephrolithiasis, constipation, bowel obstruction, IBD,  ectopic pregnancy, ovarian torsion, appendicitis, pancreatitis, PID . This is not an exhaustive differential.   Past Medical History / Co-morbidities / Social History: asthma, blood clots, shortness of breath, clotting disorder, obesity, shortness of breath, anxiety, menorrhagia, anemia  Additional history: Chart reviewed. 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  Started on  furosemide .  Lab Tests/Imaging studies: I personally interpreted labs/imaging and the pertinent results include:    CBC notes anemia with hemoglobin 8.1.  Increased from previous. CMP notes a mild hypocalcemia but otherwise unremarkable Lipase unremarkable hCG qualitative negative UA shows large hemoglobin likely secondary to menstrual cycle otherwise unremarkable Respiratory panel unremarkable CT scan unremarkable for any acute Sharol   I agree with the radiologist interpretation.    Medications: I ordered medication including Zofran .  I have reviewed the patients home medicines and have made adjustments as needed.  Critical Interventions: None  Social  Determinants of Health: Has good follow-up with PCP  Disposition: After consideration of the diagnostic results and the patients response to treatment, I feel that the patient would benefit from discharge and treatment as above.   emergency department workup does not suggest an emergent condition requiring admission or immediate intervention beyond what has been performed at this time. The plan is: Follow-up with PCP, follow-up with GI, return for new or worsening symptoms, Zofran  for nausea. The patient is safe for discharge and has been instructed to return immediately for worsening symptoms, change in symptoms or any other concerns.  Final diagnoses:  LLQ pain  Diarrhea, unspecified type    ED Discharge Orders          Ordered    ondansetron  (ZOFRAN ) 4 MG tablet  Every 6 hours        11/21/24 2139               Beola Terrall RAMAN, PA-C 11/21/24 2148    Tegeler, Lonni PARAS, MD 11/22/24 0025

## 2024-11-21 NOTE — ED Provider Triage Note (Signed)
 Emergency Medicine Provider Triage Evaluation Note  [name] , a 33 y.o. female  was evaluated in triage.  Pt complains of abdominal pain. Sx onset x 2 weeks ago. Worse in LLQ. Reports intermittent vomiting and diarrhea since symptom onset, none currently.  No urinary symptoms.  Review of Systems  Positive:  Negative:   Physical Exam  BP 133/73   Pulse 83   Temp 98.2 F (36.8 C)   Resp 20   Ht 5' 3 (1.6 m)   Wt (!) 183.9 kg   LMP 11/21/2024   SpO2 100%   BMI 71.82 kg/m  Gen:   Awake, no distress   Resp:  Normal effort  MSK:   Moves extremities without difficulty  Other:    Medical Decision Making  Medically screening exam initiated at 4:22 PM.  Appropriate orders placed.  [name] was informed that the remainder of the evaluation will be completed by another provider, this initial triage assessment does not replace that evaluation, and the importance of remaining in the ED until their evaluation is complete.     [name] [name] LABOR, PA-C 11/21/24 1623

## 2024-11-22 ENCOUNTER — Encounter: Payer: Self-pay | Admitting: Gastroenterology

## 2024-11-22 ENCOUNTER — Other Ambulatory Visit (HOSPITAL_COMMUNITY): Payer: Self-pay

## 2024-11-22 ENCOUNTER — Other Ambulatory Visit: Payer: Self-pay

## 2024-11-26 ENCOUNTER — Ambulatory Visit: Payer: Self-pay

## 2024-11-26 NOTE — Telephone Encounter (Signed)
 FYI Only or Action Required?: Action required by provider: request for appointment, clinical question for provider, and update on patient condition.  Patient was last seen in primary care on 10/18/2024 by Newlin, Enobong, MD.  Called Nurse Triage reporting Abdominal Pain.  Symptoms began several weeks ago.  Interventions attempted: Prescription medications: zofran  and Rest, hydration, or home remedies.  Symptoms are: gradually worsening.  Triage Disposition: See Physician Within 24 Hours  Patient/caregiver understands and will follow disposition?: No, wishes to speak with PCP   Copied from CRM #[id number]. Topic: Clinical - Red Word Triage >> Nov 26, 2024 12:33 PM Willma R wrote: Red Word that prompted transfer to Nurse Triage: Patient was seen on 12/07 in the ED. States she is still having abdomen pain. Reason for Disposition  [1] MILD pain (e.g., does not interfere with normal activities) AND [2] pain comes and goes (cramps) AND [3] present > 48 hours  (Exception: This same abdominal pain is a chronic symptom recurrent or ongoing AND present > 4 weeks.)  Answer Assessment - Initial Assessment Questions Pt called in stating she went to ED 12/07 for abdominal pain and diarrhea. She reports they performed imagining and stated her PCP would f/u with her about results but she has not heard from office. She states that abdominal pain and diarrhea is still present. Denies blood in stool, radiation of pain anywhere else. PT is only taking zofran  that was prescribed at ED. Questioned if pt was having n/v but she denied symptoms. Discussed use of tylenol /ibuprofen  for pain but pt denied taking. Pt would like f/u call from PCP d/t no appt availability until January. Pt is scheduled with GI in 12/2024. Please advise.    1. LOCATION: Where does it hurt?      LLQ; pt tx in ED 12/07  2. RADIATION: Does the pain shoot anywhere else? (e.g., chest, back)     No  3. ONSET: When did the pain  begin? (e.g., minutes, hours or days ago)      2 weeks ago  6. SEVERITY: How bad is the pain?  (e.g., Scale 1-10; mild, moderate, or severe)     5  7. RECURRENT SYMPTOM: Have you ever had this type of stomach pain before? If Yes, ask: When was the last time? and What happened that time?      Yes; recently in ED  8. CAUSE: What do you think is causing the stomach pain? (e.g., gallstones, recent abdominal surgery)     Unknown   9. RELIEVING/AGGRAVATING FACTORS: What makes it better or worse? (e.g., antacids, bending or twisting motion, bowel movement)     Nothing   10. OTHER SYMPTOMS: Do you have any other symptoms? (e.g., back pain, diarrhea, fever, urination pain, vomiting)       Diarrhea  Protocols used: Abdominal Pain - Female-A-AH

## 2024-11-26 NOTE — Telephone Encounter (Signed)
 Please reach out to patient and schedule he with any available provider.

## 2024-12-02 ENCOUNTER — Ambulatory Visit: Payer: Self-pay | Attending: *Deleted | Admitting: *Deleted

## 2024-12-02 ENCOUNTER — Encounter: Payer: Self-pay | Admitting: *Deleted

## 2024-12-02 ENCOUNTER — Encounter: Payer: Self-pay | Admitting: Internal Medicine

## 2024-12-02 ENCOUNTER — Other Ambulatory Visit (HOSPITAL_COMMUNITY): Payer: Self-pay

## 2024-12-02 ENCOUNTER — Other Ambulatory Visit: Payer: Self-pay

## 2024-12-02 VITALS — BP 129/85 | HR 99 | Temp 98.8°F | Ht 63.0 in | Wt >= 6400 oz

## 2024-12-02 DIAGNOSIS — R109 Unspecified abdominal pain: Secondary | ICD-10-CM

## 2024-12-02 DIAGNOSIS — D649 Anemia, unspecified: Secondary | ICD-10-CM

## 2024-12-02 MED ORDER — DICYCLOMINE HCL 10 MG PO CAPS
10.0000 mg | ORAL_CAPSULE | Freq: Three times a day (TID) | ORAL | 0 refills | Status: AC
  Filled 2024-12-02 – 2024-12-21 (×3): qty 120, 30d supply, fill #0

## 2024-12-02 MED ORDER — POLYSACCHARIDE IRON COMPLEX 150 MG PO CAPS
150.0000 mg | ORAL_CAPSULE | Freq: Two times a day (BID) | ORAL | 0 refills | Status: AC
  Filled 2024-12-02 (×2): qty 60, 30d supply, fill #0

## 2024-12-02 NOTE — Patient Instructions (Signed)
 We discussed your chronic abdominal pain and diarrhea. We reviewed your lab work from the emergency room including your low hemoglobin with history of anemia. You have been referred to the GI specialist for evaluation of chronic diarrhea. You mentioned crampy abdominal pain not necessarily associated with eating. This sounds like it might be bowel spasm and then made better by a medication called dicyclomine . I will send that to the pharmacy of your choice. Your stomach may feel better if you increase your fiber and fluid intake.   Continue your Zofran  as needed.   Call the clinic if symptoms worsen.  Or return to the emergency room/urgent care center
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
## 2024-12-03 ENCOUNTER — Other Ambulatory Visit: Payer: Self-pay

## 2024-12-03 ENCOUNTER — Encounter: Payer: Self-pay | Admitting: Pharmacist

## 2024-12-08 ENCOUNTER — Other Ambulatory Visit: Payer: Self-pay

## 2024-12-21 ENCOUNTER — Other Ambulatory Visit (HOSPITAL_COMMUNITY): Payer: Self-pay

## 2024-12-21 ENCOUNTER — Other Ambulatory Visit: Payer: Self-pay

## 2024-12-23 ENCOUNTER — Encounter (HOSPITAL_COMMUNITY): Payer: Self-pay

## 2024-12-23 ENCOUNTER — Other Ambulatory Visit: Payer: Self-pay

## 2024-12-23 ENCOUNTER — Telehealth (HOSPITAL_COMMUNITY): Admitting: Psychiatry

## 2024-12-30 ENCOUNTER — Ambulatory Visit: Payer: Self-pay | Admitting: *Deleted

## 2024-12-31 ENCOUNTER — Ambulatory Visit: Admitting: Gastroenterology

## 2024-12-31 NOTE — Progress Notes (Deleted)
 [name]

## 2025-01-06 ENCOUNTER — Telehealth: Admitting: Physician Assistant

## 2025-01-06 ENCOUNTER — Other Ambulatory Visit: Payer: Self-pay

## 2025-01-06 ENCOUNTER — Ambulatory Visit: Payer: Self-pay

## 2025-01-06 ENCOUNTER — Other Ambulatory Visit (HOSPITAL_COMMUNITY): Payer: Self-pay

## 2025-01-06 DIAGNOSIS — K047 Periapical abscess without sinus: Secondary | ICD-10-CM

## 2025-01-06 MED ORDER — AMOXICILLIN-POT CLAVULANATE 875-125 MG PO TABS
1.0000 | ORAL_TABLET | Freq: Two times a day (BID) | ORAL | 0 refills | Status: AC
  Filled 2025-01-06 (×2): qty 14, 7d supply, fill #0

## 2025-01-06 NOTE — Telephone Encounter (Signed)
 1st attempt - left message on second dial-out  Copied from CRM #[id number]. Topic: Clinical - Medication Question >> Jan 06, 2025 10:16 AM Delon DASEN wrote: Reason for CRM: Patient asking for antibiotic for tooth infection, please call [phone]

## 2025-01-06 NOTE — Telephone Encounter (Signed)
 FYI Only or Action Required?: FYI only for provider: appointment scheduled on 01/06/2025 at 2:15pm with [facility] due to patient request and no available appointments today at PCP office---patient also does not have transportation today.  Patient was last seen in primary care on 12/02/2024 by Placey, Ronal Caldron, NP.  Called Nurse Triage reporting Dental Pain.  Symptoms began several weeks ago.  Interventions attempted: OTC medications: tylenol , ibuprofen  and Rest, hydration, or home remedies.  Symptoms are: gradually worsening.  Triage Disposition: See HCP Within 4 Hours (Or PCP Triage)  Patient/caregiver understands and will follow disposition?: Yes    Copied from CRM #[id number]. Topic: Clinical - Medication Question >> Jan 06, 2025 10:16 AM [name] wrote: Reason for CRM: Patient asking for antibiotic for tooth infection, please call [phone] Reason for Disposition  [1] SEVERE pain (e.g., excruciating, unable to eat, unable to do any normal activities) AND [2] not improved 2 hours after pain medicine  Answer Assessment - Initial Assessment Questions Patient states a top back tooth on the right side has been hurting for weeks Patient states she used to have a filling in this tooth and now it's a hole in the tooth  Patient denies difficulty breathing, notable swelling,   She has taken Tylenol , Ibuprofen , and gargle salt water, mouthwash  8 out of 10 pain --pain in upper right jaw  Patient denies any chance of pregnancy  Patient states she cannot come to an appointment today due to transportation She states she can'[name] get transportation to go to an Urgent Care today either  Patient requested a virtual appointment---appointment made with [facility] for today 01/06/2025 at 2:15pm She is provided with the MyChart helpline phone number and advised to call us  or the helpline if she has any issues logging in.  She states she has done a  virtual visit in the past   Patient is advised to call us  back if anything changes or with any further questions/concerns. Patient is advised that if anything worsens to go to the Emergency Room. Patient verbalized understanding.  Protocols used: Toothache-A-AH

## 2025-01-06 NOTE — Patient Instructions (Signed)
 " [name], thank you for joining Delon CHRISTELLA Dickinson, PA-C for today's virtual visit.  While this provider is not your primary care provider (PCP), if your PCP is located in our provider database this encounter information will be shared with them immediately following your visit.   A [facility] MyChart account gives you access to today's visit and all your visits, tests, and labs performed at [facility]  click here if you don't have a [facility] MyChart account or go to [email or web address]  Consent: (Patient) [name] provided verbal consent for this virtual visit at the beginning of the encounter.  Current Medications:  Current Outpatient Medications:    amoxicillin -clavulanate (AUGMENTIN ) 875-125 MG tablet, Take 1 tablet by mouth 2 (two) times daily., Disp: 14 tablet, Rfl: 0   atomoxetine  (STRATTERA ) 40 MG capsule, Take 1 capsule (40 mg total) by mouth daily., Disp: 30 capsule, Rfl: 3   buPROPion  (WELLBUTRIN  XL) 300 MG 24 hr tablet, Take 1 tablet (300 mg total) by mouth every morning., Disp: 30 tablet, Rfl: 3   dicyclomine  (BENTYL ) 10 MG capsule, Take 1 capsule (10 mg total) by mouth 4 (four) times daily -  before meals and at bedtime., Disp: 120 capsule, Rfl: 0   furosemide  (LASIX ) 20 MG tablet, Take 1 tablet (20 mg total) by mouth daily as needed for edema., Disp: 30 tablet, Rfl: 1   hydrOXYzine  (ATARAX ) 10 MG tablet, Take 1 tablet (10 mg total) by mouth 3 (three) times daily as needed., Disp: 90 tablet, Rfl: 3   iron  polysaccharides (NIFEREX) 150 MG capsule, Take 1 capsule (150 mg total) by mouth 2 (two) times daily., Disp: 60 capsule, Rfl: 0   Misc. Devices MISC, Depends-3 XL, chuks, gloves, Disp: 1 each, Rfl: 11   omeprazole  (PRILOSEC) 40 MG capsule, Take 1 capsule (40 mg total) by mouth daily., Disp: 90 capsule, Rfl: 1   ondansetron  (ZOFRAN ) 4 MG tablet, Take 1 tablet (4 mg total) by mouth every 6 (six) hours., Disp: 12 tablet, Rfl: 0    Vitamin D , Ergocalciferol , (DRISDOL ) 1.25 MG (50000 UNIT) CAPS capsule, Take 1 capsule (50,000 Units total) by mouth 2 (two) times a week. (Patient not taking: Reported on 10/18/2024), Disp: 10 capsule, Rfl: 0   Medications ordered in this encounter:  Meds ordered this encounter  Medications   amoxicillin -clavulanate (AUGMENTIN ) 875-125 MG tablet    Sig: Take 1 tablet by mouth 2 (two) times daily.    Dispense:  14 tablet    Refill:  0    Supervising Provider:   BLAISE ALEENE [name] [[id number]]     *If you need refills on other medications prior to your next appointment, please contact your pharmacy*  Follow-Up: Call back or seek an in-person evaluation if the symptoms worsen or if the condition fails to improve as anticipated.  [facility] [phone]  Other Instructions Dental Abscess  A dental abscess is an infection around a tooth that may involve pain, swelling, and a collection of pus, as well as other symptoms. Treatment is important to help with symptoms and to prevent the infection from spreading. The general types of dental abscesses are: Pulpal abscess. This abscess may form from the inner part of the tooth (pulp). Periodontal abscess. This abscess may form from the gum. What are the causes? This condition is caused by a bacterial infection in or around the tooth. It may result from: Severe tooth decay (cavities). Trauma to the tooth, such as a broken or  chipped tooth. What increases the risk? This condition is more likely to develop in males. It is also more likely to develop in people who: Have cavities. Have severe gum disease. Eat sugary snacks between meals. Use tobacco products. Have diabetes. Have a weakened disease-fighting system (immune system). Do not brush and care for their teeth regularly. What are the signs or symptoms? Mild symptoms of this condition include: Tenderness. Bad breath. Fever. A bitter taste in the mouth. Pain in and around  the infected tooth. Moderate symptoms of this condition include: Swollen neck glands. Chills. Pus drainage. Swelling and redness around the infected tooth, in the mouth, or in the face. Severe pain in and around the infected tooth. Severe symptoms of this condition include: Difficulty swallowing. Difficulty opening the mouth. Nausea. Vomiting. How is this diagnosed? This condition is diagnosed based on: Your symptoms and your medical and dental history. An examination of the infected tooth. During the exam, your dental care provider may tap on the infected tooth. You may also need to have X-rays taken of the affected area. How is this treated? This condition is treated by getting rid of the infection. This may be done with: Antibiotic medicines. These may be used in certain situations. Antibacterial mouth rinse. Incision and drainage. This procedure is done by making an incision in the abscess to drain out the pus. Removing pus is the first priority in treating an abscess. A root canal. This may be performed to save the tooth. Your dental care provider accesses the visible part of your tooth (crown) with a drill and removes any infected pulp. Then the space is filled and sealed off. Tooth extraction. The tooth is pulled out if it cannot be saved by other treatment. You may also receive treatment for pain, such as: Acetaminophen  or NSAIDs. Gels that contain a numbing medicine. An injection to block the pain near your nerve. Follow these instructions at home: Medicines Take over-the-counter and prescription medicines only as told by your dental care provider. If you were prescribed an antibiotic, take it as told by your dental care provider. Do not stop taking the antibiotic even if you start to feel better. If you were prescribed a gel that contains a numbing medicine, use it exactly as told in the directions. Do not use these gels for children who are younger than 39 years of age. Use  an antibacterial mouth rinse as told by your dental care provider. General instructions  Gargle with a mixture of salt and water 3-4 times a day or as needed. To make salt water, completely dissolve -1 tsp (3-6 g) of salt in 1 cup (237 mL) of warm water. Eat a soft diet while your abscess is healing. Drink enough fluid to keep your urine pale yellow. Do not apply heat to the outside of your mouth. Do not use any products that contain nicotine or tobacco. These products include cigarettes, chewing tobacco, and vaping devices, such as e-cigarettes. If you need help quitting, ask your dental care provider. Keep all follow-up visits. This is important. How is this prevented?  Excellent dental home care, which includes brushing your teeth every morning and night with fluoride toothpaste. Floss one time each day. Get regularly scheduled dental cleanings. Consider having a dental sealant applied on teeth that have deep grooves to prevent cavities. Drink fluoridated water regularly. This includes most tap water. Check the label on bottled water to see if it contains fluoride. Reduce or eliminate sugary drinks. Eat healthy meals and  snacks. Wear a mouth guard or face shield to protect your teeth while playing sports. Contact a health care provider if: Your pain is worse and is not helped by medicine. You have swelling. You see pus around the tooth. You have a fever or chills. Get help right away if: Your symptoms suddenly get worse. You have a very bad headache. You have problems breathing or swallowing. You have trouble opening your mouth. You have swelling in your neck or around your eye. These symptoms may represent a serious problem that is an emergency. Do not wait to see if the symptoms will go away. Get medical help right away. Call your local emergency services (911 in the [place] Do not drive yourself to the hospital. Summary A dental abscess is a collection of pus in or around a tooth  that results from an infection. A dental abscess may result from severe tooth decay, trauma to the tooth, or severe gum disease around a tooth. Symptoms include severe pain, swelling, redness, and drainage of pus in and around the infected tooth. The first priority in treating a dental abscess is to drain out the pus. Treatment may also involve removing damage inside the tooth (root canal) or extracting the tooth. This information is not intended to replace advice given to you by your health care provider. Make sure you discuss any questions you have with your health care provider. Document Revised: 02/08/2021 Document Reviewed: 02/08/2021 Elsevier Patient Education  2024 Elsevier Inc.   If you have been instructed to have an in-person evaluation today at a local Urgent Care facility, please use the link below. It will take you to a list of all of our available [facility], including address, phone number and hours of operation. Please do not delay care.  [facility]  If you or a family member do not have a primary care provider, use the link below to schedule a visit and establish care. When you choose a [facility] primary care physician or advanced practice provider, you gain a long-term partner in health. Find a Primary Care Provider  Learn more about [facility]'s in-office and virtual care options: [facility] - Get Care Now "

## 2025-01-06 NOTE — Progress Notes (Signed)
 " Virtual Visit Consent   [name], you are scheduled for a virtual visit with a [facility] provider today. Just as with appointments in the office, your consent must be obtained to participate. Your consent will be active for this visit and any virtual visit you may have with one of our providers in the next 365 days. If you have a MyChart account, a copy of this consent can be sent to you electronically.  As this is a virtual visit, video technology does not allow for your provider to perform a traditional examination. This may limit your provider's ability to fully assess your condition. If your provider identifies any concerns that need to be evaluated in person or the need to arrange testing (such as labs, EKG, etc.), we will make arrangements to do so. Although advances in technology are sophisticated, we cannot ensure that it will always work on either your end or our end. If the connection with a video visit is poor, the visit may have to be switched to a telephone visit. With either a video or telephone visit, we are not always able to ensure that we have a secure connection.  By engaging in this virtual visit, you consent to the provision of healthcare and authorize for your insurance to be billed (if applicable) for the services provided during this visit. Depending on your insurance coverage, you may receive a charge related to this service.  I need to obtain your verbal consent now. Are you willing to proceed with your visit today? [name] has provided verbal consent on 01/06/2025 for a virtual visit (video or telephone). [name] CHRISTELLA Dickinson, PA-C  Date: 01/06/2025 2:26 PM   Virtual Visit via Video Note   I, [name], connected with  [name]  ([id number], 1991/03/26) on 01/06/25 at  2:15 PM EST by a video-enabled telemedicine application and verified that I am speaking with the correct person using two identifiers.  Location: Patient:  Virtual Visit Location Patient: Home Provider: Virtual Visit Location Provider: Home Office   I discussed the limitations of evaluation and management by telemedicine and the availability of in person appointments. The patient expressed understanding and agreed to proceed.    History of Present Illness: [name] is a 34 y.o. who identifies as a female who was assigned female at birth, and is being seen today for dental pain.  HPI: Dental Pain  This is a new problem. The current episode started 1 to 4 weeks ago (a few weeks and acutely worsening; reports back tooth on right, feels she lost a filling). The problem occurs constantly. The problem has been gradually worsening. The pain is at a severity of 8/10. The pain is severe. Associated symptoms include facial pain and thermal sensitivity (cold). Pertinent negatives include no difficulty swallowing, fever, oral bleeding or sinus pressure. She has tried acetaminophen  and NSAIDs for the symptoms. The treatment provided no relief.    Problems:  Patient Active Problem List   Diagnosis Date Noted   Symptomatic anemia 08/15/2024   Polyphagia 07/31/2023   Prediabetes 07/31/2023   Anxiety and depression 07/17/2023   DUB (dysfunctional uterine bleeding) 07/17/2023   Depression screen 07/17/2023   Unilateral primary osteoarthritis, left knee 02/11/2023   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 prior episode (HCC) 10/31/2021   OSA (obstructive sleep apnea) 10/15/2021   Depression, major, recurrent, mild 04/30/2021   Generalized anxiety disorder 04/30/2021   Knee pain, bilateral 07/14/2016  Venous stasis dermatitis of both lower extremities 05/26/2016   Plantar fasciitis, right 03/25/2016   Sebaceous cyst of left axilla 04/13/2015   Vitamin D  deficiency 03/29/2015   Allergic rhinitis 04/11/2010   Iron  deficiency anemia due to chronic blood loss 03/09/2010   Esophageal reflux 03/24/2009    MENORRHAGIA 03/17/2009   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  Resting comfortably at home.  Head is normocephalic, atraumatic.  No labored breathing.  Speech is clear and coherent with logical content.  Patient is alert and oriented at baseline.    Assessment and Plan: 1. Dental infection (Primary) - amoxicillin -clavulanate (AUGMENTIN ) 875-125 MG tablet; Take 1 tablet by mouth 2 (two) times daily.  Dispense: 14 tablet; Refill: 0  - Suspected dental infection/abscess - Augmentin  prescribed - Can use ice on outside jaw/cheek for swelling - Can alternate Tylenol  and Ibuprofen  every 4 hours as needed for pain - Discussed DenTemp putty that can be used to cover a broken tooth - Discussed oil of clove or making a clove paste using ground clove and a few drops of water to apply to affected tooth for pain management - Schedule a follow with a dentist as soon as possible (Can contact  dental clinic if underinsured or uninsured) - Seek in person evaluation if symptoms fail to improve or if they worsen   Follow Up Instructions: I discussed the assessment and treatment plan with the patient. The patient was provided an opportunity to ask questions and all were answered. The patient agreed with the plan and demonstrated an understanding of the instructions.  A copy of instructions were sent to the patient via MyChart unless otherwise noted below.    The patient was advised to call back or seek an in-person evaluation if the symptoms worsen or if the condition fails to improve as anticipated.    [name] CHRISTELLA Dickinson, PA-C     [1]  Allergies Allergen Reactions   Banana Itching    Food Itching    All fruits  [2]  Current Outpatient Medications:    amoxicillin -clavulanate (AUGMENTIN ) 875-125 MG tablet, Take 1 tablet by mouth 2 (two) times daily., Disp: 14 tablet, Rfl: 0   atomoxetine  (STRATTERA ) 40 MG capsule, Take 1 capsule (40 mg total) by mouth daily., Disp: 30 capsule, Rfl: 3   buPROPion  (WELLBUTRIN  XL) 300 MG 24 hr tablet, Take 1 tablet (300 mg total) by mouth every morning., Disp: 30 tablet, Rfl: 3   dicyclomine  (BENTYL ) 10 MG capsule, Take 1 capsule (10 mg total) by mouth 4 (four) times daily -  before meals and at bedtime., Disp: 120 capsule, Rfl: 0   furosemide  (LASIX ) 20 MG tablet, Take 1 tablet (20 mg total) by mouth daily as needed for edema., Disp: 30 tablet, Rfl: 1   hydrOXYzine  (ATARAX ) 10 MG tablet, Take 1 tablet (10 mg total) by mouth 3 (three) times daily as needed., Disp: 90 tablet, Rfl: 3   iron  polysaccharides (NIFEREX) 150 MG capsule, Take 1 capsule (150 mg total) by mouth 2 (two) times daily., Disp: 60 capsule, Rfl: 0   Misc. Devices MISC, Depends-3 XL, chuks, gloves, Disp: 1 each, Rfl: 11   omeprazole  (PRILOSEC) 40 MG capsule, Take 1 capsule (40 mg total) by mouth daily., Disp: 90 capsule, Rfl: 1   ondansetron  (ZOFRAN )  4 MG tablet, Take 1 tablet (4 mg total) by mouth every 6 (six) hours., Disp: 12 tablet, Rfl: 0   Vitamin D , Ergocalciferol , (DRISDOL ) 1.25 MG (50000 UNIT) CAPS capsule, Take 1 capsule (50,000 Units total) by mouth 2 (two) times a week. (Patient not taking: Reported on 10/18/2024), Disp: 10 capsule, Rfl: 0  "

## 2025-01-06 NOTE — Telephone Encounter (Signed)
 Noted.

## 2025-01-07 ENCOUNTER — Other Ambulatory Visit: Payer: Self-pay

## 2025-01-07 ENCOUNTER — Encounter: Payer: Self-pay | Admitting: Pharmacist

## 2025-01-10 ENCOUNTER — Other Ambulatory Visit: Payer: Self-pay

## 2025-01-27 ENCOUNTER — Ambulatory Visit: Admitting: Gastroenterology

## 2025-02-08 ENCOUNTER — Ambulatory Visit: Admitting: Family Medicine

## 2025-02-22 ENCOUNTER — Encounter: Payer: Self-pay | Admitting: Obstetrics and Gynecology

## 2025-10-18 ENCOUNTER — Ambulatory Visit: Admitting: Family Medicine
